# Patient Record
Sex: Male | Born: 1935
Health system: Southern US, Community
[De-identification: ages and names within clinical notes are randomized; demographics above are authoritative.]

## PROBLEM LIST (undated history)

## (undated) DIAGNOSIS — N183 Chronic kidney disease, stage 3 unspecified: Secondary | ICD-10-CM

## (undated) DIAGNOSIS — Z923 Personal history of irradiation: Secondary | ICD-10-CM

## (undated) DIAGNOSIS — I10 Essential (primary) hypertension: Secondary | ICD-10-CM

## (undated) DIAGNOSIS — C3490 Malignant neoplasm of unspecified part of unspecified bronchus or lung: Secondary | ICD-10-CM

## (undated) DIAGNOSIS — F0390 Unspecified dementia without behavioral disturbance: Secondary | ICD-10-CM

## (undated) DIAGNOSIS — E7800 Pure hypercholesterolemia, unspecified: Secondary | ICD-10-CM

## (undated) DIAGNOSIS — C61 Malignant neoplasm of prostate: Secondary | ICD-10-CM

## (undated) DIAGNOSIS — K5730 Diverticulosis of large intestine without perforation or abscess without bleeding: Secondary | ICD-10-CM

## (undated) HISTORY — DX: Diverticulosis of large intestine without perforation or abscess without bleeding: K57.30

## (undated) HISTORY — DX: Malignant neoplasm of prostate: C61

## (undated) HISTORY — DX: Personal history of irradiation: Z92.3

## (undated) HISTORY — DX: Essential (primary) hypertension: I10

## (undated) HISTORY — PX: REFRACTIVE SURGERY: SHX103

## (undated) HISTORY — PX: EYE SURGERY: SHX253

---

## 1999-12-27 DIAGNOSIS — C61 Malignant neoplasm of prostate: Secondary | ICD-10-CM

## 1999-12-27 HISTORY — DX: Malignant neoplasm of prostate: C61

## 2000-04-13 ENCOUNTER — Other Ambulatory Visit: Admission: RE | Admit: 2000-04-13 | Discharge: 2000-04-13 | Payer: Self-pay | Admitting: Urology

## 2000-04-27 ENCOUNTER — Ambulatory Visit (HOSPITAL_COMMUNITY): Admission: RE | Admit: 2000-04-27 | Discharge: 2000-04-27 | Payer: Self-pay | Admitting: Urology

## 2000-04-27 ENCOUNTER — Encounter: Admission: RE | Admit: 2000-04-27 | Discharge: 2000-04-27 | Payer: Self-pay | Admitting: Urology

## 2000-04-27 ENCOUNTER — Encounter: Payer: Self-pay | Admitting: Urology

## 2000-05-10 ENCOUNTER — Encounter: Admission: RE | Admit: 2000-05-10 | Discharge: 2000-08-08 | Payer: Self-pay | Admitting: Family Medicine

## 2000-07-26 ENCOUNTER — Encounter: Admission: RE | Admit: 2000-07-26 | Discharge: 2000-07-26 | Payer: Self-pay | Admitting: Urology

## 2000-07-26 ENCOUNTER — Encounter: Payer: Self-pay | Admitting: Urology

## 2000-07-26 HISTORY — PX: INSERTION PROSTATE RADIATION SEED: SUR718

## 2000-08-01 ENCOUNTER — Encounter: Payer: Self-pay | Admitting: Urology

## 2000-08-01 ENCOUNTER — Ambulatory Visit (HOSPITAL_BASED_OUTPATIENT_CLINIC_OR_DEPARTMENT_OTHER): Admission: RE | Admit: 2000-08-01 | Discharge: 2000-08-01 | Payer: Self-pay | Admitting: Urology

## 2000-08-29 ENCOUNTER — Encounter: Admission: RE | Admit: 2000-08-29 | Discharge: 2000-11-27 | Payer: Self-pay | Admitting: Radiation Oncology

## 2002-02-23 HISTORY — PX: PENILE PROSTHESIS IMPLANT: SHX240

## 2002-03-13 ENCOUNTER — Encounter: Payer: Self-pay | Admitting: Urology

## 2002-03-19 ENCOUNTER — Ambulatory Visit (HOSPITAL_COMMUNITY): Admission: RE | Admit: 2002-03-19 | Discharge: 2002-03-19 | Payer: Self-pay | Admitting: Urology

## 2003-05-15 ENCOUNTER — Encounter: Admission: RE | Admit: 2003-05-15 | Discharge: 2003-05-15 | Payer: Self-pay | Admitting: Urology

## 2003-05-15 ENCOUNTER — Encounter: Payer: Self-pay | Admitting: Urology

## 2003-05-20 ENCOUNTER — Ambulatory Visit (HOSPITAL_BASED_OUTPATIENT_CLINIC_OR_DEPARTMENT_OTHER): Admission: RE | Admit: 2003-05-20 | Discharge: 2003-05-20 | Payer: Self-pay | Admitting: Urology

## 2003-06-26 HISTORY — PX: PENILE PROSTHESIS PLACEMENT: SHX739

## 2003-06-26 HISTORY — PX: SCROTAL SURGERY: SHX2387

## 2003-06-26 HISTORY — PX: REMOVAL OF PENILE PROSTHESIS: SHX6059

## 2003-07-15 ENCOUNTER — Encounter (INDEPENDENT_AMBULATORY_CARE_PROVIDER_SITE_OTHER): Payer: Self-pay

## 2003-07-15 ENCOUNTER — Observation Stay (HOSPITAL_COMMUNITY): Admission: RE | Admit: 2003-07-15 | Discharge: 2003-07-16 | Payer: Self-pay | Admitting: Urology

## 2006-01-05 ENCOUNTER — Encounter: Payer: Self-pay | Admitting: Internal Medicine

## 2006-01-05 ENCOUNTER — Ambulatory Visit: Payer: Self-pay

## 2006-11-23 ENCOUNTER — Ambulatory Visit: Payer: Self-pay | Admitting: Internal Medicine

## 2007-01-04 ENCOUNTER — Ambulatory Visit: Payer: Self-pay | Admitting: Internal Medicine

## 2007-05-07 ENCOUNTER — Ambulatory Visit: Payer: Self-pay | Admitting: Internal Medicine

## 2007-05-07 LAB — CONVERTED CEMR LAB
ALT: 16 units/L (ref 0–40)
AST: 19 units/L (ref 0–37)
Albumin: 3.8 g/dL (ref 3.5–5.2)
Alkaline Phosphatase: 54 units/L (ref 39–117)
BUN: 9 mg/dL (ref 6–23)
Bilirubin, Direct: 0.1 mg/dL (ref 0.0–0.3)
CO2: 28 meq/L (ref 19–32)
Calcium: 9.2 mg/dL (ref 8.4–10.5)
Chloride: 113 meq/L — ABNORMAL HIGH (ref 96–112)
Creatinine, Ser: 1.1 mg/dL (ref 0.4–1.5)
GFR calc Af Amer: 86 mL/min
GFR calc non Af Amer: 71 mL/min
Glucose, Bld: 93 mg/dL (ref 70–99)
Potassium: 4.4 meq/L (ref 3.5–5.1)
Sodium: 144 meq/L (ref 135–145)
Total Bilirubin: 0.6 mg/dL (ref 0.3–1.2)
Total Protein: 7.1 g/dL (ref 6.0–8.3)

## 2007-07-25 DIAGNOSIS — Z8546 Personal history of malignant neoplasm of prostate: Secondary | ICD-10-CM | POA: Insufficient documentation

## 2007-07-25 DIAGNOSIS — I10 Essential (primary) hypertension: Secondary | ICD-10-CM | POA: Insufficient documentation

## 2007-07-25 DIAGNOSIS — K5730 Diverticulosis of large intestine without perforation or abscess without bleeding: Secondary | ICD-10-CM | POA: Insufficient documentation

## 2007-07-25 HISTORY — DX: Diverticulosis of large intestine without perforation or abscess without bleeding: K57.30

## 2007-08-07 ENCOUNTER — Ambulatory Visit: Payer: Self-pay | Admitting: Internal Medicine

## 2007-08-07 DIAGNOSIS — B356 Tinea cruris: Secondary | ICD-10-CM | POA: Insufficient documentation

## 2007-11-06 ENCOUNTER — Ambulatory Visit: Payer: Self-pay | Admitting: Internal Medicine

## 2008-03-10 ENCOUNTER — Encounter: Payer: Self-pay | Admitting: Internal Medicine

## 2008-07-14 ENCOUNTER — Telehealth: Payer: Self-pay | Admitting: Internal Medicine

## 2008-07-23 ENCOUNTER — Ambulatory Visit: Payer: Self-pay | Admitting: Internal Medicine

## 2008-08-04 ENCOUNTER — Ambulatory Visit: Payer: Self-pay | Admitting: Internal Medicine

## 2008-08-04 ENCOUNTER — Telehealth: Payer: Self-pay | Admitting: Internal Medicine

## 2008-08-04 DIAGNOSIS — M109 Gout, unspecified: Secondary | ICD-10-CM

## 2008-09-08 ENCOUNTER — Telehealth: Payer: Self-pay | Admitting: Internal Medicine

## 2008-10-07 ENCOUNTER — Ambulatory Visit: Payer: Self-pay | Admitting: Internal Medicine

## 2009-01-07 ENCOUNTER — Ambulatory Visit: Payer: Self-pay | Admitting: Internal Medicine

## 2009-09-15 ENCOUNTER — Encounter: Payer: Self-pay | Admitting: Internal Medicine

## 2009-12-11 ENCOUNTER — Encounter (INDEPENDENT_AMBULATORY_CARE_PROVIDER_SITE_OTHER): Payer: Self-pay | Admitting: *Deleted

## 2010-02-02 ENCOUNTER — Ambulatory Visit: Payer: Self-pay | Admitting: Internal Medicine

## 2010-02-02 DIAGNOSIS — H9209 Otalgia, unspecified ear: Secondary | ICD-10-CM | POA: Insufficient documentation

## 2010-04-05 ENCOUNTER — Ambulatory Visit: Payer: Self-pay | Admitting: Internal Medicine

## 2010-08-12 ENCOUNTER — Ambulatory Visit: Payer: Self-pay | Admitting: Internal Medicine

## 2010-08-12 DIAGNOSIS — N529 Male erectile dysfunction, unspecified: Secondary | ICD-10-CM | POA: Insufficient documentation

## 2010-12-09 ENCOUNTER — Ambulatory Visit: Payer: Self-pay | Admitting: Internal Medicine

## 2010-12-09 DIAGNOSIS — D234 Other benign neoplasm of skin of scalp and neck: Secondary | ICD-10-CM

## 2010-12-09 DIAGNOSIS — M109 Gout, unspecified: Secondary | ICD-10-CM

## 2010-12-09 LAB — CONVERTED CEMR LAB
BUN: 9 mg/dL (ref 6–23)
Basophils Absolute: 0 10*3/uL (ref 0.0–0.1)
Basophils Relative: 0.7 % (ref 0.0–3.0)
CO2: 27 meq/L (ref 19–32)
Calcium: 9.1 mg/dL (ref 8.4–10.5)
Chloride: 110 meq/L (ref 96–112)
Creatinine, Ser: 1 mg/dL (ref 0.4–1.5)
Eosinophils Absolute: 0.2 10*3/uL (ref 0.0–0.7)
Eosinophils Relative: 2.2 % (ref 0.0–5.0)
GFR calc non Af Amer: 90.18 mL/min (ref >60.00)
Glucose, Bld: 84 mg/dL (ref 70–99)
HCT: 40.9 % (ref 39.0–52.0)
Hemoglobin: 13.7 g/dL (ref 13.0–17.0)
Lymphocytes Relative: 32.4 % (ref 12.0–46.0)
Lymphs Abs: 2.2 10*3/uL (ref 0.7–4.0)
MCHC: 33.5 g/dL (ref 30.0–36.0)
MCV: 88.2 fL (ref 78.0–100.0)
Monocytes Absolute: 0.4 10*3/uL (ref 0.1–1.0)
Monocytes Relative: 5.9 % (ref 3.0–12.0)
Neutro Abs: 4 10*3/uL (ref 1.4–7.7)
Neutrophils Relative %: 58.8 % (ref 43.0–77.0)
Platelets: 212 10*3/uL (ref 150.0–400.0)
Potassium: 4.5 meq/L (ref 3.5–5.1)
RBC: 4.64 M/uL (ref 4.22–5.81)
RDW: 15.5 % — ABNORMAL HIGH (ref 11.5–14.6)
Sodium: 143 meq/L (ref 135–145)
Uric Acid, Serum: 6.8 mg/dL (ref 4.0–7.8)
WBC: 6.7 10*3/uL (ref 4.5–10.5)

## 2011-01-10 ENCOUNTER — Telehealth: Payer: Self-pay | Admitting: Internal Medicine

## 2011-01-10 DIAGNOSIS — R011 Cardiac murmur, unspecified: Secondary | ICD-10-CM | POA: Insufficient documentation

## 2011-01-18 ENCOUNTER — Telehealth: Payer: Self-pay | Admitting: Internal Medicine

## 2011-01-18 ENCOUNTER — Ambulatory Visit (HOSPITAL_COMMUNITY)
Admission: RE | Admit: 2011-01-18 | Discharge: 2011-01-18 | Payer: Self-pay | Source: Home / Self Care | Attending: Internal Medicine | Admitting: Internal Medicine

## 2011-01-18 ENCOUNTER — Ambulatory Visit: Admission: RE | Admit: 2011-01-18 | Discharge: 2011-01-18 | Payer: Self-pay | Source: Home / Self Care

## 2011-01-25 NOTE — Assessment & Plan Note (Signed)
Summary: 3 month rov/njr---PTS WIFE [facility] // RS   Vital Signs:  Patient profile:   75 year old male Height:      69 inches Weight:      230 pounds BMI:     34.09 Temp:     98.2 degrees F oral Pulse rate:   76 / minute Resp:     14 per minute BP sitting:   140 / 80  (left arm)  Vitals Entered By: Willy Eddy, LPN (August 12, 2010 10:12 AM)  Nutrition Counseling: Patient's BMI is greater than 25 and therefore counseled on weight management options. CC: roa- has lost 6 pounds, Hypertension Management Is Patient Diabetic? No  Does patient need assistance? Functional Status Self care Ambulation Normal   Primary Care Provider:  Stacie Glaze MD  CC:  roa- has lost 6 pounds and Hypertension Management.  History of Present Illness: weight down due to diet has been on a diet suppliemnt with aloe and protein and has has helped [employer] blood pressure monitering no chest pain of constipation increased pain in the left knee and has noted an varicose vein he has not been walking   Hypertension History:      He denies headache, chest pain, palpitations, dyspnea with exertion, orthopnea, PND, peripheral edema, visual symptoms, neurologic problems, syncope, and side effects from treatment.        Positive major cardiovascular risk factors include male age 58 years old or older and hypertension.  Negative major cardiovascular risk factors include non-tobacco-user status.     Preventive Screening-Counseling & Management  Alcohol-Tobacco     Smoking Status: quit     Tobacco Counseling: to remain off tobacco products  Problems Prior to Update: 1)  Organic Impotence  (ICD-607.84) 2)  Ear Pain, Left  (ICD-388.70) 3)  Gouty Arthropathy  (ICD-274.0) 4)  Dermatophytosis, Groin  (ICD-110.3) 5)  Family History of Alcoholism/addiction  (ICD-V61.41) 6)  Hypertension  (ICD-401.9) 7)  Diverticulosis, Colon  (ICD-562.10) 8)  Prostate Cancer, Hx of  (ICD-V10.46)  Current  Problems (verified): 1)  Ear Pain, Left  (ICD-388.70) 2)  Gouty Arthropathy  (ICD-274.0) 3)  Dermatophytosis, Groin  (ICD-110.3) 4)  Family History of Alcoholism/addiction  (ICD-V61.41) 5)  Hypertension  (ICD-401.9) 6)  Diverticulosis, Colon  (ICD-562.10) 7)  Prostate Cancer, Hx of  (ICD-V10.46)  Medications Prior to Update: 1)  Vesicare 10 Mg Tabs (Solifenacin Succinate) .Marland Kitchen.. 1 Once Daily 2)  Azor 5-40 Mg Tabs (Amlodipine-Olmesartan) .... One By Mouth Daily  Current Medications (verified): 1)  Vesicare 10 Mg Tabs (Solifenacin Succinate) .Marland Kitchen.. 1 Once Daily On and Off 2)  Azor 5-40 Mg Tabs (Amlodipine-Olmesartan) .... One By Mouth Daily  Allergies (verified): No Known Drug Allergies  Past History:  Family History: Last updated: 08/07/2007 father cirrhosis Family History of Alcoholism/Addiction mother diesd at 65  Social History: Last updated: 08/07/2007 Occupation: Married Former Smoker quit greater than 20 Alcohol use-no Drug use-no  Risk Factors: Smoking Status: quit (08/12/2010)  Past medical, surgical, family and social histories (including risk factors) reviewed, and no changes noted (except as noted below).  Past Medical History: Reviewed history from 11/06/2007 and no changes required. Prostate cancer, hx of Diverticulosis, colon  extensive Hypertension seeing Dr Logan Bores for hx of prostate CA  Past Surgical History: Reviewed history from 08/07/2007 and no changes required. Prostatectomy seeds pump implant  Family History: Reviewed history from 08/07/2007 and no changes required. father cirrhosis Family History of Alcoholism/Addiction mother diesd at 18  Social History:  Reviewed history from 08/07/2007 and no changes required. Occupation: Married Former Smoker quit greater than 20 Alcohol use-no Drug use-no  Review of Systems  The patient denies anorexia, fever, weight loss, weight gain, vision loss, decreased hearing, hoarseness, chest pain,  syncope, dyspnea on exertion, peripheral edema, prolonged cough, headaches, hemoptysis, abdominal pain, melena, hematochezia, severe indigestion/heartburn, hematuria, incontinence, genital sores, muscle weakness, suspicious skin lesions, transient blindness, difficulty walking, depression, unusual weight change, abnormal bleeding, enlarged lymph nodes, angioedema, and breast masses.    Physical Exam  General:  alert and overweight-appearing.   Head:  normocephalic and atraumatic.   Eyes:  pupils equal and pupils round.   Ears:  R ear normal and L ear normal.   Nose:  no external deformity and no nasal discharge.   Mouth:  pharynx pink and moist and no erythema.   Neck:  No deformities, masses, or tenderness noted no JVD. Lungs:  normal respiratory effort and no wheezes.   Heart:  normal rate and regular rhythm.   Abdomen:  soft and distended.     Impression & Recommendations:  Problem # 1:  HYPERTENSION (ICD-401.9)  His updated medication list for this problem includes:    Azor 5-40 Mg Tabs (Amlodipine-olmesartan) ..... One by mouth daily  BP today: 140/80 Prior BP: 140/78 (04/05/2010)  Prior 10 Yr Risk Heart Disease: Not enough information (11/06/2007)  Labs Reviewed: K+: 4.4 (05/07/2007) Creat: : 1.1 (05/07/2007)     Problem # 2:  GOUTY ARTHROPATHY (ICD-274.0)  Elevate extremity; warm compresses, symptomatic relief and medication as directed.   Problem # 3:  DIVERTICULOSIS, COLON (ICD-562.10) stable  Problem # 4:  ORGANIC IMPOTENCE (ICD-607.84)  due to surgery seeing Dr Cassell Smiles  Discussed proper use of medications, as well as side effects.   Complete Medication List: 1)  Vesicare 10 Mg Tabs (Solifenacin succinate) .Marland Kitchen.. 1 once daily on and off 2)  Azor 5-40 Mg Tabs (Amlodipine-olmesartan) .... One by mouth daily  Hypertension Assessment/Plan:      The patient's hypertensive risk group is category B: At least one risk factor (excluding diabetes) with no target  organ damage.  Today's blood pressure is 140/80.  His blood pressure goal is < 140/90.  Patient Instructions: 1)  Please schedule a follow-up appointment in 4 months.

## 2011-01-25 NOTE — Assessment & Plan Note (Signed)
Summary: partial loss of hearing in lft ear/?inf/drainage/cjr   Vital Signs:  Patient profile:   75 year old Jerry [name] Height:      69 inches Weight:      237 pounds BMI:     35.13 Temp:     98.2 degrees F oral Pulse rate:   76 / minute Resp:     14 per minute BP sitting:   140 / 82  (left arm)  Vitals Entered By: Willy Eddy, LPN (February  Jerry, 2011 3:55 PM) CC: c/o earache, Hypertension Management   CC:  c/o earache and Hypertension Management.  History of Present Illness: The pt has seen Dr Ermalene Postin and was given a trial  of a medication for BPH  ( vesicare) stoped due to the sign effect of blurred vision   Hypertension History:      He denies headache, chest pain, palpitations, dyspnea with exertion, orthopnea, PND, peripheral edema, visual symptoms, neurologic problems, syncope, and side effects from treatment.        Positive major cardiovascular risk factors include Jerry [name] age 6 years old or older and hypertension.  Negative major cardiovascular risk factors include non-tobacco-user status.     Preventive Screening-Counseling & Management  Alcohol-Tobacco     Smoking Status: quit  Problems Prior to Update: 1)  Gouty Arthropathy  (ICD-274.0) 2)  Dermatophytosis, Groin  (ICD-110.3) 3)  Family History of Alcoholism/addiction  (ICD-V61.41) 4)  Hypertension  (ICD-401.9) 5)  Diverticulosis, Colon  (ICD-562.10) 6)  Prostate Cancer, Hx of  (ICD-V10.46)  Current Problems (verified): 1)  Gouty Arthropathy  (ICD-274.0) 2)  Dermatophytosis, Groin  (ICD-110.3) 3)  Family History of Alcoholism/addiction  (ICD-V61.41) 4)  Hypertension  (ICD-401.9) 5)  Diverticulosis, Colon  (ICD-562.10) 6)  Prostate Cancer, Hx of  (ICD-V10.46)  Medications Prior to Update: 1)  Azor 5-20 Mg  Tabs (Amlodipine-Olmesartan) .... Once Daily  Current Medications (verified): 1)  Azor 5-20 Mg  Tabs (Amlodipine-Olmesartan) .... Once Daily 2)  Vesicare 10 Mg Tabs (Solifenacin Succinate) .Marland Kitchen.. 1  Once Daily  Allergies (verified): No Known Drug Allergies  Past History:  Family History: Last updated: 08/07/2007 father cirrhosis Family History of Alcoholism/Addiction mother diesd at 6  Social History: Last updated: 08/07/2007 Occupation: Married Former Smoker quit greater than 20 Alcohol use-no Drug use-no  Risk Factors: Smoking Status: quit (02/02/2010)  Past medical, surgical, family and social histories (including risk factors) reviewed, and no changes noted (except as noted below).  Past Medical History: Reviewed history from 11/06/2007 and no changes required. Prostate cancer, hx of Diverticulosis, colon  extensive Hypertension seeing Dr Logan Bores for hx of prostate CA  Past Surgical History: Reviewed history from 08/07/2007 and no changes required. Prostatectomy seeds pump implant  Family History: Reviewed history from 08/07/2007 and no changes required. father cirrhosis Family History of Alcoholism/Addiction mother diesd at 47  Social History: Reviewed history from 08/07/2007 and no changes required. Occupation: Married Former Smoker quit greater than 20 Alcohol use-no Drug use-no  Review of Systems  The patient denies anorexia, fever, weight loss, weight gain, vision loss, decreased hearing, hoarseness, chest pain, syncope, dyspnea on exertion, peripheral edema, prolonged cough, headaches, hemoptysis, abdominal pain, melena, hematochezia, severe indigestion/heartburn, hematuria, incontinence, genital sores, muscle weakness, suspicious skin lesions, transient blindness, difficulty walking, depression, unusual weight change, abnormal bleeding, enlarged lymph nodes, angioedema, and breast masses.         frequency  Physical Exam  General:  Well-developed,well-nourished,in no acute distress; alert,appropriate and cooperative throughout examination Head:  normocephalic.  atraumatic.   Eyes:  pupils equal and pupils round.   Ears:  R ear normal and  L ear normal.   Nose:  no external deformity and no nasal discharge.   Mouth:  pharynx pink and moist and no erythema.   Neck:  No deformities, masses, or tenderness noted no JVD. Lungs:  normal respiratory effort and no wheezes.  normal respiratory effort, no wheezes, and L fremitus.   Heart:  normal rate, regular rhythm, and Grade  1 /6 systolic ejection murmur.   Abdomen:  soft, non-tender, and distended.     Impression & Recommendations:  Problem # 1:  EAR PAIN, LEFT (ICD-388.70)  Discussed symptom control.   His updated medication list for this problem includes:    Cipro Hc 0.2-1 % Susp (Ciprofloxacin-hydrocortisone) .Marland KitchenMarland KitchenMarland KitchenMarland Kitchen 4 drops in the left ear two times a day or 10 days  Orders: Prescription Created Electronically [id number])  Problem # 2:  HYPERTENSION (ICD-401.9)  The following medications were removed from the medication list:    Azor 5-20 Mg Tabs (Amlodipine-olmesartan) ..... Once daily His updated medication list for this problem includes:    Azor 5-40 Mg Tabs (Amlodipine-olmesartan) ..... One by mouth daily  BP today: 140/82 Prior BP: 138/78 (01/07/2009)  Prior 10 Yr Risk Heart Disease: Not enough information (11/06/2007)  Labs Reviewed: K+: 4.4 (05/07/2007) Creat: : 1.1 (05/07/2007)     Complete Medication List: 1)  Vesicare 10 Mg Tabs (Solifenacin succinate) .Marland Kitchen.. 1 once daily 2)  Cipro Hc 0.2-1 % Susp (Ciprofloxacin-hydrocortisone) .... 4 drops in the left ear two times a day or 10 days 3)  Azor 5-40 Mg Tabs (Amlodipine-olmesartan) .... One by mouth daily  Hypertension Assessment/Plan:      The patient's hypertensive risk group is category B: At least one risk factor (excluding diabetes) with no target organ damage.  Today's blood pressure is 140/82.  His blood pressure goal is < 140/90.  Patient Instructions: 1)  Please schedule a follow-up appointment in 2 months. Prescriptions: CIPRO HC 0.2-1 % SUSP (CIPROFLOXACIN-HYDROCORTISONE) 4 drops in the left ear  two times a day or 10 days  #1 vial x 0   Entered and Authorized by:   Stacie Glaze MD   Signed by:   Stacie Glaze MD on 02/02/2010   Method used:   Electronically to        [facility] [id number] (retail)       [address] [place], [place]  [address]       Ph: [phone]       Fax: [phone]   RxID:   [id number]

## 2011-01-25 NOTE — Assessment & Plan Note (Signed)
Summary: 2 mo rov/mm   Vital Signs:  Patient profile:   75 year old male Height:      69 inches Weight:      236 pounds BMI:     34.98 Temp:     98.2 degrees F oral Pulse rate:   76 / minute Pulse rhythm:   regular Resp:     14 per minute BP sitting:   140 / 78  (left arm)  Vitals Entered By: Willy Eddy, LPN (April 05, 2010 12:08 PM)  Nutrition Counseling: Patient's BMI is greater than 25 and therefore counseled on weight management options. CC: roa-bo check- vesicare helped some pt states, Hypertension Management   CC:  roa-bo check- vesicare helped some pt states and Hypertension Management.  History of Present Illness: follow up for htn  Hypertension History:      He denies headache, chest pain, palpitations, dyspnea with exertion, orthopnea, PND, peripheral edema, visual symptoms, neurologic problems, syncope, and side effects from treatment.        Positive major cardiovascular risk factors include male age 87 years old or older and hypertension.  Negative major cardiovascular risk factors include non-tobacco-user status.     Preventive Screening-Counseling & Management  Alcohol-Tobacco     Smoking Status: quit  Current Medications (verified): 1)  Vesicare 10 Mg Tabs (Solifenacin Succinate) .Marland Kitchen.. 1 Once Daily 2)  Azor 5-40 Mg Tabs (Amlodipine-Olmesartan) .... One By Mouth Daily  Allergies (verified): No Known Drug Allergies  Past History:  Family History: Last updated: 08/07/2007 father cirrhosis Family History of Alcoholism/Addiction mother diesd at 56  Social History: Last updated: 08/07/2007 Occupation: Married Former Smoker quit greater than 20 Alcohol use-no Drug use-no  Risk Factors: Smoking Status: quit (04/05/2010)  Past medical, surgical, family and social histories (including risk factors) reviewed, and no changes noted (except as noted below).  Past Medical History: Reviewed history from 11/06/2007 and no changes  required. Prostate cancer, hx of Diverticulosis, colon  extensive Hypertension seeing Dr Logan Bores for hx of prostate CA  Past Surgical History: Reviewed history from 08/07/2007 and no changes required. Prostatectomy seeds pump implant  Family History: Reviewed history from 08/07/2007 and no changes required. father cirrhosis Family History of Alcoholism/Addiction mother diesd at 46  Social History: Reviewed history from 08/07/2007 and no changes required. Occupation: Married Former Smoker quit greater than 20 Alcohol use-no Drug use-no  Review of Systems       The patient complains of weight gain.  The patient denies anorexia, fever, weight loss, vision loss, decreased hearing, hoarseness, chest pain, syncope, dyspnea on exertion, peripheral edema, prolonged cough, headaches, hemoptysis, abdominal pain, melena, hematochezia, severe indigestion/heartburn, hematuria, incontinence, genital sores, muscle weakness, suspicious skin lesions, transient blindness, difficulty walking, depression, unusual weight change, abnormal bleeding, enlarged lymph nodes, angioedema, and breast masses.    Physical Exam  General:  alert and overweight-appearing.   Head:  normocephalic and atraumatic.   Eyes:  pupils equal and pupils round.   Ears:  R ear normal and L ear normal.   Nose:  no external deformity and no nasal discharge.   Lungs:  normal respiratory effort and no wheezes.   Heart:  normal rate and regular rhythm.   Abdomen:  soft and distended.   Msk:  no joint tenderness and no joint swelling.   Neurologic:  alert & oriented X3 and gait normal.     Impression & Recommendations:  Problem # 1:  HYPERTENSION (ICD-401.9) to help him with medications sample were given  blood presure is in well controll His updated medication list for this problem includes:    Azor 5-40 Mg Tabs (Amlodipine-olmesartan) ..... One by mouth daily  BP today: 140/78 Prior BP: 140/82 (02/02/2010)  Prior 10  Yr Risk Heart Disease: Not enough information (11/06/2007)  Labs Reviewed: K+: 4.4 (05/07/2007) Creat: : 1.1 (05/07/2007)     Problem # 2:  EAR PAIN, LEFT (ICD-388.70) the symptoms have resolved The following medications were removed from the medication list:    Cipro Hc 0.2-1 % Susp (Ciprofloxacin-hydrocortisone) .Marland KitchenMarland KitchenMarland KitchenMarland Kitchen 4 drops in the left ear two times a day or 10 days  Discussed symptom control.   Problem # 3:  PROSTATE CANCER, HX OF (ICD-V10.46) he does not want follow up psa's  Complete Medication List: 1)  Vesicare 10 Mg Tabs (Solifenacin succinate) .Marland Kitchen.. 1 once daily 2)  Azor 5-40 Mg Tabs (Amlodipine-olmesartan) .... One by mouth daily  Hypertension Assessment/Plan:      The patient's hypertensive risk group is category B: At least one risk factor (excluding diabetes) with no target organ damage.  Today's blood pressure is 140/78.  His blood pressure goal is < 140/90.  Patient Instructions: 1)  Please schedule a follow-up appointment in 3 months.
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
## 2011-01-27 NOTE — Assessment & Plan Note (Signed)
Summary: 4 month fup//ccm   Vital Signs:  Patient profile:   75 year old male Height:      69 inches Weight:      232 pounds BMI:     34.38 Temp:     98.2 degrees F oral Pulse rate:   76 / minute Resp:     14 per minute BP sitting:   144 / 80  (left arm)  Vitals Entered By: Willy Eddy, LPN (December 09, 2010 9:50 AM) CC: roa Is Patient Diabetic? No   CC:  roa.  Preventive Screening-Counseling & Management  Alcohol-Tobacco     Smoking Status: quit     Tobacco Counseling: to remain off tobacco products  Current Problems (verified): 1)  Organic Impotence  (ICD-607.84) 2)  Ear Pain, Left  (ICD-388.70) 3)  Gouty Arthropathy  (ICD-274.0) 4)  Dermatophytosis, Groin  (ICD-110.3) 5)  Family History of Alcoholism/addiction  (ICD-V61.41) 6)  Hypertension  (ICD-401.9) 7)  Diverticulosis, Colon  (ICD-562.10) 8)  Prostate Cancer, Hx of  (ICD-V10.46)  Current Medications (verified): 1)  Vesicare 10 Mg Tabs (Solifenacin Succinate) .Marland Kitchen.. 1 Once Daily On and Off 2)  Azor 5-40 Mg Tabs (Amlodipine-Olmesartan) .... One By Mouth Daily  Allergies (verified): No Known Drug Allergies  Review of Systems       Flu Vaccine Consent Questions     Do you have a history of severe allergic reactions to this vaccine? no    Any prior history of allergic reactions to egg and/or gelatin? no    Do you have a sensitivity to the preservative Thimersol? no    Do you have a past history of Guillan-Barre Syndrome? no    Do you currently have an acute febrile illness? no    Have you ever had a severe reaction to latex? no    Vaccine information given and explained to patient? yes    Are you currently pregnant? no    Lot Number:[id number]   Exp Date:06/25/2011   Site Given  Left Deltoid IM   Physical Exam  General:  alert and overweight-appearing.   Head:  normocephalic and atraumatic.   Eyes:  pupils equal and pupils round.   Ears:  R ear normal and L ear normal.   Mouth:  pharynx pink and  moist and no erythema.   Neck:  No deformities, masses, or tenderness noted no JVD. Lungs:  normal respiratory effort and no wheezes.   Heart:  normal rate and regular rhythm.   Abdomen:  soft and distended.   Msk:  no joint tenderness and no joint swelling.   Extremities:  trace left pedal edema and trace right pedal edema.   Neurologic:  alert & oriented X3 and gait normal.  RUE hyperreflexia.     Impression & Recommendations:  Problem # 1:  ORGANIC IMPOTENCE (ICD-607.84)  Discussed proper use of medications, as well as side effects.   Problem # 2:  GOUTY ARTHROPATHY (ICD-274.0) rare gout issues promarily in the right knee no foot ot toe pain noted Elevate extremity; warm compresses, symptomatic relief and medication as directed.   Problem # 3:  HYPERTENSION (ICD-401.9)  stable His updated medication list for this problem includes:    Azor 5-40 Mg Tabs (Amlodipine-olmesartan) ..... One by mouth daily  BP today: 144/80 repeat 1 Prior BP: 140/80 (08/12/2010)  Prior 10 Yr Risk Heart Disease: Not enough information (11/06/2007)  Labs Reviewed: K+: 4.4 (05/07/2007) Creat: : 1.1 (05/07/2007)     Orders: Venipuncture (72536) TLB-BMP (  Basic Metabolic Panel-BMET) (80048-METABOL)  Problem # 4:  DERMATOPHYTOSIS, GROIN (ICD-110.3) lamisol otc  Problem # 5:  HYPERTENSION (ICD-401.9)  His updated medication list for this problem includes:    Azor 5-40 Mg Tabs (Amlodipine-olmesartan) ..... One by mouth daily  Orders: Venipuncture (11914) TLB-BMP (Basic Metabolic Panel-BMET) (80048-METABOL)  Complete Medication List: 1)  Vesicare 10 Mg Tabs (Solifenacin succinate) .Marland Kitchen.. 1 once daily on and off 2)  Azor 5-40 Mg Tabs (Amlodipine-olmesartan) .... One by mouth daily  Other Orders: Flu Vaccine 42yrs + MEDICARE PATIENTS (N8295) Administration Flu vaccine - MCR (G0008) TLB-CBC Platelet - w/Differential (85025-CBCD) TLB-Uric Acid, Blood (84550-URIC) Dermatology Referral  (Derma)  Patient Instructions: 1)  for rashes lamisil cream or lotion 2)  Please schedule a follow-up appointment in 4 months.   Orders Added: 1)  Flu Vaccine 72yrs + MEDICARE PATIENTS [Q2039] 2)  Administration Flu vaccine - MCR [G0008] 3)  Est. Patient Level IV [62130] 4)  Venipuncture [36415] 5)  TLB-BMP (Basic Metabolic Panel-BMET) [80048-METABOL] 6)  TLB-CBC Platelet - w/Differential [85025-CBCD] 7)  TLB-Uric Acid, Blood [84550-URIC] 8)  Dermatology Referral [Derma]

## 2011-01-27 NOTE — Progress Notes (Signed)
Summary: Pt needs a letter written for his job re: pts heart murmur  Phone Note Call from Patient Call back at [phone] cell   Caller: Patient Reason for Call: Acute Illness Summary of Call: Pt just had a dot cpx done by [facility] on [address] Need to get a letter from Dr Lovell Sheehan, stating that pts heart murmur will not interfere with pts job. Pt has had a heart murmur for many years and has never had any problems. Pls advise.  Initial call taken by: Lucy Antigua,  January 10, 2011 10:56 AM  Follow-up for Phone Call        per dr Esperanza Heir- need echo first  left message on machine order sent Follow-up by: Willy Eddy, LPN,  January 10, 2011 11:44 AM  New Problems: CARDIAC MURMUR (ICD-785.2)   New Problems: CARDIAC MURMUR (ICD-785.2)

## 2011-05-13 NOTE — Op Note (Signed)
[facility]  Patient:    Jerry [name], Jerry [name]                     MRN: [id number] Proc. Date: 08/01/00 Adm. Date:  [id number] Attending:  Londell Moh CC:         Maryln Gottron, M.D.             Stacie Glaze, M.D. [facility]                           Operative Report  SERVICE:  Urology.  PREOPERATIVE DIAGNOSIS:  Carcinoma of the prostate.  POSTOPERATIVE DIAGNOSIS:   Carcinoma of the prostate.  PROCEDURE:  Radioactive seed implantation, cystoscopy.  SURGEON:  Dr. Logan Bores.  RADIATION ONCOLOGIST:  Dr. Dayton Scrape.  ANESTHESIA:  General.  COMPLICATIONS:  None.  DRAINS:  16 French Foley catheter.  BRIEF HISTORY:  A 75 year old male had an elevated PSA and underwent a prostatic ultrasound guided biopsy and was found to have carcinoma of the prostate. He was advised as to the various treatment options and elected combination therapy with external beam radiation therapy followed by seed implantation. He has completed his course of external beam without complications and is scheduled to undergo seed implantation. The patient understands the risks and benefits of the procedure and gave full and informed consent. Specifically, we did discuss the possible side effects including urgency, frequency, dysuria, hematuria, radiation proctitis, difficulty with voiding and increasing problems with erections.  DESCRIPTION OF PROCEDURE:  After successful induction of general anesthesia, the patient was placed in the dorsal lithotomy position and prepped with Betadine and draped in the usual sterile fashion. The bed was adjusted so that the images obtained with the transrectal ultrasound were identical to those obtained in the preplanning session. Once the stabilized system was attached to the bed, the patient was reimaged and it appeared that he was in appropriate position. ______ were placed. The patient then underwent standard seed implantation following the  preplanned template. The patient then underwent a cystoscopic examination and there was no evidence of any seeds within the bladder. There was no evidence of an injury to the bladder or prostatic mucosa. The patient had a new Foley catheter inserted. This was placed to straight drain. The patient tolerated the procedure well and was taken to the recovery room in good condition to be sent home with a leg bag with prescriptions for Lorcet plus, Pyridium and Septra DS and return to see [place] in the office in 24 hours and will take his Foley catheter out at that time. He will follow-up in 1 week and in several weeks his first PSA check will be obtained. DD:  08/01/00 TD:  08/02/00 Job: [id number]

## 2011-05-13 NOTE — Op Note (Signed)
[facility]  Patient:    LYNDON, CHAPEL Visit Number: [id number] MRN: [id number]          Service Type: DSU Location: DAY Attending Physician:  Londell Moh Dictated by:   Jamison Neighbor, M.D. Proc. Date: 03/19/02 Admit Date:  03/19/2002   CC:         [facility]   Operative Report  PREOPERATIVE DIAGNOSIS:  Organic erectile dysfunction.  POSTOPERATIVE DIAGNOSIS:  Organic erectile dysfunction.  PROCEDURE:  Insertion of Mentor three piece inflatable penile prosthesis.  SURGEON:  Jamison Neighbor, M.D.  ANESTHESIA:  General.  COMPLICATIONS:  None.  DRAINS:  None.  BRIEF HISTORY:  This 75 year old male has known erectile dysfunction felt to be due primarily to vascular disease.  The patient recently had radiation seed implantation performed for carcinoma of the prostate which certainly did not help the vascular flow to his penis.  The patient has been appraised to the various treatment options.  He has elected to undergo insertion of a penile prosthesis.  He has looked at various devices available from both Mentor and [employer], and he elected to have a three piece device inserted.  The patient selected the Mentor Titan three-piece inflatable device.  He understands the risks and benefits of the procedure including the possibility of erosion, infection, mechanical breakdown, or injury to other organs including the bladder and vascular structures.  The patient gave full and informed consent.  DESCRIPTION OF PROCEDURE:  After the successful induction of general anesthesia, the patient was placed in the supine position.  Because of the patients large abdominal wall, tape was used to pull the fat off the top of the penis to improve exposure to the infrapubic area.  He was then placed in a modified Trendelenburg position with flexing of the bed in order to improve exposure.  The patient had a full 10 minute  prep with scrub and paint.  A curvilinear infrapubic incision was made, and this was carried down through the Scarpas fascia.  Blunt dissection was used to expose the corporal bodies. A Foley catheter was placed to help identify the urethra.  Holding sutures of 2-0 Vicryl were placed, and corporotomies were made bilaterally.  The corpora was dilated up to #14 with [facility] dilators on each side.  The corpora was measured, and it was felt that an 18 cm device with approximately 3 cm of rear tip would be appropriate.  It appeared that 21 cm was the entire penile length.  The patient had sutures placed in the corporotomy on each side, and these were clamped in preparation for eventual closure.  While the implant cell was being prepared, the reservoir pocket was created.  The fascia overlying the rectus was identified.  An opening was made in the rectus sheath.  Blunt dissection was used to make an opening just behind the pubic bone.  Blunt dissection was used to create a space.  The 75 cm reservoir was placed into this space and then overfilled to a total of 120.  This was done in order to ensure that there would be adequate space for the reservoir.  The device was then inserted using the [facility] tool.  This was an 18 cm device with a 3 cm rear tip.  When this was inflated, there was good filling out at least to the mid glans area, and there was no wrinkling at the corporotomy site. The entire area was then irrigated with antibiotic solution, and the  corporotomy was closed with the previously-placed sutures.  The pump mechanism was then placed within a pocket in the right scrotum and held in place with a Babcock clamp.  The reservoir was then emptied down to a total of 85 cc.  It was felt that the patient required slightly more than the standard 75 cc since he took at least 45 cc or so with filling of the tubes.  The True/Lok system was then used to make the connections.  The device was then  cycled two more times, and it appeared to function normally with a good straight perfectly functional penis.  The entire area was irrigated one more time with antibiotic solution.  The patient was then closed with two layers of 2-0 Vicryl and surgical clips.  A dressing was applied to the infrapubic incision.  A penile dressing of dry gauze and Coban was placed.  The Foley catheter was removed in the recovery room.  The patient will be discharged home with a prescription for Tylox and Keflex if he voids.  If he does not void, he will be sent home for 24 hours with a Foley catheter. Dictated by:   Jamison Neighbor, M.D. Attending Physician:  Londell Moh DD:  03/19/02 TD:  03/19/02 Job: [id number]

## 2011-05-13 NOTE — Op Note (Signed)
Jerry [name], Jerry [name]                        ACCOUNT NO.:  [id number]   MEDICAL RECORD NO.:  [id number]                   PATIENT TYPE:  AMB   LOCATION:  DAY                                  FACILITY:  [facility]   PHYSICIAN:  Jamison Neighbor, M.D.               DATE OF BIRTH:  08/29/1936   DATE OF PROCEDURE:  07/15/2003  DATE OF DISCHARGE:                                 OPERATIVE REPORT   PREOPERATIVE DIAGNOSIS:  Eroded scrotal pump, Mentor three-piece inflatable  penile prosthesis.   POSTOPERATIVE DIAGNOSIS:  Eroded scrotal pump, Mentor three-piece inflatable  penile prosthesis.   OPERATION/PROCEDURE:  1. Removal of three-piece Mentor prosthesis.  2. Placement of Mentor rod prosthesis.  3. Repair of scrotal defect.   SURGEON:  Jamison Neighbor, M.D.   ANESTHESIA:  General.   COMPLICATIONS:  None.   DRAINS:  16-French Foley catheter.   BRIEF HISTORY:  This 75 year old male underwent successful implantation of  three-piece penile prosthesis.  The patient returned to the office several  weeks ago noting that there was some thinning in the skin over top of the  scrotal pump and at that the pump was becoming tethered.  The patient was  scheduled to undergo pump revision with placement in a different portion of  the scrotum but anesthesia felt that he needed to undergo cardiac clearance.  During that time when his cardiac evaluation was being completed, the  patient began to notice blood in the scrotum.  He presented to the office  with that complaint and was noted at that time to have erosion of the  prosthesis.  What is of interest is the fact that the patient did not  realize that he was fully eroded even though the pump was outside of the  scrotum.  There was no signs of infection.  The patient stated that the  reason he could not tell that it was eroded is that he cannot see down there  due to his large abdominal size.  The decision was made to remove the  existing device  but because it appeared that there was no active infection,  it was thought that the patient might be able to have a salvage procedure  performed.  Because it is clear that he is really not using the pump on a  regular basis and just inflating the device, it was thought that a rod  prosthesis might make more sense for him.  The patient has agreed to try  this.  We will use the [facility] salvage protocol.  The patient understands  the risks and benefits of the procedure as well as the possibility that he  will need to have the rods removed at a later date.  He gave full and  informed consent.   DESCRIPTION OF PROCEDURE:  After successful induction of general anesthesia,  the patient was placed in the supine position in a slight Trendelenburg  to  pull his abdominal panniculus out of the way.  He was give a full 10-minute  scrub and then followed with Betadine paint.  Previously infrapubic incision  was opened and cautery incision was used to identify the tubing to the  reservoir as well as the tubing to each of the corporeal body and to the  pump.  The tubing through the corporeal body was traced down on both sides  until the spot where the tubing and a corpora could be seen.  Electrocautery  was used to open the corpora on both sides.  The entire device including  rear-tip extenders was removed.   The device was fine and it easily took a 13 mm Brooks dilator and when the  penis was put on straight elevation between a 20 and 21 cm device would be  the appropriate size.  The tubing down to the pump was identified.  Electrocautery was used the dissect this free and the pump was removed.  All  of the sinus tracks, particularly those tracts going down to the pump were  completely removed so that all fresh tissue was left in place. Attention was  then directed towards the reservoir.  Dissection proceeded all the way back  to the fascia.  The device was quite stuck and very difficult to remove  and  the decision was made to cut this off at the fascia level and leave the  original in place after it had been emptied.  It was felt that it would be  the safest thing to do, given the patient's extreme size and fear that this  would lead to bladder injury.  The patient then underwent the [facility]  salvage protocol.  He was irrigated with the hydroirrigation unit with the  following solutions:  1 L of antibiotic solution, 1 L of ____ strength  hydrogen peroxide, 1 L of dilute Betadine, 3 L of a gentamicin and  vancomycin solution, followed by additional liter of Betadine, an additional  liter of peroxide and a final liter of the antibiotic solution.  The tension  with the device was given both to the corporeal bodies themselves as well as  the scrotal defect.  The scrotal incision was then closed.  The skin was  freshened up.  Hemostasis obtained with the electrocautery.  This was closed  in two layers with 3-0 Vicryl.  The 13 mm Mentor device was then selected.  It was first trimmed so that it would be 21 cm length.  That was clearly too  long; 20 cm seemed to fit in fairly well but appeared still a little bit  long, 19.5 cm appeared to give a perfect fit.  There was no buckling and  appeared that it was a good straight prosthesis in place when the penis was  in the elevated position.  It seemed to bend warmly.  The corporeal bodies  were then closed with figure-of-eight sutures of 0 Vicryl.  After the  closure was obtained on both sides, there was no exposed prosthesis.  This  was a good tight closure.  The patient was irrigated one more time and  closed with 2-0 Vicryl and surgical clips.  The Foley catheter was left in  place.  The patient will be given ice to the groin.  He will be brought for  23-hour observation, in particular for antibiotic coverage.  The patient had  a dressing of Tegaderm placed on the incision and collodion on the scrotal  incision.  He will be sent home  tomorrow with antibiotic therapy and pain medicine and  return to the office for followup on a weekly basis until he is ready to  activate the prosthesis in approximately eight weeks.                                                Jamison Neighbor, M.D.    RJE/MEDQ  D:  07/15/2003  T:  07/15/2003  Job:  [id number]   cc:   [facility]

## 2011-05-19 ENCOUNTER — Encounter: Payer: Self-pay | Admitting: Internal Medicine

## 2011-06-01 ENCOUNTER — Encounter: Payer: Self-pay | Admitting: Internal Medicine

## 2011-06-01 ENCOUNTER — Ambulatory Visit (INDEPENDENT_AMBULATORY_CARE_PROVIDER_SITE_OTHER): Payer: Medicare Other | Admitting: Internal Medicine

## 2011-06-01 VITALS — BP 134/78 | HR 76 | Temp 98.2°F | Resp 16 | Ht 68.5 in | Wt 224.0 lb

## 2011-06-01 DIAGNOSIS — R011 Cardiac murmur, unspecified: Secondary | ICD-10-CM

## 2011-06-01 DIAGNOSIS — I10 Essential (primary) hypertension: Secondary | ICD-10-CM

## 2011-06-01 DIAGNOSIS — N8300 Follicular cyst of ovary, unspecified side: Secondary | ICD-10-CM

## 2011-06-01 DIAGNOSIS — M109 Gout, unspecified: Secondary | ICD-10-CM

## 2011-06-01 LAB — BASIC METABOLIC PANEL
Calcium: 9 mg/dL (ref 8.4–10.5)
GFR: 74.2 mL/min (ref >60.00)
Potassium: 4 mEq/L (ref 3.5–5.1)
Sodium: 140 mEq/L (ref 135–145)

## 2011-06-01 MED ORDER — CEPHALEXIN 250 MG PO CAPS: 250.0000 mg | ORAL_CAPSULE | Freq: Four times a day (QID) | ORAL | Status: AC

## 2011-06-01 NOTE — Progress Notes (Signed)
  Subjective:    Patient ID: Jerry [name], male    DOB: 01-Mar-1936, 75 y.o.   MRN: [id number]  HPI Patient is a 75 year old Latin American male who presents for followup of hypertension a history of prostate cancer and gouty arthritis who presents with an acute complaint of an infected follicle underneath his left arm.  He has been treating this with alcohol he states that that has not increased in size but it is still present.  He also is followed for erectile dysfunction  We reviewed his decision that he did not want PSAs monitored   Review of Systems  Constitutional: Negative for fever and fatigue.  HENT: Negative for hearing loss, congestion, neck pain and postnasal drip.   Eyes: Negative for discharge, redness and visual disturbance.  Respiratory: Negative for cough, shortness of breath and wheezing.   Cardiovascular: Negative for leg swelling.  Gastrointestinal: Negative for abdominal pain, constipation and abdominal distention.  Genitourinary: Negative for urgency and frequency.  Musculoskeletal: Negative for joint swelling and arthralgias.  Skin: Positive for rash and wound. Negative for color change.  Neurological: Negative for weakness and light-headedness.  Hematological: Negative for adenopathy.  Psychiatric/Behavioral: Negative for behavioral problems.   Past Medical History  Diagnosis Date  . Cancer of prostate   . Diverticulosis   . Hypertension    Past Surgical History  Procedure Date  . Prostatectomy     seed implant  . Penile pump implant     reports that he quit smoking about 22 years ago. He does not have any smokeless tobacco history on file. He reports that he does not drink alcohol or use illicit drugs. family history includes Cirrhosis in an unspecified family member and Early death in his father. Not on File     Objective:   Physical Exam  Constitutional: He appears well-developed and well-nourished.  HENT:  Head: Normocephalic and  atraumatic.  Eyes: Conjunctivae are normal. Pupils are equal, round, and reactive to light.  Neck: Normal range of motion. Neck supple.  Cardiovascular: Normal rate and regular rhythm.   Pulmonary/Chest: Effort normal and breath sounds normal.  Abdominal: Soft. Bowel sounds are normal.  Skin:       2 cm ocular cyst underneath the left arm in the axilla          Assessment & Plan:  Treatment of the follicular cyst underneath his arm with Keflex 500 mg 4 times a day for 7 days and warm compresses.  Continue treatment of his blood pressure with amlodipine samples of Viagra for erectile dysfunction and treatment of his spastic bladder with VESIcare 10 mg by mouth day.

## 2011-11-24 ENCOUNTER — Ambulatory Visit: Payer: Medicare Other | Admitting: Internal Medicine

## 2011-12-05 ENCOUNTER — Ambulatory Visit (INDEPENDENT_AMBULATORY_CARE_PROVIDER_SITE_OTHER): Payer: Medicare Other | Admitting: Family Medicine

## 2011-12-05 ENCOUNTER — Encounter: Payer: Self-pay | Admitting: Family Medicine

## 2011-12-05 VITALS — BP 150/90 | HR 73 | Temp 98.1°F | Wt 224.0 lb

## 2011-12-05 DIAGNOSIS — J329 Chronic sinusitis, unspecified: Secondary | ICD-10-CM

## 2011-12-05 MED ORDER — SILDENAFIL CITRATE 100 MG PO TABS: 100.0000 mg | ORAL_TABLET | ORAL | Status: AC | PRN

## 2011-12-05 MED ORDER — AZITHROMYCIN 250 MG PO TABS: ORAL_TABLET | ORAL | Status: AC

## 2011-12-05 NOTE — Progress Notes (Signed)
  Subjective:    Patient ID: Jerry [name], male    DOB: 13-Oct-1936, 75 y.o.   MRN: [id number]  HPI Here for 2 days of stuffy head, PND, and a dry cough. No fever.   Review of Systems  Constitutional: Negative.   HENT: Positive for congestion, postnasal drip and sinus pressure.   Eyes: Negative.   Respiratory: Positive for cough.        Objective:   Physical Exam  Constitutional: He appears well-developed and well-nourished.  HENT:  Right Ear: External ear normal.  Left Ear: External ear normal.  Nose: Nose normal.  Mouth/Throat: Oropharynx is clear and moist. No oropharyngeal exudate.  Eyes: Conjunctivae are normal.  Neck: No thyromegaly present.  Pulmonary/Chest: Effort normal and breath sounds normal.  Lymphadenopathy:    He has no cervical adenopathy.          Assessment & Plan:  Recheck prn

## 2012-01-27 ENCOUNTER — Ambulatory Visit: Payer: Medicare Other | Admitting: Internal Medicine

## 2012-03-30 ENCOUNTER — Telehealth: Payer: Self-pay | Admitting: *Deleted

## 2012-03-30 NOTE — Telephone Encounter (Signed)
Ok

## 2012-03-30 NOTE — Telephone Encounter (Signed)
Patient is calling from [place].  His blood pressure is 141/108.  Patient would like to know if he should take half more tab daily for his blood pressure?

## 2012-03-30 NOTE — Telephone Encounter (Signed)
Patient is aware 

## 2012-07-04 ENCOUNTER — Encounter: Payer: Self-pay | Admitting: Internal Medicine

## 2012-07-04 ENCOUNTER — Ambulatory Visit (INDEPENDENT_AMBULATORY_CARE_PROVIDER_SITE_OTHER): Payer: Medicare Other | Admitting: Internal Medicine

## 2012-07-04 VITALS — BP 130/80 | HR 72 | Temp 98.6°F | Resp 16 | Ht 68.5 in | Wt 210.0 lb

## 2012-07-04 DIAGNOSIS — N529 Male erectile dysfunction, unspecified: Secondary | ICD-10-CM

## 2012-07-04 DIAGNOSIS — I10 Essential (primary) hypertension: Secondary | ICD-10-CM

## 2012-07-04 DIAGNOSIS — Z8546 Personal history of malignant neoplasm of prostate: Secondary | ICD-10-CM

## 2012-07-04 DIAGNOSIS — M109 Gout, unspecified: Secondary | ICD-10-CM

## 2012-07-04 MED ORDER — ATORVASTATIN CALCIUM 40 MG PO TABS: 40.0000 mg | ORAL_TABLET | Freq: Every day | ORAL | Status: DC

## 2012-07-04 NOTE — Progress Notes (Signed)
Subjective:    Patient ID: Jerry [name], male    DOB: 1936-07-01, 76 y.o.   MRN: [id number]  HPI Follow for prostate issus's... Has returned to Dr Logan Bores care Hx of HTN,  Hx of prostate cancer s/p prostatectomy Gout and impotence    Review of Systems  Constitutional: Negative for fever and fatigue.  HENT: Negative for hearing loss, congestion, neck pain and postnasal drip.   Eyes: Negative for discharge, redness and visual disturbance.  Respiratory: Negative for cough, shortness of breath and wheezing.   Cardiovascular: Negative for leg swelling.  Gastrointestinal: Negative for abdominal pain, constipation and abdominal distention.  Genitourinary: Negative for urgency and frequency.  Musculoskeletal: Negative for joint swelling and arthralgias.  Skin: Negative for color change and rash.  Neurological: Negative for weakness and light-headedness.  Hematological: Negative for adenopathy.  Psychiatric/Behavioral: Negative for behavioral problems.   Past Medical History  Diagnosis Date  . Cancer of prostate   . Diverticulosis   . Hypertension     History   Social History  . Marital Status: Divorced    Spouse Name: N/A    Number of Children: N/A  . Years of Education: N/A   Occupational History  . Not on file.   Social History Main Topics  . Smoking status: Former Smoker    Quit date: 12/26/1988  . Smokeless tobacco: Never Used  . Alcohol Use: No  . Drug Use: No  . Sexually Active: Not Currently   Other Topics Concern  . Not on file   Social History Narrative  . No narrative on file    Past Surgical History  Procedure Date  . Prostatectomy     seed implant  . Penile pump implant     Family History  Problem Relation Age of Onset  . Cirrhosis    . Early death Father     cirrhosis    No Known Allergies  Current Outpatient Prescriptions on File Prior to Visit  Medication Sig Dispense Refill  . amLODipine-olmesartan (AZOR) 5-40 MG per tablet Take 1  tablet by mouth daily.        . sildenafil (VIAGRA) 100 MG tablet Take 1 tablet (100 mg total) by mouth as needed for erectile dysfunction.  10 tablet  5  . solifenacin (VESICARE) 10 MG tablet Take 5 mg by mouth daily.        Marland Kitchen atorvastatin (LIPITOR) 40 MG tablet Take 1 tablet (40 mg total) by mouth daily.  90 tablet  3    BP 130/80  Pulse 72  Temp 98.6 F (37 C)  Resp 16  Ht 5' 8.5" (1.74 m)  Wt 210 lb (95.255 kg)  BMI 31.47 kg/m2       Objective:   Physical Exam  Constitutional: He appears well-developed and well-nourished.  HENT:  Head: Normocephalic and atraumatic.  Eyes: Conjunctivae are normal. Pupils are equal, round, and reactive to light.  Neck: Normal range of motion. Neck supple.  Cardiovascular: Normal rate and regular rhythm.   Murmur heard. Pulmonary/Chest: Effort normal and breath sounds normal.  Abdominal: Soft. Bowel sounds are normal.          Assessment & Plan:  Blood pressure is stable on current meds. Samples of Azor given to the patient.  History of impotence on by her on her milligrams when necessary.  That's he related to prostate surgery.  Patient has history of gout but has no recent outbreaks.  Patient has history of prostate cancer is now followed  by Dr. Logan Bores at [facility]  His history of cardiac murmur is stable is probably aortic insufficiency

## 2012-07-05 LAB — BASIC METABOLIC PANEL
Chloride: 109 mEq/L (ref 96–112)
GFR: 71.95 mL/min (ref >60.00)
Potassium: 4.3 mEq/L (ref 3.5–5.1)
Sodium: 139 mEq/L (ref 135–145)

## 2012-07-05 LAB — CALCIUM: Calcium: 8.8 mg/dL (ref 8.4–10.5)

## 2013-01-07 ENCOUNTER — Ambulatory Visit (INDEPENDENT_AMBULATORY_CARE_PROVIDER_SITE_OTHER): Payer: Medicare Other | Admitting: Internal Medicine

## 2013-01-07 ENCOUNTER — Encounter: Payer: Self-pay | Admitting: Internal Medicine

## 2013-01-07 VITALS — BP 135/70 | HR 72 | Temp 98.2°F | Resp 16 | Ht 68.5 in | Wt 206.0 lb

## 2013-01-07 DIAGNOSIS — M1A00X0 Idiopathic chronic gout, unspecified site, without tophus (tophi): Secondary | ICD-10-CM

## 2013-01-07 DIAGNOSIS — I10 Essential (primary) hypertension: Secondary | ICD-10-CM

## 2013-01-07 DIAGNOSIS — E785 Hyperlipidemia, unspecified: Secondary | ICD-10-CM

## 2013-01-07 DIAGNOSIS — M1A9XX0 Chronic gout, unspecified, without tophus (tophi): Secondary | ICD-10-CM

## 2013-01-07 DIAGNOSIS — T887XXA Unspecified adverse effect of drug or medicament, initial encounter: Secondary | ICD-10-CM

## 2013-01-07 LAB — LIPID PANEL
Cholesterol: 191 mg/dL (ref 0–200)
LDL Cholesterol: 141 mg/dL — ABNORMAL HIGH (ref 0–99)
Total CHOL/HDL Ratio: 6
VLDL: 18.6 mg/dL (ref 0.0–40.0)

## 2013-01-07 LAB — HEPATIC FUNCTION PANEL
AST: 17 U/L (ref 0–37)
Alkaline Phosphatase: 59 U/L (ref 39–117)
Bilirubin, Direct: 0 mg/dL (ref 0.0–0.3)
Total Bilirubin: 0.5 mg/dL (ref 0.3–1.2)

## 2013-01-07 LAB — BASIC METABOLIC PANEL
Chloride: 107 mEq/L (ref 96–112)
GFR: 84.04 mL/min (ref >60.00)
Potassium: 4.6 mEq/L (ref 3.5–5.1)
Sodium: 140 mEq/L (ref 135–145)

## 2013-01-07 LAB — URIC ACID: Uric Acid, Serum: 5.8 mg/dL (ref 4.0–7.8)

## 2013-01-07 MED ORDER — INDOMETHACIN 25 MG PO CAPS: 25.0000 mg | ORAL_CAPSULE | Freq: Three times a day (TID) | ORAL | Status: DC | PRN

## 2013-01-07 NOTE — Patient Instructions (Signed)
The patient is instructed to continue all medications as prescribed. Schedule followup with check out clerk upon leaving the clinic  

## 2013-01-07 NOTE — Progress Notes (Signed)
Subjective:    Patient ID: Jerry [name], male    DOB: 1936/11/22, 77 y.o.   MRN: [id number]  HPI FOllow up for HTN stable without chest pain Patient has a cardiac murmur of AS very mild per echo and has no symptoms associated with ythis Has been to a podiatrist for foot pain in right foot The patient has a hx of gout and pain in foot     Review of Systems  Constitutional: Negative for fever and fatigue.  HENT: Negative for hearing loss, congestion, neck pain and postnasal drip.   Eyes: Negative for discharge, redness and visual disturbance.  Respiratory: Negative for cough, shortness of breath and wheezing.   Cardiovascular: Negative for leg swelling.  Gastrointestinal: Negative for abdominal pain, constipation and abdominal distention.  Genitourinary: Negative for urgency and frequency.  Musculoskeletal: Negative for joint swelling and arthralgias.  Skin: Negative for color change and rash.  Neurological: Negative for weakness and light-headedness.  Hematological: Negative for adenopathy.  Psychiatric/Behavioral: Negative for behavioral problems.   Past Medical History  Diagnosis Date  . Cancer of prostate   . Diverticulosis   . Hypertension     History   Social History  . Marital Status: Divorced    Spouse Name: N/A    Number of Children: N/A  . Years of Education: N/A   Occupational History  . Not on file.   Social History Main Topics  . Smoking status: Former Smoker    Quit date: 12/26/1988  . Smokeless tobacco: Never Used  . Alcohol Use: No  . Drug Use: No  . Sexually Active: Not Currently   Other Topics Concern  . Not on file   Social History Narrative  . No narrative on file    Past Surgical History  Procedure Date  . Prostatectomy     seed implant  . Penile pump implant     Family History  Problem Relation Age of Onset  . Cirrhosis    . Early death Father     cirrhosis    No Known Allergies  Current Outpatient Prescriptions on  File Prior to Visit  Medication Sig Dispense Refill  . amLODipine-olmesartan (AZOR) 5-40 MG per tablet Take 1 tablet by mouth daily.        Marland Kitchen atorvastatin (LIPITOR) 40 MG tablet Take 1 tablet (40 mg total) by mouth daily.  90 tablet  3  . solifenacin (VESICARE) 10 MG tablet Take 5 mg by mouth daily.          BP 160/80  Pulse 72  Temp 98.2 F (36.8 C)  Resp 16  Ht 5' 8.5" (1.74 m)  Wt 206 lb (93.441 kg)  BMI 30.87 kg/m2  Repeated blood pressure and was at goal     Objective:   Physical Exam  Nursing note and vitals reviewed. Constitutional: He is oriented to person, place, and time. He appears well-developed and well-nourished.       Moderate obesity  HENT:  Head: Atraumatic.  Eyes: Conjunctivae normal are normal. Pupils are equal, round, and reactive to light.  Neck: Normal range of motion. Neck supple.  Cardiovascular: Normal rate and regular rhythm.   Murmur heard.      1/6 AS murmur  unchanged  Pulmonary/Chest: Breath sounds normal.  Abdominal: Soft. Bowel sounds are normal.  Musculoskeletal: Normal range of motion. He exhibits tenderness.  Neurological: He is alert and oriented to person, place, and time.  Skin: Skin is warm and dry.  Psychiatric: He has  a normal mood and affect. His behavior is normal.          Assessment & Plan:  . Repeat evaluation of Blood pressure and blood pressure was 135/70. As you may have mild to moderate anxiety when his blood pressure was taken this morning but after sitting for 5 minutes his blood pressure is normal.  He has a history of a mild aortic insufficiency murmur that is stable No evidence of symptomatic AS The patient Indocin 25 mg to take when necessary gouty arthritis Complete DOT physical for 2 years

## 2013-05-13 ENCOUNTER — Encounter: Payer: Self-pay | Admitting: Internal Medicine

## 2013-05-13 ENCOUNTER — Ambulatory Visit (INDEPENDENT_AMBULATORY_CARE_PROVIDER_SITE_OTHER): Payer: Medicare Other | Admitting: Internal Medicine

## 2013-05-13 VITALS — BP 132/80 | HR 72 | Temp 98.3°F | Resp 16 | Ht 68.5 in | Wt 208.0 lb

## 2013-05-13 DIAGNOSIS — A499 Bacterial infection, unspecified: Secondary | ICD-10-CM

## 2013-05-13 DIAGNOSIS — I10 Essential (primary) hypertension: Secondary | ICD-10-CM

## 2013-05-13 DIAGNOSIS — R011 Cardiac murmur, unspecified: Secondary | ICD-10-CM

## 2013-05-13 DIAGNOSIS — J329 Chronic sinusitis, unspecified: Secondary | ICD-10-CM

## 2013-05-13 DIAGNOSIS — B9689 Other specified bacterial agents as the cause of diseases classified elsewhere: Secondary | ICD-10-CM

## 2013-05-13 MED ORDER — DOXYCYCLINE HYCLATE 100 MG PO TABS: 100.0000 mg | ORAL_TABLET | Freq: Two times a day (BID) | ORAL | Status: DC

## 2013-05-13 NOTE — Progress Notes (Signed)
  Subjective:    Patient ID: Jerry [name], male    DOB: 19-Mar-1936, 77 y.o.   MRN: [id number]  HPI Noticed increased chest congestion not associated with swelling in his lower extremities and has been intermittently productive of some greenish sputum. Blood pressures been stable. No reported chest pain. No reported dyspnea. Weight is stable   Review of Systems  HENT: Positive for congestion, rhinorrhea and postnasal drip.   Eyes: Positive for itching.  Respiratory: Positive for cough. Negative for shortness of breath, wheezing and stridor.   Cardiovascular: Negative for chest pain, palpitations and leg swelling.   Past Medical History  Diagnosis Date  . Cancer of prostate   . Diverticulosis   . Hypertension     History   Social History  . Marital Status: Divorced    Spouse Name: N/A    Number of Children: N/A  . Years of Education: N/A   Occupational History  . Not on file.   Social History Main Topics  . Smoking status: Former Smoker    Quit date: 12/26/1988  . Smokeless tobacco: Never Used  . Alcohol Use: No  . Drug Use: No  . Sexually Active: Not Currently   Other Topics Concern  . Not on file   Social History Narrative  . No narrative on file    Past Surgical History  Procedure Laterality Date  . Prostatectomy      seed implant  . Penile pump implant      Family History  Problem Relation Age of Onset  . Cirrhosis    . Early death Father     cirrhosis    No Known Allergies  Current Outpatient Prescriptions on File Prior to Visit  Medication Sig Dispense Refill  . amLODipine-olmesartan (AZOR) 5-40 MG per tablet Take 1 tablet by mouth daily.        Marland Kitchen atorvastatin (LIPITOR) 40 MG tablet Take 1 tablet (40 mg total) by mouth daily.  90 tablet  3  . indomethacin (INDOCIN) 25 MG capsule Take 1 capsule (25 mg total) by mouth 3 (three) times daily as needed (gout).  30 capsule  11  . solifenacin (VESICARE) 10 MG tablet Take 5 mg by mouth daily.          No current facility-administered medications on file prior to visit.    BP 132/80  Pulse 72  Temp(Src) 98.3 F (36.8 C)  Resp 16  Ht 5' 8.5" (1.74 m)  Wt 208 lb (94.348 kg)  BMI 31.16 kg/m2       Objective:   Physical Exam  Nursing note and vitals reviewed. Constitutional: He appears well-developed and well-nourished.  HENT:  Head: Normocephalic and atraumatic.  Swollen turbinates  Eyes: Conjunctivae are normal. Pupils are equal, round, and reactive to light.  Neck: Normal range of motion. Neck supple.  Cardiovascular: Normal rate and regular rhythm.   Murmur heard. Pulmonary/Chest: Effort normal and breath sounds normal.  Abdominal: Soft. Bowel sounds are normal.          Assessment & Plan:  Acute bacterial sinusitis on allergic rhinitis Doxy for 10 days AND qNASL SPRY bid FOR 21 DAYS

## 2013-06-26 ENCOUNTER — Telehealth: Payer: Self-pay | Admitting: Internal Medicine

## 2013-06-26 NOTE — Telephone Encounter (Signed)
Patient had called -- information given -- bleeding stopped, still knot on hand. Attempted to reach patient back - got recording for [employer], left message to call office back.

## 2013-07-08 ENCOUNTER — Encounter: Payer: Self-pay | Admitting: Internal Medicine

## 2013-07-08 ENCOUNTER — Ambulatory Visit (INDEPENDENT_AMBULATORY_CARE_PROVIDER_SITE_OTHER): Payer: Medicare Other | Admitting: Internal Medicine

## 2013-07-08 VITALS — BP 140/80 | HR 72 | Temp 98.2°F | Resp 16 | Ht 68.5 in | Wt 210.0 lb

## 2013-07-08 DIAGNOSIS — M109 Gout, unspecified: Secondary | ICD-10-CM

## 2013-07-08 DIAGNOSIS — I10 Essential (primary) hypertension: Secondary | ICD-10-CM

## 2013-07-08 DIAGNOSIS — Z8546 Personal history of malignant neoplasm of prostate: Secondary | ICD-10-CM

## 2013-07-08 NOTE — Patient Instructions (Addendum)
The patient is instructed to continue all medications as prescribed. Schedule followup with check out clerk upon leaving the clinic  

## 2013-07-08 NOTE — Progress Notes (Signed)
Subjective:    Patient ID: Jerry [name], male    DOB: 10/16/1936, 77 y.o.   MRN: [id number]  HPI The patient has a history of prostate cancer and is followed by Dr Logan Bores HTN stable. 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  HENT: Negative for hearing loss, congestion, neck pain and postnasal drip.   Eyes: Negative for discharge, redness and visual disturbance.  Respiratory: Negative for cough, shortness of breath and wheezing.   Cardiovascular: Negative for leg swelling.  Gastrointestinal: Negative for abdominal pain, constipation and abdominal distention.  Genitourinary: Positive for frequency. Negative for urgency.  Musculoskeletal: Positive for back pain and joint swelling. Negative for arthralgias.  Skin: Negative for color change and rash.  Neurological: Negative for weakness and light-headedness.  Hematological: Negative for adenopathy.  Psychiatric/Behavioral: Negative for behavioral problems.   Past Medical History  Diagnosis Date  . Cancer of prostate   . Diverticulosis   . Hypertension     History   Social History  . Marital Status: Divorced    Spouse Name: N/A    Number of Children: N/A  . Years of Education: N/A   Occupational History  . Not on file.   Social History Main Topics  . Smoking status: Former Smoker    Quit date: 12/26/1988  . Smokeless tobacco: Never Used  . Alcohol Use: No  . Drug Use: No  . Sexually Active: Not Currently   Other Topics Concern  . Not on file   Social History Narrative  . No narrative on file    Past Surgical History  Procedure Laterality Date  . Prostatectomy      seed implant  . Penile pump implant      Family History  Problem Relation Age of Onset  . Cirrhosis    . Early death Father     cirrhosis    No Known Allergies  Current Outpatient Prescriptions on File Prior to Visit  Medication Sig  Dispense Refill  . amLODipine-olmesartan (AZOR) 5-40 MG per tablet Take 1 tablet by mouth daily.        . indomethacin (INDOCIN) 25 MG capsule Take 1 capsule (25 mg total) by mouth 3 (three) times daily as needed (gout).  30 capsule  11  . solifenacin (VESICARE) 10 MG tablet Take 5 mg by mouth daily.        Marland Kitchen atorvastatin (LIPITOR) 40 MG tablet Take 1 tablet (40 mg total) by mouth daily.  90 tablet  3   No current facility-administered medications on file prior to visit.    BP 140/80  Pulse 72  Temp(Src) 98.2 F (36.8 C)  Resp 16  Ht 5' 8.5" (1.74 m)  Wt 210 lb (95.255 kg)  BMI 31.46 kg/m2        Objective:   Physical Exam  Nursing note and vitals reviewed. Constitutional: He is oriented to person, place, and time. He appears well-developed and well-nourished.  HENT:  Head: Normocephalic and atraumatic.  Eyes: Conjunctivae are normal. Pupils are equal, round, and reactive to light.  Neck: Normal range of motion. Neck supple.  Cardiovascular: Normal rate and regular rhythm.   Pulmonary/Chest: Effort normal and breath sounds normal.  Abdominal: Soft. Bowel sounds are normal.  Musculoskeletal: He exhibits no edema.  Neurological: He is alert and oriented to person, place, and time.          Assessment &  Plan:  Stable blood pressure on Azor No edema No recent gout attacks BPH referred to Dr Logan Bores for hx of prostate cancer

## 2014-02-04 ENCOUNTER — Ambulatory Visit (INDEPENDENT_AMBULATORY_CARE_PROVIDER_SITE_OTHER): Payer: Medicare Other | Admitting: Family

## 2014-02-04 ENCOUNTER — Encounter: Payer: Self-pay | Admitting: Family

## 2014-02-04 VITALS — BP 128/78 | HR 68 | Temp 98.3°F | Ht 68.5 in | Wt 207.0 lb

## 2014-02-04 DIAGNOSIS — R350 Frequency of micturition: Secondary | ICD-10-CM

## 2014-02-04 DIAGNOSIS — J309 Allergic rhinitis, unspecified: Secondary | ICD-10-CM

## 2014-02-04 DIAGNOSIS — L309 Dermatitis, unspecified: Secondary | ICD-10-CM

## 2014-02-04 DIAGNOSIS — L259 Unspecified contact dermatitis, unspecified cause: Secondary | ICD-10-CM

## 2014-02-04 LAB — POCT URINALYSIS DIPSTICK
BILIRUBIN UA: NEGATIVE
Blood, UA: NEGATIVE
Glucose, UA: NEGATIVE
KETONES UA: NEGATIVE
LEUKOCYTES UA: NEGATIVE
Nitrite, UA: NEGATIVE
PH UA: 7.5
Spec Grav, UA: 1.025
Urobilinogen, UA: 0.2

## 2014-02-04 MED ORDER — FLUTICASONE PROPIONATE 50 MCG/ACT NA SUSP: 2.0000 | Freq: Every day | NASAL | Status: DC

## 2014-02-04 NOTE — Patient Instructions (Signed)
1. Flonase 2 sprays in each nostril once a day. 2. Zyrtec once a day 3. Hydrocortisone cream applied to the lower leg twice a day.   Allergic Rhinitis Allergic rhinitis is when the mucous membranes in the nose respond to allergens. Allergens are particles in the air that cause your body to have an allergic reaction. This causes you to release allergic antibodies. Through a chain of events, these eventually cause you to release histamine into the blood stream. Although meant to protect the body, it is this release of histamine that causes your discomfort, such as frequent sneezing, congestion, and an itchy, runny nose.  CAUSES  Seasonal allergic rhinitis (hay fever) is caused by pollen allergens that may come from grasses, trees, and weeds. Year-round allergic rhinitis (perennial allergic rhinitis) is caused by allergens such as house dust mites, pet dander, and mold spores.  SYMPTOMS   Nasal stuffiness (congestion).  Itchy, runny nose with sneezing and tearing of the eyes. DIAGNOSIS  Your health care provider can help you determine the allergen or allergens that trigger your symptoms. If you and your health care provider are unable to determine the allergen, skin or blood testing may be used. TREATMENT  Allergic Rhinitis does not have a cure, but it can be controlled by:  Medicines and allergy shots (immunotherapy).  Avoiding the allergen. Hay fever may often be treated with antihistamines in pill or nasal spray forms. Antihistamines block the effects of histamine. There are over-the-counter medicines that may help with nasal congestion and swelling around the eyes. Check with your health care provider before taking or giving this medicine.  If avoiding the allergen or the medicine prescribed do not work, there are many new medicines your health care provider can prescribe. Stronger medicine may be used if initial measures are ineffective. Desensitizing injections can be used if medicine and  avoidance does not work. Desensitization is when a patient is given ongoing shots until the body becomes less sensitive to the allergen. Make sure you follow up with your health care provider if problems continue. HOME CARE INSTRUCTIONS It is not possible to completely avoid allergens, but you can reduce your symptoms by taking steps to limit your exposure to them. It helps to know exactly what you are allergic to so that you can avoid your specific triggers. SEEK MEDICAL CARE IF:   You have a fever.  You develop a cough that does not stop easily (persistent).  You have shortness of breath.  You start wheezing.  Symptoms interfere with normal daily activities. Document Released: 09/06/2001 Document Revised: 10/02/2013 Document Reviewed: 08/19/2013 [facility] Patient Information 2014 [place].

## 2014-02-04 NOTE — Progress Notes (Signed)
Pre visit review using our clinic review tool, if applicable. No additional management support is needed unless otherwise documented below in the visit note. 

## 2014-02-04 NOTE — Progress Notes (Signed)
Subjective:    Patient ID: Jerry [name], male    DOB: 02/28/1936, 78 y.o.   MRN: [id number]  HPI [name], nonsmoker presenting to day for a cough, rash on legs, and urinary frequency.  Problem present x 2 week.  Cough worst at night with green phlegm present.  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  He has a dry itchy on both legs, has been applying alcohol that has not helped. Patient currently takes Vesicare and is complaining of urinary frequency. Denies any burning with urination    Review of Systems  Constitutional: Negative.   HENT: Negative for sinus pressure and sneezing.   Respiratory: Positive for cough.   Cardiovascular: Negative.   Gastrointestinal: Negative.   Genitourinary:       Urinary frequency  Musculoskeletal: Negative.   Skin: Positive for rash.  Hematological: Negative.    Past Medical History  Diagnosis Date  . Cancer of prostate   . Diverticulosis   . Hypertension     History   Social History  . Marital Status: Divorced    Spouse Name: N/A    Number of Children: N/A  . Years of Education: N/A   Occupational History  . Not on file.   Social History Main Topics  . Smoking status: Former Smoker    Quit date: 12/26/1988  . Smokeless tobacco: Never Used  . Alcohol Use: No  . Drug Use: No  . Sexual Activity: Not Currently   Other Topics Concern  . Not on file   Social History Narrative  . No narrative on file    Past Surgical History  Procedure Laterality Date  . Prostatectomy      seed implant  . Penile pump implant      Family History  Problem Relation Age of Onset  . Cirrhosis    . Early death Father     cirrhosis    No Known Allergies  Current Outpatient Prescriptions on File Prior to Visit  Medication Sig Dispense Refill  . amLODipine-olmesartan (AZOR) 5-40 MG per tablet Take 1 tablet by mouth daily.        . indomethacin  (INDOCIN) 25 MG capsule Take 1 capsule (25 mg total) by mouth 3 (three) times daily as needed (gout).  30 capsule  11  . solifenacin (VESICARE) 10 MG tablet Take 5 mg by mouth daily.        Marland Kitchen atorvastatin (LIPITOR) 40 MG tablet Take 1 tablet (40 mg total) by mouth daily.  90 tablet  3   No current facility-administered medications on file prior to visit.    BP 128/78  Pulse 68  Temp(Src) 98.3 F (36.8 C) (Oral)  Ht 5' 8.5" (1.74 m)  Wt 207 lb (93.895 kg)  BMI 31.01 kg/m2chart     Objective:   Physical Exam  Constitutional: He is oriented to person, place, and time. He appears well-developed and well-nourished.  HENT:  Head: Normocephalic.  Right Ear: External ear normal.  Left Ear: External ear normal.  nasal congestion  Neck: Normal range of motion. Neck supple.  Cardiovascular: Normal rate, regular rhythm and normal heart sounds.   Pulmonary/Chest: Effort normal and breath sounds normal.  Musculoskeletal: Normal range of motion.  Neurological: He is alert and oriented to person, place, and time.  Skin: Skin is warm and dry. Rash noted.  Dry patchy rash to the right lower  leg. Approx 1cm  Psychiatric: He has a normal mood and affect.          Assessment & Plan:  .Jerry [name] was seen today for cough and rash.  Diagnoses and associated orders for this visit:  Allergic rhinitis  Urinary frequency - POCT urinalysis dipstick  Dermatitis  Other Orders - fluticasone (FLONASE) 50 MCG/ACT nasal spray; Place 2 sprays into both nostrils daily.   Hydrocortisone cream applied to the AA twice a day.

## 2014-10-15 ENCOUNTER — Ambulatory Visit (INDEPENDENT_AMBULATORY_CARE_PROVIDER_SITE_OTHER): Payer: Medicare Other | Admitting: Family Medicine

## 2014-10-15 ENCOUNTER — Encounter: Payer: Self-pay | Admitting: Family Medicine

## 2014-10-15 VITALS — BP 150/70 | Temp 97.8°F | Wt 204.0 lb

## 2014-10-15 DIAGNOSIS — J309 Allergic rhinitis, unspecified: Secondary | ICD-10-CM | POA: Insufficient documentation

## 2014-10-15 DIAGNOSIS — I10 Essential (primary) hypertension: Secondary | ICD-10-CM

## 2014-10-15 DIAGNOSIS — Z23 Encounter for immunization: Secondary | ICD-10-CM

## 2014-10-15 DIAGNOSIS — N3281 Overactive bladder: Secondary | ICD-10-CM | POA: Insufficient documentation

## 2014-10-15 DIAGNOSIS — E785 Hyperlipidemia, unspecified: Secondary | ICD-10-CM | POA: Insufficient documentation

## 2014-10-15 MED ORDER — ATORVASTATIN CALCIUM 40 MG PO TABS: 40.0000 mg | ORAL_TABLET | Freq: Every day | ORAL | Status: DC

## 2014-10-15 NOTE — Progress Notes (Signed)
Jerry Reddish, MD Phone: [phone]  Subjective:  Patient presents today to establish care with me as their new primary care provider. Patient was formerly a patient of Jerry [name]. Chief complaint-noted.   Hypertension-poor control as did not take BP meds today  Hyperlpidemia-poor control with last LDL >100, not sure if he is taking medication BP Readings from Last 3 Encounters:  10/15/14 150/70  02/04/14 128/78  07/08/13 140/80  states only intermittently takes BP medications Compliant with medications-no, only intermittently ROS-Denies any CP, HA, SOB, blurry vision, LE edema  Overactive bladder-stable Takes vesicare daily (only medicine he is sure he takes everyday) ROS- no dry mouth or lightheadedness  The following were reviewed and entered/updated in epic: Past Medical History  Diagnosis Date  . Cancer of prostate   . Diverticulosis   . Hypertension   . DIVERTICULOSIS, COLON 07/25/2007    Qualifier: Diagnosis of  By: [name] LPN, [name]    Patient Active Problem List   Diagnosis Date Noted  . Hyperlipidemia 10/15/2014    Priority: Medium  . Overactive bladder 10/15/2014    Priority: Medium  . Gout of big toe 08/04/2008    Priority: Medium  . Essential hypertension 07/25/2007    Priority: Medium  . PROSTATE CANCER, HX OF 07/25/2007    Priority: Medium  . Allergic rhinitis 10/15/2014    Priority: Low  . CARDIAC MURMUR 01/10/2011    Priority: Low  . ORGANIC IMPOTENCE 08/12/2010    Priority: Low   Past Surgical History  Procedure Laterality Date  . Prostatectomy      seed implant  . Penile pump implant      patient denies    Family History  Problem Relation Age of Onset  . Cirrhosis    . Early death Father     cirrhosis  . Breast cancer Sister   . Alcoholism Father     Medications- reviewed and updated Current Outpatient Prescriptions  Medication Sig Dispense Refill  . amLODipine-olmesartan (AZOR) 5-40 MG per tablet Take 1 tablet by mouth  daily.        . indomethacin (INDOCIN) 25 MG capsule Take 1 capsule (25 mg total) by mouth 3 (three) times daily as needed (gout).  30 capsule  11  . solifenacin (VESICARE) 10 MG tablet Take 5 mg by mouth daily.        Marland Kitchen atorvastatin (LIPITOR) 40 MG tablet Take 1 tablet (40 mg total) by mouth daily.  90 tablet  3  . fluticasone (FLONASE) 50 MCG/ACT nasal spray Place 2 sprays into both nostrils daily.  16 g  6   No current facility-administered medications for this visit.    Allergies-reviewed and updated No Known Allergies  History   Social History  . Marital Status: Divorced    Spouse Name: N/A    Number of Children: N/A  . Years of Education: N/A   Social History Main Topics  . Smoking status: Former Smoker    Quit date: 12/26/1988  . Smokeless tobacco: Never Used  . Alcohol Use: No  . Drug Use: No  . Sexual Activity: Not Currently   Other Topics Concern  . None   Social History Narrative   Separated since 2010. 4 kids. 4 grandkids. 2 greatgrandkids.       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     ROS--See HPI   Objective: BP 150/70  Temp(Src) 97.8 F (36.6 C)  Wt 204 lb (92.534 kg) Gen: NAD, resting  comfortably CV: RRR 3/6 SEM RUSB heard best, nomurmurs rubs or gallops Lungs: CTAB no crackles, wheeze, rhonchi Abdomen: soft/nontender/nondistended/normal bowel sounds.  Ext: no edema, 2+ PT pulses Skin: warm, dry, no rash Neuro: grossly normal, moves all extremities, PERRLA  Assessment/Plan:  Overactive bladder Continue vesicare as well controlled  Hyperlipidemia Patient not compliant with lipitor so is poorly controlled.Advised he should take this daily. Refilled and sent to pharmacy.   Essential hypertension Poor control off azor. Instructed patient to restart. F/u 3 months fasting and will check labs.   Meds ordered this encounter  Medications  . atorvastatin (LIPITOR) 40 MG tablet    Sig: Take 1 tablet (40 mg total) by mouth  daily.    Dispense:  90 tablet    Refill:  3

## 2014-10-15 NOTE — Patient Instructions (Addendum)
Bring all medications to next visit.   Wonderful to meet you.   You need to take your blood pressure medicine daily.   Flu shot today

## 2014-10-15 NOTE — Assessment & Plan Note (Signed)
Continue vesicare as well controlled

## 2014-10-15 NOTE — Assessment & Plan Note (Signed)
Patient not compliant with lipitor so is poorly controlled.Advised he should take this daily. Refilled and sent to pharmacy.

## 2014-10-15 NOTE — Assessment & Plan Note (Signed)
Poor control off azor. Instructed patient to restart. F/u 3 months fasting and will check labs.

## 2015-01-01 ENCOUNTER — Telehealth: Payer: Self-pay | Admitting: Family Medicine

## 2015-01-01 ENCOUNTER — Ambulatory Visit: Payer: Medicare Other | Admitting: Family Medicine

## 2015-01-01 NOTE — Telephone Encounter (Signed)
Pt would like to see Dr Yong Channel before he his a physical on 9/89/21  for his [employer] license. I put him with Dr Elease Hashimoto but I don't think that will work. Will you see check to see if Dr Yong Channel will squeeze him tomorrow at 4 .

## 2015-01-01 NOTE — Telephone Encounter (Signed)
Please call in early AM.   This is a 3 month follow up. Patient missed his appointment on 12/31/13. I would ask him to reschedule this appointment with me next week instead of seeing Dr. Elease Hashimoto tomorrow.   If there is some reason that this truly cannot be done next week, then we can put him in at 4:15 PM tomorrow.

## 2015-01-01 NOTE — Telephone Encounter (Signed)
Dr. Yong Channel see below

## 2015-01-02 ENCOUNTER — Ambulatory Visit: Payer: Self-pay | Admitting: Family Medicine

## 2015-01-02 NOTE — Telephone Encounter (Signed)
Called and LM on pt VM TCB. Mrs. Jerry [name] when pt CB can you see if he will see Dr. Yong Channel next week instead of seeing Dr. Elease Hashimoto?

## 2015-01-02 NOTE — Telephone Encounter (Signed)
Pt hs been scheduled for 01/07/15 at 3pm

## 2015-01-07 ENCOUNTER — Ambulatory Visit: Payer: Self-pay | Admitting: Family Medicine

## 2015-01-19 ENCOUNTER — Ambulatory Visit: Payer: Medicare Other | Admitting: Family Medicine

## 2015-02-19 DIAGNOSIS — Z8546 Personal history of malignant neoplasm of prostate: Secondary | ICD-10-CM | POA: Diagnosis not present

## 2015-02-19 DIAGNOSIS — N528 Other male erectile dysfunction: Secondary | ICD-10-CM | POA: Diagnosis not present

## 2015-02-19 DIAGNOSIS — N508 Other specified disorders of male genital organs: Secondary | ICD-10-CM | POA: Diagnosis not present

## 2015-02-19 DIAGNOSIS — R8290 Unspecified abnormal findings in urine: Secondary | ICD-10-CM | POA: Diagnosis not present

## 2015-04-13 ENCOUNTER — Telehealth: Payer: Self-pay | Admitting: Family Medicine

## 2015-04-13 NOTE — Telephone Encounter (Signed)
See below

## 2015-04-13 NOTE — Telephone Encounter (Signed)
Daughter has medical and general POA. She is sending those to [place] this week. 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 She will be sending you a letter for you to read over. Does not want her father to feel like she is 'conspiring against' him. She is just very concerned and is scheduling an appt to see you with her father present. She is actually planning the trip from [place] around the visit  But does not want her father to know that.

## 2015-04-13 NOTE — Telephone Encounter (Signed)
If she has concerns about him driving, should submit her concerns to [facility] before he sees me. I am happy to see patient at time of visit and read over letter since she has medical POA

## 2015-05-08 ENCOUNTER — Ambulatory Visit (INDEPENDENT_AMBULATORY_CARE_PROVIDER_SITE_OTHER): Payer: Medicare Other | Admitting: Family Medicine

## 2015-05-08 ENCOUNTER — Encounter: Payer: Self-pay | Admitting: Family Medicine

## 2015-05-08 VITALS — BP 138/62 | HR 82 | Temp 99.0°F | Wt 203.0 lb

## 2015-05-08 DIAGNOSIS — R413 Other amnesia: Secondary | ICD-10-CM

## 2015-05-08 DIAGNOSIS — E785 Hyperlipidemia, unspecified: Secondary | ICD-10-CM | POA: Diagnosis not present

## 2015-05-08 DIAGNOSIS — Z23 Encounter for immunization: Secondary | ICD-10-CM

## 2015-05-08 NOTE — Progress Notes (Signed)
Jerry Reddish, MD  Subjective:  Jerry [name] is a 79 y.o. year old very pleasant male patient who presents with:  Memory loss-new complaint -forgetting names such as former doctors including Dr. Arnoldo [name] who he saw for years. Forgets names of meds. depends on GPS for driving. 8th grade education. Difficult to say how long these symptoms have been going on.   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 She is the [facility] and specifically requested neurology evaluation. She asked that I not discuss her initial concern with patient and this was honored during visit.   ROS- no extremity weakness, headaches, blurry vision, slurred words, troubel swallowing  Past Medical History- HTN< gout, prostate cancer, HLD, overactive bladder  Medications- reviewed and updated Current Outpatient Prescriptions  Medication Sig Dispense Refill  . amLODipine-olmesartan (AZOR) 5-40 MG per tablet Take 1 tablet by mouth daily.      Marland Kitchen atorvastatin (LIPITOR) 40 MG tablet Take 1 tablet (40 mg total) by mouth daily. 90 tablet 3  . indomethacin (INDOCIN) 25 MG capsule Take 1 capsule (25 mg total) by mouth 3 (three) times daily as needed (gout). 30 capsule 11  . solifenacin (VESICARE) 10 MG tablet Take 5 mg by mouth daily.      . fluticasone (FLONASE) 50 MCG/ACT nasal spray Place 2 sprays into both nostrils daily. (Patient not taking: Reported on 05/08/2015) 16 g 6   No current facility-administered medications for this visit.    Objective: BP 138/62 mmHg  Pulse 82  Temp(Src) 99 F (37.2 C)  Wt 203 lb (92.08 kg) Gen: NAD, resting comfortably CV: 3/6 SEM RRR no rubs or gallops Lungs: CTAB no crackles, wheeze, rhonchi Abdomen: soft/nontender/nondistended/normal bowel sounds. No rebound or guarding.  Ext: no edema Skin: warm, dry, no rash Neuro: CN II-XII intact, sensation and reflexes normal throughout,  5/5 muscle strength in bilateral upper and lower extremities. Normal finger to nose. Normal rapid alternating movements.   MMSE 22/30    Assessment/Plan:  Memory loss MMSE 22/30 with 8th grade education. I am concerned about dementia obviously. Check labs for reversible causes of dementia. Hold on MRI until neuro evaluation. Refer to neurology per [facility] request ([name] asks for father not to be informed of this request or her involvement in having him evaluated). I have asked patient only to drive in town and not go on long drives as part of work and he agrees.   No clear indication for primary prevention-could also consider coming off statin but do not see any other medication contributions potentially to memory loss.     Fasting labs Orders Placed This Encounter  Procedures  . Pneumococcal conjugate vaccine 13-valent  . CBC    [place]    Standing Status: Future     Number of Occurrences:      Standing Expiration Date: 05/07/2016  . Comprehensive metabolic panel    [place]    Standing Status: Future     Number of Occurrences:      Standing Expiration Date: 05/07/2016    Order Specific Question:  Has the patient fasted?    Answer:  No  . HIV antibody    [facility]    Standing Status: Future     Number of Occurrences:      Standing Expiration Date: 05/07/2016  . RPR    [facility]    Standing Status: Future     Number  of Occurrences:      Standing Expiration Date: 05/07/2016  . TSH    [place]    Standing Status: Future     Number of Occurrences:      Standing Expiration Date: 05/07/2016  . Vitamin B12    Standing Status: Future     Number of Occurrences:      Standing Expiration Date: 05/07/2016  . Lipid panel    [place]    Standing Status: Future     Number of Occurrences:      Standing Expiration Date: 05/07/2016    Order Specific Question:  Has the patient fasted?    Answer:  No  . Ambulatory referral to Neurology    Referral Priority:  Routine    Referral  Type:  Consultation    Referral Reason:  Specialty Services Required    Requested Specialty:  Neurology    Number of Visits Requested:  1    No orders of the defined types were placed in this encounter.

## 2015-05-08 NOTE — Patient Instructions (Addendum)
Received final pneumonia shot today ([id number])  I am concerned about your memory loss. I am concerned about potential dementia. We have referred you to neurology at this time for a more thorough evaluation. You may end up getting an MRI associated with this. Until you see them, I want you to hold off on any of the long drives and just drive locally.    Before that visit, I want you to get labs. Schedule a lab visit at the front desk. Return for future fasting labs. Nothing but water after midnight please.

## 2015-05-08 NOTE — Assessment & Plan Note (Addendum)
MMSE 22/30 with 8th grade education. I am concerned about dementia obviously. Check labs for reversible causes of dementia. Hold on MRI until neuro evaluation. Refer to neurology per [facility] request (daughter [name] asks for father not to be informed of this request or her involvement in having him evaluated). I have asked patient only to drive in town and not go on long drives as part of work and he agrees.   No clear indication for primary prevention-could also consider coming off statin but do not see any other medication contributions potentially to memory loss.

## 2015-06-22 ENCOUNTER — Telehealth: Payer: Self-pay | Admitting: Family Medicine

## 2015-06-22 ENCOUNTER — Telehealth: Payer: Self-pay | Admitting: *Deleted

## 2015-06-22 NOTE — Telephone Encounter (Signed)
Pt called Dr Lars Masson office and cancelled his appt for Wed to discuss ? Memory loss. Pt stated he did not have an issue with memory loss and he did not need to be evaluated.

## 2015-06-22 NOTE — Telephone Encounter (Signed)
FYI

## 2015-06-22 NOTE — Telephone Encounter (Signed)
Noted and Dr. Yong Channel made aware.

## 2015-06-22 NOTE — Telephone Encounter (Signed)
FYI . Pt daughter call to say that she found out that Jerry [name] cancel his app this week with the neuro doctor 06/23/15. She said she was able to get the appt rescheduled for 07/02/15 . She said he is convinced that there is nothing wrong with him

## 2015-06-22 NOTE — Telephone Encounter (Signed)
Patient cancelled new patient appointment. Referring office notified

## 2015-06-24 ENCOUNTER — Ambulatory Visit: Payer: Medicare Other | Admitting: Neurology

## 2015-07-02 ENCOUNTER — Other Ambulatory Visit (INDEPENDENT_AMBULATORY_CARE_PROVIDER_SITE_OTHER): Payer: Medicare Other

## 2015-07-02 ENCOUNTER — Encounter: Payer: Self-pay | Admitting: Neurology

## 2015-07-02 ENCOUNTER — Ambulatory Visit (INDEPENDENT_AMBULATORY_CARE_PROVIDER_SITE_OTHER): Payer: Medicare Other | Admitting: Neurology

## 2015-07-02 VITALS — BP 122/70 | HR 80 | Ht 66.75 in | Wt 194.7 lb

## 2015-07-02 DIAGNOSIS — R413 Other amnesia: Secondary | ICD-10-CM

## 2015-07-02 LAB — COMPREHENSIVE METABOLIC PANEL
ALT: <8 U/L (ref 0–53)
AST: 13 U/L (ref 0–37)
Albumin: 3.8 g/dL (ref 3.5–5.2)
Alkaline Phosphatase: 77 U/L (ref 39–117)
BUN: 8 mg/dL (ref 6–23)
CO2: 25 meq/L (ref 19–32)
CREATININE: 1.21 mg/dL (ref 0.50–1.35)
Calcium: 8.9 mg/dL (ref 8.4–10.5)
Chloride: 104 mEq/L (ref 96–112)
GLUCOSE: 83 mg/dL (ref 70–99)
Potassium: 4 mEq/L (ref 3.5–5.3)
SODIUM: 138 meq/L (ref 135–145)
TOTAL PROTEIN: 7.5 g/dL (ref 6.0–8.3)
Total Bilirubin: 0.5 mg/dL (ref 0.2–1.2)

## 2015-07-02 LAB — CBC WITH DIFFERENTIAL/PLATELET
BASOS ABS: 0 10*3/uL (ref 0.0–0.1)
BASOS PCT: 0.3 % (ref 0.0–3.0)
Eosinophils Absolute: 0.1 10*3/uL (ref 0.0–0.7)
Eosinophils Relative: 0.9 % (ref 0.0–5.0)
HEMATOCRIT: 38.5 % — AB (ref 39.0–52.0)
HEMOGLOBIN: 12.5 g/dL — AB (ref 13.0–17.0)
LYMPHS ABS: 2 10*3/uL (ref 0.7–4.0)
Lymphocytes Relative: 25.9 % (ref 12.0–46.0)
MCHC: 32.6 g/dL (ref 30.0–36.0)
MCV: 85.4 fl (ref 78.0–100.0)
MONOS PCT: 4.8 % (ref 3.0–12.0)
Monocytes Absolute: 0.4 10*3/uL (ref 0.1–1.0)
Neutro Abs: 5.3 10*3/uL (ref 1.4–7.7)
Neutrophils Relative %: 68.1 % (ref 43.0–77.0)
Platelets: 247 10*3/uL (ref 150.0–400.0)
RBC: 4.5 Mil/uL (ref 4.22–5.81)
RDW: 15.8 % — ABNORMAL HIGH (ref 11.5–15.5)
WBC: 7.7 10*3/uL (ref 4.0–10.5)

## 2015-07-02 LAB — VITAMIN B12: VITAMIN B 12: 313 pg/mL (ref 211–911)

## 2015-07-02 LAB — TSH: TSH: 0.77 u[IU]/mL (ref 0.35–4.50)

## 2015-07-02 MED ORDER — DONEPEZIL HCL 10 MG PO TABS: ORAL_TABLET | ORAL | Status: DC

## 2015-07-02 NOTE — Progress Notes (Signed)
NEUROLOGY CONSULTATION NOTE  Jerry [name] MRN: [id number] DOB: 1936/08/25  Referring provider: Dr. Garret Reddish Primary care provider: Dr. Garret Reddish  Reason for consult:  Memory loss  Dear Dr Yong Channel:  Thank you for your kind referral of Jerry [name] for consultation of the above symptoms. Although his history is well known to you, please allow me to reiterate it for the purpose of our medical record. The patient was accompanied to the clinic by his daughter from [place] who also provides some collateral information. Records and images were personally reviewed where available.  HISTORY OF PRESENT ILLNESS: This is a pleasant 79 year old right-handed man with a history of hypertension, hyperlipidemia, prostate cancer, presenting for evaluation of memory loss. 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 He reports that debt gets heavy due to maintenance issues, and that his daughter visiting him is helping him recently. His daughter unfortunately could not provide any information today for me, she gave me a small note stating that she could not say anything in front of him because she is the only one he trusts. She did write on the note that he gets lost driving short distances, and repeats himself or asks the same questions within 5 minutes. She expressed that she is afraid for his driving and that he gets angry. The patient himself denies getting lost driving but does agree that he gets turned around sometimes. He uses a GPS mostly. He denies any forgotten bills, but states he has some missed bills because of lack of funds rather than forgetting. He brings his medications and states he only takes the Azor and omega 3 on a daily basis, but occasionally forgets them when he is busy. He is also listed to be taking Lipitor and Vesicare, but he denies taking them. He denies  any word-finding difficulties, he denies misplacing things. He does note some difficulty multitasking. He denies any family history of dementia, no history of head injuries. He had stopped alcohol use many years ago.   He denies any headaches, dizziness, diplopia, dysarthria, dysphagia, neck/back pain, focal numbness/tingling/weakness, bowel/bladder dysfunction. No anosmia, tremors, no falls.   PAST MEDICAL HISTORY: Past Medical History  Diagnosis Date  . Cancer of prostate   . Diverticulosis   . Hypertension   . DIVERTICULOSIS, COLON 07/25/2007    Qualifier: Diagnosis of  By: [name] LPN, [name]     PAST SURGICAL HISTORY: Past Surgical History  Procedure Laterality Date  . Prostatectomy      seed implant  . Penile pump implant      patient denies    MEDICATIONS: Current Outpatient Prescriptions on File Prior to Visit  Medication Sig Dispense Refill  . amLODipine-olmesartan (AZOR) 5-40 MG per tablet Take 1 tablet by mouth daily.      Marland Kitchen atorvastatin (LIPITOR) 40 MG tablet Take 1 tablet (40 mg total) by mouth daily. (Patient not taking: Reported on 07/02/2015) 90 tablet 3  . fluticasone (FLONASE) 50 MCG/ACT nasal spray Place 2 sprays into both nostrils daily. (Patient not taking: Reported on 07/02/2015) 16 g 6  . indomethacin (INDOCIN) 25 MG capsule Take 1 capsule (25 mg total) by mouth 3 (three) times daily as needed (gout). (Patient not taking: Reported on 07/02/2015) 30 capsule 11  . solifenacin (VESICARE) 10 MG tablet Take 5 mg by mouth daily.       No current facility-administered medications  on file prior to visit.    ALLERGIES: No Known Allergies  FAMILY HISTORY: Family History  Problem Relation Age of Onset  . Cirrhosis    . Early death Father     cirrhosis  . Breast cancer Sister   . Alcoholism Father     SOCIAL HISTORY: History   Social History  . Marital Status: Divorced    Spouse Name: N/A  . Number of Children: N/A  . Years of Education: N/A    Occupational History  . Not on file.   Social History Main Topics  . Smoking status: Former Smoker    Quit date: 12/26/1988  . Smokeless tobacco: Never Used  . Alcohol Use: No  . Drug Use: No  . Sexual Activity: Not Currently   Other Topics Concern  . Not on file   Social History Narrative   Separated since 2010. 4 kids. 4 grandkids. 2 greatgrandkids.       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    REVIEW OF SYSTEMS: Constitutional: No fevers, chills, or sweats, no generalized fatigue, change in appetite Eyes: No visual changes, double vision, eye pain Ear, nose and throat: No hearing loss, ear pain, nasal congestion, sore throat Cardiovascular: No chest pain, palpitations Respiratory:  No shortness of breath at rest or with exertion, wheezes GastrointestinaI: No nausea, vomiting, diarrhea, abdominal pain, fecal incontinence Genitourinary:  No dysuria, urinary retention or frequency Musculoskeletal:  No neck pain, back pain Integumentary: No rash, pruritus, skin lesions Neurological: as above Psychiatric: No depression, insomnia, anxiety Endocrine: No palpitations, fatigue, diaphoresis, mood swings, change in appetite, change in weight, increased thirst Hematologic/Lymphatic:  No anemia, purpura, petechiae. Allergic/Immunologic: no itchy/runny eyes, nasal congestion, recent allergic reactions, rashes  PHYSICAL EXAM: Filed Vitals:   07/02/15 0953  BP: 122/70  Pulse: 80   General: No acute distress Head:  Normocephalic/atraumatic Eyes: Fundoscopic exam shows bilateral sharp discs, no vessel changes, exudates, or hemorrhages Neck: supple, no paraspinal tenderness, full range of motion Back: No paraspinal tenderness Heart: regular rate and rhythm Lungs: Clear to auscultation bilaterally. Vascular: No carotid bruits. Skin/Extremities: No rash, no edema Neurological Exam: Mental status: alert and oriented to person, place, month and  season. He could not recall all the names of his children and stated that he had 6 children (he has 4 children). Did not know year, no dysarthria or aphasia, Fund of knowledge is appropriate.  Remote memory intact.  Attention and concentration are normal.    Able to name objects and repeat phrases.  MMSE - Mini Mental State Exam 07/02/2015  Orientation to time 2  Orientation to Place 5  Registration 3  Attention/ Calculation 1  Recall 0  Language- name 2 objects 2  Language- repeat 1  Language- follow 3 step command 3  Language- read & follow direction 1  Write a sentence 1  Copy design 1  Total score 20   Cranial nerves: CN I: not tested CN II: pupils equal, round and reactive to light, visual fields intact, fundi unremarkable. CN III, IV, VI:  full range of motion, no nystagmus, no ptosis CN V: facial sensation intact CN VII: upper and lower face symmetric CN VIII: hearing intact to finger rub CN IX, X: gag intact, uvula midline CN XI: sternocleidomastoid and trapezius muscles intact CN XII: tongue midline Bulk & Tone: normal, no fasciculations. Motor: 5/5 throughout with no pronator drift. Sensation: decreased vibration to both ankles, otherwise intact to light touch,  cold, pin, and joint position sense.  No extinction to double simultaneous stimulation.  Romberg test negatvie Deep Tendon Reflexes: +2 throughout except for absent ankle jerks bilaterally, no ankle clonus Plantar responses: downgoing bilaterally Cerebellar: no incoordination on finger to nose, heel to shin. No dysdiadochokinesia Gait: narrow-based and steady, able to tandem walk adequately. Tremor: none  IMPRESSION: This is a pleasant 79 year old right-handed man with a history of hypertension, hyperlipidemia, prostate cancer, presenting for evaluation of memory loss. His MMSE today is 20/30, indicating mild dementia. We discussed different causes of memory loss. Check RPR, TSH and B12. He had not done bloodwork  ordered by his PCP, CBC and CMP will be ordered as well. MRI brain without contrast will be ordered to assess for underlying structural abnormality and assess vascular load. We discussed that he may benefit from starting cholinesterase inhibitors such as Aricept, side effects and expectations from the medication were discussed. He was given a prescription for Aricept '5mg'$  daily for 1 week, then increase to '10mg'$  daily. We discussed the importance of control of vascular risk factors, physical exercise, and brain stimulation exercises for brain health. We also discussed driving concerns, I discussed recommendation to have a driving evaluation at the [facility]. I also discussed slowing down at work and getting more help. He will follow-up in 6 months or earlier if needed.   Thank you for allowing me to participate in the care of this patient. Please do not hesitate to call for any questions or concerns.   Ellouise Newer, M.D.  CC: Dr. Yong Channel

## 2015-07-02 NOTE — Patient Instructions (Addendum)
1. Bloodwork for CBC, CMP, TSH, B12, RPR.  Please go to [address] for Labwork today. 2. Schedule MRI brain without contrast, [facility] 07/13/2015 at 5pm.  Please arrive at 445pm. 3. Start Aricept '10mg'$ : Take 1/2 tablet daily for 1 week, then increase to 1 tablet daily and continue 4. Recommend DMV Driving Assessment for memory loss 5. Follow-up in 6 months

## 2015-07-07 ENCOUNTER — Telehealth: Payer: Self-pay | Admitting: Family Medicine

## 2015-07-07 NOTE — Telephone Encounter (Signed)
Spoke with patient's POA/[name] & notified of results.

## 2015-07-07 NOTE — Telephone Encounter (Signed)
-----   Message from Cameron Sprang, MD sent at 07/06/2015  3:08 PM EDT ----- Pls let patient know bloodwork was unremarkable. Thanks

## 2015-07-10 ENCOUNTER — Telehealth: Payer: Self-pay | Admitting: Neurology

## 2015-07-10 MED ORDER — DIAZEPAM 5 MG PO TABS: ORAL_TABLET | ORAL | Status: DC

## 2015-07-10 NOTE — Telephone Encounter (Signed)
I spoke with patient's daughter. States patient needs something to help keep him calm for MRI on Monday. Will sent Rx for Valium to his pharmacy.

## 2015-07-10 NOTE — Telephone Encounter (Signed)
Pt Jerry [name]/ [birth year] is requesting a sedative for upcoming MRI Mon.07/13/15/call back PH# [phone]

## 2015-07-13 ENCOUNTER — Ambulatory Visit (HOSPITAL_COMMUNITY)
Admission: RE | Admit: 2015-07-13 | Discharge: 2015-07-13 | Disposition: A | Discharge status: Home / Self Care | Payer: Medicare Other | Source: Ambulatory Visit | Attending: Neurology | Admitting: Neurology

## 2015-07-13 DIAGNOSIS — R413 Other amnesia: Secondary | ICD-10-CM | POA: Diagnosis not present

## 2015-07-13 DIAGNOSIS — R410 Disorientation, unspecified: Secondary | ICD-10-CM | POA: Diagnosis not present

## 2015-07-13 DIAGNOSIS — Z8546 Personal history of malignant neoplasm of prostate: Secondary | ICD-10-CM | POA: Insufficient documentation

## 2015-07-13 DIAGNOSIS — I10 Essential (primary) hypertension: Secondary | ICD-10-CM | POA: Diagnosis not present

## 2015-07-13 DIAGNOSIS — I739 Peripheral vascular disease, unspecified: Secondary | ICD-10-CM | POA: Diagnosis not present

## 2015-07-15 ENCOUNTER — Telehealth: Payer: Self-pay | Admitting: Family Medicine

## 2015-07-15 MED ORDER — PAROXETINE HCL 10 MG PO TABS: 10.0000 mg | ORAL_TABLET | Freq: Every day | ORAL | Status: DC

## 2015-07-15 NOTE — Telephone Encounter (Signed)
I spoke with patient's daughter/[name] and notified of result. She would like to speak with you she has some questions about the next step since MRI was unremarkable [phone].

## 2015-07-15 NOTE — Telephone Encounter (Signed)
Called number listed, no answer, unable to leave message because voicemailbox is full. Will try calling again later

## 2015-07-15 NOTE — Telephone Encounter (Signed)
Spoke to daughter. Discussed MRI findings, diagnosis is mild dementia, likely Alzheimer's dementia. She asked for medication to help with his agitation as he gets very upset when these matters are discussed. Discussed starting Paxil '10mg'$  daily, side effects were discussed. Continue Aricept. She is requesting for a letter detailing the diagnosis so she can sit down and discuss this with her father. 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 He is very adamant he does not want to go to [place]. Daughter will fill out consent form that information be released to the [facility], she is asking our office write a letter about concern for driving from medical standpoint since he family feels he is a danger to himself and others on the road.

## 2015-07-15 NOTE — Telephone Encounter (Signed)
-----   Message from Cameron Sprang, MD sent at 07/15/2015  8:55 AM EDT ----- Pls let daughter know MRI is unremarkable, no evidence of tumor, stroke, or bleed. It shows age-related changes. There are arthritis changes in the neck. Thanks

## 2015-07-16 ENCOUNTER — Encounter: Payer: Self-pay | Admitting: Neurology

## 2015-07-16 NOTE — Telephone Encounter (Signed)
Called daughter, discussed that if we will proceed with DMV report, we need a Medical Driving Assessment first to present to the [facility]. Daughter expressed understanding and requested for clinic visit to discuss diagnosis with patient face to face.

## 2015-07-17 ENCOUNTER — Encounter: Payer: Self-pay | Admitting: Neurology

## 2015-07-17 ENCOUNTER — Ambulatory Visit (INDEPENDENT_AMBULATORY_CARE_PROVIDER_SITE_OTHER): Payer: Medicare Other | Admitting: Neurology

## 2015-07-17 VITALS — BP 146/80 | HR 79 | Resp 16 | Ht 66.75 in | Wt 197.0 lb

## 2015-07-17 DIAGNOSIS — F03A0 Unspecified dementia, mild, without behavioral disturbance, psychotic disturbance, mood disturbance, and anxiety: Secondary | ICD-10-CM

## 2015-07-17 DIAGNOSIS — F0390 Unspecified dementia without behavioral disturbance: Secondary | ICD-10-CM

## 2015-07-17 DIAGNOSIS — F03C0 Unspecified dementia, severe, without behavioral disturbance, psychotic disturbance, mood disturbance, and anxiety: Secondary | ICD-10-CM | POA: Insufficient documentation

## 2015-07-17 DIAGNOSIS — F03B0 Unspecified dementia, moderate, without behavioral disturbance, psychotic disturbance, mood disturbance, and anxiety: Secondary | ICD-10-CM | POA: Insufficient documentation

## 2015-07-17 NOTE — Patient Instructions (Signed)
1. Start Aricept '10mg'$ : Take 1/2 tablet daily for 1 week, then increase to 1 tablet daily 2. Start Paxil '10mg'$ : Take 1 tablet daily 3. Discuss your BP and cholesterol medications with your new doctor, Dr. Yong Channel 4. A medical driving assessment is recommended. The numbers have been given to your daughter 5. Follow-up in 3 months, call our office for any changes

## 2015-07-17 NOTE — Progress Notes (Signed)
NEUROLOGY FOLLOW UP OFFICE NOTE  Jerry [name] [id number]  HISTORY OF PRESENT ILLNESS: I had the pleasure of seeing Jerry [name] in follow-up in the neurology clinic on 07/17/2015.  The patient was last seen 2 weeks ago for worsening memory. His MMSE was 20/30, indicating mild dementia. Records and images were personally reviewed where available.  I personally reviewed MRI brain which did not show any acute changes, there is mild diffuse atrophy and chronic microvascular disease. TSH and B12 normal. 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 His daughter is concerned because she is the only one he trusts, and felt that she could not report her father to the [facility] without "driving a wedge" in their family. She requested for a visit to discuss the diagnosis with the patient. He has not started the Aricept.  In the office today, he kept repeating the same story of how he used to see Jerry [name] as his PCP and how he used to get samples for his BP, how he lost weight with [employer]balife (but kept calling it Vesicare repeatedly).   He denies any headaches, dizziness, diplopia, dysarthria, dysphagia, neck/back pain, focal numbness/tingling/weakness, bowel/bladder dysfunction. No anosmia, tremors, no falls.   HPI: This is a pleasant 79 yo RH man with a history of hypertension, hyperlipidemia, prostate cancer, who presented for evaluation of memory loss. 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 He reports that debt gets heavy due to maintenance issues, and that his daughter visiting him is helping him recently. His daughter unfortunately could not provide any information today for me, she gave me a small note stating that she could not say anything in front of him because she  is the only one he trusts. She did write on the note that he gets lost driving short distances, and repeats himself or asks the same questions within 5 minutes. She expressed that she is afraid for his driving and that he gets angry. The patient himself denies getting lost driving but does agree that he gets turned around sometimes. He uses a GPS mostly. He denies any forgotten bills, but states he has some missed bills because of lack of funds rather than forgetting. He brings his medications and states he only takes the Azor and omega 3 on a daily basis, but occasionally forgets them when he is busy. He is also listed to be taking Lipitor and Vesicare, but he denies taking them. He denies any word-finding difficulties, he denies misplacing things. He does note some difficulty multitasking. He denies any family history of dementia, no history of head injuries. He had stopped alcohol use many years ago.    PAST MEDICAL HISTORY: Past Medical History  Diagnosis Date  . Cancer of prostate   . Diverticulosis   . Hypertension   . DIVERTICULOSIS, COLON 07/25/2007    Qualifier: Diagnosis of  By: [name], LPN, [name]     MEDICATIONS: Current Outpatient Prescriptions on File Prior to Visit  Medication Sig Dispense Refill  . solifenacin (VESICARE) 10 MG tablet Take 5 mg by mouth daily.      Marland Kitchen amLODipine-olmesartan (AZOR) 5-40 MG per tablet Take 1 tablet by mouth daily.      Marland Kitchen atorvastatin (LIPITOR) 40 MG tablet Take 1 tablet (  40 mg total) by mouth daily. (Patient not taking: Reported on 07/02/2015) 90 tablet 3  . donepezil (ARICEPT) 10 MG tablet Take 1/2 tablet daily for 1 week, then increase to 1 tablet daily and continue (Patient not taking: Reported on 07/17/2015) 30 tablet 11  . fluticasone (FLONASE) 50 MCG/ACT nasal spray Place 2 sprays into both nostrils daily. (Patient not taking: Reported on 07/02/2015) 16 g 6  . indomethacin (INDOCIN) 25 MG capsule Take 1 capsule (25 mg total) by mouth 3 (three)  times daily as needed (gout). (Patient not taking: Reported on 07/02/2015) 30 capsule 11  . NON FORMULARY Elite Omega 3, '800mg'$  Omega-3s    . PARoxetine (PAXIL) 10 MG tablet Take 1 tablet (10 mg total) by mouth daily. 30 tablet 3   No current facility-administered medications on file prior to visit.    ALLERGIES: No Known Allergies  FAMILY HISTORY: Family History  Problem Relation Age of Onset  . Cirrhosis    . Early death Father     cirrhosis  . Breast cancer Sister   . Alcoholism Father     SOCIAL HISTORY: History   Social History  . Marital Status: Divorced    Spouse Name: N/A  . Number of Children: N/A  . Years of Education: N/A   Occupational History  . Not on file.   Social History Main Topics  . Smoking status: Former Smoker    Quit date: 12/26/1988  . Smokeless tobacco: Never Used  . Alcohol Use: No  . Drug Use: No  . Sexual Activity: Not Currently   Other Topics Concern  . Not on file   Social History Narrative   Separated since 2010. 4 kids. 4 grandkids. 2 greatgrandkids.       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    REVIEW OF SYSTEMS: Constitutional: No fevers, chills, or sweats, no generalized fatigue, change in appetite Eyes: No visual changes, double vision, eye pain Ear, nose and throat: No hearing loss, ear pain, nasal congestion, sore throat Cardiovascular: No chest pain, palpitations Respiratory:  No shortness of breath at rest or with exertion, wheezes GastrointestinaI: No nausea, vomiting, diarrhea, abdominal pain, fecal incontinence Genitourinary:  No dysuria, urinary retention or frequency Musculoskeletal:  No neck pain, back pain Integumentary: No rash, pruritus, skin lesions Neurological: as above Psychiatric: No depression, insomnia, anxiety Endocrine: No palpitations, fatigue, diaphoresis, mood swings, change in appetite, change in weight, increased thirst Hematologic/Lymphatic:  No anemia,  purpura, petechiae. Allergic/Immunologic: no itchy/runny eyes, nasal congestion, recent allergic reactions, rashes  PHYSICAL EXAM: Filed Vitals:   07/17/15 1025  BP: 146/80  Pulse: 79  Resp: 16   General: No acute distress Head:  Normocephalic/atraumatic Skin/Extremities: No rash, no edema Neurological Exam: alert. No aphasia or dysarthria. Fund of knowledge is appropriate. Attention and concentration are normal.   Cranial nerves: Pupils equal, round. Extraocular movements intact. No facial asymmetry. Motor: Bulk and tone normal, muscle strength 5/5 throughout.  Gait narrow-based and steady.  IMPRESSION: This is a pleasant 79 yo RH man with a history of hypertension, hyperlipidemia, prostate cancer, who presented for worsening memory. His family has expressed this concern, and was very concerned about driving, however the patient has minimal insight into his condition and had refused driving evaluation. We had an extensive discussion today regarding results of MRI, bloodwork, and MMSE, indicating mild dementia, likely Alzheimer's type. I discussed diagnosis, prognosis, and management of dementia, and recommendation for driving assessment. I also discussed  that he may benefit from starting Aricept, prescription was given on last visit. His daughter also expressed concern on the phone for anger issues, I discussed starting Paxil with the patient today to hopefully help with anxiety/mood. Side effects were discussed. He had stopped some of his medications (Lipitor) and was advised to discuss medications with his PCP. He will follow-up in 3 months and knows to call our office for any problems.   Thank you for allowing me to participate in his care.  Please do not hesitate to call for any questions or concerns.  The duration of this appointment visit was 26 minutes of face-to-face time with the patient.  Greater than 50% of this time was spent in counseling, explanation of diagnosis, planning of  further management, and coordination of care.   Ellouise Newer, M.D.   CC: Dr. Yong Channel

## 2015-07-23 ENCOUNTER — Ambulatory Visit: Payer: Medicare Other | Admitting: Neurology

## 2015-07-29 ENCOUNTER — Telehealth: Payer: Self-pay | Admitting: Neurology

## 2015-07-29 NOTE — Telephone Encounter (Signed)
Audree Bane pt daughter called and wants to talk to someone about her father driving and wants to know if Dr Delice Lesch will fill out a form about his driving  [phone]

## 2015-07-29 NOTE — Telephone Encounter (Signed)
Lmovm to return my call. 

## 2015-07-30 ENCOUNTER — Telehealth: Payer: Self-pay | Admitting: Family Medicine

## 2015-07-30 NOTE — Telephone Encounter (Signed)
Pt was given samples of azor 5-40 from dr Arnoldo Morale. Pt need new  rx call into [facility]  [address]  [address]. Pt saw neurologist and she advise pt to start back taking bp med. Pt has Mri  Pt daughter is aware md out of office until monday

## 2015-07-30 NOTE — Telephone Encounter (Signed)
I called and spoke with patient's daughter. Explained to her that patient has to have the driving evaluation 1st the instructor then needs to submit his findings to Dr. Delice Lesch and then she will fill out the DMW forms based on the patient results of the driving evaluation. She states that she will call the driving instructor today to go ahead and get an appt set up for patient.

## 2015-07-30 NOTE — Telephone Encounter (Signed)
Pt poa call back and can be reached after 12:30pm/ [phone]

## 2015-07-31 MED ORDER — AMLODIPINE-OLMESARTAN 5-40 MG PO TABS: 1.0000 | ORAL_TABLET | Freq: Every day | ORAL | Status: DC

## 2015-07-31 NOTE — Telephone Encounter (Signed)
Yes thanks may fill

## 2015-07-31 NOTE — Telephone Encounter (Signed)
Rx sent to phramacy

## 2015-08-10 NOTE — Telephone Encounter (Signed)
Called and spoke with pharmacy and Rx is ready for pick, pt daughter notified.

## 2015-08-10 NOTE — Telephone Encounter (Signed)
Pt daughter states azor is not at [facility]

## 2015-08-12 ENCOUNTER — Telehealth: Payer: Self-pay | Admitting: Family Medicine

## 2015-08-12 ENCOUNTER — Telehealth: Payer: Self-pay | Admitting: Neurology

## 2015-08-12 NOTE — Telephone Encounter (Signed)
I spoke with [name]. She states patient had driving evaluation yesterday. She states the evaluator told her that you would be receiving his report by the end of the week.

## 2015-08-12 NOTE — Telephone Encounter (Signed)
Pt's daughter Solmon Ice called and would like a call back with some new info about her father/Dawn CB# [phone]

## 2015-08-12 NOTE — Telephone Encounter (Signed)
Pt can not afford azor and there is no generic per pt daughter. Pt will need samples or coupons or switch to cheaper medication, azor over 200.00 dollars. [address]

## 2015-08-13 MED ORDER — LISINOPRIL 40 MG PO TABS: 40.0000 mg | ORAL_TABLET | Freq: Every day | ORAL | Status: DC

## 2015-08-13 MED ORDER — AMLODIPINE BESYLATE 5 MG PO TABS: 5.0000 mg | ORAL_TABLET | Freq: Every day | ORAL | Status: DC

## 2015-08-13 NOTE — Telephone Encounter (Signed)
Jerry [name]   Send in  Amlodipine 5 mg #30 5 refills Lisinopril '40mg'$  #30 5 refills  Have him see me within 1-2 weeks of the change for BP check

## 2015-08-13 NOTE — Telephone Encounter (Signed)
We dont have any samples or coupons for Azor. See below.

## 2015-08-13 NOTE — Telephone Encounter (Signed)
Medication sent in, called an lm onpt daughter vm tcb.

## 2015-08-14 ENCOUNTER — Telehealth: Payer: Self-pay | Admitting: Family Medicine

## 2015-08-14 NOTE — Telephone Encounter (Signed)
I spoke with patient's daughter/[name]. Explained that even though patient had the driving evaluation earlier this week. That Dr. Delice Lesch can't submit medical DMV forms to the [facility] without the patient's written permission. Even though patient has Mild Dementia he is cognitive enough to sign his name given permission for his medical information to be submitted for review by the DMV. I explained to her again we can't have her sign this form for him without the patient knowing that the [facility] has been notified of his medical history. Dr. Delice Lesch would like patient to see the actual driving evaluators report so the patient can see for himself what the evaluators recommendations were. She asked that he be scheduled for a f/u to come in and discuss this. Patient has been scheduled for 8/23 @ 9:00.

## 2015-08-18 ENCOUNTER — Encounter: Payer: Self-pay | Admitting: Neurology

## 2015-08-18 ENCOUNTER — Ambulatory Visit (INDEPENDENT_AMBULATORY_CARE_PROVIDER_SITE_OTHER): Payer: Medicare Other | Admitting: Neurology

## 2015-08-18 VITALS — BP 140/78 | HR 75 | Resp 16 | Ht 66.75 in | Wt 199.0 lb

## 2015-08-18 DIAGNOSIS — F0390 Unspecified dementia without behavioral disturbance: Secondary | ICD-10-CM | POA: Diagnosis not present

## 2015-08-18 DIAGNOSIS — F03A0 Unspecified dementia, mild, without behavioral disturbance, psychotic disturbance, mood disturbance, and anxiety: Secondary | ICD-10-CM

## 2015-08-18 NOTE — Progress Notes (Signed)
NEUROLOGY FOLLOW UP OFFICE NOTE  Jerry [name] [id number]  HISTORY OF PRESENT ILLNESS: I had the pleasure of seeing Jerry [name] in follow-up in the neurology clinic on 08/18/2015.  The patient was last seen a month ago for mild dementia, MMSE 20/30. He is again accompanied by his daughter who helps supplement the history today.  Records and images were personally reviewed where available. Since his last visit, he underwent an OT driving evaluation which his daughter wished to have discussed personally with the patient today, as the patient has minimal insight into his condition and would like to continue driving. Per OT evaluation report, he presents with deficits with divided attention and memory. He is continually distracted by the surrounding environment as he drives which caused him to weave outside his lane, drive too slowly and accelerate the vehicle in an uneven manner. He was not fully focused on driving and does not always appear to know where he is going. He also appears to have deficits with memory as he forgot to drive to the [employer] as instructed and needed to ask the evaluator on multiple occasions where he should drive and/or turn. This places him at an increased risk of a motor vehicle accident. 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 The following restrictions were given: Daytime driving only, no interstate/freeway driving, no speeds over 45 mph, 5 mile radius from his residence.  Jerry [name] reports that he feels that the evaluation did not do him justice, reporting that the road was under construction. He however repeated the same explanation three times in the office today.   He denies any headaches, dizziness, diplopia, dysarthria, dysphagia, neck/back pain, focal numbness/tingling/weakness, bowel/bladder dysfunction. No falls.    HPI: This is a 79 yo RH man with a history of hypertension, hyperlipidemia, prostate cancer, who presented for evaluation of memory loss. 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 He reports that debt gets heavy due to maintenance issues, and that his daughter visiting him is helping him recently. His daughter unfortunately could not provide any information today for me, she gave me a small note stating that she could not say anything in front of him because she is the only one he trusts. She did write on the note that he gets lost driving short distances, and repeats himself or asks the same questions within 5 minutes. She expressed that she is afraid for his driving and that he gets angry. The patient himself denies getting lost driving but does agree that he gets turned around sometimes. He uses a GPS mostly. He denies any forgotten bills, but states he has some missed bills because of lack of funds rather than forgetting. He brings his medications and states he only takes the Azor and omega 3 on a daily basis, but occasionally forgets them when he is busy. He is also listed to be taking Lipitor and Vesicare, but he denies taking them. He denies any word-finding difficulties, he denies misplacing things. He does note some difficulty multitasking. He denies any family history of dementia, no history of head injuries. He had stopped alcohol use many years ago.   Diagnostic Data: I personally reviewed MRI brain which did not show any acute changes, there is mild diffuse atrophy and  chronic microvascular disease. TSH and B12 normal.   PAST MEDICAL HISTORY: Past Medical History  Diagnosis Date  . Cancer of prostate   . Diverticulosis   . Hypertension   . DIVERTICULOSIS, COLON 07/25/2007    Qualifier: Diagnosis of  By: [name] LPN, [name]     MEDICATIONS: Current Outpatient  Prescriptions on File Prior to Visit  Medication Sig Dispense Refill  . donepezil (ARICEPT) 10 MG tablet Take 1/2 tablet daily for 1 week, then increase to 1 tablet daily and continue 30 tablet 11  . amLODipine (NORVASC) 5 MG tablet Take 1 tablet (5 mg total) by mouth daily. 30 tablet 5  . amLODipine-olmesartan (AZOR) 5-40 MG per tablet Take 1 tablet by mouth daily. 30 tablet 5  . atorvastatin (LIPITOR) 40 MG tablet Take 1 tablet (40 mg total) by mouth daily. (Patient not taking: Reported on 07/02/2015) 90 tablet 3  . fluticasone (FLONASE) 50 MCG/ACT nasal spray Place 2 sprays into both nostrils daily. (Patient not taking: Reported on 07/02/2015) 16 g 6  . indomethacin (INDOCIN) 25 MG capsule Take 1 capsule (25 mg total) by mouth 3 (three) times daily as needed (gout). (Patient not taking: Reported on 07/02/2015) 30 capsule 11  . lisinopril (PRINIVIL,ZESTRIL) 40 MG tablet Take 1 tablet (40 mg total) by mouth daily. 30 tablet 5  . NON FORMULARY Elite Omega 3, '800mg'$  Omega-3s    . PARoxetine (PAXIL) 10 MG tablet Take 1 tablet (10 mg total) by mouth daily. 30 tablet 3  . solifenacin (VESICARE) 10 MG tablet Take 5 mg by mouth daily.       No current facility-administered medications on file prior to visit.    ALLERGIES: No Known Allergies  FAMILY HISTORY: Family History  Problem Relation Age of Onset  . Cirrhosis    . Early death Father     cirrhosis  . Breast cancer Sister   . Alcoholism Father     SOCIAL HISTORY: Social History   Social History  . Marital Status: Divorced    Spouse Name: N/A  . Number of Children: N/A  . Years of Education: N/A   Occupational History  . Not on file.   Social History Main Topics  . Smoking status: Former Smoker    Quit date: 12/26/1988  . Smokeless tobacco: Never Used  . Alcohol Use: No  . Drug Use: No  . Sexual Activity: Not Currently   Other Topics Concern  . Not on file   Social History Narrative   Separated since 2010. 4 kids. 4  grandkids. 2 greatgrandkids.       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    REVIEW OF SYSTEMS: Constitutional: No fevers, chills, or sweats, no generalized fatigue, change in appetite Eyes: No visual changes, double vision, eye pain Ear, nose and throat: No hearing loss, ear pain, nasal congestion, sore throat Cardiovascular: No chest pain, palpitations Respiratory:  No shortness of breath at rest or with exertion, wheezes GastrointestinaI: No nausea, vomiting, diarrhea, abdominal pain, fecal incontinence Genitourinary:  No dysuria, urinary retention or frequency Musculoskeletal:  No neck pain, back pain Integumentary: No rash, pruritus, skin lesions Neurological: as above Psychiatric: No depression, insomnia, anxiety Endocrine: No palpitations, fatigue, diaphoresis, mood swings, change in appetite, change in weight, increased thirst Hematologic/Lymphatic:  No anemia, purpura, petechiae. Allergic/Immunologic: no itchy/runny eyes, nasal congestion, recent allergic reactions, rashes  PHYSICAL EXAM: Filed Vitals:   08/18/15 0854  BP: 140/78  Pulse: 75  Resp: 16   General: No acute distress Head:  Normocephalic/atraumatic Skin/Extremities: No rash, no edema Neurological Exam: alert and oriented to person, place. No aphasia or dysarthria. Fund of knowledge is appropriate.  Recent and remote memory are impaired.  Attention and concentration are normal.    Able to name objects and repeat phrases. Cranial nerves: Pupils equal, round. Extraocular movements intact with no nystagmus. No facial asymmetry. Motor: Bulk and tone normal, muscle strength 5/5 throughout. Gait narrow-based and steady.  IMPRESSION: This is a 79 yo RH man with a history of hypertension, hyperlipidemia, prostate cancer, with mild dementia. His daughter expressed concerns about driving, but is afraid of reporting him to the [facility], as this "might drive a wedge" into their relationship (she  is the only one he trusts). He has minimal insight into his condition, and underwent an OT driving assessment, which showed deficits with divided attention and memory. 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 I had an extensive discussion with the patient today regarding the results of the evaluation, he feels he was not done justice, and would like a re-evaluation. I discussed with him driving restrictions, and having the [facility] Medical Advisory Board review his case. He agrees to this and signed release of information to [facility] today. He will follow-up in 6 months and knows to call our office for any problems.   Thank you for allowing me to participate in his care.  Please do not hesitate to call for any questions or concerns.  The duration of this appointment visit was 24 minutes of face-to-face time with the patient.  Greater than 50% of this time was spent in counseling, explanation of diagnosis, planning of further management, and coordination of care.   Ellouise Newer, M.D.   CC: Dr. Yong Channel

## 2015-08-18 NOTE — Patient Instructions (Addendum)
1. Recommend DMV forms be sent to the [facility] Medical Advisory Board to review. You can ask for a hearing to let them know your concerns after you get their letter.  2. Recommend the following driving restrictions: -no interstate/freeway driving, no speeds over 45 mph -5 mile radius from your residence - daylight driving only, no nighttime driving  3. Continue all your current medications  4. Follow-up in 6 months

## 2015-10-16 ENCOUNTER — Ambulatory Visit: Payer: Self-pay | Admitting: Neurology

## 2015-12-22 ENCOUNTER — Ambulatory Visit: Payer: Self-pay | Admitting: Neurology

## 2016-02-18 ENCOUNTER — Ambulatory Visit: Payer: Self-pay | Admitting: Neurology

## 2016-02-18 DIAGNOSIS — Z029 Encounter for administrative examinations, unspecified: Secondary | ICD-10-CM

## 2016-08-01 ENCOUNTER — Other Ambulatory Visit (INDEPENDENT_AMBULATORY_CARE_PROVIDER_SITE_OTHER): Payer: Medicare Other

## 2016-08-01 ENCOUNTER — Other Ambulatory Visit: Payer: Medicare Other

## 2016-08-01 DIAGNOSIS — Z0000 Encounter for general adult medical examination without abnormal findings: Secondary | ICD-10-CM | POA: Diagnosis not present

## 2016-08-01 DIAGNOSIS — R319 Hematuria, unspecified: Secondary | ICD-10-CM | POA: Diagnosis not present

## 2016-08-01 LAB — BASIC METABOLIC PANEL
BUN: 10 mg/dL (ref 6–23)
CHLORIDE: 106 meq/L (ref 96–112)
CO2: 26 mEq/L (ref 19–32)
Calcium: 9.1 mg/dL (ref 8.4–10.5)
Creatinine, Ser: 1.2 mg/dL (ref 0.40–1.50)
GFR: 75.29 mL/min (ref >60.00)
GLUCOSE: 82 mg/dL (ref 70–99)
POTASSIUM: 4.2 meq/L (ref 3.5–5.1)
Sodium: 140 mEq/L (ref 135–145)

## 2016-08-01 LAB — POC URINALSYSI DIPSTICK (AUTOMATED)
BILIRUBIN UA: NEGATIVE
Glucose, UA: NEGATIVE
KETONES UA: NEGATIVE
Nitrite, UA: POSITIVE
PROTEIN UA: NEGATIVE
Spec Grav, UA: 1.01
Urobilinogen, UA: 0.2
pH, UA: 5.5

## 2016-08-01 LAB — CBC WITH DIFFERENTIAL/PLATELET
BASOS PCT: 0.7 % (ref 0.0–3.0)
Basophils Absolute: 0.1 10*3/uL (ref 0.0–0.1)
EOS PCT: 1.9 % (ref 0.0–5.0)
Eosinophils Absolute: 0.1 10*3/uL (ref 0.0–0.7)
HCT: 40.5 % (ref 39.0–52.0)
HEMOGLOBIN: 13.5 g/dL (ref 13.0–17.0)
LYMPHS ABS: 2.8 10*3/uL (ref 0.7–4.0)
Lymphocytes Relative: 38.5 % (ref 12.0–46.0)
MCHC: 33.3 g/dL (ref 30.0–36.0)
MCV: 87.6 fl (ref 78.0–100.0)
MONO ABS: 0.4 10*3/uL (ref 0.1–1.0)
MONOS PCT: 5.3 % (ref 3.0–12.0)
Neutro Abs: 3.9 10*3/uL (ref 1.4–7.7)
Neutrophils Relative %: 53.6 % (ref 43.0–77.0)
Platelets: 231 10*3/uL (ref 150.0–400.0)
RBC: 4.63 Mil/uL (ref 4.22–5.81)
RDW: 14.5 % (ref 11.5–15.5)
WBC: 7.2 10*3/uL (ref 4.0–10.5)

## 2016-08-01 LAB — URINALYSIS, MICROSCOPIC ONLY

## 2016-08-01 LAB — HEPATIC FUNCTION PANEL
ALT: 10 U/L (ref 0–53)
AST: 14 U/L (ref 0–37)
Albumin: 3.9 g/dL (ref 3.5–5.2)
Alkaline Phosphatase: 77 U/L (ref 39–117)
BILIRUBIN DIRECT: 0.1 mg/dL (ref 0.0–0.3)
BILIRUBIN TOTAL: 0.6 mg/dL (ref 0.2–1.2)
Total Protein: 7.5 g/dL (ref 6.0–8.3)

## 2016-08-01 LAB — TSH: TSH: 0.62 u[IU]/mL (ref 0.35–4.50)

## 2016-08-01 LAB — LIPID PANEL
CHOL/HDL RATIO: 6
Cholesterol: 203 mg/dL — ABNORMAL HIGH (ref 0–200)
HDL: 33.4 mg/dL — AB (ref >39.00)
LDL CALC: 150 mg/dL — AB (ref 0–99)
NonHDL: 169.95
TRIGLYCERIDES: 102 mg/dL (ref 0.0–149.0)
VLDL: 20.4 mg/dL (ref 0.0–40.0)

## 2016-08-01 LAB — PSA: PSA: 0.05 ng/mL — ABNORMAL LOW (ref 0.10–4.00)

## 2016-08-03 ENCOUNTER — Telehealth: Payer: Self-pay | Admitting: Family Medicine

## 2016-08-03 NOTE — Telephone Encounter (Signed)
[facility] - Client [place] [facility] Patient Name: [name] DOB: Jul 06, 1936 Initial Comment Caller states father c/o constipation. Nurse Assessment Guidelines Guideline Title Affirmed Question Affirmed Notes Final Disposition User FINAL ATTEMPT MADE - message left [place], Therapist, sports, Dellis Filbert

## 2016-08-03 NOTE — Telephone Encounter (Signed)
Per [facility] [employer], pt's daughter called with c/o of pt having constipation. [facility] called pt with no answer. Please Advise.

## 2016-08-04 ENCOUNTER — Encounter: Payer: Self-pay | Admitting: Adult Health

## 2016-08-04 ENCOUNTER — Ambulatory Visit (INDEPENDENT_AMBULATORY_CARE_PROVIDER_SITE_OTHER): Payer: Medicare Other | Admitting: Adult Health

## 2016-08-04 VITALS — BP 164/80 | HR 68 | Temp 98.3°F | Ht 66.75 in | Wt 191.8 lb

## 2016-08-04 DIAGNOSIS — K640 First degree hemorrhoids: Secondary | ICD-10-CM | POA: Diagnosis not present

## 2016-08-04 LAB — URINE CULTURE: Colony Count: >=100000

## 2016-08-04 NOTE — Progress Notes (Signed)
   Subjective:    Patient ID: Jerry [name], male    DOB: 1936-06-17, 80 y.o.   MRN: [id number]  HPI  80 year old male, patient who presents to the office today for an acute complaint. He reports that he " felt a bump around my rectum." He wanted to make sure that it was nothing he needed to worry about.   Denies any pain or bleeding. He has not been constipated lately.    Review of Systems  Constitutional: Negative.   HENT: Negative.   Eyes: Negative.   Respiratory: Negative.   Endocrine: Negative.   Genitourinary: Negative.   Neurological: Negative.   All other systems reviewed and are negative.      Objective:   Physical Exam  Constitutional: He is oriented to person, place, and time. He appears well-developed and well-nourished.  Cardiovascular: Normal rate, regular rhythm, normal heart sounds and intact distal pulses.  Exam reveals no gallop and no friction rub.   No murmur heard. Pulmonary/Chest: Effort normal and breath sounds normal. No respiratory distress. He has no rales.  Abdominal: Soft. Bowel sounds are normal. He exhibits no distension and no mass. There is no tenderness. There is no rebound and no guarding.  Genitourinary: Rectal exam shows external hemorrhoid. Rectal exam shows no internal hemorrhoid, no mass, no tenderness, anal tone normal and guaiac negative stool.  Neurological: He is alert and oriented to person, place, and time.  Skin: Skin is warm and dry. No rash noted. He is not diaphoretic. No erythema. No pallor.  Psychiatric: He has a normal mood and affect. His behavior is normal. Thought content normal.  Nursing note and vitals reviewed.      Assessment & Plan:  1. First degree hemorrhoids - Not engorged - No bleeding noted - Advised OTC hemorrhoid pads - Metamucil daily to help with BM - Do not strain or sit on toilet for long periods of time.   Dorothyann Peng, NP

## 2016-08-04 NOTE — Patient Instructions (Addendum)
It was great meeting you today!  What you felt was a hemorrhoid, these are very common.   You can get Tucks or Preperation H medicated pads at any drug store to help shrink them   Hemorrhoids Hemorrhoids are swollen veins around the rectum or anus. There are two types of hemorrhoids:   Internal hemorrhoids. These occur in the veins just inside the rectum. They may poke through to the outside and become irritated and painful.  External hemorrhoids. These occur in the veins outside the anus and can be felt as a painful swelling or hard lump near the anus. CAUSES  Pregnancy.   Obesity.   Constipation or diarrhea.   Straining to have a bowel movement.   Sitting for long periods on the toilet.  Heavy lifting or other activity that caused you to strain.  Anal intercourse. SYMPTOMS   Pain.   Anal itching or irritation.   Rectal bleeding.   Fecal leakage.   Anal swelling.   One or more lumps around the anus.  DIAGNOSIS  Your caregiver may be able to diagnose hemorrhoids by visual examination. Other examinations or tests that may be performed include:   Examination of the rectal area with a gloved hand (digital rectal exam).   Examination of anal canal using a small tube (scope).   A blood test if you have lost a significant amount of blood.  A test to look inside the colon (sigmoidoscopy or colonoscopy). TREATMENT Most hemorrhoids can be treated at home. However, if symptoms do not seem to be getting better or if you have a lot of rectal bleeding, your caregiver may perform a procedure to help make the hemorrhoids get smaller or remove them completely. Possible treatments include:   Placing a rubber band at the base of the hemorrhoid to cut off the circulation (rubber band ligation).   Injecting a chemical to shrink the hemorrhoid (sclerotherapy).   Using a tool to burn the hemorrhoid (infrared light therapy).   Surgically removing the hemorrhoid  (hemorrhoidectomy).   Stapling the hemorrhoid to block blood flow to the tissue (hemorrhoid stapling).  HOME CARE INSTRUCTIONS   Eat foods with fiber, such as whole grains, beans, nuts, fruits, and vegetables. Ask your doctor about taking products with added fiber in them (fibersupplements).  Increase fluid intake. Drink enough water and fluids to keep your urine clear or pale yellow.   Exercise regularly.   Go to the bathroom when you have the urge to have a bowel movement. Do not wait.   Avoid straining to have bowel movements.   Keep the anal area dry and clean. Use wet toilet paper or moist towelettes after a bowel movement.   Medicated creams and suppositories may be used or applied as directed.   Only take over-the-counter or prescription medicines as directed by your caregiver.   Take warm sitz baths for 15-20 minutes, 3-4 times a day to ease pain and discomfort.   Place ice packs on the hemorrhoids if they are tender and swollen. Using ice packs between sitz baths may be helpful.   Put ice in a plastic bag.   Place a towel between your skin and the bag.   Leave the ice on for 15-20 minutes, 3-4 times a day.   Do not use a donut-shaped pillow or sit on the toilet for long periods. This increases blood pooling and pain.  SEEK MEDICAL CARE IF:  You have increasing pain and swelling that is not controlled by treatment or  medicine.  You have uncontrolled bleeding.  You have difficulty or you are unable to have a bowel movement.  You have pain or inflammation outside the area of the hemorrhoids. MAKE SURE YOU:  Understand these instructions.  Will watch your condition.  Will get help right away if you are not doing well or get worse.   This information is not intended to replace advice given to you by your health care provider. Make sure you discuss any questions you have with your health care provider.   Document Released: 12/09/2000 Document  Revised: 11/28/2012 Document Reviewed: 10/16/2012 Elsevier Interactive Patient Education [employer].

## 2016-08-04 NOTE — Telephone Encounter (Signed)
Called daughter's number listed and left a voicemail message for a return phone call to discuss with her

## 2016-08-04 NOTE — Progress Notes (Signed)
Pre visit review using our clinic review tool, if applicable. No additional management support is needed unless otherwise documented below in the visit note. 

## 2016-08-05 ENCOUNTER — Other Ambulatory Visit: Payer: Self-pay | Admitting: Family Medicine

## 2016-08-05 MED ORDER — AMOXICILLIN-POT CLAVULANATE 875-125 MG PO TABS: 1.0000 | ORAL_TABLET | Freq: Two times a day (BID) | ORAL | 0 refills | Status: DC

## 2016-08-05 NOTE — Progress Notes (Signed)
augmentin for uti

## 2016-08-05 NOTE — Telephone Encounter (Signed)
Spoke with daughter Cristopher Estimable on the phone. Patient has an appointment to be seen on Monday

## 2016-08-08 ENCOUNTER — Ambulatory Visit (INDEPENDENT_AMBULATORY_CARE_PROVIDER_SITE_OTHER): Payer: Medicare Other | Admitting: Family Medicine

## 2016-08-08 ENCOUNTER — Encounter: Payer: Self-pay | Admitting: Family Medicine

## 2016-08-08 VITALS — BP 170/80 | HR 74 | Temp 98.2°F | Ht 66.5 in | Wt 190.2 lb

## 2016-08-08 DIAGNOSIS — F03A0 Unspecified dementia, mild, without behavioral disturbance, psychotic disturbance, mood disturbance, and anxiety: Secondary | ICD-10-CM

## 2016-08-08 DIAGNOSIS — R413 Other amnesia: Secondary | ICD-10-CM

## 2016-08-08 DIAGNOSIS — F0390 Unspecified dementia without behavioral disturbance: Secondary | ICD-10-CM

## 2016-08-08 DIAGNOSIS — Z0001 Encounter for general adult medical examination with abnormal findings: Secondary | ICD-10-CM | POA: Diagnosis not present

## 2016-08-08 DIAGNOSIS — E785 Hyperlipidemia, unspecified: Secondary | ICD-10-CM

## 2016-08-08 DIAGNOSIS — Z0000 Encounter for general adult medical examination without abnormal findings: Secondary | ICD-10-CM

## 2016-08-08 DIAGNOSIS — T185XXA Foreign body in anus and rectum, initial encounter: Secondary | ICD-10-CM | POA: Diagnosis not present

## 2016-08-08 DIAGNOSIS — I10 Essential (primary) hypertension: Secondary | ICD-10-CM

## 2016-08-08 MED ORDER — ATORVASTATIN CALCIUM 40 MG PO TABS: 40.0000 mg | ORAL_TABLET | Freq: Every day | ORAL | 5 refills | Status: DC

## 2016-08-08 MED ORDER — DONEPEZIL HCL 5 MG PO TABS: ORAL_TABLET | ORAL | 5 refills | Status: DC

## 2016-08-08 MED ORDER — AMLODIPINE-OLMESARTAN 5-40 MG PO TABS: 1.0000 | ORAL_TABLET | Freq: Every day | ORAL | 5 refills | Status: DC

## 2016-08-08 NOTE — Assessment & Plan Note (Signed)
S: poorly controlled on no medication- stopped taking. No myalgias.  Lab Results  Component Value Date   CHOL 203 (H) 08/01/2016   HDL 33.40 (L) 08/01/2016   LDLCALC 150 (H) 08/01/2016   TRIG 102.0 08/01/2016   CHOLHDL 6 08/01/2016   A/P: restart atorvastatin '40mg'$  at this point. Discussed with daughter importance of following up with him on his medications.

## 2016-08-08 NOTE — Progress Notes (Signed)
Pre visit review using our clinic review tool, if applicable. No additional management support is needed unless otherwise documented below in the visit note. 

## 2016-08-08 NOTE — Patient Instructions (Addendum)
Start antibiotic for 10 days (augmentin)  Restart 3 medications on chronic medication listed below

## 2016-08-08 NOTE — Progress Notes (Signed)
Phone: [phone]  Subjective:  Patient presents today for their annual physical. Chief complaint-noted.   See problem oriented charting- ROS- full  review of systems was completed and negative except for: feeling something hard in his rectum. Bump on outside of his rectum feels better- hemorrhoid treated last week.   The following were reviewed and entered/updated in epic: Past Medical History:  Diagnosis Date  . Cancer of prostate ([place])   . Diverticulosis   . DIVERTICULOSIS, COLON 07/25/2007   Qualifier: Diagnosis of  By: [name], LPN, [name]   . Hypertension    Patient Active Problem List   Diagnosis Date Noted  . Mild dementia 07/17/2015    Priority: High  . Hyperlipidemia 10/15/2014    Priority: Medium  . Overactive bladder 10/15/2014    Priority: Medium  . Gout of big toe 08/04/2008    Priority: Medium  . Essential hypertension 07/25/2007    Priority: Medium  . PROSTATE CANCER, HX OF 07/25/2007    Priority: Medium  . Allergic rhinitis 10/15/2014    Priority: Low  . CARDIAC MURMUR 01/10/2011    Priority: Low  . ORGANIC IMPOTENCE 08/12/2010    Priority: Low  . Memory loss 05/08/2015   Past Surgical History:  Procedure Laterality Date  . penile pump implant     patient denies  . PROSTATECTOMY     seed implant    Family History  Problem Relation Age of Onset  . Cirrhosis    . Early death Father     cirrhosis  . Breast cancer Sister   . Alcoholism Father     Medications- reviewed and updated. Patient actually states he is taking no medication at this time Current Outpatient Prescriptions  Medication Sig Dispense Refill  . amLODipine (NORVASC) 5 MG tablet Take 1 tablet (5 mg total) by mouth daily. 30 tablet 5  . amLODipine-olmesartan (AZOR) 5-40 MG per tablet Take 1 tablet by mouth daily. 30 tablet 5  . donepezil (ARICEPT) 10 MG tablet Take 1/2 tablet daily for 1 week, then increase to 1 tablet daily and continue 30 tablet 11  . fluticasone  (FLONASE) 50 MCG/ACT nasal spray Place 2 sprays into both nostrils daily. 16 g 6  . indomethacin (INDOCIN) 25 MG capsule Take 1 capsule (25 mg total) by mouth 3 (three) times daily as needed (gout). 30 capsule 11  . lisinopril (PRINIVIL,ZESTRIL) 40 MG tablet Take 1 tablet (40 mg total) by mouth daily. 30 tablet 5  . NON FORMULARY Elite Omega 3, '800mg'$  Omega-3s    . PARoxetine (PAXIL) 10 MG tablet Take 1 tablet (10 mg total) by mouth daily. 30 tablet 3  . solifenacin (VESICARE) 10 MG tablet Take 5 mg by mouth daily.      Marland Kitchen atorvastatin (LIPITOR) 40 MG tablet Take 1 tablet (40 mg total) by mouth daily. (Patient not taking: Reported on 07/02/2015) 90 tablet 3   No current facility-administered medications for this visit.     Allergies-reviewed and updated No Known Allergies  Social History   Social History  . Marital status: Divorced    Spouse name: N/A  . Number of children: N/A  . Years of education: N/A   Social History Main Topics  . Smoking status: Former Smoker    Quit date: 12/26/1988  . Smokeless tobacco: Never Used  . Alcohol use No  . Drug use: No  . Sexual activity: Not Currently   Other Topics Concern  . None   Social History Narrative   Separated  since 2010. 4 kids. 4 grandkids. 2 greatgrandkids.       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 Dementia unfortunately stopped these travels.     Objective: BP (!) 170/80 (BP Location: Left Arm, Patient Position: Sitting, Cuff Size: Large)   Pulse 74   Temp 98.2 F (36.8 C) (Oral)   Ht 5' 6.5" (1.689 m)   Wt 190 lb 3.2 oz (86.3 kg)   SpO2 98%   BMI 30.24 kg/m  Gen: NAD, resting comfortably HEENT: Mucous membranes are moist. Oropharynx normal Neck: no thyromegaly CV: RRR 3/6 SEM RUSB (known aortic sclerosis) murmurs rubs or gallops Lungs: CTAB no crackles, wheeze, rhonchi Abdomen: soft/nontender/nondistended/normal bowel sounds. No rebound or guarding.  Ext: no  edema Skin: warm, dry Neuro: grossly normal, moves all extremities, PERRLA  Rectal: Patient with small hemorrhoidal tag about 9 o clock. On internal exam- rubbery substance was felt- this was milked down over a minute and then a firmer piece of plastic was felt which was able to be hooked with finger and pulled down- small tube was pulled out then retrieved rest of piece. This was cleaned by our RN and determined to be an enema that was fully placed inside the rectum.         Assessment/Plan:  80 y.o. male presenting for annual physical.  Health Maintenance counseling: 1. Anticipatory guidance: Patient counseled regarding regular dental exams, eye exams, wearing seatbelts.  2. Risk factor reduction:  Advised patient of need for regular exercise and diet rich and fruits and vegetables to reduce risk of heart attack and stroke.  3. Immunizations/screenings/ancillary studies Immunization History  Administered Date(s) Administered  . Influenza Whole 11/06/2007, 10/07/2008, 12/09/2010  . Influenza,inj,Quad PF,36+ Mos 10/15/2014  . Pneumococcal Conjugate-13 05/08/2015  . Pneumococcal Polysaccharide-23 11/06/2007  . Td 12/26/2006   Health Maintenance Due  Topic Date Due  . ZOSTAVAX - declines 07/11/1998  . INFLUENZA VACCINE - advised to get when available 07/26/2016   4. Prostate cancer screening- history of prostate cancer followed by [facility]. OAB on vesicare  Lab Results  Component Value Date   PSA 0.05 (L) 08/01/2016   5. Colon cancer screening - no record of colonoscopy  Status of chronic or acute concerns   Essential hypertension S:  Very poorly controlled on no rx- unclear how long ago he stopped taking amlodipine-olmesartan 5-'40mg'$  and lisinopril '40mg'$   BP Readings from Last 3 Encounters:  08/08/16 (!) 170/80  08/04/16 (!) 164/80  08/18/15 140/78  A/P: restart amlodipine olmesartan 5-'40mg'$  alone for now with follow up in 2 weeks and decide if will restart lisinopril at  that time.    Hyperlipidemia S: poorly controlled on no medication- stopped taking. No myalgias.  Lab Results  Component Value Date   CHOL 203 (H) 08/01/2016   HDL 33.40 (L) 08/01/2016   LDLCALC 150 (H) 08/01/2016   TRIG 102.0 08/01/2016   CHOLHDL 6 08/01/2016   A/P: restart atorvastatin '40mg'$  at this point. Discussed with daughter importance of following up with him on his medications.    Mild dementia S: family thinks patient may have stopped meds because he was worried about side effects from the aricept as he "felt different". Unclear if he was taking full pill or how much.  A/P: we will start back on '5mg'$  of aricept and have advised patient neuro follow up. Discussed really needed family conference. Sister out of state is HCPOA and does not share all information  but family locally is who supports him. Patient lives alone though family comes in to help- discussed they are going to need him for medication management and following up with appointments- discussed with him not being seen in well over a year and stopping meds we need to see each other at least every 4 months or so.  Patient agreeable at least to add local daughter to [facility]. She is also going to help make sure he takes his medications including one for UTI recently sent in for physical labs.    Rectal discomfort Foreign body Hemorrhoid S: Patient was seen last week about "a bump around my rectum". This was evaluated and determined to be a hemorrhoid. He was treated with OTC hemorrhoid pads which he states gave him relief. Patient states he also has stuck his finger up his rectum and felt a hard bump on the inside. He states he has had some recent constipation since last week- none prior. He has tried magnesium citrate without relief. Has been several days since he has had a bowel movement. He perseverates on multiple occassions about the fact that he fell backwards on a tractor and hit his rectum- and wonders if this could have  caused his issues.  A/P: Foreign body was removed during rectal exam. Appears to be a enema that was completley placed within the rectum instead of only the portion indicated. Patient fiercely denies doing this and required extended counseling as a result. Also brought family in to explain situation. Before this was felt plan was miralax but afterwards discussed would not do this- but follow up in 2 weeks for repeat eval along with BP.   We will remain off paxil and vesicare and indocin for now  The duration of face-to-face time during this visit was 60 minutes. The first 30 minutes was spent on his physical, the remaining 30 minutes was dedicated to extended counseling on foreign body noted above. Greater than 50% of this time was spent in counseling, explanation of diagnosis, planning of further management, and/or coordination of care.   Return in about 2 weeks (around 08/22/2016).  Meds ordered this encounter  Medications  . amLODipine-olmesartan (AZOR) 5-40 MG tablet    Sig: Take 1 tablet by mouth daily.    Dispense:  30 tablet    Refill:  5  . atorvastatin (LIPITOR) 40 MG tablet    Sig: Take 1 tablet (40 mg total) by mouth daily.    Dispense:  30 tablet    Refill:  5  . DISCONTD: donepezil (ARICEPT) 5 MG tablet    Sig: Take 1/2 tablet daily for 1 week, then increase to 1 tablet daily and continue    Dispense:  30 tablet    Refill:  5  . donepezil (ARICEPT) 5 MG tablet    Sig: Take once a day    Dispense:  30 tablet    Refill:  5    Return precautions advised.   Garret Reddish, MD

## 2016-08-08 NOTE — Assessment & Plan Note (Signed)
S:  Very poorly controlled on no rx- unclear how long ago he stopped taking amlodipine-olmesartan 5-'40mg'$  and lisinopril '40mg'$   BP Readings from Last 3 Encounters:  08/08/16 (!) 170/80  08/04/16 (!) 164/80  08/18/15 140/78  A/P: restart amlodipine olmesartan 5-'40mg'$  alone for now with follow up in 2 weeks and decide if will restart lisinopril at that time.

## 2016-08-09 ENCOUNTER — Encounter: Payer: Self-pay | Admitting: Family Medicine

## 2016-08-09 NOTE — Assessment & Plan Note (Signed)
S: family thinks patient may have stopped meds because he was worried about side effects from the aricept as he "felt different". Unclear if he was taking full pill or how much.  A/P: we will start back on '5mg'$  of aricept and have advised patient neuro follow up. Discussed really needed family conference. Sister out of state is HCPOA and does not share all information but family locally is who supports him. Patient lives alone though family comes in to help- discussed they are going to need him for medication management and following up with appointments- discussed with him not being seen in well over a year and stopping meds we need to see each other at least every 4 months or so.  Patient agreeable at least to add local daughter to [facility]. She is also going to help make sure he takes his medications including one for UTI recently sent in for physical labs.

## 2016-08-22 ENCOUNTER — Encounter: Payer: Self-pay | Admitting: Family Medicine

## 2016-08-22 ENCOUNTER — Ambulatory Visit (INDEPENDENT_AMBULATORY_CARE_PROVIDER_SITE_OTHER): Payer: Medicare Other | Admitting: Family Medicine

## 2016-08-22 VITALS — BP 142/72 | HR 66 | Temp 98.0°F | Wt 194.2 lb

## 2016-08-22 DIAGNOSIS — I10 Essential (primary) hypertension: Secondary | ICD-10-CM

## 2016-08-22 DIAGNOSIS — Z23 Encounter for immunization: Secondary | ICD-10-CM

## 2016-08-22 DIAGNOSIS — F0390 Unspecified dementia without behavioral disturbance: Secondary | ICD-10-CM

## 2016-08-22 DIAGNOSIS — R3589 Other polyuria: Secondary | ICD-10-CM

## 2016-08-22 DIAGNOSIS — R358 Other polyuria: Secondary | ICD-10-CM

## 2016-08-22 DIAGNOSIS — F03A0 Unspecified dementia, mild, without behavioral disturbance, psychotic disturbance, mood disturbance, and anxiety: Secondary | ICD-10-CM

## 2016-08-22 DIAGNOSIS — E785 Hyperlipidemia, unspecified: Secondary | ICD-10-CM

## 2016-08-22 NOTE — Progress Notes (Signed)
Pre visit review using our clinic review tool, if applicable. No additional management support is needed unless otherwise documented below in the visit note. 

## 2016-08-22 NOTE — Assessment & Plan Note (Signed)
S: suspect improved control back on atorvastatin '40mg'$ . No myalgias.  Lab Results  Component Value Date   CHOL 203 (H) 08/01/2016   HDL 33.40 (L) 08/01/2016   LDLCALC 150 (H) 08/01/2016   TRIG 102.0 08/01/2016   CHOLHDL 6 08/01/2016   A/P: continue current medication as tolerating well and no signs at this point of SE. Monitor cbg with labs today fasting

## 2016-08-22 NOTE — Assessment & Plan Note (Signed)
S: improved controll back on amlodipine 5-'40mg'$ . Lisinopril in past BP Readings from Last 3 Encounters:  08/22/16 (!) 142/72  08/08/16 (!) 170/80  08/04/16 (!) 164/80  A/P:Continue current meds:  We discussed tighter control goal but without CAD or CVA history- patient and family concerned about dizziness/falls with tighter control so we opted for <150/90 goal

## 2016-08-22 NOTE — Progress Notes (Signed)
Subjective:  JAAN FISCHEL is a 80 y.o. year old very pleasant male patient who presents for/with See problem oriented charting ROS- no further rectal discomfort. No chest pain or shortness of breath. No headache or blurry vision. see any ROS included in HPI as well.   Past Medical History-  Patient Active Problem List   Diagnosis Date Noted  . Mild dementia 07/17/2015    Priority: High  . Hyperlipidemia 10/15/2014    Priority: Medium  . Overactive bladder 10/15/2014    Priority: Medium  . Gout of big toe 08/04/2008    Priority: Medium  . Essential hypertension 07/25/2007    Priority: Medium  . PROSTATE CANCER, HX OF 07/25/2007    Priority: Medium  . Allergic rhinitis 10/15/2014    Priority: Low  . CARDIAC MURMUR 01/10/2011    Priority: Low  . ORGANIC IMPOTENCE 08/12/2010    Priority: Low  . Memory loss 05/08/2015    Medications- reviewed and updated Current Outpatient Prescriptions  Medication Sig Dispense Refill  . amLODipine-olmesartan (AZOR) 5-40 MG tablet Take 1 tablet by mouth daily. 30 tablet 5  . atorvastatin (LIPITOR) 40 MG tablet Take 1 tablet (40 mg total) by mouth daily. 30 tablet 5  . donepezil (ARICEPT) 5 MG tablet Take once a day 30 tablet 5   No current facility-administered medications for this visit.     Objective: BP (!) 142/72 (BP Location: Left Arm, Patient Position: Sitting, Cuff Size: Normal)   Pulse 66   Temp 98 F (36.7 C) (Oral)   Wt 194 lb 3.2 oz (88.1 kg)   SpO2 96%   BMI 30.88 kg/m  Gen: NAD, resting comfortably CV: RRR no murmurs rubs or gallops Lungs: CTAB no crackles, wheeze, rhonchi Abdomen: soft/nontender/nondistended/normal bowel sounds. No rebound or guarding.  Ext: no edema Skin: warm, dry Neuro: grossly normal, moves all extremities  Assessment/Plan:  Polyuria S:Polyuria daughter noted at her house. Recent treatment for UTI with augmentin A/P: will test urine today and consider another round of antibiotics if UTI  remains   Essential hypertension S: improved controll back on amlodipine 5-'40mg'$ . Lisinopril in past BP Readings from Last 3 Encounters:  08/22/16 (!) 142/72  08/08/16 (!) 170/80  08/04/16 (!) 164/80  A/P:Continue current meds:  We discussed tighter control goal but without CAD or CVA history- patient and family concerned about dizziness/falls with tighter control so we opted for <150/90 goal  Mild dementia S: restarted aricept and tolerating well.  A/P: we also placed referral for neurolgoy again as patient/family did not reach out to schedule follow up with Dr. Delice Lesch as previously planned.    Hyperlipidemia S: suspect improved control back on atorvastatin '40mg'$ . No myalgias.  Lab Results  Component Value Date   CHOL 203 (H) 08/01/2016   HDL 33.40 (L) 08/01/2016   LDLCALC 150 (H) 08/01/2016   TRIG 102.0 08/01/2016   CHOLHDL 6 08/01/2016   A/P: continue current medication as tolerating well and no signs at this point of SE. Monitor cbg with labs today fasting   Return in about 4 months (around 12/22/2016).  Orders Placed This Encounter  Procedures  . Urine culture    [facility]  . Flu vaccine HIGH DOSE PF  . Ambulatory referral to Neurology    Referral Priority:   Routine    Referral Type:   Consultation    Referral Reason:   Specialty Services Required    Requested Specialty:   Neurology    Number of Visits Requested:  1   Return precautions advised.  Garret Reddish, MD

## 2016-08-22 NOTE — Patient Instructions (Addendum)
We will tolerate blood pressure being between 140-150 given no history of heart attack or stroke to balance the fact that controlling more tightly could increase dizziness and risk of falls  Continue cholsterol medicine  Update urine test today  We will call you within a week about your referral to neurology. If you do not hear within 2 weeks, give [place] a call.

## 2016-08-22 NOTE — Assessment & Plan Note (Signed)
S: restarted aricept and tolerating well.  A/P: we also placed referral for neurolgoy again as patient/family did not reach out to schedule follow up with Dr. Delice Lesch as previously planned.

## 2016-08-23 LAB — URINE CULTURE: Organism ID, Bacteria: NO GROWTH

## 2016-09-26 ENCOUNTER — Encounter: Payer: Self-pay | Admitting: Neurology

## 2016-09-26 ENCOUNTER — Other Ambulatory Visit: Payer: Self-pay | Admitting: Neurology

## 2016-09-26 ENCOUNTER — Ambulatory Visit (INDEPENDENT_AMBULATORY_CARE_PROVIDER_SITE_OTHER): Payer: Medicare Other | Admitting: Neurology

## 2016-09-26 VITALS — BP 144/62 | HR 60 | Temp 98.2°F | Ht 66.5 in | Wt 194.3 lb

## 2016-09-26 DIAGNOSIS — F0390 Unspecified dementia without behavioral disturbance: Secondary | ICD-10-CM

## 2016-09-26 DIAGNOSIS — F03B0 Unspecified dementia, moderate, without behavioral disturbance, psychotic disturbance, mood disturbance, and anxiety: Secondary | ICD-10-CM

## 2016-09-26 MED ORDER — MEMANTINE HCL 10 MG PO TABS: ORAL_TABLET | ORAL | 5 refills | Status: DC

## 2016-09-26 NOTE — Patient Instructions (Signed)
1.Schedule further memory testing with Dr. Si Raider 2. Continue Donepezil 3. Start Namenda '10mg'$ : Take 1 tablet daily for 1 week, then increase to 1 tablet twice a day 4. Follow-up in 3 months

## 2016-09-26 NOTE — Progress Notes (Signed)
NEUROLOGY FOLLOW UP OFFICE NOTE  Jerry [name] [id number]  HISTORY OF PRESENT ILLNESS: I had the pleasure of seeing Jerry [name] in follow-up in the neurology clinic on 09/26/2016.  The patient was last seen more than a year ago for dementia, MMSE in July 2016 was 20/30. He was given a prescription for Donepezil, it appears he did not take it but was restarted by his PCP. He is accompanied by his other [name] today. I spoke separately to Jerry [name], then to the patient afterward. His daughter reports that he is a little uptight because his other [name], his POA who lives in [place], has been handling his finances but he feels like he is totally cut off. He became upset that she should sign off on his transactions and went to the bank and blocked Jerry [name] from his account. She had been paying his bills but he wanted to handle his own finances. Apparently Jerry [name] spoke to Jerry [name] last night and she will be helping him now. She is now helping with his medications and puts them in a pillbox every week, reporting that he takes his medications regularly. His car broke down so he has not been driving recently. On his last visit, DMV forms were filled out about his restrictions for commercial driving, it is unclear what the Medical Advisory Board decision was, although the patient reports he has given up the long-distance driving. Jerry [name] reports that he lives alone and seems to be okay, his house is clean, sometimes he does not wash the dishes. He occasionally would wear the same clothes after bathing, but states that he is only in the house. She is not sure if he regularly bathes, once in a while she smells a stench and reminds him to bathe. She occasionally washes his clothes for him. She and another sister Jerry [name] take turns with bringing him food. There are family issues here, she reports that she does not talk to her sister a lot but last night spoke to her about his account. The patient reports  that he thinks his memory is fine. He is upset that he needs to call his daughter when he needs money or that she has to sign off on his decisions. He reports going to the bank and "not getting the answers I wanted," so he put himself back over his accounts and "took Jerry [name] off overlooking them."   He denies any headaches, dizziness, diplopia, dysarthria, dysphagia, neck/back pain, focal numbness/tingling/weakness, bowel/bladder dysfunction. No falls.   HPI: This is a 80 yo RH man with a history of hypertension, hyperlipidemia, prostate cancer, who presented for evaluation of memory loss. 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 He reports that debt gets heavy due to maintenance issues, and that his daughter visiting him is helping him recently. His daughter unfortunately could not provide any information today for me, she gave me a small note stating that she could not say anything in front of him because she is the only one he trusts. She did write on the note that he gets lost driving short distances, and repeats himself or asks the same questions within 5 minutes. She expressed that she is afraid for his driving and that he gets angry. The patient himself denies getting lost driving but does agree that he gets turned around sometimes. He uses a GPS mostly. He denies any forgotten bills, but states he  has some missed bills because of lack of funds rather than forgetting. He brings his medications and states he only takes the Azor and omega 3 on a daily basis, but occasionally forgets them when he is busy. He is also listed to be taking Lipitor and Vesicare, but he denies taking them. He denies any word-finding difficulties, he denies misplacing things. He does note some difficulty multitasking. He denies any family history of dementia, no history of head injuries. He had stopped alcohol use  many years ago.   Diagnostic Data: I personally reviewed MRI brain which did not show any acute changes, there is mild diffuse atrophy and chronic microvascular disease. TSH and B12 normal.   PAST MEDICAL HISTORY: Past Medical History:  Diagnosis Date  . Cancer of prostate ([place])   . Diverticulosis   . DIVERTICULOSIS, COLON 07/25/2007   Qualifier: Diagnosis of  By: [name], LPN, [name]   . Hypertension     MEDICATIONS: Current Outpatient Prescriptions on File Prior to Visit  Medication Sig Dispense Refill  . amLODipine-olmesartan (AZOR) 5-40 MG tablet Take 1 tablet by mouth daily. 30 tablet 5  . atorvastatin (LIPITOR) 40 MG tablet Take 1 tablet (40 mg total) by mouth daily. 30 tablet 5  . donepezil (ARICEPT) 5 MG tablet Take once a day 30 tablet 5   No current facility-administered medications on file prior to visit.     ALLERGIES: No Known Allergies  FAMILY HISTORY: Family History  Problem Relation Age of Onset  . Cirrhosis    . Early death Father     cirrhosis  . Breast cancer Sister   . Alcoholism Father     SOCIAL HISTORY: Social History   Social History  . Marital status: Divorced    Spouse name: N/A  . Number of children: N/A  . Years of education: N/A   Occupational History  . Not on file.   Social History Main Topics  . Smoking status: Former Smoker    Quit date: 12/26/1988  . Smokeless tobacco: Never Used  . Alcohol use No  . Drug use: No  . Sexual activity: Not Currently   Other Topics Concern  . Not on file   Social History Narrative   Separated since 2010. 4 kids. 4 grandkids. 2 greatgrandkids.       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 Dementia unfortunately stopped these travels.     REVIEW OF SYSTEMS: Constitutional: No fevers, chills, or sweats, no generalized fatigue, change in appetite Eyes: No visual changes, double vision, eye pain Ear, nose and throat: No hearing loss, ear pain,  nasal congestion, sore throat Cardiovascular: No chest pain, palpitations Respiratory:  No shortness of breath at rest or with exertion, wheezes GastrointestinaI: No nausea, vomiting, diarrhea, abdominal pain, fecal incontinence Genitourinary:  No dysuria, urinary retention or frequency Musculoskeletal:  No neck pain, back pain Integumentary: No rash, pruritus, skin lesions Neurological: as above Psychiatric: No depression, insomnia, anxiety Endocrine: No palpitations, fatigue, diaphoresis, mood swings, change in appetite, change in weight, increased thirst Hematologic/Lymphatic:  No anemia, purpura, petechiae. Allergic/Immunologic: no itchy/runny eyes, nasal congestion, recent allergic reactions, rashes  PHYSICAL EXAM: Vitals:   09/26/16 1443  BP: (!) 144/62  Pulse: 60  Temp: 98.2 F (36.8 C)   General: No acute distress Head:  Normocephalic/atraumatic Skin/Extremities: No rash, no edema Neurological Exam: alert and oriented to person, place, did not know month/year. No aphasia or dysarthria. Fund of knowledge is  appropriate.  Recent and remote memory are impaired.  Attention and concentration are normal, could not spell Jerry [name] backwards.   Able to name objects and repeat phrases.  MMSE - Mini Mental State Exam 09/26/2016 07/02/2015  Orientation to time 2 2  Orientation to Place 4 5  Registration 3 3  Attention/ Calculation 0 1  Recall 0 0  Language- name 2 objects 2 2  Language- repeat 1 1  Language- follow 3 step command 2 3  Language- read & follow direction 1 1  Write a sentence 1 1  Copy design 1 1  Total score 17 20    Cranial nerves: Pupils equal, round. Extraocular movements intact with no nystagmus. No facial asymmetry. Motor: Bulk and tone normal, muscle strength 5/5 throughout. Gait narrow-based and steady.  IMPRESSION: This is a 80 yo RH man with a history of hypertension, hyperlipidemia, prostate cancer, with worsening memory. MMSE today 17/30, indicating moderate  dementia (previously 20/30 in July 2016). Findings were discussed with the patient but he has minimal insight into his condition. Findings were discussed separately with his [name], and she expressed understanding. He will add on Namenda to Aricept, side effects were discussed. There are family issues that are causing problems with them, his [name] living in [place] is his POA, but he recently got upset when he found out she was "overlooking" his bank accounts and had to sign off on his transactions. He went to the bank and took her name off. It is unclear if he can manage his finances effectively, he is adamant he wants to make all the decisions with regards to his money. We discussed the need to plan ahead and appoint someone to help him in the future, but he does not seem to understand this. There seem to be issues with his daughters as well, at the end of the visit, Jerry [name] thought her sister Jerry [name] had called prior/during this visit and was upset. I told her I have not spoken to [facility]. He will be scheduled for Neuropsychological evaluation, however I suspect he would need a capacity evaluation later on. I discussed with Jerry [name] that family will eventually have to decide among themselves who would be helping their father later on. He will follow-up in 3 months and knows to call for any changes.   Thank you for allowing me to participate in his care.  Please do not hesitate to call for any questions or concerns.  The duration of this appointment visit was 24 minutes of face-to-face time with the patient.  Greater than 50% of this time was spent in counseling, explanation of diagnosis, planning of further management, and coordination of care.   Ellouise Newer, M.D.   CC: Dr. Yong Channel

## 2016-09-27 NOTE — Telephone Encounter (Signed)
Rx sent to pharmacy for 90 day supply.  

## 2016-10-20 ENCOUNTER — Encounter (HOSPITAL_COMMUNITY): Payer: Self-pay | Admitting: Emergency Medicine

## 2016-10-20 ENCOUNTER — Ambulatory Visit (HOSPITAL_COMMUNITY)
Admission: EM | Admit: 2016-10-20 | Discharge: 2016-10-20 | Disposition: A | Discharge status: Home / Self Care | Payer: Medicare Other | Attending: Family Medicine | Admitting: Family Medicine

## 2016-10-20 DIAGNOSIS — W19XXXA Unspecified fall, initial encounter: Secondary | ICD-10-CM

## 2016-10-20 DIAGNOSIS — T887XXA Unspecified adverse effect of drug or medicament, initial encounter: Secondary | ICD-10-CM | POA: Diagnosis not present

## 2016-10-20 DIAGNOSIS — R42 Dizziness and giddiness: Secondary | ICD-10-CM

## 2016-10-20 DIAGNOSIS — T50905A Adverse effect of unspecified drugs, medicaments and biological substances, initial encounter: Secondary | ICD-10-CM

## 2016-10-20 NOTE — ED Triage Notes (Signed)
PT fell this morning at 8am. PT states he stumbled over the curb at the drugstore because he was trying to look in the window while he was walking. PT has an abrasion to left knee and right fifth finger. PT states, "I want a BP check to make sure it's not high or low." PT also started Namenda a month ago and reports he believes it has made him dizzy. No falls prior to this episode.

## 2016-10-20 NOTE — ED Provider Notes (Signed)
[facility]    CSN: [id number] Arrival date & time: 10/20/16  1600   History   Chief Complaint Chief Complaint  Patient presents with  . Fall    HPI Jerry [name] is a 80 y.o. male brought to urgent care by his daughter for evaluation of a fall.   He had walked about 1 mile to [facility] about 8 hours PTA when he tripped over a curb while not looking where he was going, stumbling forward and landing on knees and hands. No head trauma or LOC. No antecedent symptoms, palpitations, dizziness, light-headedness, dyspnea, weakness. He denies history of falling.   Of major concern to his daughter is that he has reported daily dizziness for the past 2 weeks, a new complaint for him. She notes he started namenda earlier this month and believes this is the causative factor. He denies true vertigo, just "heavy headedness" intermittently, sometimes when sitting from laying/standing from sitting, but other times at rest or when ambulating.   HPI  Past Medical History:  Diagnosis Date  . Cancer of prostate ([place])   . Diverticulosis   . DIVERTICULOSIS, COLON 07/25/2007   Qualifier: Diagnosis of  By: [name], LPN, [name]   . Hypertension     Patient Active Problem List   Diagnosis Date Noted  . Moderate dementia without behavioral disturbance 07/17/2015  . Memory loss 05/08/2015  . Hyperlipidemia 10/15/2014  . Allergic rhinitis 10/15/2014  . Overactive bladder 10/15/2014  . CARDIAC MURMUR 01/10/2011  . ORGANIC IMPOTENCE 08/12/2010  . Gout of big toe 08/04/2008  . Essential hypertension 07/25/2007  . PROSTATE CANCER, HX OF 07/25/2007   Past Surgical History:  Procedure Laterality Date  . penile pump implant     patient denies  . PROSTATECTOMY     seed implant    Home Medications    Prior to Admission medications   Medication Sig Start Date End Date Taking? Authorizing Provider  amLODipine-olmesartan (AZOR) 5-40 MG tablet Take 1 tablet by mouth daily. 08/08/16    Marin Olp, MD  atorvastatin (LIPITOR) 40 MG tablet Take 1 tablet (40 mg total) by mouth daily. 08/08/16 08/08/17  Marin Olp, MD  donepezil (ARICEPT) 5 MG tablet Take once a day 08/08/16   Marin Olp, MD  memantine (NAMENDA) 10 MG tablet TAKE 1 TABLET BY MOUTH DAILY FOR 1 WEEK,THEN INCREASE TO 1 TABLET TWICE DAILY AND CONTINUE 09/27/16   Cameron Sprang, MD    Family History Family History  Problem Relation Age of Onset  . Early death Father     cirrhosis  . Alcoholism Father   . Cirrhosis    . Breast cancer Sister     Social History Social History  Substance Use Topics  . Smoking status: Former Smoker    Quit date: 12/26/1988  . Smokeless tobacco: Never Used  . Alcohol use No     Allergies   Review of patient's allergies indicates no known allergies.   Review of Systems Review of Systems As above  Physical Exam Triage Vital Signs ED Triage Vitals  Enc Vitals Group     BP 10/20/16 1616 125/69     Pulse Rate 10/20/16 1616 80     Resp 10/20/16 1616 12     Temp 10/20/16 1616 98.6 F (37 C)     Temp Source 10/20/16 1616 Oral     SpO2 10/20/16 1616 100 %     Weight 10/20/16 1617 199 lb (90.3 kg)  Height 10/20/16 1617 '5\' 8"'$  (1.727 m)     Head Circumference --      Peak Flow --      Pain Score 10/20/16 1623 0     Pain Loc --      Pain Edu? --      Excl. in Hall? --    No data found.   Updated Vital Signs BP 125/69 (BP Location: Left Arm) Comment: Simultaneous filing. User may not have seen previous data. Comment (BP Location): Simultaneous filing. User may not have seen previous data.  Pulse 80 Comment: Simultaneous filing. User may not have seen previous data.  Temp 98.6 F (37 C) (Oral) Comment: Simultaneous filing. User may not have seen previous data. Comment (Src): Simultaneous filing. User may not have seen previous data.  Resp 12 Comment: Simultaneous filing. User may not have seen previous data.  Ht '5\' 8"'$  (1.727 m)   Wt 199 lb (90.3 kg)    SpO2 100% Comment: Simultaneous filing. User may not have seen previous data.  BMI 30.26 kg/m   Physical Exam  Constitutional: He is oriented to person, place, and time. He appears well-developed and well-nourished. No distress.  HENT:  Head: Normocephalic and atraumatic.  Eyes: EOM are normal. Pupils are equal, round, and reactive to light. No scleral icterus.  Neck: Normal range of motion. Neck supple. No JVD present.  Cardiovascular: Normal rate, regular rhythm and intact distal pulses.   Murmur (II/VI early blowing systolic murmur most at RUSB) heard. Pulmonary/Chest: Effort normal and breath sounds normal. No respiratory distress. He has no wheezes. He has no rales.  Abdominal: Soft. Bowel sounds are normal. He exhibits no distension. There is no tenderness.  Musculoskeletal: Normal range of motion. He exhibits no edema or tenderness.  Lymphadenopathy:    He has no cervical adenopathy.  Neurological: He is alert and oriented to person, place, and time. He has normal reflexes. He exhibits normal muscle tone.  Skin: Skin is warm and dry. Capillary refill takes less than 2 seconds.  very mild abrasion on bilateral knees without bleeding.   Vitals reviewed.    UC Treatments / Results  Labs (all labs ordered are listed, but only abnormal results are displayed) Labs Reviewed - No data to display  EKG  EKG Interpretation None      Radiology No results found.  Procedures Procedures (including critical care time)  Medications Ordered in UC Medications - No data to display  Initial Impression / Assessment and Plan / UC Course  I have reviewed the triage vital signs and the nursing notes.  Pertinent labs & imaging results that were available during my care of the patient were reviewed by me and considered in my medical decision making (see chart for details).  Final Clinical Impressions(s) / UC Diagnoses   Final diagnoses:  Fall, initial encounter  Medication side  effect, initial encounter   Pleasant, elderly gentleman presenting after a fall.   Fall sounds largely mechanical with little concern for acute cardiac, metabolic, or neurological cause. No further evaluation is necessary at this time.   His complaint of "heavy headedness" that began shortly after starting namenda seems to be the family's primary concern. They have been taking this only once per day (though they were instructed to increase to BID after 1 week). Outpatient BP checks are reportedly normal. 125/69 here. Namenda is associated with dizziness (reported incidence is 5% to 7%) and due to his high risk of falls, I have asked them to  discontinue this until they follow up with Dr. Delice Lesch on 10/30.   Patrecia Pour, MD 10/20/16 [phone]

## 2016-10-20 NOTE — Discharge Instructions (Signed)
Please stop taking namenda until you follow up with Dr. Delice Lesch

## 2016-10-24 ENCOUNTER — Ambulatory Visit (INDEPENDENT_AMBULATORY_CARE_PROVIDER_SITE_OTHER): Payer: Medicare Other | Admitting: Psychology

## 2016-10-24 ENCOUNTER — Encounter: Payer: Self-pay | Admitting: Psychology

## 2016-10-24 ENCOUNTER — Telehealth: Payer: Self-pay | Admitting: Neurology

## 2016-10-24 DIAGNOSIS — R413 Other amnesia: Secondary | ICD-10-CM

## 2016-10-24 DIAGNOSIS — F0390 Unspecified dementia without behavioral disturbance: Secondary | ICD-10-CM | POA: Diagnosis not present

## 2016-10-24 DIAGNOSIS — F03B0 Unspecified dementia, moderate, without behavioral disturbance, psychotic disturbance, mood disturbance, and anxiety: Secondary | ICD-10-CM

## 2016-10-24 NOTE — Telephone Encounter (Signed)
LVM for patients daughter of below.

## 2016-10-24 NOTE — Telephone Encounter (Signed)
That is fine, hold off on medication. Thanks

## 2016-10-24 NOTE — Telephone Encounter (Signed)
Jerry [name] 06/19/1936. He is here today to se Dr. Si Raider and while here his daughter wanted to make you aware that the Memantine 10 MG medication, he believes this one is making him dizzy and he has discontinued it.  His daughter's # is [phone]. He uses Public librarian on [address] [phone]. Thank you

## 2016-10-24 NOTE — Progress Notes (Signed)
NEUROPSYCHOLOGICAL INTERVIEW (CPT: [id number])  Name: Jerry [name] Date of Birth: 10/28/1936 Date of Interview: 10/24/2016  Reason for Referral:  Jerry [name] is a 80 y.o., right-handed male who is referred for neuropsychological evaluation by Dr. Ellouise Newer of [facility] due to concerns about progressive memory loss. This patient was accompanied to the office by his daughter, but the patient did not want her to accompany him for his interview appointment. As such, he was interviewed unaccompanied, and no collateral report is available.  History of Presenting Problem:  Jerry [name] was last seen by Dr. Delice Lesch on 09/26/2016. He scored 17/30 on the MMSE (reduced from 06/2015 when it was 20/30). He is diagnosed with moderate dementia. He had been prescribed Aricept, and Namenda was added at his 10/2 visit. However, he had some dizziness with this medication, and he sustained a fall on 10/20/2016. He was seen in the ED but did not require inpatient admission. It was recommended that he hold the Namenda for now since it could be causing dizziness.   At today's appointment, the patient denies any concerns about his memory or cognitive functioning. He is a poor historian, and he did not permit any collateral reporting. I see from Dr. Amparo Bristol notes that the patient's daughter, [name], is his POA and has significant concerns about his cognitive functioning.   Upon direct questioning, the patient denied problems with forgetting recent conversations/events, repeating statements/questions, misplacing/losing items, forgetting appointments or other obligations, forgetting to take medications, difficulty concentrating, word-finding difficulty or difficulty with directions and navigating.   Jerry [name] lives alone. He is no longer driving, as his car is broken down. He reported that he independently manages his medications, using a 7-day planner. He reported that his daughter manages his finances/bills  and appointments. He initially reported that he does all his cooking but later stated that he does not do much cooking anymore.   The patient denied psychiatric history. 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   He noted recent stressors, in the last year, including the death of a sister and the death of his ex-wife. He denied depressed mood or anxiety. He described his current mood as "fine". He initially denied any sleep difficulty but later reported that he sometimes has difficulty with sleep maintenance. When asked about appetite, he reported that he eats less than he used to. He denied hallucinations. No imminent risk of self-harm was identified.  Jerry [name] reported that he sees his daughter [name] almost every day. He also has a daughter, Cristopher Estimable, in [place], and it seems there has been some tension regarding her involvement in the patient's care (the patient reports she was helping with his bills/finances but he has asked her to stop?).    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  Marital history: Was married one time- separated/divorced, she is now deceased. 3 daughters - Luiz Blare (lives in [place]); 1 son-Arnold (has a "mental problem" and lives in an institution?).  Alcohol/Tobacco/Substances: Denies any current use  Medical History: Past Medical History:  Diagnosis Date  . Cancer of prostate ([place])   . Diverticulosis   . DIVERTICULOSIS, COLON 07/25/2007   Qualifier: Diagnosis of  By:  [name], LPN, [name]   . Hypertension       Current Medications:  Outpatient Encounter Prescriptions as of 10/24/2016  Medication Sig  . amLODipine-olmesartan (AZOR) 5-40 MG tablet Take 1 tablet by mouth daily.  Marland Kitchen  atorvastatin (LIPITOR) 40 MG tablet Take 1 tablet (40 mg total) by mouth daily.  Marland Kitchen donepezil (ARICEPT) 5 MG tablet Take once a day  . memantine (NAMENDA) 10 MG tablet TAKE 1 TABLET BY MOUTH DAILY FOR 1 WEEK,THEN INCREASE TO 1 TABLET TWICE DAILY AND CONTINUE   No facility-administered encounter medications on file as of 10/24/2016.      Behavioral Observations:   Appearance: Neatly and appropriately dressed and groomed. Excessive tearing of left eye is observed. Gait: Ambulated independently, no gross abnormalities observed Speech: Fluent; normal rate, rhythm and volume. Repeats himself frequently throughout the interview. Thought process: Tangential Affect: Full, jovial, frequently joking Interpersonal: Pleasant, engaged, mildly disinhibited    TESTING: There is medical necessity to proceed with neuropsychological assessment as the results will be used to aid in differential diagnosis and clinical decision-making and to inform specific treatment recommendations. Per medical records reviewed, there has been a progressive decline in cognitive functioning and a reasonable suspicion of dementia due to Alzheimer's disease.   PLAN: The patient will return for a full battery of neuropsychological testing with a psychometrician under my supervision. Education regarding testing procedures was provided. Subsequently, the patient will see this provider for a follow-up session at which time his test performances and my impressions and treatment recommendations will be reviewed in detail.   Full neuropsychological evaluation report to follow.

## 2016-10-24 NOTE — Telephone Encounter (Signed)
Please advise 

## 2016-11-14 ENCOUNTER — Encounter: Payer: Medicare Other | Admitting: Psychology

## 2016-12-05 DIAGNOSIS — H04123 Dry eye syndrome of bilateral lacrimal glands: Secondary | ICD-10-CM | POA: Diagnosis not present

## 2016-12-05 DIAGNOSIS — H40013 Open angle with borderline findings, low risk, bilateral: Secondary | ICD-10-CM | POA: Diagnosis not present

## 2016-12-05 DIAGNOSIS — H43813 Vitreous degeneration, bilateral: Secondary | ICD-10-CM | POA: Diagnosis not present

## 2016-12-05 DIAGNOSIS — Z961 Presence of intraocular lens: Secondary | ICD-10-CM | POA: Diagnosis not present

## 2017-01-03 ENCOUNTER — Ambulatory Visit: Payer: Medicare Other | Admitting: Neurology

## 2017-01-09 ENCOUNTER — Encounter: Payer: Self-pay | Admitting: Neurology

## 2017-02-07 ENCOUNTER — Encounter (HOSPITAL_COMMUNITY): Payer: Self-pay

## 2017-02-07 ENCOUNTER — Emergency Department (HOSPITAL_COMMUNITY): Payer: Medicare Other

## 2017-02-07 ENCOUNTER — Ambulatory Visit: Payer: Medicare Other | Admitting: Internal Medicine

## 2017-02-07 ENCOUNTER — Emergency Department (HOSPITAL_COMMUNITY)
Admission: EM | Admit: 2017-02-07 | Discharge: 2017-02-07 | Disposition: A | Discharge status: Home / Self Care | Payer: Medicare Other | Source: Home / Self Care | Attending: Emergency Medicine | Admitting: Emergency Medicine

## 2017-02-07 DIAGNOSIS — E7800 Pure hypercholesterolemia, unspecified: Secondary | ICD-10-CM | POA: Diagnosis not present

## 2017-02-07 DIAGNOSIS — Z87891 Personal history of nicotine dependence: Secondary | ICD-10-CM

## 2017-02-07 DIAGNOSIS — K122 Cellulitis and abscess of mouth: Secondary | ICD-10-CM | POA: Diagnosis not present

## 2017-02-07 DIAGNOSIS — I129 Hypertensive chronic kidney disease with stage 1 through stage 4 chronic kidney disease, or unspecified chronic kidney disease: Secondary | ICD-10-CM | POA: Diagnosis not present

## 2017-02-07 DIAGNOSIS — J029 Acute pharyngitis, unspecified: Secondary | ICD-10-CM | POA: Diagnosis not present

## 2017-02-07 DIAGNOSIS — K5730 Diverticulosis of large intestine without perforation or abscess without bleeding: Secondary | ICD-10-CM | POA: Diagnosis not present

## 2017-02-07 DIAGNOSIS — J384 Edema of larynx: Secondary | ICD-10-CM | POA: Diagnosis not present

## 2017-02-07 DIAGNOSIS — R911 Solitary pulmonary nodule: Secondary | ICD-10-CM | POA: Diagnosis not present

## 2017-02-07 DIAGNOSIS — R220 Localized swelling, mass and lump, head: Secondary | ICD-10-CM | POA: Diagnosis not present

## 2017-02-07 DIAGNOSIS — E785 Hyperlipidemia, unspecified: Secondary | ICD-10-CM | POA: Diagnosis not present

## 2017-02-07 DIAGNOSIS — Z79899 Other long term (current) drug therapy: Secondary | ICD-10-CM

## 2017-02-07 DIAGNOSIS — R221 Localized swelling, mass and lump, neck: Secondary | ICD-10-CM | POA: Diagnosis not present

## 2017-02-07 DIAGNOSIS — I10 Essential (primary) hypertension: Secondary | ICD-10-CM

## 2017-02-07 DIAGNOSIS — Z8546 Personal history of malignant neoplasm of prostate: Secondary | ICD-10-CM | POA: Insufficient documentation

## 2017-02-07 DIAGNOSIS — N183 Chronic kidney disease, stage 3 (moderate): Secondary | ICD-10-CM | POA: Diagnosis not present

## 2017-02-07 DIAGNOSIS — K1120 Sialoadenitis, unspecified: Secondary | ICD-10-CM

## 2017-02-07 LAB — I-STAT CHEM 8, ED
BUN: 13 mg/dL (ref 6–20)
Calcium, Ion: 1.16 mmol/L (ref 1.15–1.40)
Chloride: 106 mmol/L (ref 101–111)
Creatinine, Ser: 1.3 mg/dL — ABNORMAL HIGH (ref 0.61–1.24)
GLUCOSE: 80 mg/dL (ref 65–99)
HEMATOCRIT: 43 % (ref 39.0–52.0)
HEMOGLOBIN: 14.6 g/dL (ref 13.0–17.0)
POTASSIUM: 4.1 mmol/L (ref 3.5–5.1)
Sodium: 144 mmol/L (ref 135–145)
TCO2: 26 mmol/L (ref 0–100)

## 2017-02-07 MED ORDER — SODIUM CHLORIDE 0.9 % IV BOLUS (SEPSIS)
1000.0000 mL | Freq: Once | INTRAVENOUS | Status: AC
  Administered 2017-02-07: 1000 mL via INTRAVENOUS

## 2017-02-07 MED ORDER — IOPAMIDOL (ISOVUE-300) INJECTION 61%
INTRAVENOUS | Status: AC
  Administered 2017-02-07: 75 mL
  Filled 2017-02-07: qty 75

## 2017-02-07 MED ORDER — AMOXICILLIN-POT CLAVULANATE 875-125 MG PO TABS: 1.0000 | ORAL_TABLET | Freq: Two times a day (BID) | ORAL | 0 refills | Status: DC

## 2017-02-07 NOTE — ED Triage Notes (Signed)
Patient here with right sided facial swelling since am, no difficulty swallowing, unsure if related to broken teeth

## 2017-02-07 NOTE — ED Notes (Signed)
Patient transported to CT 

## 2017-02-07 NOTE — Discharge Instructions (Signed)
Suck on hard candies. Warm compresses 4 times a day to the affected area. Take the antibiotics as prescribed. Return for difficulty swallowing sudden worsening spreading of redness fever.

## 2017-02-07 NOTE — ED Provider Notes (Signed)
[facility] DEPT Provider Note   CSN: [id number] Arrival date & time: 02/07/17  1033     History   Chief Complaint Chief Complaint  Patient presents with  . right sided facial swelling    HPI Jerry [name] is a 81 y.o. male.  81 yo M with a chief complaint of right-sided neck swelling. The started yesterday and worsened throughout the night. Patient states that it hurt when he pushed on it or turn his neck. Denies issues with swallowing or breathing. Has never had anything like this before. Denies any intraoral pain. Denies fevers or chills. Denies vomiting. Denies injury to the area.   The history is provided by the patient.  Illness  This is a new problem. The current episode started yesterday. The problem occurs constantly. Progression since onset: Initially worsened and then improved. Pertinent negatives include no chest pain, no abdominal pain, no headaches and no shortness of breath. Nothing aggravates the symptoms. Nothing relieves the symptoms. He has tried nothing for the symptoms. The treatment provided no relief.    Past Medical History:  Diagnosis Date  . Cancer of prostate ([place])   . Diverticulosis   . DIVERTICULOSIS, COLON 07/25/2007   Qualifier: Diagnosis of  By: [name], LPN, [name]   . Hypertension     Patient Active Problem List   Diagnosis Date Noted  . Moderate dementia without behavioral disturbance 07/17/2015  . Memory loss 05/08/2015  . Hyperlipidemia 10/15/2014  . Allergic rhinitis 10/15/2014  . Overactive bladder 10/15/2014  . CARDIAC MURMUR 01/10/2011  . ORGANIC IMPOTENCE 08/12/2010  . Gout of big toe 08/04/2008  . Essential hypertension 07/25/2007  . PROSTATE CANCER, HX OF 07/25/2007    Past Surgical History:  Procedure Laterality Date  . penile pump implant     patient denies  . PROSTATECTOMY     seed implant       Home Medications    Prior to Admission medications   Medication Sig Start Date End Date Taking?  Authorizing Provider  amLODipine-olmesartan (AZOR) 5-40 MG tablet Take 1 tablet by mouth daily. 08/08/16   Marin Olp, MD  amoxicillin-clavulanate (AUGMENTIN) 875-125 MG tablet Take 1 tablet by mouth 2 (two) times daily. 02/07/17 02/17/17  Deno Etienne, DO  atorvastatin (LIPITOR) 40 MG tablet Take 1 tablet (40 mg total) by mouth daily. 08/08/16 08/08/17  Marin Olp, MD  donepezil (ARICEPT) 5 MG tablet Take once a day 08/08/16   Marin Olp, MD  memantine (NAMENDA) 10 MG tablet TAKE 1 TABLET BY MOUTH DAILY FOR 1 WEEK,THEN INCREASE TO 1 TABLET TWICE DAILY AND CONTINUE 09/27/16   Cameron Sprang, MD    Family History Family History  Problem Relation Age of Onset  . Early death Father     cirrhosis  . Alcoholism Father   . Cirrhosis    . Breast cancer Sister     Social History Social History  Substance Use Topics  . Smoking status: Former Smoker    Quit date: 12/26/1988  . Smokeless tobacco: Never Used  . Alcohol use No     Allergies   Patient has no known allergies.   Review of Systems Review of Systems  Constitutional: Negative for chills and fever.  HENT: Negative for congestion, facial swelling, sore throat, trouble swallowing and voice change.        Neck swelling  Eyes: Negative for discharge and visual disturbance.  Respiratory: Negative for shortness of breath.   Cardiovascular: Negative for chest pain  and palpitations.  Gastrointestinal: Negative for abdominal pain, diarrhea and vomiting.  Musculoskeletal: Negative for arthralgias and myalgias.  Skin: Negative for color change and rash.  Neurological: Negative for tremors, syncope and headaches.  Psychiatric/Behavioral: Negative for confusion and dysphoric mood.     Physical Exam Updated Vital Signs BP 150/71   Pulse 80   Temp 98.1 F (36.7 C) (Oral)   Resp 18   SpO2 100%   Physical Exam  Constitutional: He is oriented to person, place, and time. He appears well-developed and well-nourished.  HENT:    Head: Normocephalic and atraumatic.    Eyes: EOM are normal. Pupils are equal, round, and reactive to light.  Neck: Normal range of motion. Neck supple. No JVD present.  Cardiovascular: Normal rate and regular rhythm.  Exam reveals no gallop and no friction rub.   No murmur heard. Pulmonary/Chest: No respiratory distress. He has no wheezes.  Abdominal: He exhibits no distension. There is no rebound and no guarding.  Musculoskeletal: Normal range of motion.  Neurological: He is alert and oriented to person, place, and time.  Skin: No rash noted. No pallor.  Psychiatric: He has a normal mood and affect. His behavior is normal.  Nursing note and vitals reviewed.    ED Treatments / Results  Labs (all labs ordered are listed, but only abnormal results are displayed) Labs Reviewed  I-STAT CHEM 8, ED - Abnormal; Notable for the following:       Result Value   Creatinine, Ser 1.30 (*)    All other components within normal limits    EKG  EKG Interpretation None       Radiology Ct Soft Tissue Neck W Contrast  Result Date: 02/07/2017 CLINICAL DATA:  Right-sided facial swelling below jaw. EXAM: CT NECK WITH CONTRAST TECHNIQUE: Multidetector CT imaging of the neck was performed using the standard protocol following the bolus administration of intravenous contrast. CONTRAST:  53m ISOVUE-300 IOPAMIDOL (ISOVUE-300) INJECTION 61% COMPARISON:  None. FINDINGS: Pharynx and larynx: Mild edema in the right parapharyngeal space related to parotid swelling. Airway slightly displaced to the left. No airway compromise. No pharyngeal mass lesion. Salivary glands: Marked enlargement of the right parotid gland which shows diffuse edema. There is also extensive edema in the soft tissues around the right parotid gland extending into the subcutaneous fat and skin region. No abscess. No parotid mass or stone. Mass extends into the right parapharyngeal space. Edema also extends towards the right submandibular  gland which also appears slightly enlarged. Left submandibular and parotid glands normal. Thyroid: Negative Lymph nodes: No pathologic lymph nodes. Vascular: Right jugular vein is compressed due to edema in the parotid gland and adjacent soft tissues. There is no intraluminal thrombus. There is carotid artery calcification bilaterally. Limited intracranial: Negative Visualized orbits: Negative Mastoids and visualized paranasal sinuses: Negative Skeleton: Cervical degenerative change. No dental abscess. No acute skeletal abnormality. Upper chest: Right upper lobe spiculated nodule measuring 12 mm, suspicious for neoplasm. This is incompletely evaluated on the current study. Other: None IMPRESSION: Right parotid gland enlargement and surrounding edema most compatible with acute infection. No abscess or mass. Edema extends into the right parapharyngeal space and around the right submandibular gland. No airway compromise. The right jugular vein is compressed and may be occluded proximally but there is no thrombus present. Right upper lobe spiculated mass 12 mm. This is concerning for lung carcinoma. CT chest with contrast suggest for further evaluation. **An incidental finding of potential clinical significance has been found. Right upper  lobe mass** Electronically Signed   By: Franchot Gallo M.D.   On: 02/07/2017 15:14    Procedures Procedures (including critical care time)  Medications Ordered in ED Medications  sodium chloride 0.9 % bolus 1,000 mL (0 mLs Intravenous Stopped 02/07/17 1604)  iopamidol (ISOVUE-300) 61 % injection (75 mLs  Contrast Given 02/07/17 1442)     Initial Impression / Assessment and Plan / ED Course  I have reviewed the triage vital signs and the nursing notes.  Pertinent labs & imaging results that were available during my care of the patient were reviewed by me and considered in my medical decision making (see chart for details).     81 yo M With a chief complaint of  right-sided neck swelling. He is well-appearing and nontoxic. Is able to tolerate by mouth without difficulty. CT of the soft tissue neck concerning for infection of the parotid gland. I discussed this with Dr. Wilburn Cornelia, ENT. Recommended outpatient follow-up warm compresses and sialagogues.  4:53 PM:  I have discussed the diagnosis/risks/treatment options with the patient and family and believe the pt to be eligible for discharge home to follow-up with PCP, ENT. We also discussed returning to the ED immediately if new or worsening sx occur. We discussed the sx which are most concerning (e.g., sudden worsening pain, fever, inability to tolerate by mouth) that necessitate immediate return. Medications administered to the patient during their visit and any new prescriptions provided to the patient are listed below.  Medications given during this visit Medications  sodium chloride 0.9 % bolus 1,000 mL (0 mLs Intravenous Stopped 02/07/17 1604)  iopamidol (ISOVUE-300) 61 % injection (75 mLs  Contrast Given 02/07/17 1442)     The patient appears reasonably screen and/or stabilized for discharge and I doubt any other medical condition or other [facility] requiring further screening, evaluation, or treatment in the ED at this time prior to discharge.    Final Clinical Impressions(s) / ED Diagnoses   Final diagnoses:  Parotiditis    New Prescriptions Discharge Medication List as of 02/07/2017  3:56 PM    START taking these medications   Details  amoxicillin-clavulanate (AUGMENTIN) 875-125 MG tablet Take 1 tablet by mouth 2 (two) times daily., Starting Tue 02/07/2017, Until Fri 02/17/2017, [place], DO 02/07/17 1653

## 2017-02-08 ENCOUNTER — Inpatient Hospital Stay (HOSPITAL_COMMUNITY)
Admission: EM | Admit: 2017-02-08 | Discharge: 2017-02-11 | DRG: 153 | Disposition: A | Discharge status: Home / Self Care | Payer: Medicare Other | Attending: Internal Medicine | Admitting: Internal Medicine

## 2017-02-08 ENCOUNTER — Encounter (HOSPITAL_COMMUNITY): Payer: Self-pay | Admitting: Emergency Medicine

## 2017-02-08 ENCOUNTER — Emergency Department (HOSPITAL_COMMUNITY): Payer: Medicare Other

## 2017-02-08 DIAGNOSIS — K5730 Diverticulosis of large intestine without perforation or abscess without bleeding: Secondary | ICD-10-CM | POA: Diagnosis present

## 2017-02-08 DIAGNOSIS — J439 Emphysema, unspecified: Secondary | ICD-10-CM | POA: Diagnosis not present

## 2017-02-08 DIAGNOSIS — K122 Cellulitis and abscess of mouth: Secondary | ICD-10-CM | POA: Diagnosis not present

## 2017-02-08 DIAGNOSIS — N183 Chronic kidney disease, stage 3 unspecified: Secondary | ICD-10-CM

## 2017-02-08 DIAGNOSIS — J384 Edema of larynx: Secondary | ICD-10-CM | POA: Diagnosis present

## 2017-02-08 DIAGNOSIS — Z87891 Personal history of nicotine dependence: Secondary | ICD-10-CM | POA: Diagnosis not present

## 2017-02-08 DIAGNOSIS — R220 Localized swelling, mass and lump, head: Secondary | ICD-10-CM

## 2017-02-08 DIAGNOSIS — F0390 Unspecified dementia without behavioral disturbance: Secondary | ICD-10-CM | POA: Diagnosis not present

## 2017-02-08 DIAGNOSIS — R0600 Dyspnea, unspecified: Secondary | ICD-10-CM | POA: Diagnosis not present

## 2017-02-08 DIAGNOSIS — E785 Hyperlipidemia, unspecified: Secondary | ICD-10-CM | POA: Diagnosis present

## 2017-02-08 DIAGNOSIS — R221 Localized swelling, mass and lump, neck: Secondary | ICD-10-CM | POA: Diagnosis not present

## 2017-02-08 DIAGNOSIS — R918 Other nonspecific abnormal finding of lung field: Secondary | ICD-10-CM

## 2017-02-08 DIAGNOSIS — R061 Stridor: Secondary | ICD-10-CM | POA: Diagnosis not present

## 2017-02-08 DIAGNOSIS — C61 Malignant neoplasm of prostate: Secondary | ICD-10-CM | POA: Diagnosis not present

## 2017-02-08 DIAGNOSIS — I10 Essential (primary) hypertension: Secondary | ICD-10-CM | POA: Diagnosis present

## 2017-02-08 DIAGNOSIS — Z79899 Other long term (current) drug therapy: Secondary | ICD-10-CM | POA: Diagnosis not present

## 2017-02-08 DIAGNOSIS — F03C0 Unspecified dementia, severe, without behavioral disturbance, psychotic disturbance, mood disturbance, and anxiety: Secondary | ICD-10-CM | POA: Diagnosis present

## 2017-02-08 DIAGNOSIS — K1120 Sialoadenitis, unspecified: Secondary | ICD-10-CM

## 2017-02-08 DIAGNOSIS — I129 Hypertensive chronic kidney disease with stage 1 through stage 4 chronic kidney disease, or unspecified chronic kidney disease: Secondary | ICD-10-CM | POA: Diagnosis present

## 2017-02-08 DIAGNOSIS — Z8546 Personal history of malignant neoplasm of prostate: Secondary | ICD-10-CM | POA: Diagnosis not present

## 2017-02-08 DIAGNOSIS — J029 Acute pharyngitis, unspecified: Secondary | ICD-10-CM | POA: Diagnosis not present

## 2017-02-08 DIAGNOSIS — F03B0 Unspecified dementia, moderate, without behavioral disturbance, psychotic disturbance, mood disturbance, and anxiety: Secondary | ICD-10-CM | POA: Diagnosis present

## 2017-02-08 DIAGNOSIS — E784 Other hyperlipidemia: Secondary | ICD-10-CM | POA: Diagnosis not present

## 2017-02-08 DIAGNOSIS — R911 Solitary pulmonary nodule: Secondary | ICD-10-CM | POA: Diagnosis not present

## 2017-02-08 DIAGNOSIS — E7800 Pure hypercholesterolemia, unspecified: Secondary | ICD-10-CM | POA: Diagnosis present

## 2017-02-08 DIAGNOSIS — J989 Respiratory disorder, unspecified: Secondary | ICD-10-CM | POA: Diagnosis not present

## 2017-02-08 HISTORY — DX: Pure hypercholesterolemia, unspecified: E78.00

## 2017-02-08 HISTORY — DX: Unspecified dementia, unspecified severity, without behavioral disturbance, psychotic disturbance, mood disturbance, and anxiety: F03.90

## 2017-02-08 HISTORY — DX: Chronic kidney disease, stage 3 unspecified: N18.30

## 2017-02-08 HISTORY — DX: Chronic kidney disease, stage 3 (moderate): N18.3

## 2017-02-08 LAB — COMPREHENSIVE METABOLIC PANEL
ALBUMIN: 3.4 g/dL — AB (ref 3.5–5.0)
ALT: 24 U/L (ref 17–63)
AST: 21 U/L (ref 15–41)
Alkaline Phosphatase: 63 U/L (ref 38–126)
Anion gap: 8 (ref 5–15)
BILIRUBIN TOTAL: 0.9 mg/dL (ref 0.3–1.2)
BUN: 10 mg/dL (ref 6–20)
CHLORIDE: 109 mmol/L (ref 101–111)
CO2: 23 mmol/L (ref 22–32)
Calcium: 8.7 mg/dL — ABNORMAL LOW (ref 8.9–10.3)
Creatinine, Ser: 1.29 mg/dL — ABNORMAL HIGH (ref 0.61–1.24)
GFR calc Af Amer: 60 mL/min — ABNORMAL LOW (ref >60)
GFR calc non Af Amer: 51 mL/min — ABNORMAL LOW (ref >60)
GLUCOSE: 98 mg/dL (ref 65–99)
POTASSIUM: 3.7 mmol/L (ref 3.5–5.1)
Sodium: 140 mmol/L (ref 135–145)
TOTAL PROTEIN: 6.6 g/dL (ref 6.5–8.1)

## 2017-02-08 LAB — CBC WITH DIFFERENTIAL/PLATELET
BASOS ABS: 0 10*3/uL (ref 0.0–0.1)
Basophils Relative: 0 %
Eosinophils Absolute: 0.1 10*3/uL (ref 0.0–0.7)
Eosinophils Relative: 1 %
HEMATOCRIT: 37 % — AB (ref 39.0–52.0)
Hemoglobin: 12.1 g/dL — ABNORMAL LOW (ref 13.0–17.0)
LYMPHS PCT: 19 %
Lymphs Abs: 2.2 10*3/uL (ref 0.7–4.0)
MCH: 29.4 pg (ref 26.0–34.0)
MCHC: 32.7 g/dL (ref 30.0–36.0)
MCV: 90 fL (ref 78.0–100.0)
Monocytes Absolute: 0.6 10*3/uL (ref 0.1–1.0)
Monocytes Relative: 5 %
NEUTROS ABS: 8.9 10*3/uL — AB (ref 1.7–7.7)
Neutrophils Relative %: 75 %
Platelets: 167 10*3/uL (ref 150–400)
RBC: 4.11 MIL/uL — AB (ref 4.22–5.81)
RDW: 14.1 % (ref 11.5–15.5)
WBC: 11.8 10*3/uL — ABNORMAL HIGH (ref 4.0–10.5)

## 2017-02-08 LAB — URINALYSIS, ROUTINE W REFLEX MICROSCOPIC
Bilirubin Urine: NEGATIVE
GLUCOSE, UA: NEGATIVE mg/dL
Ketones, ur: NEGATIVE mg/dL
LEUKOCYTES UA: NEGATIVE
Nitrite: NEGATIVE
PH: 5 (ref 5.0–8.0)
Protein, ur: NEGATIVE mg/dL

## 2017-02-08 LAB — URINALYSIS, MICROSCOPIC (REFLEX)

## 2017-02-08 MED ORDER — ACETAMINOPHEN 325 MG PO TABS: 650.0000 mg | ORAL_TABLET | Freq: Four times a day (QID) | ORAL | Status: DC | PRN

## 2017-02-08 MED ORDER — PIPERACILLIN-TAZOBACTAM 4.5 G IVPB: 4.5000 g | Freq: Once | INTRAVENOUS | Status: DC

## 2017-02-08 MED ORDER — ATORVASTATIN CALCIUM 40 MG PO TABS
40.0000 mg | ORAL_TABLET | Freq: Every day | ORAL | Status: DC
  Administered 2017-02-09 – 2017-02-11 (×3): 40 mg via ORAL
  Filled 2017-02-08 (×3): qty 1

## 2017-02-08 MED ORDER — SODIUM CHLORIDE 0.9% FLUSH: 3.0000 mL | Freq: Two times a day (BID) | INTRAVENOUS | Status: DC

## 2017-02-08 MED ORDER — CLINDAMYCIN PHOSPHATE 900 MG/50ML IV SOLN
900.0000 mg | Freq: Once | INTRAVENOUS | Status: AC
  Administered 2017-02-08: 900 mg via INTRAVENOUS
  Filled 2017-02-08: qty 50

## 2017-02-08 MED ORDER — ONDANSETRON HCL 4 MG PO TABS: 4.0000 mg | ORAL_TABLET | Freq: Four times a day (QID) | ORAL | Status: DC | PRN

## 2017-02-08 MED ORDER — SODIUM CHLORIDE 0.9 % IV SOLN: 250.0000 mL | INTRAVENOUS | Status: DC | PRN

## 2017-02-08 MED ORDER — IOPAMIDOL (ISOVUE-300) INJECTION 61%
INTRAVENOUS | Status: AC
  Administered 2017-02-08: 100 mL
  Filled 2017-02-08: qty 100

## 2017-02-08 MED ORDER — SODIUM CHLORIDE 0.9 % IV BOLUS (SEPSIS)
1000.0000 mL | Freq: Once | INTRAVENOUS | Status: AC
  Administered 2017-02-08: 1000 mL via INTRAVENOUS

## 2017-02-08 MED ORDER — PIPERACILLIN-TAZOBACTAM 3.375 G IVPB
3.3750 g | Freq: Three times a day (TID) | INTRAVENOUS | Status: DC
  Administered 2017-02-08 – 2017-02-11 (×8): 3.375 g via INTRAVENOUS
  Filled 2017-02-08 (×9): qty 50

## 2017-02-08 MED ORDER — IRBESARTAN 300 MG PO TABS: 300.0000 mg | ORAL_TABLET | Freq: Every day | ORAL | Status: DC

## 2017-02-08 MED ORDER — ONDANSETRON HCL 4 MG/2ML IJ SOLN: 4.0000 mg | Freq: Four times a day (QID) | INTRAMUSCULAR | Status: DC | PRN

## 2017-02-08 MED ORDER — OXYCODONE HCL 5 MG PO TABS: 5.0000 mg | ORAL_TABLET | ORAL | Status: DC | PRN

## 2017-02-08 MED ORDER — AMLODIPINE BESYLATE 5 MG PO TABS
5.0000 mg | ORAL_TABLET | Freq: Every day | ORAL | Status: DC
  Administered 2017-02-09 – 2017-02-11 (×3): 5 mg via ORAL
  Filled 2017-02-08 (×3): qty 1

## 2017-02-08 MED ORDER — DEXAMETHASONE SODIUM PHOSPHATE 10 MG/ML IJ SOLN
10.0000 mg | Freq: Once | INTRAMUSCULAR | Status: AC
  Administered 2017-02-08: 10 mg via INTRAVENOUS
  Filled 2017-02-08: qty 1

## 2017-02-08 MED ORDER — PIPERACILLIN-TAZOBACTAM 3.375 G IVPB 30 MIN
3.3750 g | Freq: Once | INTRAVENOUS | Status: AC
  Administered 2017-02-08: 3.375 g via INTRAVENOUS
  Filled 2017-02-08: qty 50

## 2017-02-08 MED ORDER — AMLODIPINE-OLMESARTAN 5-40 MG PO TABS: 1.0000 | ORAL_TABLET | Freq: Every day | ORAL | Status: DC

## 2017-02-08 MED ORDER — DONEPEZIL HCL 5 MG PO TABS
5.0000 mg | ORAL_TABLET | Freq: Every day | ORAL | Status: DC
  Administered 2017-02-09: 5 mg via ORAL
  Filled 2017-02-08: qty 1

## 2017-02-08 MED ORDER — ACETAMINOPHEN 650 MG RE SUPP: 650.0000 mg | Freq: Four times a day (QID) | RECTAL | Status: DC | PRN

## 2017-02-08 MED ORDER — SODIUM CHLORIDE 0.9% FLUSH: 3.0000 mL | INTRAVENOUS | Status: DC | PRN

## 2017-02-08 NOTE — Progress Notes (Signed)
Dr. Barbaraann Faster notified of arrival to unit

## 2017-02-08 NOTE — ED Notes (Signed)
Dr. Gilford Raid, the pt family member wants you to call the daughter in [place] to tell her the results before telling the pt. The daughter here, is asking you to do this.

## 2017-02-08 NOTE — ED Provider Notes (Signed)
[facility] DEPT Provider Note   CSN: [id number] Arrival date & time: 02/08/17  0909     History   Chief Complaint Chief Complaint  Patient presents with  . Follow-up    HPI Jerry [name] is a 81 y.o. male.  Pt presents to the ED today with facial swelling.  The pt was seen here yesterday and diagnosed with right parotiditis.  The pt was d/w Dr. Wilburn Cornelia (ENT) who recommended outpatient treatment with antibiotics (augmentin).  The pt comes back today because symptoms are worse.  He now has swelling under his chin.  He had a hard time sleeping last night because the swelling felt like it was pressing on his throat.  Pt also had a lung mass seen on his RUL on the CT yesterday suspicious for cancer.      Past Medical History:  Diagnosis Date  . Cancer of prostate ([place]) 2001  . Chronic kidney disease (CKD), stage III (moderate)    [name] 02/08/2017  . Dementia   . DIVERTICULOSIS, COLON 07/25/2007   Qualifier: Diagnosis of  By: [name], LPN, [name]   . High cholesterol   . Hypertension     Patient Active Problem List   Diagnosis Date Noted  . Pulmonary emphysema ([place])   . Parotiditis 02/08/2017  . Facial swelling 02/08/2017  . CKD (chronic kidney disease) stage 3, GFR 30-59 ml/min 02/08/2017  . Lung nodule, multiple 02/08/2017  . Submandibular space infection   . Moderate dementia without behavioral disturbance 07/17/2015  . Memory loss 05/08/2015  . Hyperlipidemia 10/15/2014  . Allergic rhinitis 10/15/2014  . Overactive bladder 10/15/2014  . CARDIAC MURMUR 01/10/2011  . ORGANIC IMPOTENCE 08/12/2010  . Gout of big toe 08/04/2008  . Essential hypertension 07/25/2007  . PROSTATE CANCER, HX OF 07/25/2007    Past Surgical History:  Procedure Laterality Date  . EYE SURGERY    . INSERTION PROSTATE RADIATION SEED  07/2000   [name] 05/09/2011  . PENILE PROSTHESIS IMPLANT  02/2002   Insertion of Mentor three piece inflatable penile prosthesis./notes 05/09/2011    . PENILE PROSTHESIS PLACEMENT  06/2003   Placement of Mentor rod prosthesis/notes 05/09/2011  . REFRACTIVE SURGERY Bilateral   . REMOVAL OF PENILE PROSTHESIS  06/2003   Removal of three-piece Mentor prosthesis/notes 05/09/2011  . SCROTAL SURGERY  06/2003   Repair of scrotal defect/notes 05/09/2011       Home Medications    Prior to Admission medications   Medication Sig Start Date End Date Taking? Authorizing Provider  amLODipine-olmesartan (AZOR) 5-40 MG tablet Take 1 tablet by mouth daily. 08/08/16  Yes Marin Olp, MD  atorvastatin (LIPITOR) 40 MG tablet Take 1 tablet (40 mg total) by mouth daily. 08/08/16 08/08/17 Yes Marin Olp, MD  donepezil (ARICEPT) 5 MG tablet Take once a day Patient taking differently: Take 5 mg by mouth at bedtime. Take once a day 08/08/16  Yes Marin Olp, MD  amoxicillin-clavulanate (AUGMENTIN) 875-125 MG tablet Take 1 tablet by mouth 2 (two) times daily. X 10 days 02/11/17 02/21/17  Ripudeep Krystal Eaton, MD    Family History Family History  Problem Relation Age of Onset  . Early death Father     cirrhosis  . Alcoholism Father   . Cirrhosis    . Breast cancer Sister     Social History Social History  Substance Use Topics  . Smoking status: Former Smoker    Packs/day: 0.50    Quit date: 12/26/1988  . Smokeless tobacco: Never  Used     Comment: "I don't remember how long really" (02/08/2017)  . Alcohol use No     Allergies   Patient has no known allergies.   Review of Systems Review of Systems  Respiratory: Positive for shortness of breath.   Musculoskeletal: Positive for neck pain.       Neck swelling  All other systems reviewed and are negative.    Physical Exam Updated Vital Signs BP 132/64 (BP Location: Right Arm)   Pulse (!) 52   Temp 97.8 F (36.6 C) (Oral)   Resp 19   Ht '5\' 9"'$  (1.753 m)   SpO2 100%   Physical Exam  Constitutional: He is oriented to person, place, and time. He appears well-developed and  well-nourished.  HENT:  Head: Normocephalic and atraumatic.  Right Ear: External ear normal.  Left Ear: External ear normal.  Nose: Nose normal.  Eyes: Conjunctivae and EOM are normal. Pupils are equal, round, and reactive to light.  Neck:    Cardiovascular: Normal rate, regular rhythm, normal heart sounds and intact distal pulses.   Pulmonary/Chest: Effort normal and breath sounds normal.  Abdominal: Soft. Bowel sounds are normal.  Musculoskeletal: Normal range of motion.  Neurological: He is alert and oriented to person, place, and time.  Skin: Skin is warm.  Psychiatric: He has a normal mood and affect. His behavior is normal. Judgment and thought content normal.  Nursing note and vitals reviewed.    ED Treatments / Results  Labs (all labs ordered are listed, but only abnormal results are displayed) Labs Reviewed  CULTURE, BLOOD (ROUTINE X 2) - Abnormal; Notable for the following:       Result Value   Culture   (*)    Value: STAPHYLOCOCCUS SPECIES (COAGULASE NEGATIVE) THE SIGNIFICANCE OF ISOLATING THIS ORGANISM FROM A SINGLE SET OF BLOOD CULTURES WHEN MULTIPLE SETS ARE DRAWN IS UNCERTAIN. PLEASE NOTIFY THE MICROBIOLOGY DEPARTMENT WITHIN ONE WEEK IF SPECIATION AND SENSITIVITIES ARE REQUIRED.    All other components within normal limits  BLOOD CULTURE ID PANEL (REFLEXED) - Abnormal; Notable for the following:    Staphylococcus species DETECTED (*)    Methicillin resistance DETECTED (*)    All other components within normal limits  COMPREHENSIVE METABOLIC PANEL - Abnormal; Notable for the following:    Creatinine, Ser 1.29 (*)    Calcium 8.7 (*)    Albumin 3.4 (*)    GFR calc non Af Amer 51 (*)    GFR calc Af Amer 60 (*)    All other components within normal limits  CBC WITH DIFFERENTIAL/PLATELET - Abnormal; Notable for the following:    WBC 11.8 (*)    RBC 4.11 (*)    Hemoglobin 12.1 (*)    HCT 37.0 (*)    Neutro Abs 8.9 (*)    All other components within normal  limits  URINALYSIS, ROUTINE W REFLEX MICROSCOPIC - Abnormal; Notable for the following:    Specific Gravity, Urine >1.030 (*)    Hgb urine dipstick MODERATE (*)    All other components within normal limits  URINALYSIS, MICROSCOPIC (REFLEX) - Abnormal; Notable for the following:    Bacteria, UA FEW (*)    Squamous Epithelial / LPF 0-5 (*)    All other components within normal limits  CBC - Abnormal; Notable for the following:    RBC 4.17 (*)    Hemoglobin 12.0 (*)    HCT 37.2 (*)    All other components within normal limits  COMPREHENSIVE METABOLIC  PANEL - Abnormal; Notable for the following:    Glucose, Bld 161 (*)    Creatinine, Ser 1.49 (*)    Calcium 8.6 (*)    Albumin 3.3 (*)    GFR calc non Af Amer 43 (*)    GFR calc Af Amer 50 (*)    All other components within normal limits  GLUCOSE, CAPILLARY - Abnormal; Notable for the following:    Glucose-Capillary 141 (*)    All other components within normal limits  BASIC METABOLIC PANEL - Abnormal; Notable for the following:    Glucose, Bld 127 (*)    Creatinine, Ser 1.29 (*)    Calcium 8.1 (*)    GFR calc non Af Amer 51 (*)    GFR calc Af Amer 60 (*)    All other components within normal limits  CULTURE, BLOOD (ROUTINE X 2)  APTT  PROTIME-INR    EKG  EKG Interpretation None       Radiology No results found.  Procedures Procedures (including critical care time)  Medications Ordered in ED Medications  iopamidol (ISOVUE-300) 61 % injection (not administered)  sodium chloride 0.9 % bolus 1,000 mL (0 mLs Intravenous Stopped 02/08/17 1258)  clindamycin (CLEOCIN) IVPB 900 mg (0 mg Intravenous Stopped 02/08/17 1228)  iopamidol (ISOVUE-300) 61 % injection (100 mLs  Contrast Given 02/08/17 1306)  piperacillin-tazobactam (ZOSYN) IVPB 3.375 g (3.375 g Intravenous New Bag/Given 02/08/17 1628)  dexamethasone (DECADRON) injection 10 mg (10 mg Intravenous Given 02/08/17 1756)  sodium chloride 0.9 % bolus 1,000 mL (1,000 mLs  Intravenous Given 02/08/17 1756)  iopamidol (ISOVUE-300) 61 % injection 15 mL (15 mLs Oral Contrast Given 02/09/17 1304)     Initial Impression / Assessment and Plan / ED Course  I have reviewed the triage vital signs and the nursing notes.  Pertinent labs & imaging results that were available during my care of the patient were reviewed by me and considered in my medical decision making (see chart for details).   Pt d/w his daughter as she is the POA.  She lives OOT.  Daughter is also concerned as his dementia seems to have been worsening and she's worried about him being able to care for himself once he gets home.  Pt d/w Dr. Janace Hoard (ENT) who recommended repeat CT scan to reevaluate the new swelling.  Dr. Janace Hoard notified about results of CT scan.  He will see pt.  He recommends additional zosyn and hospitalist admission.  CRITICAL CARE Performed by: Isla Pence   Total critical care time: 30 minutes  Critical care time was exclusive of separately billable procedures and treating other patients.  Critical care was necessary to treat or prevent imminent or life-threatening deterioration.  Critical care was time spent personally by me on the following activities: development of treatment plan with patient and/or surrogate as well as nursing, discussions with consultants, evaluation of patient's response to treatment, examination of patient, obtaining history from patient or surrogate, ordering and performing treatments and interventions, ordering and review of laboratory studies, ordering and review of radiographic studies, pulse oximetry and re-evaluation of patient's condition.   Final Clinical Impressions(s) / ED Diagnoses   Final diagnoses:  Parotiditis  Submandibular space infection  Pharyngitis, unspecified etiology  Mass of upper lobe of right lung  Lung nodule, multiple    New Prescriptions Discharge Medication List as of 02/11/2017  9:27 AM       Isla Pence,  MD 02/16/17 1334

## 2017-02-08 NOTE — H&P (Signed)
History and Physical    Jerry [name] KGU:[id number] DOB: 07/17/1936 DOA: 02/08/2017  PCP: Jerry Reddish, MD Patient coming from: Home  Chief Complaint: facial swelling  HPI: Jerry [name] is a 81 y.o. male with medical history significant of prostate ca, diverticulosis, HTN, presentin w/  R sided facial swelling and nec swelling. Painful. Present for 3 days. ocnstant and getting worse. Worse w/ palpation and turning of neck. Denies dysphagia or difficulty breathing. Denies fevers, chest pain, palpitations, purulent discharge into his mouth, neck stiffness, headache, shortness of breath, palpitations, donning/vomiting, diarrhea, back pain, rash. Initially seen in the ED on 02/07/2017 and started on Augmentin after eating he consulted with ENT.. Patient has been compliant with his anabolic regimen but endorses worsening of symptoms in the last 24 hours. On day of admission swelling has extended now beyond the midline of the chin and has become increasingly more painful.    ED Course: ENT consult. Repeat imaging of patient's neck shows possible malignancy. ENT reconsult and will follow. Antibiotics broadened to Zosyn.  Review of Systems: As per HPI otherwise 10 point review of systems negative.   Ambulatory Status:no restrictions  Past Medical History:  Diagnosis Date  . Cancer of prostate ([place])   . Dementia   . Diverticulosis   . DIVERTICULOSIS, COLON 07/25/2007   Qualifier: Diagnosis of  By: Jerry Norman, LPN, Jerry [name]   . Hypertension     Past Surgical History:  Procedure Laterality Date  . penile pump implant     patient denies  . PROSTATECTOMY     seed implant    Social History   Social History  . Marital status: Divorced    Spouse name: N/A  . Number of children: N/A  . Years of education: N/A   Occupational History  . Not on file.   Social History Main Topics  . Smoking status: Former Smoker    Quit date: 12/26/1988  . Smokeless tobacco: Never Used  . Alcohol  use No  . Drug use: No  . Sexual activity: Not Currently   Other Topics Concern  . Not on file   Social History Narrative   Separated since 2010. 4 kids. 4 grandkids. 2 greatgrandkids.       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 Dementia unfortunately stopped these travels.     No Known Allergies  Family History  Problem Relation Age of Onset  . Early death Father     cirrhosis  . Alcoholism Father   . Cirrhosis    . Breast cancer Sister     Prior to Admission medications   Medication Sig Start Date End Date Taking? Authorizing Provider  amLODipine-olmesartan (AZOR) 5-40 MG tablet Take 1 tablet by mouth daily. 08/08/16  Yes Jerry Olp, MD  amoxicillin-clavulanate (AUGMENTIN) 875-125 MG tablet Take 1 tablet by mouth 2 (two) times daily. 02/07/17 02/17/17 Yes Jerry Etienne, DO  atorvastatin (LIPITOR) 40 MG tablet Take 1 tablet (40 mg total) by mouth daily. 08/08/16 08/08/17 Yes Jerry Olp, MD  donepezil (ARICEPT) 5 MG tablet Take once a day Patient taking differently: Take 5 mg by mouth at bedtime. Take once a day 08/08/16  Yes Jerry Olp, MD  memantine (NAMENDA) 10 MG tablet TAKE 1 TABLET BY MOUTH DAILY FOR 1 WEEK,THEN INCREASE TO 1 TABLET TWICE DAILY AND CONTINUE Patient not taking: Reported on 02/08/2017 09/27/16   Jerry Sprang, MD    Physical Exam: Vitals:  02/08/17 1200 02/08/17 1215 02/08/17 1230 02/08/17 1709  BP: 144/73 141/69 123/92 (!) 142/72  Pulse: 62 69 63 80  Resp:  '16 16 16  '$ Temp:    99 F (37.2 C)  TempSrc:    Oral  SpO2: 99% 99% 100% 98%  Height:         General:  Appears calm and comfortable Eyes:  PERRL, EOMI, normal lids, iris ENT: . Moist mucous membranes. nml hearing Neck: No thyromegaly, Erythema and swelling from the ankle of the jaw on the right anteriorly. Tender to palpation Cardiovascular: III/VI systolic murmur RRR,   Trace LE edema.  Respiratory:  CTA bilaterally, no w/r/r.  Normal respiratory effort. Abdomen:  soft, ntnd, NABS Skin:  no rash or induration seen on limited exam Musculoskeletal:  grossly normal tone BUE/BLE, good ROM, no bony abnormality Psychiatric:  grossly normal mood and affect, speech fluent and appropriate, AOx3 Neurologic:  CN 2-12 grossly intact, moves all extremities in coordinated fashion, sensation intact  Labs on Admission: I have personally reviewed following labs and imaging studies  CBC:  Recent Labs Lab 02/07/17 1405 02/08/17 1101  WBC  --  11.8*  NEUTROABS  --  8.9*  HGB 14.6 12.1*  HCT 43.0 37.0*  MCV  --  90.0  PLT  --  616   Basic Metabolic Panel:  Recent Labs Lab 02/07/17 1405 02/08/17 1101  NA 144 140  K 4.1 3.7  CL 106 109  CO2  --  23  GLUCOSE 80 98  BUN 13 10  CREATININE 1.30* 1.29*  CALCIUM  --  8.7*   GFR: CrCl cannot be calculated (Unknown ideal weight.). Liver Function Tests:  Recent Labs Lab 02/08/17 1101  AST 21  ALT 24  ALKPHOS 63  BILITOT 0.9  PROT 6.6  ALBUMIN 3.4*   No results for input(s): LIPASE, AMYLASE in the last 168 hours. No results for input(s): AMMONIA in the last 168 hours. Coagulation Profile: No results for input(s): INR, PROTIME in the last 168 hours. Cardiac Enzymes: No results for input(s): CKTOTAL, CKMB, CKMBINDEX, TROPONINI in the last 168 hours. BNP (last 3 results) No results for input(s): PROBNP in the last 8760 hours. HbA1C: No results for input(s): HGBA1C in the last 72 hours. CBG: No results for input(s): GLUCAP in the last 168 hours. Lipid Profile: No results for input(s): CHOL, HDL, LDLCALC, TRIG, CHOLHDL, LDLDIRECT in the last 72 hours. Thyroid Function Tests: No results for input(s): TSH, T4TOTAL, FREET4, T3FREE, THYROIDAB in the last 72 hours. Anemia Panel: No results for input(s): VITAMINB12, FOLATE, FERRITIN, TIBC, IRON, RETICCTPCT in the last 72 hours. Urine analysis:    Component Value Date/Time   COLORURINE YELLOW 02/08/2017 1127    APPEARANCEUR CLEAR 02/08/2017 1127   LABSPEC >1.030 (H) 02/08/2017 1127   PHURINE 5.0 02/08/2017 1127   GLUCOSEU NEGATIVE 02/08/2017 1127   HGBUR MODERATE (A) 02/08/2017 1127   BILIRUBINUR NEGATIVE 02/08/2017 1127   BILIRUBINUR n 08/01/2016 1147   KETONESUR NEGATIVE 02/08/2017 1127   PROTEINUR NEGATIVE 02/08/2017 1127   UROBILINOGEN 0.2 08/01/2016 1147   NITRITE NEGATIVE 02/08/2017 1127   LEUKOCYTESUR NEGATIVE 02/08/2017 1127    Creatinine Clearance: CrCl cannot be calculated (Unknown ideal weight.).  Sepsis Labs: '@LABRCNTIP'$ (procalcitonin:4,lacticidven:4) )No results found for this or any previous visit (from the past 240 hour(s)).   Radiological Exams on Admission: Ct Soft Tissue Neck W Contrast  Result Date: 02/08/2017 CLINICAL DATA:  81 y/o M; neck swelling with difficulty swallowing and breathing. EXAM: CT NECK  WITH CONTRAST TECHNIQUE: Multidetector CT imaging of the neck was performed using the standard protocol following the bolus administration of intravenous contrast. CONTRAST:  128m ISOVUE-300 IOPAMIDOL (ISOVUE-300) INJECTION 61% COMPARISON:  Concurrent CT of the chest.  CT neck dated 02/07/2017. FINDINGS: Pharynx and larynx: Interval development of severe swelling of the right sided of oropharyngeal mucosa extending into right lateral and posterior walls of the hypopharynx and larynx with essentially complete effacement of the glottis. Additionally there is a small retropharyngeal fluid collection which extends from the C1 -C6 levels. There is diffuse edema in the right face subcutaneous fat, bilateral submandibular spaces, right parotid space, and right-sided parapharyngeal fat. Subcutaneous edema extends into the anterior neck and right supraclavicular fossa. Salivary glands: Persistent swelling and enhancement of the right parotid gland with additional swelling of right submandibular greater than left submandibular glands progressed from prior study. Thyroid: Subcentimeter  nodules. Lymph nodes: Reactive upper cervical prominent lymph nodes. Vascular: Atherosclerosis of bilateral carotid bifurcations with mild stenosis on the left. Limited intracranial: Negative. Visualized orbits: Negative. Mastoids and visualized paranasal sinuses: Clear. Skeleton: Cervical spondylosis with predominantly discogenic degenerative changes. No high-grade bony canal stenosis. Upper chest: 12 mm spiculated nodule in the right upper lobe of the lung. Mild emphysema. Other: None. IMPRESSION: 1. Interval development of severe swelling of right-sided oropharyngeal mucosa extending into the right lateral and posterior walls of the hypopharynx and larynx with essentially complete effacement of the glottic airway. Findings are compatible with severe acute pharyngitis. 2. Small retropharyngeal fluid collection. 3. Diffuse edema in subcutaneous fat of right face and anterior neck extending into submandibular spaces, right parotid space, and right parapharyngeal fat. 4. Persistent swelling of the right parotid gland and additional swelling of right greater than left submandibular glands which may be reactive or represent acute sialadenitis. 5. No discrete peripherally enhancing organized abscess identified. 6. Please refer to concurrent CT of the chest for evaluation of lungs and mediastinum. These results were called by telephone at the time of interpretation on 02/08/2017 at 2:09 pm to Dr. JIsla Pence, who verbally acknowledged these results. Electronically Signed   By: LKristine GarbeM.D.   On: 02/08/2017 14:11   Ct Soft Tissue Neck W Contrast  Result Date: 02/07/2017 CLINICAL DATA:  Right-sided facial swelling below jaw. EXAM: CT NECK WITH CONTRAST TECHNIQUE: Multidetector CT imaging of the neck was performed using the standard protocol following the bolus administration of intravenous contrast. CONTRAST:  742mISOVUE-300 IOPAMIDOL (ISOVUE-300) INJECTION 61% COMPARISON:  None. FINDINGS: Pharynx  and larynx: Mild edema in the right parapharyngeal space related to parotid swelling. Airway slightly displaced to the left. No airway compromise. No pharyngeal mass lesion. Salivary glands: Marked enlargement of the right parotid gland which shows diffuse edema. There is also extensive edema in the soft tissues around the right parotid gland extending into the subcutaneous fat and skin region. No abscess. No parotid mass or stone. Mass extends into the right parapharyngeal space. Edema also extends towards the right submandibular gland which also appears slightly enlarged. Left submandibular and parotid glands normal. Thyroid: Negative Lymph nodes: No pathologic lymph nodes. Vascular: Right jugular vein is compressed due to edema in the parotid gland and adjacent soft tissues. There is no intraluminal thrombus. There is carotid artery calcification bilaterally. Limited intracranial: Negative Visualized orbits: Negative Mastoids and visualized paranasal sinuses: Negative Skeleton: Cervical degenerative change. No dental abscess. No acute skeletal abnormality. Upper chest: Right upper lobe spiculated nodule measuring 12 mm, suspicious for neoplasm. This is incompletely  evaluated on the current study. Other: None IMPRESSION: Right parotid gland enlargement and surrounding edema most compatible with acute infection. No abscess or mass. Edema extends into the right parapharyngeal space and around the right submandibular gland. No airway compromise. The right jugular vein is compressed and may be occluded proximally but there is no thrombus present. Right upper lobe spiculated mass 12 mm. This is concerning for lung carcinoma. CT chest with contrast suggest for further evaluation. **An incidental finding of potential clinical significance has been found. Right upper lobe mass** Electronically Signed   By: Franchot Gallo M.D.   On: 02/07/2017 15:14   Ct Chest W Contrast  Result Date: 02/08/2017 CLINICAL DATA:  Chest  mass seen on neck CT. EXAM: CT CHEST WITH CONTRAST TECHNIQUE: Multidetector CT imaging of the chest was performed during intravenous contrast administration. CONTRAST:  147m ISOVUE-300 IOPAMIDOL (ISOVUE-300) INJECTION 61% COMPARISON:  None available FINDINGS: Cardiovascular: No cardiomegaly or pericardial effusion. Mild to moderate aortic valvular calcification. Atherosclerosis, including the coronary arteries. Mediastinum/Nodes: Negative for inflammation or adenopathy. Lungs/Pleura: Spiculated mixed density nodule in the right upper lobe measuring 15 mm. This is suspicious for bronchogenic carcinoma, especially adenocarcinoma. Pleural tag suspected on the coronal reformats. There is also a 14 mm solid nodule in the lateral costophrenic sulcus on the right (measured coronally). Subpleural nodules are likely lymph nodes, and will be followed. Mild centrilobular emphysema on reformats. Upper Abdomen: No acute finding Musculoskeletal: Spondylosis.  No acute or aggressive finding. IMPRESSION: 1. Transspatial neck infection with supraglottic laryngeal edema is described on dedicated neck CT. No extension into the mediastinum. Recommend aggressive treatment. 2. 15 mm mixed density nodule in the right upper lobe is suspicious for bronchogenic carcinoma -especially adenocarcinoma. After convalescence PET-CT is recommended. 3. 14 x 7 mm solid nodule in the right lower lobe. 4. Mild centrilobular emphysema. Electronically Signed   By: JMonte FantasiaM.D.   On: 02/08/2017 14:11    EKG: Pending  Assessment/Plan Active Problems:   Essential hypertension   PROSTATE CANCER, HX OF   Hyperlipidemia   Moderate dementia without behavioral disturbance   Facial swelling   CKD (chronic kidney disease) stage 3, GFR 30-59 ml/min   Lung nodules   Facial/Neck infection: CT showing trans-spatial neck infection w/ supraglottic laryngeal edema. No abscess or intra-salivary gland infection or stone/obstruction. 24hrs of  augmentin outpt w/ worsening sx. Clinda and Zosyn given in ED. Pt denies dysphagia or airway obstruction. Rapid progression - Continue Zosyn - Decadron '10mg'$  x1 to help w/ rapid progression of swelling - ENT consulted by EDP and will follow - BCX (obtained while on ABX)  Lung Nodules: 15 mm mixed density nodule in the right upper lobe, and 14 x 7 mm solid nodule in the right lower lobe. Concerning for malignancy - pt and family aware.  - CT contrast abd/pelvis (ordered for 2/15 due to contrasted study on 2/14 and CKD) - IR consult for biopsy and path (order placed 02/08/17) - further workup outpt pending path results.   CKD: Cr 1.3. At baseline. Contrasted study on 2/14 and another planned for 2/15 - IVF bolus - BMET in am prior to CT  HTN: - contineu Azor  HLD: - continue Lipitor  Dementia: mild and at baseline. Family member acts as POA - continue namenda, aricept   DVT prophylaxis: SCD  Code Status: FULL  Family Communication: none  Disposition Plan: pending improvement and likely biopsy of nodules  Consults called: IR , ENT Admission status: inpt  MERRELL, DAVID J MD [facility]  If 7PM-7AM, please contact night-coverage [email or web address] Password [email or web address]  02/08/2017, 5:36 PM

## 2017-02-08 NOTE — Consult Note (Signed)
Reason for Consult: Airway swelling Referring Physician: Hospitalist  Jerry [name] is an 81 y.o. male.  HPI: History of swelling in his right parotid gland. This started a few days ago. He was seen in the emergency room yesterday with swelling of the parotid. He underwent a CT scan which showed some swelling extending into the parapharyngeal space but no evidence of abscess. He was treated with clindamycin and sent home. He then overnight had more swelling in the submental area and some difficulty breathing. He woke up having some trouble. Once up he no longer had any difficulty but resented to the ER. He again underwent a CT scan which showed more extensive edema of the tissues of the submandibular and submental area extending into the retropharyngeal area. There is a small amount of fluid collection in the retropharynx. The airway also had some swelling along the larynx. He has maintained his airway entire time he's been in the hospital with no progression and no stridor. He no longer has the feeling of not being able to breathe. He is able to swallow.  Past Medical History:  Diagnosis Date  . Cancer of prostate ([place])   . Dementia   . Diverticulosis   . DIVERTICULOSIS, COLON 07/25/2007   Qualifier: Diagnosis of  By: [name], LPN, [name]   . Hypertension     Past Surgical History:  Procedure Laterality Date  . penile pump implant     patient denies  . PROSTATECTOMY     seed implant    Family History  Problem Relation Age of Onset  . Early death Father     cirrhosis  . Alcoholism Father   . Cirrhosis    . Breast cancer Sister     Social History:  reports that he quit smoking about 28 years ago. He has never used smokeless tobacco. He reports that he does not drink alcohol or use drugs.  Allergies: No Known Allergies  Medications: I have reviewed the patient's current medications.  Results for orders placed or performed during the hospital encounter of 02/08/17 (from the  past 48 hour(s))  Comprehensive metabolic panel     Status: Abnormal   Collection Time: 02/08/17 11:01 AM  Result Value Ref Range   Sodium 140 135 - 145 mmol/L   Potassium 3.7 3.5 - 5.1 mmol/L   Chloride 109 101 - 111 mmol/L   CO2 23 22 - 32 mmol/L   Glucose, Bld 98 65 - 99 mg/dL   BUN 10 6 - 20 mg/dL   Creatinine, Ser 1.29 (H) 0.61 - 1.24 mg/dL   Calcium 8.7 (L) 8.9 - 10.3 mg/dL   Total Protein 6.6 6.5 - 8.1 g/dL   Albumin 3.4 (L) 3.5 - 5.0 g/dL   AST 21 15 - 41 U/L   ALT 24 17 - 63 U/L   Alkaline Phosphatase 63 38 - 126 U/L   Total Bilirubin 0.9 0.3 - 1.2 mg/dL   GFR calc non Af Amer 51 (L) >60 mL/min   GFR calc Af Amer 60 (L) >60 mL/min    Comment: (NOTE) The eGFR has been calculated using the CKD EPI equation. This calculation has not been validated in all clinical situations. eGFR's persistently <60 mL/min signify possible Chronic Kidney Disease.    Anion gap 8 5 - 15  CBC with Differential     Status: Abnormal   Collection Time: 02/08/17 11:01 AM  Result Value Ref Range   WBC 11.8 (H) 4.0 - 10.5 K/uL  RBC 4.11 (L) 4.22 - 5.81 MIL/uL   Hemoglobin 12.1 (L) 13.0 - 17.0 g/dL   HCT 37.0 (L) 39.0 - 52.0 %   MCV 90.0 78.0 - 100.0 fL   MCH 29.4 26.0 - 34.0 pg   MCHC 32.7 30.0 - 36.0 g/dL   RDW 14.1 11.5 - 15.5 %   Platelets 167 150 - 400 K/uL   Neutrophils Relative % 75 %   Neutro Abs 8.9 (H) 1.7 - 7.7 K/uL   Lymphocytes Relative 19 %   Lymphs Abs 2.2 0.7 - 4.0 K/uL   Monocytes Relative 5 %   Monocytes Absolute 0.6 0.1 - 1.0 K/uL   Eosinophils Relative 1 %   Eosinophils Absolute 0.1 0.0 - 0.7 K/uL   Basophils Relative 0 %   Basophils Absolute 0.0 0.0 - 0.1 K/uL  Urinalysis, Routine w reflex microscopic     Status: Abnormal   Collection Time: 02/08/17 11:27 AM  Result Value Ref Range   Color, Urine YELLOW YELLOW   APPearance CLEAR CLEAR   Specific Gravity, Urine >1.030 (H) 1.005 - 1.030   pH 5.0 5.0 - 8.0   Glucose, UA NEGATIVE NEGATIVE mg/dL   Hgb urine  dipstick MODERATE (A) NEGATIVE   Bilirubin Urine NEGATIVE NEGATIVE   Ketones, ur NEGATIVE NEGATIVE mg/dL   Protein, ur NEGATIVE NEGATIVE mg/dL   Nitrite NEGATIVE NEGATIVE   Leukocytes, UA NEGATIVE NEGATIVE  Urinalysis, Microscopic (reflex)     Status: Abnormal   Collection Time: 02/08/17 11:27 AM  Result Value Ref Range   RBC / HPF 0-5 0 - 5 RBC/hpf   WBC, UA 0-5 0 - 5 WBC/hpf   Bacteria, UA FEW (A) NONE SEEN   Squamous Epithelial / LPF 0-5 (A) NONE SEEN   Mucous PRESENT     Ct Soft Tissue Neck W Contrast  Result Date: 02/08/2017 CLINICAL DATA:  81 y/o M; neck swelling with difficulty swallowing and breathing. EXAM: CT NECK WITH CONTRAST TECHNIQUE: Multidetector CT imaging of the neck was performed using the standard protocol following the bolus administration of intravenous contrast. CONTRAST:  145m ISOVUE-300 IOPAMIDOL (ISOVUE-300) INJECTION 61% COMPARISON:  Concurrent CT of the chest.  CT neck dated 02/07/2017. FINDINGS: Pharynx and larynx: Interval development of severe swelling of the right sided of oropharyngeal mucosa extending into right lateral and posterior walls of the hypopharynx and larynx with essentially complete effacement of the glottis. Additionally there is a small retropharyngeal fluid collection which extends from the C1 -C6 levels. There is diffuse edema in the right face subcutaneous fat, bilateral submandibular spaces, right parotid space, and right-sided parapharyngeal fat. Subcutaneous edema extends into the anterior neck and right supraclavicular fossa. Salivary glands: Persistent swelling and enhancement of the right parotid gland with additional swelling of right submandibular greater than left submandibular glands progressed from prior study. Thyroid: Subcentimeter nodules. Lymph nodes: Reactive upper cervical prominent lymph nodes. Vascular: Atherosclerosis of bilateral carotid bifurcations with mild stenosis on the left. Limited intracranial: Negative. Visualized  orbits: Negative. Mastoids and visualized paranasal sinuses: Clear. Skeleton: Cervical spondylosis with predominantly discogenic degenerative changes. No high-grade bony canal stenosis. Upper chest: 12 mm spiculated nodule in the right upper lobe of the lung. Mild emphysema. Other: None. IMPRESSION: 1. Interval development of severe swelling of right-sided oropharyngeal mucosa extending into the right lateral and posterior walls of the hypopharynx and larynx with essentially complete effacement of the glottic airway. Findings are compatible with severe acute pharyngitis. 2. Small retropharyngeal fluid collection. 3. Diffuse edema in subcutaneous fat  of right face and anterior neck extending into submandibular spaces, right parotid space, and right parapharyngeal fat. 4. Persistent swelling of the right parotid gland and additional swelling of right greater than left submandibular glands which may be reactive or represent acute sialadenitis. 5. No discrete peripherally enhancing organized abscess identified. 6. Please refer to concurrent CT of the chest for evaluation of lungs and mediastinum. These results were called by telephone at the time of interpretation on 02/08/2017 at 2:09 pm to Dr. Isla Pence , who verbally acknowledged these results. Electronically Signed   By: Kristine Garbe M.D.   On: 02/08/2017 14:11   Ct Soft Tissue Neck W Contrast  Result Date: 02/07/2017 CLINICAL DATA:  Right-sided facial swelling below jaw. EXAM: CT NECK WITH CONTRAST TECHNIQUE: Multidetector CT imaging of the neck was performed using the standard protocol following the bolus administration of intravenous contrast. CONTRAST:  34m ISOVUE-300 IOPAMIDOL (ISOVUE-300) INJECTION 61% COMPARISON:  None. FINDINGS: Pharynx and larynx: Mild edema in the right parapharyngeal space related to parotid swelling. Airway slightly displaced to the left. No airway compromise. No pharyngeal mass lesion. Salivary glands: Marked  enlargement of the right parotid gland which shows diffuse edema. There is also extensive edema in the soft tissues around the right parotid gland extending into the subcutaneous fat and skin region. No abscess. No parotid mass or stone. Mass extends into the right parapharyngeal space. Edema also extends towards the right submandibular gland which also appears slightly enlarged. Left submandibular and parotid glands normal. Thyroid: Negative Lymph nodes: No pathologic lymph nodes. Vascular: Right jugular vein is compressed due to edema in the parotid gland and adjacent soft tissues. There is no intraluminal thrombus. There is carotid artery calcification bilaterally. Limited intracranial: Negative Visualized orbits: Negative Mastoids and visualized paranasal sinuses: Negative Skeleton: Cervical degenerative change. No dental abscess. No acute skeletal abnormality. Upper chest: Right upper lobe spiculated nodule measuring 12 mm, suspicious for neoplasm. This is incompletely evaluated on the current study. Other: None IMPRESSION: Right parotid gland enlargement and surrounding edema most compatible with acute infection. No abscess or mass. Edema extends into the right parapharyngeal space and around the right submandibular gland. No airway compromise. The right jugular vein is compressed and may be occluded proximally but there is no thrombus present. Right upper lobe spiculated mass 12 mm. This is concerning for lung carcinoma. CT chest with contrast suggest for further evaluation. **An incidental finding of potential clinical significance has been found. Right upper lobe mass** Electronically Signed   By: CFranchot GalloM.D.   On: 02/07/2017 15:14   Ct Chest W Contrast  Result Date: 02/08/2017 CLINICAL DATA:  Chest mass seen on neck CT. EXAM: CT CHEST WITH CONTRAST TECHNIQUE: Multidetector CT imaging of the chest was performed during intravenous contrast administration. CONTRAST:  1071mISOVUE-300 IOPAMIDOL  (ISOVUE-300) INJECTION 61% COMPARISON:  None available FINDINGS: Cardiovascular: No cardiomegaly or pericardial effusion. Mild to moderate aortic valvular calcification. Atherosclerosis, including the coronary arteries. Mediastinum/Nodes: Negative for inflammation or adenopathy. Lungs/Pleura: Spiculated mixed density nodule in the right upper lobe measuring 15 mm. This is suspicious for bronchogenic carcinoma, especially adenocarcinoma. Pleural tag suspected on the coronal reformats. There is also a 14 mm solid nodule in the lateral costophrenic sulcus on the right (measured coronally). Subpleural nodules are likely lymph nodes, and will be followed. Mild centrilobular emphysema on reformats. Upper Abdomen: No acute finding Musculoskeletal: Spondylosis.  No acute or aggressive finding. IMPRESSION: 1. Transspatial neck infection with supraglottic laryngeal edema is described on  dedicated neck CT. No extension into the mediastinum. Recommend aggressive treatment. 2. 15 mm mixed density nodule in the right upper lobe is suspicious for bronchogenic carcinoma -especially adenocarcinoma. After convalescence PET-CT is recommended. 3. 14 x 7 mm solid nodule in the right lower lobe. 4. Mild centrilobular emphysema. Electronically Signed   By: Monte Fantasia M.D.   On: 02/08/2017 14:11    Review of Systems  Constitutional: Negative.   HENT: Negative.   Eyes: Negative.   Respiratory: Negative.   Cardiovascular: Negative.   Skin: Negative.    Blood pressure (!) 142/72, pulse 80, temperature 99 F (37.2 C), temperature source Oral, resp. rate 16, height _0  (1.753 m), SpO2 98 %. Physical Exam  Constitutional: He appears well-developed and well-nourished.  HENT:  Awake and alert. He is not having any stridor. His voice almost sounds normal. Nose is clear. Oral cavity/oropharynx-is no obvious swelling the uvula is midline and normal appearing. The floor mouth is without swelling. Fiberoptic exam-passing the  scope through the nose and nasopharynx looks clear. The posterior pharyngeal wall has no obvious bulging. There is swelling along the right lateral wall which is fairly minimal as the diameter of the airway is essentially normal down to the epiglottis. The epiglottis looks normal. There is swelling of the right aryepiglottic fold that the swelling disc fold over the glottis viewing from above. When he pronates the cords are visible and they do not look abnormal. The right piriform sinus is a bit swollen as well. The neck has some swelling that is all soft. The parotid gland on the right side is firm and tender.  Neck: Normal range of motion. Neck supple.  Cardiovascular: Normal rate.   Respiratory: Effort normal.  GI: Soft.    Assessment/Plan: Right parotid sialadenitis with extensive swelling-this needs to be treated with intravenous antibiotics and he is already was started on Zosyn  which I would agree with. He also may benefit from a few doses of Solu-Medrol. He should be watched until he is feeling well but right now he doesn't seem to have any compromise of his airway with no stridor. He seems to be able to swallow but he can begin a diet tomorrow as long as he's continuing to progress.  Melissa Montane 02/08/2017, 6:52 PM

## 2017-02-08 NOTE — Progress Notes (Signed)
Pharmacy Antibiotic Note  Jerry [name] is a 81 y.o. male admitted on 02/08/2017 with cellulitis.  Pharmacy has been consulted for zosyn dosing.  Plan: Zosyn 3.375g IV q8h (4 hour infusion).  Monitor culture data, renal function and clinical course  Height: '5\' 9"'$  (175.3 cm) IBW/kg (Calculated) : 70.7  Temp (24hrs), Avg:99.2 F (37.3 C), Min:99 F (37.2 C), Max:99.4 F (37.4 C)   Recent Labs Lab 02/07/17 1405 02/08/17 1101  WBC  --  11.8*  CREATININE 1.30* 1.29*    CrCl cannot be calculated (Unknown ideal weight.).    No Known Allergies   Andrey Cota. Diona Foley, PharmD, BCPS Clinical Pharmacist [phone] 02/08/2017 5:24 PM

## 2017-02-08 NOTE — Progress Notes (Signed)
Arrived from ED at this time. Wife at bedside, oriented to room and surroundings.

## 2017-02-08 NOTE — ED Triage Notes (Signed)
Pt from home with c/o change in infection around his throat.  Pt was seen yesterday for facial/throat swelling and sent home with ABX.  Pt was advised the infection pocket may move but pt began having difficulty with breathing starting last night unless he holds his head back farther than normal.  NAD, A&O.

## 2017-02-08 NOTE — ED Notes (Signed)
Patient transported to CT 

## 2017-02-09 ENCOUNTER — Encounter (HOSPITAL_COMMUNITY): Payer: Self-pay | Admitting: General Practice

## 2017-02-09 ENCOUNTER — Encounter (HOSPITAL_COMMUNITY): Payer: Medicare Other

## 2017-02-09 ENCOUNTER — Inpatient Hospital Stay (HOSPITAL_COMMUNITY): Payer: Medicare Other

## 2017-02-09 DIAGNOSIS — J439 Emphysema, unspecified: Secondary | ICD-10-CM

## 2017-02-09 DIAGNOSIS — E784 Other hyperlipidemia: Secondary | ICD-10-CM

## 2017-02-09 DIAGNOSIS — N183 Chronic kidney disease, stage 3 (moderate): Secondary | ICD-10-CM

## 2017-02-09 DIAGNOSIS — I10 Essential (primary) hypertension: Secondary | ICD-10-CM

## 2017-02-09 DIAGNOSIS — F0390 Unspecified dementia without behavioral disturbance: Secondary | ICD-10-CM

## 2017-02-09 DIAGNOSIS — R220 Localized swelling, mass and lump, head: Secondary | ICD-10-CM

## 2017-02-09 DIAGNOSIS — R918 Other nonspecific abnormal finding of lung field: Secondary | ICD-10-CM

## 2017-02-09 LAB — COMPREHENSIVE METABOLIC PANEL
ALK PHOS: 61 U/L (ref 38–126)
ALT: 23 U/L (ref 17–63)
AST: 20 U/L (ref 15–41)
Albumin: 3.3 g/dL — ABNORMAL LOW (ref 3.5–5.0)
Anion gap: 7 (ref 5–15)
BILIRUBIN TOTAL: 1.2 mg/dL (ref 0.3–1.2)
BUN: 15 mg/dL (ref 6–20)
CALCIUM: 8.6 mg/dL — AB (ref 8.9–10.3)
CO2: 23 mmol/L (ref 22–32)
Chloride: 110 mmol/L (ref 101–111)
Creatinine, Ser: 1.49 mg/dL — ABNORMAL HIGH (ref 0.61–1.24)
GFR calc Af Amer: 50 mL/min — ABNORMAL LOW (ref >60)
GFR, EST NON AFRICAN AMERICAN: 43 mL/min — AB (ref >60)
Glucose, Bld: 161 mg/dL — ABNORMAL HIGH (ref 65–99)
Potassium: 4.1 mmol/L (ref 3.5–5.1)
Sodium: 140 mmol/L (ref 135–145)
TOTAL PROTEIN: 7.2 g/dL (ref 6.5–8.1)

## 2017-02-09 LAB — CBC
HEMATOCRIT: 37.2 % — AB (ref 39.0–52.0)
HEMOGLOBIN: 12 g/dL — AB (ref 13.0–17.0)
MCH: 28.8 pg (ref 26.0–34.0)
MCHC: 32.3 g/dL (ref 30.0–36.0)
MCV: 89.2 fL (ref 78.0–100.0)
Platelets: 185 10*3/uL (ref 150–400)
RBC: 4.17 MIL/uL — ABNORMAL LOW (ref 4.22–5.81)
RDW: 13.7 % (ref 11.5–15.5)
WBC: 8.7 10*3/uL (ref 4.0–10.5)

## 2017-02-09 LAB — BLOOD CULTURE ID PANEL (REFLEXED)
Acinetobacter baumannii: NOT DETECTED
Candida albicans: NOT DETECTED
Candida glabrata: NOT DETECTED
Candida krusei: NOT DETECTED
Candida parapsilosis: NOT DETECTED
Candida tropicalis: NOT DETECTED
ENTEROBACTER CLOACAE COMPLEX: NOT DETECTED
ENTEROCOCCUS SPECIES: NOT DETECTED
Enterobacteriaceae species: NOT DETECTED
Escherichia coli: NOT DETECTED
HAEMOPHILUS INFLUENZAE: NOT DETECTED
Klebsiella oxytoca: NOT DETECTED
Klebsiella pneumoniae: NOT DETECTED
LISTERIA MONOCYTOGENES: NOT DETECTED
METHICILLIN RESISTANCE: DETECTED — AB
NEISSERIA MENINGITIDIS: NOT DETECTED
PROTEUS SPECIES: NOT DETECTED
Pseudomonas aeruginosa: NOT DETECTED
SERRATIA MARCESCENS: NOT DETECTED
STAPHYLOCOCCUS AUREUS BCID: NOT DETECTED
STAPHYLOCOCCUS SPECIES: DETECTED — AB
STREPTOCOCCUS AGALACTIAE: NOT DETECTED
STREPTOCOCCUS SPECIES: NOT DETECTED
Streptococcus pneumoniae: NOT DETECTED
Streptococcus pyogenes: NOT DETECTED

## 2017-02-09 LAB — PROTIME-INR
INR: 1.17
Prothrombin Time: 15 seconds (ref 11.4–15.2)

## 2017-02-09 LAB — APTT: aPTT: 31 seconds (ref 24–36)

## 2017-02-09 LAB — GLUCOSE, CAPILLARY: Glucose-Capillary: 141 mg/dL — ABNORMAL HIGH (ref 65–99)

## 2017-02-09 MED ORDER — METHYLPREDNISOLONE SODIUM SUCC 125 MG IJ SOLR
60.0000 mg | Freq: Four times a day (QID) | INTRAMUSCULAR | Status: DC
  Administered 2017-02-09 – 2017-02-11 (×9): 60 mg via INTRAVENOUS
  Filled 2017-02-09 (×9): qty 2

## 2017-02-09 MED ORDER — IOPAMIDOL (ISOVUE-300) INJECTION 61%
INTRAVENOUS | Status: AC
  Filled 2017-02-09: qty 100

## 2017-02-09 MED ORDER — DONEPEZIL HCL 5 MG PO TABS
5.0000 mg | ORAL_TABLET | Freq: Every morning | ORAL | Status: DC
  Administered 2017-02-10 – 2017-02-11 (×2): 5 mg via ORAL
  Filled 2017-02-09 (×2): qty 1

## 2017-02-09 MED ORDER — IOPAMIDOL (ISOVUE-300) INJECTION 61%
15.0000 mL | INTRAVENOUS | Status: AC
  Administered 2017-02-09 (×2): 15 mL via ORAL

## 2017-02-09 MED ORDER — SODIUM CHLORIDE 0.9 % IV SOLN
INTRAVENOUS | Status: DC
  Administered 2017-02-09 – 2017-02-10 (×2): via INTRAVENOUS

## 2017-02-09 MED ORDER — METHYLPREDNISOLONE SODIUM SUCC 125 MG IJ SOLR: 60.0000 mg | Freq: Four times a day (QID) | INTRAMUSCULAR | Status: DC

## 2017-02-09 NOTE — Progress Notes (Signed)
[facility]                                                                              Patient Demographics  Jerry [name], is a 81 y.o. male, DOB - 01/01/1936, HGD:[id number]  Admit date - 02/08/2017   Admitting Physician Waldemar Dickens, MD  Outpatient Primary MD for the patient is Garret Reddish, MD  Outpatient specialists:   LOS - 1  days    Chief Complaint  Patient presents with  . Follow-up       Brief summary   Jerry [name] is a 81 y.o. male with medical history significant of prostate ca, diverticulosis, HTN, presenting with painful right sided facial and neck swelling for last 3 days, progressively worsening. Per patient painful on palpation and turning of the neck. Otherwise denied any dysphagia or difficulty breathing. Initially seen in the ED on 02/07/2017 and started on Augmentin after consultation with ENT. On the day of admission, swelling had extended beyond the midline of the chin and increasingly more painful. Patient was admitted and ENT was reconsulted.   Assessment & Plan    Principal problem Acute right parotid Sialdenitis  - CT of the soft tissue neck showed severe swelling of the right-sided oropharyngeal mucosa extending into the right lateral and posterior walls of the hypopharynx and larynx with essentially complete effacement of the glottic airway compatible with severe acute pharyngitis, diffuse edema in the subcutaneous fat of the right face and anterior neck extending into the submandibular spaces, right carotid space and right parapharyngeal fat . No abscess - ENT was consulted. Patient was placed on IV Zosyn and Decadron 10 mg 1.  - Patient was seen by Dr. Janace Hoard, recommended continue Zosyn and IV Solu-Medrol.  - Swelling has significant improved today, no stridor, was started on full liquid diet and advance as tolerated  Active problems  Lung Nodules:  - CT chest showed 15 mm nodule in the right upper lobe, 14 x 7 mm  solid nodule in the right lower lobe. Concerning for malignancy. Discussed in detail with patient and daughter at the bedside. Patient has history of smoking however quit smoking more than 20 years ago. - CT contrast abd/pelvis - Discussed with interventional radiology, recommended outpatient PET CT scan and pulmonology consultation  - discussed with pulmonology, Dr. Corrie Dandy, will follow-up for further management   Acute on CKD: Cr 1.3. Contrasted study on 2/14 and another planned for 2/15 - Creatinine trended up to 1.4 today, placed on IV fluid hydration - IVF bolus  HTN: - contineu amlodipine  HLD: - continue Lipitor  Dementia: mild and at baseline. Family member acts as POA - continue namenda, aricept  Code Status: full  DVT Prophylaxis:  SCD's  Family Communication: Discussed in detail with the patient, all imaging results, lab results explained to the patient and daughter at the bedside    Disposition Plan: Once cleared by ENT and tolerating solid diet  Time Spent in minutes  25 minutes  Procedures:  CT soft tissue neck CT chest  Consultants:   ENT Interventional radiology Pulmonology  Antimicrobials:   IV Zosyn  Medications  Scheduled Meds: . amLODipine  5 mg Oral Daily  . atorvastatin  40 mg Oral Daily  . [START ON 02/10/2017] donepezil  5 mg Oral q morning - 10a  . methylPREDNISolone (SOLU-MEDROL) injection  60 mg Intravenous Q6H  . piperacillin-tazobactam (ZOSYN)  IV  3.375 g Intravenous Q8H  . sodium chloride flush  3 mL Intravenous Q12H   Continuous Infusions: PRN Meds:.sodium chloride, acetaminophen **OR** acetaminophen, ondansetron **OR** ondansetron (ZOFRAN) IV, oxyCODONE, sodium chloride flush   Antibiotics   Anti-infectives    Start     Dose/Rate Route Frequency Ordered Stop   02/08/17 2230  piperacillin-tazobactam (ZOSYN) IVPB 3.375 g     3.375 g 12.5 mL/hr over 240 Minutes Intravenous Every 8 hours 02/08/17 1727     02/08/17 1445   piperacillin-tazobactam (ZOSYN) IVPB 3.375 g     3.375 g 100 mL/hr over 30 Minutes Intravenous  Once 02/08/17 1439 02/08/17 1658   02/08/17 1430  piperacillin-tazobactam (ZOSYN) IVPB 4.5 g  Status:  Discontinued     4.5 g 200 mL/hr over 30 Minutes Intravenous  Once 02/08/17 1421 02/08/17 1438   02/08/17 1030  clindamycin (CLEOCIN) IVPB 900 mg     900 mg 100 mL/hr over 30 Minutes Intravenous  Once 02/08/17 1026 02/08/17 1228        Subjective:   Jerry [name] was seen and examined today. Right submandibular swelling improving, still feels some redness and tightness just under the jaw. Pain controlled. No stridor no fevers or chills this morning however had a low-grade fever overnight. Patient denies dizziness, chest pain, shortness of breath, abdominal pain, N/V/D/C, new weakness, numbess, tingling. No acute events overnight.    Objective:   Vitals:   02/08/17 2211 02/09/17 0533 02/09/17 0947 02/09/17 1016  BP: 132/72 (!) 138/59 (!) 128/100 (!) 146/76  Pulse: 85 63  79  Resp: 17 17    Temp: 99.8 F (37.7 C) 97.9 F (36.6 C)  97.7 F (36.5 C)  TempSrc: Oral Oral  Oral  SpO2: 97% 97%  100%  Height:        Intake/Output Summary (Last 24 hours) at 02/09/17 1056 Last data filed at 02/09/17 0900  Gross per 24 hour  Intake             1100 ml  Output                0 ml  Net             1100 ml     Wt Readings from Last 3 Encounters:  10/20/16 90.3 kg (199 lb)  09/26/16 88.1 kg (194 lb 5 oz)  08/22/16 88.1 kg (194 lb 3.2 oz)     Exam  General: Alert and oriented x 3, NAD  HEENT:  PERRLA, EOMI, Anicteric Sclera, mucous membranes moist. Erythema and swelling on the right in the submandibular region, improving, mildly tender  Neck: Supple, no JVD  Cardiovascular: S1 S2 auscultated, 2/6 SEM. Regular rate and rhythm.  Respiratory: Clear to auscultation bilaterally, no wheezing, rales or rhonchi  Gastrointestinal: Soft, nontender, nondistended, + bowel sounds  Ext:  no cyanosis clubbing or edema  Neuro: AAOx3, Cr N's II- XII. Strength 5/5 upper and lower extremities bilaterally  Skin: No rashes  Psych: Normal affect and demeanor, alert and oriented x3    Data Reviewed:  I have personally reviewed following labs and imaging studies  Micro Results No results found for this or any previous visit (from the past 240  hour(s)).  Radiology Reports Ct Soft Tissue Neck W Contrast  Result Date: 02/08/2017 CLINICAL DATA:  81 y/o M; neck swelling with difficulty swallowing and breathing. EXAM: CT NECK WITH CONTRAST TECHNIQUE: Multidetector CT imaging of the neck was performed using the standard protocol following the bolus administration of intravenous contrast. CONTRAST:  149m ISOVUE-300 IOPAMIDOL (ISOVUE-300) INJECTION 61% COMPARISON:  Concurrent CT of the chest.  CT neck dated 02/07/2017. FINDINGS: Pharynx and larynx: Interval development of severe swelling of the right sided of oropharyngeal mucosa extending into right lateral and posterior walls of the hypopharynx and larynx with essentially complete effacement of the glottis. Additionally there is a small retropharyngeal fluid collection which extends from the C1 -C6 levels. There is diffuse edema in the right face subcutaneous fat, bilateral submandibular spaces, right parotid space, and right-sided parapharyngeal fat. Subcutaneous edema extends into the anterior neck and right supraclavicular fossa. Salivary glands: Persistent swelling and enhancement of the right parotid gland with additional swelling of right submandibular greater than left submandibular glands progressed from prior study. Thyroid: Subcentimeter nodules. Lymph nodes: Reactive upper cervical prominent lymph nodes. Vascular: Atherosclerosis of bilateral carotid bifurcations with mild stenosis on the left. Limited intracranial: Negative. Visualized orbits: Negative. Mastoids and visualized paranasal sinuses: Clear. Skeleton: Cervical spondylosis  with predominantly discogenic degenerative changes. No high-grade bony canal stenosis. Upper chest: 12 mm spiculated nodule in the right upper lobe of the lung. Mild emphysema. Other: None. IMPRESSION: 1. Interval development of severe swelling of right-sided oropharyngeal mucosa extending into the right lateral and posterior walls of the hypopharynx and larynx with essentially complete effacement of the glottic airway. Findings are compatible with severe acute pharyngitis. 2. Small retropharyngeal fluid collection. 3. Diffuse edema in subcutaneous fat of right face and anterior neck extending into submandibular spaces, right parotid space, and right parapharyngeal fat. 4. Persistent swelling of the right parotid gland and additional swelling of right greater than left submandibular glands which may be reactive or represent acute sialadenitis. 5. No discrete peripherally enhancing organized abscess identified. 6. Please refer to concurrent CT of the chest for evaluation of lungs and mediastinum. These results were called by telephone at the time of interpretation on 02/08/2017 at 2:09 pm to Dr. JIsla Pence, who verbally acknowledged these results. Electronically Signed   By: LKristine GarbeM.D.   On: 02/08/2017 14:11   Ct Soft Tissue Neck W Contrast  Result Date: 02/07/2017 CLINICAL DATA:  Right-sided facial swelling below jaw. EXAM: CT NECK WITH CONTRAST TECHNIQUE: Multidetector CT imaging of the neck was performed using the standard protocol following the bolus administration of intravenous contrast. CONTRAST:  726mISOVUE-300 IOPAMIDOL (ISOVUE-300) INJECTION 61% COMPARISON:  None. FINDINGS: Pharynx and larynx: Mild edema in the right parapharyngeal space related to parotid swelling. Airway slightly displaced to the left. No airway compromise. No pharyngeal mass lesion. Salivary glands: Marked enlargement of the right parotid gland which shows diffuse edema. There is also extensive edema in the  soft tissues around the right parotid gland extending into the subcutaneous fat and skin region. No abscess. No parotid mass or stone. Mass extends into the right parapharyngeal space. Edema also extends towards the right submandibular gland which also appears slightly enlarged. Left submandibular and parotid glands normal. Thyroid: Negative Lymph nodes: No pathologic lymph nodes. Vascular: Right jugular vein is compressed due to edema in the parotid gland and adjacent soft tissues. There is no intraluminal thrombus. There is carotid artery calcification bilaterally. Limited intracranial: Negative Visualized orbits: Negative Mastoids and visualized  paranasal sinuses: Negative Skeleton: Cervical degenerative change. No dental abscess. No acute skeletal abnormality. Upper chest: Right upper lobe spiculated nodule measuring 12 mm, suspicious for neoplasm. This is incompletely evaluated on the current study. Other: None IMPRESSION: Right parotid gland enlargement and surrounding edema most compatible with acute infection. No abscess or mass. Edema extends into the right parapharyngeal space and around the right submandibular gland. No airway compromise. The right jugular vein is compressed and may be occluded proximally but there is no thrombus present. Right upper lobe spiculated mass 12 mm. This is concerning for lung carcinoma. CT chest with contrast suggest for further evaluation. **An incidental finding of potential clinical significance has been found. Right upper lobe mass** Electronically Signed   By: Franchot Gallo M.D.   On: 02/07/2017 15:14   Ct Chest W Contrast  Result Date: 02/08/2017 CLINICAL DATA:  Chest mass seen on neck CT. EXAM: CT CHEST WITH CONTRAST TECHNIQUE: Multidetector CT imaging of the chest was performed during intravenous contrast administration. CONTRAST:  133m ISOVUE-300 IOPAMIDOL (ISOVUE-300) INJECTION 61% COMPARISON:  None available FINDINGS: Cardiovascular: No cardiomegaly or  pericardial effusion. Mild to moderate aortic valvular calcification. Atherosclerosis, including the coronary arteries. Mediastinum/Nodes: Negative for inflammation or adenopathy. Lungs/Pleura: Spiculated mixed density nodule in the right upper lobe measuring 15 mm. This is suspicious for bronchogenic carcinoma, especially adenocarcinoma. Pleural tag suspected on the coronal reformats. There is also a 14 mm solid nodule in the lateral costophrenic sulcus on the right (measured coronally). Subpleural nodules are likely lymph nodes, and will be followed. Mild centrilobular emphysema on reformats. Upper Abdomen: No acute finding Musculoskeletal: Spondylosis.  No acute or aggressive finding. IMPRESSION: 1. Transspatial neck infection with supraglottic laryngeal edema is described on dedicated neck CT. No extension into the mediastinum. Recommend aggressive treatment. 2. 15 mm mixed density nodule in the right upper lobe is suspicious for bronchogenic carcinoma -especially adenocarcinoma. After convalescence PET-CT is recommended. 3. 14 x 7 mm solid nodule in the right lower lobe. 4. Mild centrilobular emphysema. Electronically Signed   By: JMonte FantasiaM.D.   On: 02/08/2017 14:11    Lab Data:  CBC:  Recent Labs Lab 02/07/17 1405 02/08/17 1101 02/09/17 0255  WBC  --  11.8* 8.7  NEUTROABS  --  8.9*  --   HGB 14.6 12.1* 12.0*  HCT 43.0 37.0* 37.2*  MCV  --  90.0 89.2  PLT  --  167 1027  Basic Metabolic Panel:  Recent Labs Lab 02/07/17 1405 02/08/17 1101 02/09/17 0255  NA 144 140 140  K 4.1 3.7 4.1  CL 106 109 110  CO2  --  23 23  GLUCOSE 80 98 161*  BUN '13 10 15  '$ CREATININE 1.30* 1.29* 1.49*  CALCIUM  --  8.7* 8.6*   GFR: CrCl cannot be calculated (Unknown ideal weight.). Liver Function Tests:  Recent Labs Lab 02/08/17 1101 02/09/17 0255  AST 21 20  ALT 24 23  ALKPHOS 63 61  BILITOT 0.9 1.2  PROT 6.6 7.2  ALBUMIN 3.4* 3.3*   No results for input(s): LIPASE, AMYLASE in  the last 168 hours. No results for input(s): AMMONIA in the last 168 hours. Coagulation Profile:  Recent Labs Lab 02/09/17 0255  INR 1.17   Cardiac Enzymes: No results for input(s): CKTOTAL, CKMB, CKMBINDEX, TROPONINI in the last 168 hours. BNP (last 3 results) No results for input(s): PROBNP in the last 8760 hours. HbA1C: No results for input(s): HGBA1C in the last 72 hours. CBG: No  results for input(s): GLUCAP in the last 168 hours. Lipid Profile: No results for input(s): CHOL, HDL, LDLCALC, TRIG, CHOLHDL, LDLDIRECT in the last 72 hours. Thyroid Function Tests: No results for input(s): TSH, T4TOTAL, FREET4, T3FREE, THYROIDAB in the last 72 hours. Anemia Panel: No results for input(s): VITAMINB12, FOLATE, FERRITIN, TIBC, IRON, RETICCTPCT in the last 72 hours. Urine analysis:    Component Value Date/Time   COLORURINE YELLOW 02/08/2017 1127   APPEARANCEUR CLEAR 02/08/2017 1127   LABSPEC >1.030 (H) 02/08/2017 1127   PHURINE 5.0 02/08/2017 1127   GLUCOSEU NEGATIVE 02/08/2017 1127   HGBUR MODERATE (A) 02/08/2017 1127   BILIRUBINUR NEGATIVE 02/08/2017 1127   BILIRUBINUR n 08/01/2016 1147   KETONESUR NEGATIVE 02/08/2017 1127   PROTEINUR NEGATIVE 02/08/2017 1127   UROBILINOGEN 0.2 08/01/2016 1147   NITRITE NEGATIVE 02/08/2017 1127   LEUKOCYTESUR NEGATIVE 02/08/2017 Stryker M.D. [facility] 02/09/2017, 10:57 AM  Pager: [phone] Between 7am to 7pm - call Pager - [phone]  After 7pm go to [email or web address] - password [email or web address]  Call night coverage person covering after 7pm

## 2017-02-09 NOTE — Progress Notes (Signed)
IR received request for biopsy of RUL lung nodule. Pt admitted with sialadenitis and has been placed on IV abx Incidental findings of right lung nodules on CT.  Imaging reviewed by Dr. Kathlene Cote. 18m RUL nodule is mixed density/semi-solid. Smaller RLL.   Recommended course of work up would be outpt PET scan, followed by multi-disciplinary consultation/discussion.  Also favor allowing pt to recover from acute infectious sialadenitis.  KAscencion DikePA-C Interventional Radiology 02/09/2017 9:42 AM

## 2017-02-09 NOTE — Consult Note (Addendum)
Name: Jerry [name] MRN: [id number] DOB: 07/21/1936    ADMISSION DATE:  02/08/2017 CONSULTATION DATE:  02/09/2017  REFERRING MD :  Dr. Tana Coast   CHIEF COMPLAINT: Lung nodules    HISTORY OF PRESENT ILLNESS:  81 year old male with PMH of CKD stage III, prostate cancer (docmented however, family states all they now is that he had polyps on prostate), dementia, HTN, and diverticulosis. Initially seen 2/13 in ED for two day history of facial/neck edema and sent home with Augmentin regimen and ENT appointment. On 2/14 swelling progressively got worse with noted dyspnea/neck pain and came back to ED. Patient started on Zosyn and decadron for acute right parotid sialadenitis. CT neck/chest revealed 15 mm mixed density nodule in the right upper lobe and 14x7 solid nodule in the right lower lobe concerning for malignancy. PCCM was asked to consult.   SIGNIFICANT EVENTS  2/14 > Presents to ED with 3 day history neck/faical edema   STUDIES:  2/13 CT Neck > Right parotid gland enlargement and surrounding edema most compatiable with acute infection, no abscess or mass. Edema extends into the right parapharyngeal space and around the right submandibular gland. Right upper lobe spiculated mass 12 mm, concerning for lung carcinoma  2/14 CT Chest > Trans spatial neck infection with supraglottic laryngeal edema is described on dedicated neck CT. 15 mm mixed density nodule in the right upper lobe is suspicious for bronchogenic carcinoma, 14 x 7 solid nodule in the right lower lobe, mild emphysema    PAST MEDICAL HISTORY :   has a past medical history of Cancer of prostate ([place]) (2001); Chronic kidney disease (CKD), stage III (moderate); Dementia; DIVERTICULOSIS, COLON (07/25/2007); High cholesterol; and Hypertension.  has a past surgical history that includes Insertion prostate radiation seed (07/2000); Penile prosthesis implant (02/2002); Removal of penile prosthesis (06/2003); Penile prosthesis placement  (06/2003); Scrotal surgery (06/2003); Refractive surgery (Bilateral); and Eye surgery. Prior to Admission medications   Medication Sig Start Date End Date Taking? Authorizing Provider  amLODipine-olmesartan (AZOR) 5-40 MG tablet Take 1 tablet by mouth daily. 08/08/16  Yes Marin Olp, MD  amoxicillin-clavulanate (AUGMENTIN) 875-125 MG tablet Take 1 tablet by mouth 2 (two) times daily. 02/07/17 02/17/17 Yes Deno Etienne, DO  atorvastatin (LIPITOR) 40 MG tablet Take 1 tablet (40 mg total) by mouth daily. 08/08/16 08/08/17 Yes Marin Olp, MD  donepezil (ARICEPT) 5 MG tablet Take once a day Patient taking differently: Take 5 mg by mouth at bedtime. Take once a day 08/08/16  Yes Marin Olp, MD  memantine (NAMENDA) 10 MG tablet TAKE 1 TABLET BY MOUTH DAILY FOR 1 WEEK,THEN INCREASE TO 1 TABLET TWICE DAILY AND CONTINUE Patient not taking: Reported on 02/08/2017 09/27/16   Cameron Sprang, MD   No Known Allergies  FAMILY HISTORY:  family history includes Alcoholism in his father; Breast cancer in his sister; Early death in his father. SOCIAL HISTORY:  reports that he quit smoking about 28 years ago. He smoked 0.50 packs per day. He has never used smokeless tobacco. He reports that he does not drink alcohol or use drugs.  REVIEW OF SYSTEMS:   All negative; except for those that are bolded, which indicate positives.  Constitutional: weight loss, weight gain, night sweats, fevers, chills, fatigue, weakness.  HEENT: headaches, sore throat, sneezing, nasal congestion, post nasal drip, difficulty swallowing, tooth/dental problems, visual complaints, visual changes, ear aches. Neuro: difficulty with speech, weakness, numbness, ataxia. CV:  chest pain, orthopnea, PND, swelling in lower  extremities, dizziness, palpitations, syncope.  Resp: cough, hemoptysis, dyspnea, wheezing. GI: heartburn, indigestion, abdominal pain, nausea, vomiting, diarrhea, constipation, change in bowel habits, loss of appetite,  hematemesis, melena, hematochezia.  GU: dysuria, change in color of urine, urgency or frequency, flank pain, hematuria. MSK: joint pain or swelling, decreased range of motion. Psych: change in mood or affect, depression, anxiety, suicidal ideations, homicidal ideations. Skin: rash, itching, bruising.   SUBJECTIVE:  Currently walking in room. Reports that swelling of face/neck is going done since admission.   VITAL SIGNS: Temp:  [97.7 F (36.5 C)-99.8 F (37.7 C)] 97.7 F (36.5 C) (02/15 1016) Pulse Rate:  [62-85] 79 (02/15 1016) Resp:  [16-17] 17 (02/15 0533) BP: (123-146)/(59-100) 146/76 (02/15 1016) SpO2:  [97 %-100 %] 100 % (02/15 1016)  PHYSICAL EXAMINATION: General: Adult male, no distress, walking in room   Neuro: Alert, oriented x 3, moves all extremities, follows commands  HEENT: slight facial/neck edema noted Cardiovascular: RRR, no MRG, NI s1/s2 Lungs: clear breath sounds, non-labored  Abdomen: non-tender, non-distended, active bowel sounds  Musculoskeletal: no acute  Skin: warm, dry, intact    Recent Labs Lab 02/07/17 1405 02/08/17 1101 02/09/17 0255  NA 144 140 140  K 4.1 3.7 4.1  CL 106 109 110  CO2  --  23 23  BUN '13 10 15  '$ CREATININE 1.30* 1.29* 1.49*  GLUCOSE 80 98 161*    Recent Labs Lab 02/07/17 1405 02/08/17 1101 02/09/17 0255  HGB 14.6 12.1* 12.0*  HCT 43.0 37.0* 37.2*  WBC  --  11.8* 8.7  PLT  --  167 185   Ct Soft Tissue Neck W Contrast  Result Date: 02/08/2017 CLINICAL DATA:  81 y/o M; neck swelling with difficulty swallowing and breathing. EXAM: CT NECK WITH CONTRAST TECHNIQUE: Multidetector CT imaging of the neck was performed using the standard protocol following the bolus administration of intravenous contrast. CONTRAST:  118m ISOVUE-300 IOPAMIDOL (ISOVUE-300) INJECTION 61% COMPARISON:  Concurrent CT of the chest.  CT neck dated 02/07/2017. FINDINGS: Pharynx and larynx: Interval development of severe swelling of the right sided of  oropharyngeal mucosa extending into right lateral and posterior walls of the hypopharynx and larynx with essentially complete effacement of the glottis. Additionally there is a small retropharyngeal fluid collection which extends from the C1 -C6 levels. There is diffuse edema in the right face subcutaneous fat, bilateral submandibular spaces, right parotid space, and right-sided parapharyngeal fat. Subcutaneous edema extends into the anterior neck and right supraclavicular fossa. Salivary glands: Persistent swelling and enhancement of the right parotid gland with additional swelling of right submandibular greater than left submandibular glands progressed from prior study. Thyroid: Subcentimeter nodules. Lymph nodes: Reactive upper cervical prominent lymph nodes. Vascular: Atherosclerosis of bilateral carotid bifurcations with mild stenosis on the left. Limited intracranial: Negative. Visualized orbits: Negative. Mastoids and visualized paranasal sinuses: Clear. Skeleton: Cervical spondylosis with predominantly discogenic degenerative changes. No high-grade bony canal stenosis. Upper chest: 12 mm spiculated nodule in the right upper lobe of the lung. Mild emphysema. Other: None. IMPRESSION: 1. Interval development of severe swelling of right-sided oropharyngeal mucosa extending into the right lateral and posterior walls of the hypopharynx and larynx with essentially complete effacement of the glottic airway. Findings are compatible with severe acute pharyngitis. 2. Small retropharyngeal fluid collection. 3. Diffuse edema in subcutaneous fat of right face and anterior neck extending into submandibular spaces, right parotid space, and right parapharyngeal fat. 4. Persistent swelling of the right parotid gland and additional swelling of right greater than  left submandibular glands which may be reactive or represent acute sialadenitis. 5. No discrete peripherally enhancing organized abscess identified. 6. Please refer to  concurrent CT of the chest for evaluation of lungs and mediastinum. These results were called by telephone at the time of interpretation on 02/08/2017 at 2:09 pm to Dr. Isla Pence , who verbally acknowledged these results. Electronically Signed   By: Kristine Garbe M.D.   On: 02/08/2017 14:11   Ct Soft Tissue Neck W Contrast  Result Date: 02/07/2017 CLINICAL DATA:  Right-sided facial swelling below jaw. EXAM: CT NECK WITH CONTRAST TECHNIQUE: Multidetector CT imaging of the neck was performed using the standard protocol following the bolus administration of intravenous contrast. CONTRAST:  58m ISOVUE-300 IOPAMIDOL (ISOVUE-300) INJECTION 61% COMPARISON:  None. FINDINGS: Pharynx and larynx: Mild edema in the right parapharyngeal space related to parotid swelling. Airway slightly displaced to the left. No airway compromise. No pharyngeal mass lesion. Salivary glands: Marked enlargement of the right parotid gland which shows diffuse edema. There is also extensive edema in the soft tissues around the right parotid gland extending into the subcutaneous fat and skin region. No abscess. No parotid mass or stone. Mass extends into the right parapharyngeal space. Edema also extends towards the right submandibular gland which also appears slightly enlarged. Left submandibular and parotid glands normal. Thyroid: Negative Lymph nodes: No pathologic lymph nodes. Vascular: Right jugular vein is compressed due to edema in the parotid gland and adjacent soft tissues. There is no intraluminal thrombus. There is carotid artery calcification bilaterally. Limited intracranial: Negative Visualized orbits: Negative Mastoids and visualized paranasal sinuses: Negative Skeleton: Cervical degenerative change. No dental abscess. No acute skeletal abnormality. Upper chest: Right upper lobe spiculated nodule measuring 12 mm, suspicious for neoplasm. This is incompletely evaluated on the current study. Other: None IMPRESSION:  Right parotid gland enlargement and surrounding edema most compatible with acute infection. No abscess or mass. Edema extends into the right parapharyngeal space and around the right submandibular gland. No airway compromise. The right jugular vein is compressed and may be occluded proximally but there is no thrombus present. Right upper lobe spiculated mass 12 mm. This is concerning for lung carcinoma. CT chest with contrast suggest for further evaluation. **An incidental finding of potential clinical significance has been found. Right upper lobe mass** Electronically Signed   By: CFranchot GalloM.D.   On: 02/07/2017 15:14   Ct Chest W Contrast  Result Date: 02/08/2017 CLINICAL DATA:  Chest mass seen on neck CT. EXAM: CT CHEST WITH CONTRAST TECHNIQUE: Multidetector CT imaging of the chest was performed during intravenous contrast administration. CONTRAST:  1075mISOVUE-300 IOPAMIDOL (ISOVUE-300) INJECTION 61% COMPARISON:  None available FINDINGS: Cardiovascular: No cardiomegaly or pericardial effusion. Mild to moderate aortic valvular calcification. Atherosclerosis, including the coronary arteries. Mediastinum/Nodes: Negative for inflammation or adenopathy. Lungs/Pleura: Spiculated mixed density nodule in the right upper lobe measuring 15 mm. This is suspicious for bronchogenic carcinoma, especially adenocarcinoma. Pleural tag suspected on the coronal reformats. There is also a 14 mm solid nodule in the lateral costophrenic sulcus on the right (measured coronally). Subpleural nodules are likely lymph nodes, and will be followed. Mild centrilobular emphysema on reformats. Upper Abdomen: No acute finding Musculoskeletal: Spondylosis.  No acute or aggressive finding. IMPRESSION: 1. Transspatial neck infection with supraglottic laryngeal edema is described on dedicated neck CT. No extension into the mediastinum. Recommend aggressive treatment. 2. 15 mm mixed density nodule in the right upper lobe is suspicious for  bronchogenic carcinoma -especially adenocarcinoma. After convalescence  PET-CT is recommended. 3. 14 x 7 mm solid nodule in the right lower lobe. 4. Mild centrilobular emphysema. Electronically Signed   By: Monte Fantasia M.D.   On: 02/08/2017 14:11    ASSESSMENT / PLAN:  15 mm RUL nodule - mixed density and 14 x 7 solid RLL nodule secondary to infectious vs malignancy   Smoking history - quiet 30+years ago, positive family history of cancer, at baseline lives alone at home with some memory loss  Plan  -PFT -ECHO -Outpatient PET scan  -Outpatient Follow up appointment with pulmonary   Updated patient and daughter at bedside of plan.   Hayden Pedro, AG-ACNP [facility]  Pgr: [phone]  PCCM Pgr: [phone]    ATTENDING NOTE / ATTESTATION NOTE :   I have discussed the case with the resident/APP  Hayden Pedro NP.   I agree with the resident/APP's  history, physical examination, assessment, and plans.    I have edited the above note and modified it according to our agreed history, physical examination, assessment and plan.   Briefly, 57M with 25PY smoking history, quit in his 21s, not known to have asthma or copd, with strong CA family history,  with PMH of CKD stage III, prostate cancer (docmented however, family states all they now is that he had polyps on prostate), dementia, HTN, and diverticulosis, admitted for facial/neck swelling.   Pt was initially seen 2/13 in ED for two day history of facial/neck edema and sent home with Augmentin regimen and ENT appointment. On 2/14 swelling progressively got worse with noted dyspnea/neck pain and came back to ED. Patient started on Zosyn and decadron for acute right parotid sialadenitis. CT neck/chest revealed 15 mm mixed density nodule in the right upper lobe and 14x7 solid nodule in the right lower lobe concerning for malignancy. PCCM was asked to consult.   Neck swelling is better. He is getting IV abx.    Comfortable, (-) cough. On and off SOB.  Functional at baseline but has mild dementia.   Vitals:  Vitals:   02/08/17 2211 02/09/17 0533 02/09/17 0947 02/09/17 1016  BP: 132/72 (!) 138/59 (!) 128/100 (!) 146/76  Pulse: 85 63  79  Resp: 17 17    Temp: 99.8 F (37.7 C) 97.9 F (36.6 C)  97.7 F (36.5 C)  TempSrc: Oral Oral  Oral  SpO2: 97% 97%  100%  Height:        Constitutional/General: well-nourished, well-developed, comfortable, not in any distress  There is no height or weight on file to calculate BMI. Wt Readings from Last 3 Encounters:  10/20/16 90.3 kg (199 lb)  09/26/16 88.1 kg (194 lb 5 oz)  08/22/16 88.1 kg (194 lb 3.2 oz)    HEENT: PERLA, anicteric sclerae. (-) Oral thrush.  Neck: No masses. Midline trachea. No JVD, Prominent lower jaw with enlarged R lateral neck LN. (-) bruits appreciated.  Respiratory/Chest: Grossly normal chest. (-) deformity. (-) Accessory muscle use.  Symmetric expansion. Diminished BS on both lower lung zones. (-) wheezing, crackles, rhonchi (-) egophony  Cardiovascular: Regular rate and  rhythm, heart sounds normal, no murmur or gallops,  Trace peripheral edema  Gastrointestinal:  Normal bowel sounds. Soft, non-tender. No hepatosplenomegaly.  (-) masses.   Musculoskeletal:  Normal muscle tone.   Extremities: Grossly normal. (-) clubbing, cyanosis.  (-) edema  Skin: (-) rash,lesions seen.   Neurological/Psychiatric : sedated, intubated. CN grossly intact. (-) lateralizing signs.   CBC Recent Labs  02/07/17  1405  02/08/17  1101  02/09/17  0255  WBC   --   11.8*  8.7  HGB  14.6  12.1*  12.0*  HCT  43.0  37.0*  37.2*  PLT   --   167  185    Coag's Recent Labs     02/09/17  0255  APTT  31  INR  1.17    BMET Recent Labs     02/07/17  1405  02/08/17  1101  02/09/17  0255  NA  144  140  140  K  4.1  3.7  4.1  CL  106  109  110  CO2   --   23  23  BUN  '13  10  15  '$ CREATININE  1.30*  1.29*  1.49*  GLUCOSE   80  98  161*    Electrolytes Recent Labs     02/08/17  1101  02/09/17  0255  CALCIUM  8.7*  8.6*    Sepsis Markers No results for input(s): PROCALCITON, O2SATVEN in the last 72 hours.  Invalid input(s): LACTICACIDVEN  ABG No results for input(s): PHART, PCO2ART, PO2ART in the last 72 hours.  Liver Enzymes Recent Labs     02/08/17  1101  02/09/17  0255  AST  21  20  ALT  24  23  ALKPHOS  63  61  BILITOT  0.9  1.2  ALBUMIN  3.4*  3.3*    Cardiac Enzymes No results for input(s): TROPONINI, PROBNP in the last 72 hours.  Glucose No results for input(s): GLUCAP in the last 72 hours.  Imaging Ct Soft Tissue Neck W Contrast  Result Date: 02/08/2017 CLINICAL DATA:  81 y/o M; neck swelling with difficulty swallowing and breathing. EXAM: CT NECK WITH CONTRAST TECHNIQUE: Multidetector CT imaging of the neck was performed using the standard protocol following the bolus administration of intravenous contrast. CONTRAST:  112m ISOVUE-300 IOPAMIDOL (ISOVUE-300) INJECTION 61% COMPARISON:  Concurrent CT of the chest.  CT neck dated 02/07/2017. FINDINGS: Pharynx and larynx: Interval development of severe swelling of the right sided of oropharyngeal mucosa extending into right lateral and posterior walls of the hypopharynx and larynx with essentially complete effacement of the glottis. Additionally there is a small retropharyngeal fluid collection which extends from the C1 -C6 levels. There is diffuse edema in the right face subcutaneous fat, bilateral submandibular spaces, right parotid space, and right-sided parapharyngeal fat. Subcutaneous edema extends into the anterior neck and right supraclavicular fossa. Salivary glands: Persistent swelling and enhancement of the right parotid gland with additional swelling of right submandibular greater than left submandibular glands progressed from prior study. Thyroid: Subcentimeter nodules. Lymph nodes: Reactive upper cervical prominent lymph nodes.  Vascular: Atherosclerosis of bilateral carotid bifurcations with mild stenosis on the left. Limited intracranial: Negative. Visualized orbits: Negative. Mastoids and visualized paranasal sinuses: Clear. Skeleton: Cervical spondylosis with predominantly discogenic degenerative changes. No high-grade bony canal stenosis. Upper chest: 12 mm spiculated nodule in the right upper lobe of the lung. Mild emphysema. Other: None. IMPRESSION: 1. Interval development of severe swelling of right-sided oropharyngeal mucosa extending into the right lateral and posterior walls of the hypopharynx and larynx with essentially complete effacement of the glottic airway. Findings are compatible with severe acute pharyngitis. 2. Small retropharyngeal fluid collection. 3. Diffuse edema in subcutaneous fat of right face and anterior neck extending into submandibular spaces, right parotid space, and right parapharyngeal fat. 4. Persistent swelling of the right parotid gland and additional  swelling of right greater than left submandibular glands which may be reactive or represent acute sialadenitis. 5. No discrete peripherally enhancing organized abscess identified. 6. Please refer to concurrent CT of the chest for evaluation of lungs and mediastinum. These results were called by telephone at the time of interpretation on 02/08/2017 at 2:09 pm to Dr. Isla Pence , who verbally acknowledged these results. Electronically Signed   By: Kristine Garbe M.D.   On: 02/08/2017 14:11   Ct Soft Tissue Neck W Contrast  Result Date: 02/07/2017 CLINICAL DATA:  Right-sided facial swelling below jaw. EXAM: CT NECK WITH CONTRAST TECHNIQUE: Multidetector CT imaging of the neck was performed using the standard protocol following the bolus administration of intravenous contrast. CONTRAST:  28m ISOVUE-300 IOPAMIDOL (ISOVUE-300) INJECTION 61% COMPARISON:  None. FINDINGS: Pharynx and larynx: Mild edema in the right parapharyngeal space related to  parotid swelling. Airway slightly displaced to the left. No airway compromise. No pharyngeal mass lesion. Salivary glands: Marked enlargement of the right parotid gland which shows diffuse edema. There is also extensive edema in the soft tissues around the right parotid gland extending into the subcutaneous fat and skin region. No abscess. No parotid mass or stone. Mass extends into the right parapharyngeal space. Edema also extends towards the right submandibular gland which also appears slightly enlarged. Left submandibular and parotid glands normal. Thyroid: Negative Lymph nodes: No pathologic lymph nodes. Vascular: Right jugular vein is compressed due to edema in the parotid gland and adjacent soft tissues. There is no intraluminal thrombus. There is carotid artery calcification bilaterally. Limited intracranial: Negative Visualized orbits: Negative Mastoids and visualized paranasal sinuses: Negative Skeleton: Cervical degenerative change. No dental abscess. No acute skeletal abnormality. Upper chest: Right upper lobe spiculated nodule measuring 12 mm, suspicious for neoplasm. This is incompletely evaluated on the current study. Other: None IMPRESSION: Right parotid gland enlargement and surrounding edema most compatible with acute infection. No abscess or mass. Edema extends into the right parapharyngeal space and around the right submandibular gland. No airway compromise. The right jugular vein is compressed and may be occluded proximally but there is no thrombus present. Right upper lobe spiculated mass 12 mm. This is concerning for lung carcinoma. CT chest with contrast suggest for further evaluation. **An incidental finding of potential clinical significance has been found. Right upper lobe mass** Electronically Signed   By: CFranchot GalloM.D.   On: 02/07/2017 15:14   Ct Chest W Contrast  Result Date: 02/08/2017 CLINICAL DATA:  Chest mass seen on neck CT. EXAM: CT CHEST WITH CONTRAST TECHNIQUE:  Multidetector CT imaging of the chest was performed during intravenous contrast administration. CONTRAST:  1012mISOVUE-300 IOPAMIDOL (ISOVUE-300) INJECTION 61% COMPARISON:  None available FINDINGS: Cardiovascular: No cardiomegaly or pericardial effusion. Mild to moderate aortic valvular calcification. Atherosclerosis, including the coronary arteries. Mediastinum/Nodes: Negative for inflammation or adenopathy. Lungs/Pleura: Spiculated mixed density nodule in the right upper lobe measuring 15 mm. This is suspicious for bronchogenic carcinoma, especially adenocarcinoma. Pleural tag suspected on the coronal reformats. There is also a 14 mm solid nodule in the lateral costophrenic sulcus on the right (measured coronally). Subpleural nodules are likely lymph nodes, and will be followed. Mild centrilobular emphysema on reformats. Upper Abdomen: No acute finding Musculoskeletal: Spondylosis.  No acute or aggressive finding. IMPRESSION: 1. Transspatial neck infection with supraglottic laryngeal edema is described on dedicated neck CT. No extension into the mediastinum. Recommend aggressive treatment. 2. 15 mm mixed density nodule in the right upper lobe is suspicious for bronchogenic carcinoma -  especially adenocarcinoma. After convalescence PET-CT is recommended. 3. 14 x 7 mm solid nodule in the right lower lobe. 4. Mild centrilobular emphysema. Electronically Signed   By: Monte Fantasia M.D.   On: 02/08/2017 14:11   Assessment/Plan RUL lung nodule, 1.5 cm. Differentials: Malignancy likely with Bronchogenic CA/AdenoCA, Inflammatory condition, Could be related to current infection (R parotid gland) but less likely - I extensively discussed with pt and daughter re: differentials - plan for a PET scan as outpt but I prefer the R parotid gland infection/inflammation  to get better first  as it can affect results of the PET scan - plan for f/u with me or [facility] in 2-3 weeks and schedule the PET scan then -  plan for PFT and 2decho to determine if he can tolerate surgery - It will be difficult to Biopsy the nodule via IR at this point 2/2 location.  It might also be hard to biopsy the nodule via bronchoscopy.  I prefer we get the PET scan first and see if there are other areas we can biopsy.   RLL lung nodule - it looks solid and calcified.  Not sure if this is just post inflammatory or related to RUL nodule - PET scan to better assess activity.   Likely with COPD.  He is not too symptomatic - PFTs - prn albuterol.    Family :Family updated at length today.  Plan d/w daughter and pt.  Plan d/w [facility] attending on 2/15.  Noted plans for d/c on 2/16.  Will facilitate ordering tests.  Needs f/u with pulm in 2-3 weeks.     Monica Becton, MD 02/09/2017, 1:08 PM [facility] Pager [phone] After 3 pm or if no answer, call [phone]

## 2017-02-09 NOTE — Progress Notes (Signed)
PHARMACY - PHYSICIAN COMMUNICATION CRITICAL VALUE ALERT - BLOOD CULTURE IDENTIFICATION (BCID)  Results for orders placed or performed during the hospital encounter of 02/08/17  Blood Culture ID Panel (Reflexed) (Collected: 02/08/2017  6:00 PM)  Result Value Ref Range   Enterococcus species NOT DETECTED NOT DETECTED   Listeria monocytogenes NOT DETECTED NOT DETECTED   Staphylococcus species DETECTED (A) NOT DETECTED   Staphylococcus aureus NOT DETECTED NOT DETECTED   Methicillin resistance DETECTED (A) NOT DETECTED   Streptococcus species NOT DETECTED NOT DETECTED   Streptococcus agalactiae NOT DETECTED NOT DETECTED   Streptococcus pneumoniae NOT DETECTED NOT DETECTED   Streptococcus pyogenes NOT DETECTED NOT DETECTED   Acinetobacter baumannii NOT DETECTED NOT DETECTED   Enterobacteriaceae species NOT DETECTED NOT DETECTED   Enterobacter cloacae complex NOT DETECTED NOT DETECTED   Escherichia coli NOT DETECTED NOT DETECTED   Klebsiella oxytoca NOT DETECTED NOT DETECTED   Klebsiella pneumoniae NOT DETECTED NOT DETECTED   Proteus species NOT DETECTED NOT DETECTED   Serratia marcescens NOT DETECTED NOT DETECTED   Haemophilus influenzae NOT DETECTED NOT DETECTED   Neisseria meningitidis NOT DETECTED NOT DETECTED   Pseudomonas aeruginosa NOT DETECTED NOT DETECTED   Candida albicans NOT DETECTED NOT DETECTED   Candida glabrata NOT DETECTED NOT DETECTED   Candida krusei NOT DETECTED NOT DETECTED   Candida parapsilosis NOT DETECTED NOT DETECTED   Candida tropicalis NOT DETECTED NOT DETECTED    Name of physician (or Provider) Contacted: Dr. Tana Coast  Changes to prescribed antibiotics required: None, likely contaminant  Tad Moore 02/09/2017  4:36 PM

## 2017-02-10 ENCOUNTER — Inpatient Hospital Stay (HOSPITAL_COMMUNITY): Payer: Medicare Other

## 2017-02-10 ENCOUNTER — Encounter (HOSPITAL_COMMUNITY): Payer: Medicare Other

## 2017-02-10 DIAGNOSIS — R0600 Dyspnea, unspecified: Secondary | ICD-10-CM

## 2017-02-10 LAB — ECHOCARDIOGRAM COMPLETE: Height: 69 in

## 2017-02-10 LAB — CULTURE, BLOOD (ROUTINE X 2)

## 2017-02-10 NOTE — Progress Notes (Signed)
[facility]                                                                              Patient Demographics  Jerry [name], is a 81 y.o. male, DOB - 1936-10-17, BOF:[id number]  Admit date - 02/08/2017   Admitting Physician Waldemar Dickens, MD  Outpatient Primary MD for the patient is Garret Reddish, MD  Outpatient specialists:   LOS - 2  days    Chief Complaint  Patient presents with  . Follow-up       Brief summary   Jerry [name] is a 81 y.o. male with medical history significant of prostate ca, diverticulosis, HTN, presenting with painful right sided facial and neck swelling for last 3 days, progressively worsening. Per patient painful on palpation and turning of the neck. Otherwise denied any dysphagia or difficulty breathing. Initially seen in the ED on 02/07/2017 and started on Augmentin after consultation with ENT. On the day of admission, swelling had extended beyond the midline of the chin and increasingly more painful. Patient was admitted and ENT was reconsulted.   Assessment & Plan    Principal problem Acute right parotid Sialdenitis  - CT of the soft tissue neck showed severe swelling of the right-sided oropharyngeal mucosa extending into the right lateral and posterior walls of the hypopharynx and larynx with essentially complete effacement of the glottic airway compatible with severe acute pharyngitis, diffuse edema in the subcutaneous fat of the right face and anterior neck extending into the submandibular spaces, right carotid space and right parapharyngeal fat . No abscess - ENT was consulted. Patient was placed on IV Zosyn and Decadron 10 mg 1.  - Patient was seen by Dr. Janace Hoard, recommended continue Zosyn and IV Solu-Medrol.  - Swelling has significantly improved, Patient tolerating solid soft diet  Active problems  Lung Nodules:  - CT chest showed 15 mm nodule in the right upper lobe, 14 x 7 mm solid nodule in the right lower lobe.  Concerning for malignancy. Discussed in detail with patient and daughter at the bedside. Patient has history of smoking however quit smoking more than 20 years ago. - CT contrast abd/pelvis - Discussed with interventional radiology, recommended outpatient PET CT scan and pulmonology consultation  - Appreciate pulmonology recommendations, 2-D echo, PFTs  Acute on CKD: Cr 1.3. Contrasted study on 2/14 and another planned for 2/15 - Creatinine trended up to 1.4, BMET in a.m.   HTN: - Continue amlodipine  HLD: - continue Lipitor  Dementia: mild and at baseline. Family member acts as POA - continue namenda, aricept  Code Status: full  DVT Prophylaxis:  SCD's  Family Communication: Discussed in detail with the patient, all imaging results, lab results explained to the patient and daughter at the bedside    Disposition Plan: 2-D echo pending, plan DC home in am   Time Spent in minutes  25 minutes  Procedures:  CT soft tissue neck CT chest  Consultants:   ENT Interventional radiology Pulmonology  Antimicrobials:   IV Zosyn   Medications  Scheduled Meds: . amLODipine  5 mg Oral Daily  . atorvastatin  40 mg Oral  Daily  . donepezil  5 mg Oral q morning - 10a  . methylPREDNISolone (SOLU-MEDROL) injection  60 mg Intravenous Q6H  . piperacillin-tazobactam (ZOSYN)  IV  3.375 g Intravenous Q8H  . sodium chloride flush  3 mL Intravenous Q12H   Continuous Infusions: . sodium chloride 100 mL/hr at 02/09/17 1115   PRN Meds:.sodium chloride, acetaminophen **OR** acetaminophen, ondansetron **OR** ondansetron (ZOFRAN) IV, oxyCODONE, sodium chloride flush   Antibiotics   Anti-infectives    Start     Dose/Rate Route Frequency Ordered Stop   02/08/17 2230  piperacillin-tazobactam (ZOSYN) IVPB 3.375 g     3.375 g 12.5 mL/hr over 240 Minutes Intravenous Every 8 hours 02/08/17 1727     02/08/17 1445  piperacillin-tazobactam (ZOSYN) IVPB 3.375 g     3.375 g 100 mL/hr over 30  Minutes Intravenous  Once 02/08/17 1439 02/08/17 1658   02/08/17 1430  piperacillin-tazobactam (ZOSYN) IVPB 4.5 g  Status:  Discontinued     4.5 g 200 mL/hr over 30 Minutes Intravenous  Once 02/08/17 1421 02/08/17 1438   02/08/17 1030  clindamycin (CLEOCIN) IVPB 900 mg     900 mg 100 mL/hr over 30 Minutes Intravenous  Once 02/08/17 1026 02/08/17 1228        Subjective:   Jerry [name] was seen and examined today. Right facial swelling improving, started soft diet this morning, tolerating. Pain is controlled. No fevers or chills.  Patient denies dizziness, chest pain, shortness of breath, abdominal pain, N/V/D/C, new weakness, numbess, tingling. No acute events overnight.    Objective:   Vitals:   02/09/17 1439 02/09/17 2040 02/10/17 0552 02/10/17 1324  BP: 135/68 121/88 127/70 129/62  Pulse: 86 68 73 72  Resp:  '16 16 16  '$ Temp: 98.1 F (36.7 C) 97.6 F (36.4 C) 98.1 F (36.7 C) 98.4 F (36.9 C)  TempSrc: Oral Oral Oral Oral  SpO2: 100% 97% 97% 100%  Height:        Intake/Output Summary (Last 24 hours) at 02/10/17 1352 Last data filed at 02/10/17 1343  Gross per 24 hour  Intake             1130 ml  Output                0 ml  Net             1130 ml     Wt Readings from Last 3 Encounters:  10/20/16 90.3 kg (199 lb)  09/26/16 88.1 kg (194 lb 5 oz)  08/22/16 88.1 kg (194 lb 3.2 oz)     Exam  General: Alert and oriented x 3, NAD  HEENT:  PERRLA, EOMI, Anicteric Sclera, mucous membranes moist. Erythema and swelling on the right in the submandibular region, Significantly better  Neck: Supple, no JVD  Cardiovascular: S1 S2 auscultated, 2/6 SEM. Regular rate and rhythm.  Respiratory: Clear to auscultation bilaterally, no wheezing, rales or rhonchi  Gastrointestinal: Soft, nontender, nondistended, + bowel sounds  Ext: no cyanosis clubbing or edema  Neuro: no new deficits  Skin: No rashes  Psych: Normal affect and demeanor, alert and oriented x3    Data  Reviewed:  I have personally reviewed following labs and imaging studies  Micro Results Recent Results (from the past 240 hour(s))  Culture, blood (routine x 2)     Status: None (Preliminary result)   Collection Time: 02/08/17  5:51 PM  Result Value Ref Range Status   Specimen Description BLOOD LEFT ANTECUBITAL  Final  Special Requests BOTTLES DRAWN AEROBIC AND ANAEROBIC 10CC EACH  Final   Culture NO GROWTH 2 DAYS  Final   Report Status PENDING  Incomplete  Culture, blood (routine x 2)     Status: Abnormal   Collection Time: 02/08/17  6:00 PM  Result Value Ref Range Status   Specimen Description BLOOD RIGHT HAND  Final   Special Requests IN PEDIATRIC BOTTLE 2CC  Final   Culture  Setup Time   Final    GRAM POSITIVE COCCI IN CLUSTERS AEROBIC BOTTLE ONLY Organism ID to follow    Culture (A)  Final    STAPHYLOCOCCUS SPECIES (COAGULASE NEGATIVE) THE SIGNIFICANCE OF ISOLATING THIS ORGANISM FROM A SINGLE SET OF BLOOD CULTURES WHEN MULTIPLE SETS ARE DRAWN IS UNCERTAIN. PLEASE NOTIFY THE MICROBIOLOGY DEPARTMENT WITHIN ONE WEEK IF SPECIATION AND SENSITIVITIES ARE REQUIRED.    Report Status 02/10/2017 FINAL  Final  Blood Culture ID Panel (Reflexed)     Status: Abnormal   Collection Time: 02/08/17  6:00 PM  Result Value Ref Range Status   Enterococcus species NOT DETECTED NOT DETECTED Final   Listeria monocytogenes NOT DETECTED NOT DETECTED Final   Staphylococcus species DETECTED (A) NOT DETECTED Final    Comment: Methicillin (oxacillin) resistant coagulase negative staphylococcus. Possible blood culture contaminant (unless isolated from more than one blood culture draw or clinical case suggests pathogenicity). No antibiotic treatment is indicated for blood  culture contaminants. CRITICAL RESULT CALLED TO, READ BACK BY AND VERIFIED WITH: LPablo Lawrence.D. 16:20 02/09/17 ([email or web address])    Staphylococcus aureus NOT DETECTED NOT DETECTED Final   Methicillin resistance DETECTED (A) NOT DETECTED  Final    Comment: CRITICAL RESULT CALLED TO, READ BACK BY AND VERIFIED WITH: LPablo Lawrence.D. 16:20 02/09/17 (wilsonm)    Streptococcus species NOT DETECTED NOT DETECTED Final   Streptococcus agalactiae NOT DETECTED NOT DETECTED Final   Streptococcus pneumoniae NOT DETECTED NOT DETECTED Final   Streptococcus pyogenes NOT DETECTED NOT DETECTED Final   Acinetobacter baumannii NOT DETECTED NOT DETECTED Final   Enterobacteriaceae species NOT DETECTED NOT DETECTED Final   Enterobacter cloacae complex NOT DETECTED NOT DETECTED Final   Escherichia coli NOT DETECTED NOT DETECTED Final   Klebsiella oxytoca NOT DETECTED NOT DETECTED Final   Klebsiella pneumoniae NOT DETECTED NOT DETECTED Final   Proteus species NOT DETECTED NOT DETECTED Final   Serratia marcescens NOT DETECTED NOT DETECTED Final   Haemophilus influenzae NOT DETECTED NOT DETECTED Final   Neisseria meningitidis NOT DETECTED NOT DETECTED Final   Pseudomonas aeruginosa NOT DETECTED NOT DETECTED Final   Candida albicans NOT DETECTED NOT DETECTED Final   Candida glabrata NOT DETECTED NOT DETECTED Final   Candida krusei NOT DETECTED NOT DETECTED Final   Candida parapsilosis NOT DETECTED NOT DETECTED Final   Candida tropicalis NOT DETECTED NOT DETECTED Final    Radiology Reports Ct Soft Tissue Neck W Contrast  Result Date: 02/08/2017 CLINICAL DATA:  81 y/o M; neck swelling with difficulty swallowing and breathing. EXAM: CT NECK WITH CONTRAST TECHNIQUE: Multidetector CT imaging of the neck was performed using the standard protocol following the bolus administration of intravenous contrast. CONTRAST:  133m ISOVUE-300 IOPAMIDOL (ISOVUE-300) INJECTION 61% COMPARISON:  Concurrent CT of the chest.  CT neck dated 02/07/2017. FINDINGS: Pharynx and larynx: Interval development of severe swelling of the right sided of oropharyngeal mucosa extending into right lateral and posterior walls of the hypopharynx and larynx with essentially complete  effacement of the glottis. Additionally there is a small retropharyngeal fluid collection which  extends from the C1 -C6 levels. There is diffuse edema in the right face subcutaneous fat, bilateral submandibular spaces, right parotid space, and right-sided parapharyngeal fat. Subcutaneous edema extends into the anterior neck and right supraclavicular fossa. Salivary glands: Persistent swelling and enhancement of the right parotid gland with additional swelling of right submandibular greater than left submandibular glands progressed from prior study. Thyroid: Subcentimeter nodules. Lymph nodes: Reactive upper cervical prominent lymph nodes. Vascular: Atherosclerosis of bilateral carotid bifurcations with mild stenosis on the left. Limited intracranial: Negative. Visualized orbits: Negative. Mastoids and visualized paranasal sinuses: Clear. Skeleton: Cervical spondylosis with predominantly discogenic degenerative changes. No high-grade bony canal stenosis. Upper chest: 12 mm spiculated nodule in the right upper lobe of the lung. Mild emphysema. Other: None. IMPRESSION: 1. Interval development of severe swelling of right-sided oropharyngeal mucosa extending into the right lateral and posterior walls of the hypopharynx and larynx with essentially complete effacement of the glottic airway. Findings are compatible with severe acute pharyngitis. 2. Small retropharyngeal fluid collection. 3. Diffuse edema in subcutaneous fat of right face and anterior neck extending into submandibular spaces, right parotid space, and right parapharyngeal fat. 4. Persistent swelling of the right parotid gland and additional swelling of right greater than left submandibular glands which may be reactive or represent acute sialadenitis. 5. No discrete peripherally enhancing organized abscess identified. 6. Please refer to concurrent CT of the chest for evaluation of lungs and mediastinum. These results were called by telephone at the time of  interpretation on 02/08/2017 at 2:09 pm to Dr. Isla Pence , who verbally acknowledged these results. Electronically Signed   By: Kristine Garbe M.D.   On: 02/08/2017 14:11   Ct Soft Tissue Neck W Contrast  Result Date: 02/07/2017 CLINICAL DATA:  Right-sided facial swelling below jaw. EXAM: CT NECK WITH CONTRAST TECHNIQUE: Multidetector CT imaging of the neck was performed using the standard protocol following the bolus administration of intravenous contrast. CONTRAST:  15m ISOVUE-300 IOPAMIDOL (ISOVUE-300) INJECTION 61% COMPARISON:  None. FINDINGS: Pharynx and larynx: Mild edema in the right parapharyngeal space related to parotid swelling. Airway slightly displaced to the left. No airway compromise. No pharyngeal mass lesion. Salivary glands: Marked enlargement of the right parotid gland which shows diffuse edema. There is also extensive edema in the soft tissues around the right parotid gland extending into the subcutaneous fat and skin region. No abscess. No parotid mass or stone. Mass extends into the right parapharyngeal space. Edema also extends towards the right submandibular gland which also appears slightly enlarged. Left submandibular and parotid glands normal. Thyroid: Negative Lymph nodes: No pathologic lymph nodes. Vascular: Right jugular vein is compressed due to edema in the parotid gland and adjacent soft tissues. There is no intraluminal thrombus. There is carotid artery calcification bilaterally. Limited intracranial: Negative Visualized orbits: Negative Mastoids and visualized paranasal sinuses: Negative Skeleton: Cervical degenerative change. No dental abscess. No acute skeletal abnormality. Upper chest: Right upper lobe spiculated nodule measuring 12 mm, suspicious for neoplasm. This is incompletely evaluated on the current study. Other: None IMPRESSION: Right parotid gland enlargement and surrounding edema most compatible with acute infection. No abscess or mass. Edema extends  into the right parapharyngeal space and around the right submandibular gland. No airway compromise. The right jugular vein is compressed and may be occluded proximally but there is no thrombus present. Right upper lobe spiculated mass 12 mm. This is concerning for lung carcinoma. CT chest with contrast suggest for further evaluation. **An incidental finding of potential clinical significance has  been found. Right upper lobe mass** Electronically Signed   By: Franchot Gallo M.D.   On: 02/07/2017 15:14   Ct Chest W Contrast  Result Date: 02/08/2017 CLINICAL DATA:  Chest mass seen on neck CT. EXAM: CT CHEST WITH CONTRAST TECHNIQUE: Multidetector CT imaging of the chest was performed during intravenous contrast administration. CONTRAST:  187m ISOVUE-300 IOPAMIDOL (ISOVUE-300) INJECTION 61% COMPARISON:  None available FINDINGS: Cardiovascular: No cardiomegaly or pericardial effusion. Mild to moderate aortic valvular calcification. Atherosclerosis, including the coronary arteries. Mediastinum/Nodes: Negative for inflammation or adenopathy. Lungs/Pleura: Spiculated mixed density nodule in the right upper lobe measuring 15 mm. This is suspicious for bronchogenic carcinoma, especially adenocarcinoma. Pleural tag suspected on the coronal reformats. There is also a 14 mm solid nodule in the lateral costophrenic sulcus on the right (measured coronally). Subpleural nodules are likely lymph nodes, and will be followed. Mild centrilobular emphysema on reformats. Upper Abdomen: No acute finding Musculoskeletal: Spondylosis.  No acute or aggressive finding. IMPRESSION: 1. Transspatial neck infection with supraglottic laryngeal edema is described on dedicated neck CT. No extension into the mediastinum. Recommend aggressive treatment. 2. 15 mm mixed density nodule in the right upper lobe is suspicious for bronchogenic carcinoma -especially adenocarcinoma. After convalescence PET-CT is recommended. 3. 14 x 7 mm solid nodule in the  right lower lobe. 4. Mild centrilobular emphysema. Electronically Signed   By: JMonte FantasiaM.D.   On: 02/08/2017 14:11   Ct Abdomen Pelvis W Contrast  Result Date: 02/09/2017 CLINICAL DATA:  Submandibular pain, swelling. Prostate cancer. Evaluate for metastases. EXAM: CT ABDOMEN AND PELVIS WITH CONTRAST TECHNIQUE: Multidetector CT imaging of the abdomen and pelvis was performed using the standard protocol following bolus administration of intravenous contrast. CONTRAST:  80 cc Isovue-300 IV COMPARISON:  None. FINDINGS: Lower chest: 7 mm nodule at the right lung base on image 10. Lung bases otherwise clear. No effusions. Heart is normal size. Hepatobiliary: No focal hepatic abnormality. Gallbladder unremarkable. Pancreas: No focal abnormality or ductal dilatation. Spleen: No focal abnormality.  Normal size. Adrenals/Urinary Tract: Small cyst in the mid pole of the right kidney. No hydronephrosis. Fifth adrenal glands and urinary bladder unremarkable. Stomach/Bowel: Sigmoid diverticulosis. No active diverticulitis. Stomach and small bowel decompressed, unremarkable. Vascular/Lymphatic: Diffuse aortic and iliac calcifications. No aneurysm or adenopathy. Reproductive: Radiation seeds in the region of the prostate. Other: No free fluid or free air. There is a soft tissue tract noted to the right of the urinary bladder extending from near the base of the cecum for the prostate. This does not appear to represent adenopathy. I favor this represents scar tissue, possibly a collapsed fluid pocket related to the patient's prior penile implant and removal. Musculoskeletal: No acute bony abnormality. Degenerative changes throughout the lumbar spine. IMPRESSION: 7 mm nodule at the right lung base. Recommend attention on follow-up imaging. No acute findings in the abdomen or pelvis. Tubular soft tissue along the right side of the bladder and pelvic side wall felt to be postoperative, possibly related to patient's penile  implant. Electronically Signed   By: KRolm BaptiseM.D.   On: 02/09/2017 14:35    Lab Data:  CBC:  Recent Labs Lab 02/07/17 1405 02/08/17 1101 02/09/17 0255  WBC  --  11.8* 8.7  NEUTROABS  --  8.9*  --   HGB 14.6 12.1* 12.0*  HCT 43.0 37.0* 37.2*  MCV  --  90.0 89.2  PLT  --  167 1867  Basic Metabolic Panel:  Recent Labs Lab 02/07/17 1405 02/08/17  1101 02/09/17 0255  NA 144 140 140  K 4.1 3.7 4.1  CL 106 109 110  CO2  --  23 23  GLUCOSE 80 98 161*  BUN '13 10 15  '$ CREATININE 1.30* 1.29* 1.49*  CALCIUM  --  8.7* 8.6*   GFR: CrCl cannot be calculated (Unknown ideal weight.). Liver Function Tests:  Recent Labs Lab 02/08/17 1101 02/09/17 0255  AST 21 20  ALT 24 23  ALKPHOS 63 61  BILITOT 0.9 1.2  PROT 6.6 7.2  ALBUMIN 3.4* 3.3*   No results for input(s): LIPASE, AMYLASE in the last 168 hours. No results for input(s): AMMONIA in the last 168 hours. Coagulation Profile:  Recent Labs Lab 02/09/17 0255  INR 1.17   Cardiac Enzymes: No results for input(s): CKTOTAL, CKMB, CKMBINDEX, TROPONINI in the last 168 hours. BNP (last 3 results) No results for input(s): PROBNP in the last 8760 hours. HbA1C: No results for input(s): HGBA1C in the last 72 hours. CBG:  Recent Labs Lab 02/09/17 2036  GLUCAP 141*   Lipid Profile: No results for input(s): CHOL, HDL, LDLCALC, TRIG, CHOLHDL, LDLDIRECT in the last 72 hours. Thyroid Function Tests: No results for input(s): TSH, T4TOTAL, FREET4, T3FREE, THYROIDAB in the last 72 hours. Anemia Panel: No results for input(s): VITAMINB12, FOLATE, FERRITIN, TIBC, IRON, RETICCTPCT in the last 72 hours. Urine analysis:    Component Value Date/Time   COLORURINE YELLOW 02/08/2017 1127   APPEARANCEUR CLEAR 02/08/2017 1127   LABSPEC >1.030 (H) 02/08/2017 1127   PHURINE 5.0 02/08/2017 1127   GLUCOSEU NEGATIVE 02/08/2017 1127   HGBUR MODERATE (A) 02/08/2017 1127   BILIRUBINUR NEGATIVE 02/08/2017 1127   BILIRUBINUR n  08/01/2016 1147   KETONESUR NEGATIVE 02/08/2017 1127   PROTEINUR NEGATIVE 02/08/2017 1127   UROBILINOGEN 0.2 08/01/2016 1147   NITRITE NEGATIVE 02/08/2017 1127   LEUKOCYTESUR NEGATIVE 02/08/2017 [place] M.D. [facility] 02/10/2017, 1:52 PM  Pager: [phone] Between 7am to 7pm - call Pager - [phone]  After 7pm go to [email or web address] - password [email or web address]  Call night coverage person covering after 7pm

## 2017-02-10 NOTE — Care Management Note (Signed)
Case Management Note  Patient Details  Name: KEIVON GARDEN MRN: [id number] Date of Birth: 02-28-1936  Subjective/Objective:                    Action/Plan:   Expected Discharge Date:                  Expected Discharge Plan:  Home/Self Care  In-House Referral:     Discharge planning Services     Post Acute Care Choice:    Choice offered to:     DME Arranged:    DME Agency:     HH Arranged:    Oval Agency:     Status of Service:  Completed, signed off  If discussed at [employer] of Stay Meetings, dates discussed:    Additional Comments:  Marilu Favre, RN 02/10/2017, 2:02 PM

## 2017-02-10 NOTE — Progress Notes (Signed)
  Echocardiogram 2D Echocardiogram has been performed.  Jerry [name] 02/10/2017, 3:22 PM

## 2017-02-10 NOTE — Evaluation (Signed)
Physical Therapy Evaluation and D/C  Patient Details Name: Jerry [name] MRN: [id number] DOB: 09/12/1936 Today's Date: 02/10/2017   History of Present Illness  year old male with PMH of CKD stage III, prostate cancer (docmented however, family states all they now is that he had polyps on prostate), dementia, HTN, and diverticulosis. Initially seen 2/13 in ED for two day history of facial/neck edema and sent home with Augmentin regimen and ENT appointment. On 2/14 swelling progressively got worse with noted dyspnea/neck pain and came back to ED. Patient started on Zosyn and decadron for acute right parotid sialadenitis. CT neck/chest revealed 15 mm mixed density nodule in the right upper lobe and 14x7 solid nodule in the right lower lobe concerning for malignancy. PCCM was asked to consult.   Clinical Impression  Pt admitted with above diagnosis. Pt currently without functional limitations. Pt was able to ambulate with independence.  No physical limitations.   Will not benefit from skilled PT but would benefit from [facility] to assist with home management and medications management.  Will sign off.  Thanks.     Follow Up Recommendations Other (comment) ([facility] aide)    Equipment Recommendations  None recommended by PT    Recommendations for Other Services       Precautions / Restrictions Precautions Precautions: None Restrictions Weight Bearing Restrictions: No      Mobility  Bed Mobility Overal bed mobility: Independent                Transfers Overall transfer level: Independent                  Ambulation/Gait Ambulation/Gait assistance: Independent     Gait Pattern/deviations: WFL(Within Functional Limits)   Gait velocity interpretation: <1.8 ft/sec, indicative of risk for recurrent falls General Gait Details: No issues with gait with challenges.    Stairs Stairs: Yes Stairs assistance: Supervision Stair Management: One rail Right;Forwards;Alternating  pattern Number of Stairs: 5 General stair comments: No problems with up and down steps with good safety.   Wheelchair Mobility    Modified Rankin (Stroke Patients Only)       Balance Overall balance assessment: Independent                               Standardized Balance Assessment Standardized Balance Assessment : Dynamic Gait Index   Dynamic Gait Index Level Surface: Normal Change in Gait Speed: Normal Gait with Horizontal Head Turns: Normal Gait with Vertical Head Turns: Normal Gait and Pivot Turn: Normal Step Over Obstacle: Normal Step Around Obstacles: Normal Steps: Mild Impairment Total Score: 23       Pertinent Vitals/Pain Pain Assessment: No/denies pain  VSS  Home Living Family/patient expects to be discharged to:: Private residence Living Arrangements: Alone Available Help at Discharge: Family;Available PRN/intermittently Type of Home: House Home Access: Stairs to enter Entrance Stairs-Rails: None Entrance Stairs-Number of Steps: 1 Home Layout: Two level;Bed/bath upstairs Home Equipment: None      Prior Function Level of Independence: Independent               Hand Dominance        Extremity/Trunk Assessment   Upper Extremity Assessment Upper Extremity Assessment: Defer to OT evaluation    Lower Extremity Assessment Lower Extremity Assessment: Overall WFL for tasks assessed    Cervical / Trunk Assessment Cervical / Trunk Assessment: Normal  Communication   Communication: No difficulties  Cognition Arousal/Alertness: Awake/alert Behavior  During Therapy: Flat affect Overall Cognitive Status: History of cognitive impairments - at baseline Area of Impairment: Memory;Orientation;Awareness;Problem solving Orientation Level: Disoriented to;Time;Situation   Memory: Decreased short-term memory     Awareness: Intellectual Problem Solving: Slow processing General Comments: Daughter states he forgets to take medications.      General Comments General comments (skin integrity, edema, etc.): Scored 23/24 on DGI.  No issues with balance.  Daughter concerned about pts memory as he has STM loss.  Pt thought it was Dec.  Clearly memory issues.      Exercises     Assessment/Plan    PT Assessment All further PT needs can be met in the next venue of care  PT Problem List Decreased cognition          PT Treatment Interventions      PT Goals (Current goals can be found in the Care Plan section)  Acute Rehab PT Goals PT Goal Formulation: All assessment and education complete, DC therapy    Frequency     Barriers to discharge        Co-evaluation               End of Session Equipment Utilized During Treatment: Gait belt Activity Tolerance: Patient tolerated treatment well Patient left: in chair;with call bell/phone within reach;with family/visitor present Nurse Communication: Mobility status         Time: 1364-3837 PT Time Calculation (min) (ACUTE ONLY): 33 min   Charges:   PT Evaluation $PT Eval Moderate Complexity: 1 Procedure PT Treatments $Gait Training: 8-22 mins   PT G Codes:        Jerry [name] 03/10/2017, 12:30 PM Jerry [name],PT Acute Rehabilitation [phone] (pager)

## 2017-02-11 LAB — BASIC METABOLIC PANEL
Anion gap: 7 (ref 5–15)
BUN: 17 mg/dL (ref 6–20)
CALCIUM: 8.1 mg/dL — AB (ref 8.9–10.3)
CO2: 24 mmol/L (ref 22–32)
CREATININE: 1.29 mg/dL — AB (ref 0.61–1.24)
Chloride: 110 mmol/L (ref 101–111)
GFR calc Af Amer: 60 mL/min — ABNORMAL LOW (ref >60)
GFR calc non Af Amer: 51 mL/min — ABNORMAL LOW (ref >60)
GLUCOSE: 127 mg/dL — AB (ref 65–99)
Potassium: 3.9 mmol/L (ref 3.5–5.1)
Sodium: 141 mmol/L (ref 135–145)

## 2017-02-11 MED ORDER — AMOXICILLIN-POT CLAVULANATE 875-125 MG PO TABS: 1.0000 | ORAL_TABLET | Freq: Two times a day (BID) | ORAL | 0 refills | Status: AC

## 2017-02-11 MED ORDER — AMOXICILLIN-POT CLAVULANATE 875-125 MG PO TABS
1.0000 | ORAL_TABLET | Freq: Two times a day (BID) | ORAL | Status: DC
  Administered 2017-02-11: 1 via ORAL
  Filled 2017-02-11: qty 1

## 2017-02-11 MED ORDER — AMOXICILLIN-POT CLAVULANATE 875-125 MG PO TABS
1.0000 | ORAL_TABLET | Freq: Two times a day (BID) | ORAL | Status: DC
Start: 2017-02-11 — End: 2017-02-11

## 2017-02-11 NOTE — Discharge Summary (Signed)
Physician Discharge Summary   Patient ID: Jerry [name] MRN: [id number] DOB/AGE: 79/17/1937 79 y.o.  Admit date: 02/08/2017 Discharge date: 02/11/2017  Primary Care Physician:  Garret Reddish, MD  Discharge Diagnoses:        Acute right parotid Sialdenitis    15 mm right upper lobe nodule   Acute on chronic kidney disease stage III . Essential hypertension . Hyperlipidemia . Moderate dementia without behavioral disturbance   Consults: ENT Interventional radiology Pulmonology  Recommendations for Outpatient Follow-up:  1. Outpatient PFTs scheduled 2. Pulmonology follow-up scheduled for outpatient PET scan and further management of lung nodules   DIET: Heart healthy diet    Allergies:  No Known Allergies   DISCHARGE MEDICATIONS: Current Discharge Medication List    CONTINUE these medications which have CHANGED   Details  amoxicillin-clavulanate (AUGMENTIN) 875-125 MG tablet Take 1 tablet by mouth 2 (two) times daily. X 10 days Qty: 20 tablet, Refills: 0      CONTINUE these medications which have NOT CHANGED   Details  amLODipine-olmesartan (AZOR) 5-40 MG tablet Take 1 tablet by mouth daily. Qty: 30 tablet, Refills: 5    atorvastatin (LIPITOR) 40 MG tablet Take 1 tablet (40 mg total) by mouth daily. Qty: 30 tablet, Refills: 5   Associated Diagnoses: Hyperlipidemia    donepezil (ARICEPT) 5 MG tablet Take once a day Qty: 30 tablet, Refills: 5   Associated Diagnoses: Memory loss      STOP taking these medications     memantine (NAMENDA) 10 MG tablet          Brief H and P: For complete details please refer to admission H and P, but in brief Jerry L Wrightis a 81 y.o.malewith medical history significant of prostate ca, diverticulosis, HTN, presenting with painful right sided facial and neck swelling for last 3 days, progressively worsening. Per patient painful on palpation and turning of the neck. Otherwise denied any dysphagia or difficulty  breathing. Initially seen in the ED on 02/07/2017 and started on Augmentin after consultation with ENT. On the day of admission, swelling had extended beyond the midline of the chin and increasingly more painful. Patient was admitted and ENT was reconsulted.  Hospital Course:   Acute right parotid Sialdenitis  - CT of the soft tissue neck showed severe swelling of the right-sided oropharyngeal mucosa extending into the right lateral and posterior walls of the hypopharynx and larynx with essentially complete effacement of the glottic airway compatible with severe acute pharyngitis, diffuse edema in the subcutaneous fat of the right face and anterior neck extending into the submandibular spaces, right carotid space and right parapharyngeal fat . No abscess - ENT was consulted. Patient was placed on IV Zosyn and Decadron 10 mg 1.  - Patient was seen by Dr. Janace Hoard, recommended continue Zosyn and IV Solu-Medrol. .  - Swelling has significantly improved, Patient tolerating solid soft diet. Patient was transitioned to oral Augmentin for 10 days.  Lung Nodules:  - CT chest showed 15 mm nodule in the right upper lobe, 14 x 7 mm solid nodule in the right lower lobe. Concerning for malignancy. Discussed in detail with patient and daughter at the bedside. Patient has history of smoking however quit smoking more than 20 years ago. - CT contrast abd/pelvis showed no intra-abdominal pathology or any lesions. - Discussed with interventional radiology, recommended outpatient PET CT scan and pulmonology consultation  - Patient was seen by pulmonology, recommended 2-D echo and PFTs and further management outpatient once parotid inflammation  has completely resolved. 2-D echo showed EF of 42-68%, grade 1 diastolic dysfunction, no wall motion abnormalities. Outpatient PFT scheduled on February 28 at 2 PM.   Acute on CKD stage III:  -Baseline creatinine 1.2-1.3 - Creatinine trended up to 1.49 likely due to contrasted  studies. Patient was gently hydrated, azor was held. At the time of discharge, creatinine back to baseline at 1.2.   HTN: - Continue amlodipine  HLD: - continue Lipitor  Dementia: mild and at baseline. Family member acts as POA - continue aricept . Per family he is not on Namenda   Day of Discharge BP 132/64 (BP Location: Right Arm)   Pulse (!) 52   Temp 97.8 F (36.6 C) (Oral)   Resp 19   Ht '5\' 9"'$  (1.753 m)   SpO2 100%   Physical Exam: General: Alert and awake oriented x3 not in any acute distress. HEENT: anicteric sclera, pupils reactive to light and accommodation. Right-sided submandibular inflammation has resolved.  CVS: S1-S2 clear no murmur rubs or gallops Chest: clear to auscultation bilaterally, no wheezing rales or rhonchi Abdomen: soft nontender, nondistended, normal bowel sounds Extremities: no cyanosis, clubbing or edema noted bilaterally Neuro: Cranial nerves II-XII intact, no focal neurological deficits   The results of significant diagnostics from this hospitalization (including imaging, microbiology, ancillary and laboratory) are listed below for reference.    LAB RESULTS: Basic Metabolic Panel:  Recent Labs Lab 02/09/17 0255 02/11/17 0614  NA 140 141  K 4.1 3.9  CL 110 110  CO2 23 24  GLUCOSE 161* 127*  BUN 15 17  CREATININE 1.49* 1.29*  CALCIUM 8.6* 8.1*   Liver Function Tests:  Recent Labs Lab 02/08/17 1101 02/09/17 0255  AST 21 20  ALT 24 23  ALKPHOS 63 61  BILITOT 0.9 1.2  PROT 6.6 7.2  ALBUMIN 3.4* 3.3*   No results for input(s): LIPASE, AMYLASE in the last 168 hours. No results for input(s): AMMONIA in the last 168 hours. CBC:  Recent Labs Lab 02/08/17 1101 02/09/17 0255  WBC 11.8* 8.7  NEUTROABS 8.9*  --   HGB 12.1* 12.0*  HCT 37.0* 37.2*  MCV 90.0 89.2  PLT 167 185   Cardiac Enzymes: No results for input(s): CKTOTAL, CKMB, CKMBINDEX, TROPONINI in the last 168 hours. BNP: Invalid input(s):  POCBNP CBG:  Recent Labs Lab 02/09/17 2036  GLUCAP 141*    Significant Diagnostic Studies:  Ct Soft Tissue Neck W Contrast  Result Date: 02/08/2017 CLINICAL DATA:  81 y/o M; neck swelling with difficulty swallowing and breathing. EXAM: CT NECK WITH CONTRAST TECHNIQUE: Multidetector CT imaging of the neck was performed using the standard protocol following the bolus administration of intravenous contrast. CONTRAST:  169m ISOVUE-300 IOPAMIDOL (ISOVUE-300) INJECTION 61% COMPARISON:  Concurrent CT of the chest.  CT neck dated 02/07/2017. FINDINGS: Pharynx and larynx: Interval development of severe swelling of the right sided of oropharyngeal mucosa extending into right lateral and posterior walls of the hypopharynx and larynx with essentially complete effacement of the glottis. Additionally there is a small retropharyngeal fluid collection which extends from the C1 -C6 levels. There is diffuse edema in the right face subcutaneous fat, bilateral submandibular spaces, right parotid space, and right-sided parapharyngeal fat. Subcutaneous edema extends into the anterior neck and right supraclavicular fossa. Salivary glands: Persistent swelling and enhancement of the right parotid gland with additional swelling of right submandibular greater than left submandibular glands progressed from prior study. Thyroid: Subcentimeter nodules. Lymph nodes: Reactive upper cervical prominent lymph nodes.  Vascular: Atherosclerosis of bilateral carotid bifurcations with mild stenosis on the left. Limited intracranial: Negative. Visualized orbits: Negative. Mastoids and visualized paranasal sinuses: Clear. Skeleton: Cervical spondylosis with predominantly discogenic degenerative changes. No high-grade bony canal stenosis. Upper chest: 12 mm spiculated nodule in the right upper lobe of the lung. Mild emphysema. Other: None. IMPRESSION: 1. Interval development of severe swelling of right-sided oropharyngeal mucosa extending into  the right lateral and posterior walls of the hypopharynx and larynx with essentially complete effacement of the glottic airway. Findings are compatible with severe acute pharyngitis. 2. Small retropharyngeal fluid collection. 3. Diffuse edema in subcutaneous fat of right face and anterior neck extending into submandibular spaces, right parotid space, and right parapharyngeal fat. 4. Persistent swelling of the right parotid gland and additional swelling of right greater than left submandibular glands which may be reactive or represent acute sialadenitis. 5. No discrete peripherally enhancing organized abscess identified. 6. Please refer to concurrent CT of the chest for evaluation of lungs and mediastinum. These results were called by telephone at the time of interpretation on 02/08/2017 at 2:09 pm to Dr. Isla Pence , who verbally acknowledged these results. Electronically Signed   By: Kristine Garbe M.D.   On: 02/08/2017 14:11   Ct Chest W Contrast  Result Date: 02/08/2017 CLINICAL DATA:  Chest mass seen on neck CT. EXAM: CT CHEST WITH CONTRAST TECHNIQUE: Multidetector CT imaging of the chest was performed during intravenous contrast administration. CONTRAST:  158m ISOVUE-300 IOPAMIDOL (ISOVUE-300) INJECTION 61% COMPARISON:  None available FINDINGS: Cardiovascular: No cardiomegaly or pericardial effusion. Mild to moderate aortic valvular calcification. Atherosclerosis, including the coronary arteries. Mediastinum/Nodes: Negative for inflammation or adenopathy. Lungs/Pleura: Spiculated mixed density nodule in the right upper lobe measuring 15 mm. This is suspicious for bronchogenic carcinoma, especially adenocarcinoma. Pleural tag suspected on the coronal reformats. There is also a 14 mm solid nodule in the lateral costophrenic sulcus on the right (measured coronally). Subpleural nodules are likely lymph nodes, and will be followed. Mild centrilobular emphysema on reformats. Upper Abdomen: No acute  finding Musculoskeletal: Spondylosis.  No acute or aggressive finding. IMPRESSION: 1. Transspatial neck infection with supraglottic laryngeal edema is described on dedicated neck CT. No extension into the mediastinum. Recommend aggressive treatment. 2. 15 mm mixed density nodule in the right upper lobe is suspicious for bronchogenic carcinoma -especially adenocarcinoma. After convalescence PET-CT is recommended. 3. 14 x 7 mm solid nodule in the right lower lobe. 4. Mild centrilobular emphysema. Electronically Signed   By: JMonte FantasiaM.D.   On: 02/08/2017 14:11   Ct Abdomen Pelvis W Contrast  Result Date: 02/09/2017 CLINICAL DATA:  Submandibular pain, swelling. Prostate cancer. Evaluate for metastases. EXAM: CT ABDOMEN AND PELVIS WITH CONTRAST TECHNIQUE: Multidetector CT imaging of the abdomen and pelvis was performed using the standard protocol following bolus administration of intravenous contrast. CONTRAST:  80 cc Isovue-300 IV COMPARISON:  None. FINDINGS: Lower chest: 7 mm nodule at the right lung base on image 10. Lung bases otherwise clear. No effusions. Heart is normal size. Hepatobiliary: No focal hepatic abnormality. Gallbladder unremarkable. Pancreas: No focal abnormality or ductal dilatation. Spleen: No focal abnormality.  Normal size. Adrenals/Urinary Tract: Small cyst in the mid pole of the right kidney. No hydronephrosis. Fifth adrenal glands and urinary bladder unremarkable. Stomach/Bowel: Sigmoid diverticulosis. No active diverticulitis. Stomach and small bowel decompressed, unremarkable. Vascular/Lymphatic: Diffuse aortic and iliac calcifications. No aneurysm or adenopathy. Reproductive: Radiation seeds in the region of the prostate. Other: No free fluid or free air. There  is a soft tissue tract noted to the right of the urinary bladder extending from near the base of the cecum for the prostate. This does not appear to represent adenopathy. I favor this represents scar tissue, possibly a  collapsed fluid pocket related to the patient's prior penile implant and removal. Musculoskeletal: No acute bony abnormality. Degenerative changes throughout the lumbar spine. IMPRESSION: 7 mm nodule at the right lung base. Recommend attention on follow-up imaging. No acute findings in the abdomen or pelvis. Tubular soft tissue along the right side of the bladder and pelvic side wall felt to be postoperative, possibly related to patient's penile implant. Electronically Signed   By: Rolm Baptise M.D.   On: 02/09/2017 14:35    2D ECHO: Study Conclusions  - Left ventricle: The cavity size was normal. Wall thickness was   increased in a pattern of moderate LVH. Systolic function was   normal. The estimated ejection fraction was in the range of 60%   to 65%. Wall motion was normal; there were no regional wall   motion abnormalities. Doppler parameters are consistent with   abnormal left ventricular relaxation (grade 1 diastolic   dysfunction). - Aortic valve: Mildly calcified annulus. Mildly thickened, mildly   calcified leaflets. - Left atrium: The atrium was mildly dilated.   Disposition and Follow-up: Discharge Instructions    Diet - low sodium heart healthy    Complete by:  As directed    Increase activity slowly    Complete by:  As directed        DISPOSITION: home   [facility] Follow up on 02/27/2017.   Specialty:  Pulmonology Why:  February 27, 2017 (2pm) Contact information: [place]       Garret Reddish, MD. Schedule an appointment as soon as possible for a visit in 2 week(s).   Specialty:  Family Medicine Contact information: [place] [address] [phone]        Melissa Montane, MD. Schedule an appointment as soon as possible for a visit in 2 week(s).   Specialty:  Otolaryngology Contact information: [address] [place] [address] [phone]            Time spent on Discharge: 79mns   Signed:   Zamara Cozad M.D. [facility] 02/11/2017, 9:49 AM Pager: [phone]

## 2017-02-13 LAB — CULTURE, BLOOD (ROUTINE X 2): Culture: NO GROWTH

## 2017-02-17 ENCOUNTER — Ambulatory Visit (HOSPITAL_COMMUNITY)
Admission: RE | Admit: 2017-02-17 | Discharge: 2017-02-17 | Disposition: A | Discharge status: Home / Self Care | Payer: Medicare Other | Source: Ambulatory Visit | Attending: Nurse Practitioner | Admitting: Nurse Practitioner

## 2017-02-17 DIAGNOSIS — R942 Abnormal results of pulmonary function studies: Secondary | ICD-10-CM | POA: Diagnosis not present

## 2017-02-17 DIAGNOSIS — J449 Chronic obstructive pulmonary disease, unspecified: Secondary | ICD-10-CM | POA: Insufficient documentation

## 2017-02-17 DIAGNOSIS — Z87891 Personal history of nicotine dependence: Secondary | ICD-10-CM | POA: Diagnosis not present

## 2017-02-17 DIAGNOSIS — K1120 Sialoadenitis, unspecified: Secondary | ICD-10-CM | POA: Diagnosis present

## 2017-02-17 LAB — PULMONARY FUNCTION TEST
DL/VA % pred: 96 %
DL/VA: 4.36 ml/min/mmHg/L
DLCO cor % pred: 66 %
DLCO cor: 20.7 ml/min/mmHg
DLCO unc % pred: 61 %
DLCO unc: 19 ml/min/mmHg
FEF 25-75 Post: 1.42 L/sec
FEF 25-75 Pre: 1.23 L/sec
FEF2575-%Change-Post: 15 %
FEF2575-%PRED-PRE: 60 %
FEF2575-%Pred-Post: 70 %
FEV1-%Change-Post: 3 %
FEV1-%PRED-PRE: 74 %
FEV1-%Pred-Post: 77 %
FEV1-POST: 1.99 L
FEV1-PRE: 1.91 L
FEV1FVC-%CHANGE-POST: 6 %
FEV1FVC-%Pred-Pre: 95 %
FEV6-%Change-Post: 0 %
FEV6-%PRED-PRE: 78 %
FEV6-%Pred-Post: 77 %
FEV6-POST: 2.6 L
FEV6-PRE: 2.6 L
FEV6FVC-%Change-Post: 2 %
FEV6FVC-%PRED-POST: 105 %
FEV6FVC-%PRED-PRE: 102 %
FVC-%CHANGE-POST: -2 %
FVC-%PRED-POST: 74 %
FVC-%PRED-PRE: 76 %
FVC-POST: 2.63 L
FVC-PRE: 2.69 L
POST FEV6/FVC RATIO: 99 %
PRE FEV1/FVC RATIO: 71 %
Post FEV1/FVC ratio: 76 %
Pre FEV6/FVC Ratio: 97 %
RV % PRED: 100 %
RV: 2.58 L
TLC % PRED: 78 %
TLC: 5.38 L

## 2017-02-17 MED ORDER — ALBUTEROL SULFATE (2.5 MG/3ML) 0.083% IN NEBU
2.5000 mg | INHALATION_SOLUTION | Freq: Once | RESPIRATORY_TRACT | Status: AC
  Administered 2017-02-17: 2.5 mg via RESPIRATORY_TRACT

## 2017-02-21 ENCOUNTER — Telehealth: Payer: Self-pay | Admitting: Neurology

## 2017-02-21 NOTE — Telephone Encounter (Signed)
Jerry [name] 01/16/1936. The DMV is needing a referral  faxed ([phone])  to them regarding him taking a test to be able to continue driving. Thank you

## 2017-02-22 ENCOUNTER — Encounter: Payer: Self-pay | Admitting: Neurology

## 2017-02-22 NOTE — Telephone Encounter (Signed)
I would recommend he do the driving test with the DMV, this was written on the note faxed to them. Thanks

## 2017-02-22 NOTE — Telephone Encounter (Signed)
Will forward message to provider for further instructions.

## 2017-02-23 NOTE — Telephone Encounter (Signed)
Clld DMV - spoke to [name]/CSR - advsd I was calling to very their fax number and information they needed from our office regarding the patient's ability to continue driving. Jerry [name] the patient was advised that an occupational therapist evaluation is needed with the results being faxed to: Medical Department                         With his date of birth                        Fax: [phone]   Jerry [name] I would forward message to provider.

## 2017-02-23 NOTE — Telephone Encounter (Signed)
Pls let patient know that the DMV wants him to have another driving evaluation and have the results of the evaluation faxed to their office. He needs to schedule the evaluation with [employer], tel [phone]. Thanks

## 2017-02-23 NOTE — Telephone Encounter (Signed)
Clld pt - advsd of provider's notoation. Pt stated he understood and would contact the Social research officer, government.

## 2017-02-27 ENCOUNTER — Ambulatory Visit (INDEPENDENT_AMBULATORY_CARE_PROVIDER_SITE_OTHER): Payer: Medicare Other | Admitting: Adult Health

## 2017-02-27 ENCOUNTER — Telehealth: Payer: Self-pay | Admitting: Adult Health

## 2017-02-27 ENCOUNTER — Encounter: Payer: Self-pay | Admitting: Adult Health

## 2017-02-27 VITALS — BP 124/76 | HR 73 | Ht 68.5 in | Wt 201.8 lb

## 2017-02-27 DIAGNOSIS — R918 Other nonspecific abnormal finding of lung field: Secondary | ICD-10-CM | POA: Diagnosis not present

## 2017-02-27 DIAGNOSIS — K1120 Sialoadenitis, unspecified: Secondary | ICD-10-CM

## 2017-02-27 NOTE — Assessment & Plan Note (Signed)
Recent flare now resolved with abx Keep follow up with ENT .

## 2017-02-27 NOTE — Progress Notes (Signed)
$'@Patient'W$  ID: Jerry [name], male    DOB: 09/09/1936, 81 y.o.   MRN: [id number]  Chief Complaint  Patient presents with  . Follow-up    lung nodule     Referring provider: Marin Olp, MD  HPI: 81 year old male former smoker with known dementia seen for pulmonary consult during hospitalization 01/3017 .   02/27/2017 Follow up : Lung nodule  Patient presents for a post hospital follow-up. Patient was seen for a pulmonary consult during recent hospitalization. Patient was admitted last month for an acute right parotid Sialdenitis . Workup showed an incidental finding of a 15 mm lung nodule in the right upper lobe. He was treated with antibiotics .  He had PFT that showed mild restriction with FEV1 77%, ratio 76, FVC 74%. DLCO 61%.  Echo showed Mild LVH, Gr 1 DD , EF preserved.  We discussed his test results .  He says since discharge he is feeling much better. Swelling in face /neck has resolved. Has ov with ENT this week.     No Known Allergies  Immunization History  Administered Date(s) Administered  . Influenza Whole 11/06/2007, 10/07/2008, 12/09/2010  . Influenza, High Dose Seasonal PF 08/22/2016  . Influenza,inj,Quad PF,36+ Mos 10/15/2014  . Pneumococcal Conjugate-13 05/08/2015  . Pneumococcal Polysaccharide-23 11/06/2007  . Td 12/26/2006    Past Medical History:  Diagnosis Date  . Cancer of prostate ([place]) 2001  . Chronic kidney disease (CKD), stage III (moderate)    Jerry [name] 02/08/2017  . Dementia   . DIVERTICULOSIS, COLON 07/25/2007   Qualifier: Diagnosis of  By: Jerry [name], Jerry [name]   . High cholesterol   . Hypertension     Tobacco History: History  Smoking Status  . Former Smoker  . Packs/day: 0.50  . Quit date: 12/26/1988  Smokeless Tobacco  . Never Used    Comment: "I don't remember how long really" (02/08/2017)   Counseling given: Not Answered   Outpatient Encounter Prescriptions as of 02/27/2017  Medication Sig  . amLODipine-olmesartan (AZOR)  5-40 MG tablet Take 1 tablet by mouth daily.  Marland Kitchen atorvastatin (LIPITOR) 40 MG tablet Take 1 tablet (40 mg total) by mouth daily.  Marland Kitchen donepezil (ARICEPT) 5 MG tablet Take once a day (Patient taking differently: Take 5 mg by mouth at bedtime. Take once a day)  . [EXPIRED] amoxicillin-clavulanate (AUGMENTIN) 875-125 MG tablet Take 1 tablet by mouth 2 (two) times daily. X 10 days   No facility-administered encounter medications on file as of 02/27/2017.      Review of Systems  Constitutional:   No  weight loss, night sweats,  Fevers, chills, fatigue, or  lassitude.  HEENT:   No headaches,  Difficulty swallowing,  Tooth/dental problems, or  Sore throat,                No sneezing, itching, ear ache, nasal congestion, post nasal drip,   CV:  No chest pain,  Orthopnea, PND, swelling in lower extremities, anasarca, dizziness, palpitations, syncope.   GI  No heartburn, indigestion, abdominal pain, nausea, vomiting, diarrhea, change in bowel habits, loss of appetite, bloody stools.   Resp: No shortness of breath with exertion or at rest.  No excess mucus, no productive cough,  No non-productive cough,  No coughing up of blood.  No change in color of mucus.  No wheezing.  No chest wall deformity  Skin: no rash or lesions.  GU: no dysuria, change in color of urine, no urgency or frequency.  No flank  pain, no hematuria   MS:  No joint pain or swelling.  No decreased range of motion.  No back pain.    Physical Exam  BP 124/76 (BP Location: Left Arm, Cuff Size: Normal)   Pulse 73   Ht 5' 8.5" (1.74 m)   Wt 201 lb 12.8 oz (91.5 kg)   SpO2 99%   BMI 30.24 kg/m   GEN: A/Ox3; pleasant , NAD, elderly    HEENT:  [place]/AT,  EACs-clear, TMs-wnl, NOSE-clear, THROAT-clear, no lesions, no postnasal drip or exudate noted.   NECK:  Supple w/ fair ROM; no JVD; normal carotid impulses w/o bruits; no thyromegaly or nodules palpated; no lymphadenopathy.    RESP  Clear  P & A; w/o, wheezes/ rales/ or rhonchi.  no accessory muscle use, no dullness to percussion  CARD:  RRR, no m/r/g, no peripheral edema, pulses intact, no cyanosis or clubbing.  GI:   Soft & nt; nml bowel sounds; no organomegaly or masses detected.   Musco: Warm bil, no deformities or joint swelling noted.   Neuro: alert, no focal deficits noted.    Skin: Warm, no lesions or rashes    Lab Results:  CBC   BNP No results found for: BNP  ProBNP No results found for: PROBNP  Imaging: Ct Soft Tissue Neck W Contrast  Result Date: 02/08/2017 CLINICAL DATA:  81 y/o M; neck swelling with difficulty swallowing and breathing. EXAM: CT NECK WITH CONTRAST TECHNIQUE: Multidetector CT imaging of the neck was performed using the standard protocol following the bolus administration of intravenous contrast. CONTRAST:  163m ISOVUE-300 IOPAMIDOL (ISOVUE-300) INJECTION 61% COMPARISON:  Concurrent CT of the chest.  CT neck dated 02/07/2017. FINDINGS: Pharynx and larynx: Interval development of severe swelling of the right sided of oropharyngeal mucosa extending into right lateral and posterior walls of the hypopharynx and larynx with essentially complete effacement of the glottis. Additionally there is a small retropharyngeal fluid collection which extends from the C1 -C6 levels. There is diffuse edema in the right face subcutaneous fat, bilateral submandibular spaces, right parotid space, and right-sided parapharyngeal fat. Subcutaneous edema extends into the anterior neck and right supraclavicular fossa. Salivary glands: Persistent swelling and enhancement of the right parotid gland with additional swelling of right submandibular greater than left submandibular glands progressed from prior study. Thyroid: Subcentimeter nodules. Lymph nodes: Reactive upper cervical prominent lymph nodes. Vascular: Atherosclerosis of bilateral carotid bifurcations with mild stenosis on the left. Limited intracranial: Negative. Visualized orbits: Negative. Mastoids  and visualized paranasal sinuses: Clear. Skeleton: Cervical spondylosis with predominantly discogenic degenerative changes. No high-grade bony canal stenosis. Upper chest: 12 mm spiculated nodule in the right upper lobe of the lung. Mild emphysema. Other: None. IMPRESSION: 1. Interval development of severe swelling of right-sided oropharyngeal mucosa extending into the right lateral and posterior walls of the hypopharynx and larynx with essentially complete effacement of the glottic airway. Findings are compatible with severe acute pharyngitis. 2. Small retropharyngeal fluid collection. 3. Diffuse edema in subcutaneous fat of right face and anterior neck extending into submandibular spaces, right parotid space, and right parapharyngeal fat. 4. Persistent swelling of the right parotid gland and additional swelling of right greater than left submandibular glands which may be reactive or represent acute sialadenitis. 5. No discrete peripherally enhancing organized abscess identified. 6. Please refer to concurrent CT of the chest for evaluation of lungs and mediastinum. These results were called by telephone at the time of interpretation on 02/08/2017 at 2:09 pm to Dr. JIsla Pence,  who verbally acknowledged these results. Electronically Signed   By: Kristine Garbe M.D.   On: 02/08/2017 14:11   Ct Soft Tissue Neck W Contrast  Result Date: 02/07/2017 CLINICAL DATA:  Right-sided facial swelling below jaw. EXAM: CT NECK WITH CONTRAST TECHNIQUE: Multidetector CT imaging of the neck was performed using the standard protocol following the bolus administration of intravenous contrast. CONTRAST:  15m ISOVUE-300 IOPAMIDOL (ISOVUE-300) INJECTION 61% COMPARISON:  None. FINDINGS: Pharynx and larynx: Mild edema in the right parapharyngeal space related to parotid swelling. Airway slightly displaced to the left. No airway compromise. No pharyngeal mass lesion. Salivary glands: Marked enlargement of the right parotid  gland which shows diffuse edema. There is also extensive edema in the soft tissues around the right parotid gland extending into the subcutaneous fat and skin region. No abscess. No parotid mass or stone. Mass extends into the right parapharyngeal space. Edema also extends towards the right submandibular gland which also appears slightly enlarged. Left submandibular and parotid glands normal. Thyroid: Negative Lymph nodes: No pathologic lymph nodes. Vascular: Right jugular vein is compressed due to edema in the parotid gland and adjacent soft tissues. There is no intraluminal thrombus. There is carotid artery calcification bilaterally. Limited intracranial: Negative Visualized orbits: Negative Mastoids and visualized paranasal sinuses: Negative Skeleton: Cervical degenerative change. No dental abscess. No acute skeletal abnormality. Upper chest: Right upper lobe spiculated nodule measuring 12 mm, suspicious for neoplasm. This is incompletely evaluated on the current study. Other: None IMPRESSION: Right parotid gland enlargement and surrounding edema most compatible with acute infection. No abscess or mass. Edema extends into the right parapharyngeal space and around the right submandibular gland. No airway compromise. The right jugular vein is compressed and may be occluded proximally but there is no thrombus present. Right upper lobe spiculated mass 12 mm. This is concerning for lung carcinoma. CT chest with contrast suggest for further evaluation. **An incidental finding of potential clinical significance has been found. Right upper lobe mass** Electronically Signed   By: CFranchot GalloM.D.   On: 02/07/2017 15:14   Ct Chest W Contrast  Result Date: 02/08/2017 CLINICAL DATA:  Chest mass seen on neck CT. EXAM: CT CHEST WITH CONTRAST TECHNIQUE: Multidetector CT imaging of the chest was performed during intravenous contrast administration. CONTRAST:  1069mISOVUE-300 IOPAMIDOL (ISOVUE-300) INJECTION 61%  COMPARISON:  None available FINDINGS: Cardiovascular: No cardiomegaly or pericardial effusion. Mild to moderate aortic valvular calcification. Atherosclerosis, including the coronary arteries. Mediastinum/Nodes: Negative for inflammation or adenopathy. Lungs/Pleura: Spiculated mixed density nodule in the right upper lobe measuring 15 mm. This is suspicious for bronchogenic carcinoma, especially adenocarcinoma. Pleural tag suspected on the coronal reformats. There is also a 14 mm solid nodule in the lateral costophrenic sulcus on the right (measured coronally). Subpleural nodules are likely lymph nodes, and will be followed. Mild centrilobular emphysema on reformats. Upper Abdomen: No acute finding Musculoskeletal: Spondylosis.  No acute or aggressive finding. IMPRESSION: 1. Transspatial neck infection with supraglottic laryngeal edema is described on dedicated neck CT. No extension into the mediastinum. Recommend aggressive treatment. 2. 15 mm mixed density nodule in the right upper lobe is suspicious for bronchogenic carcinoma -especially adenocarcinoma. After convalescence PET-CT is recommended. 3. 14 x 7 mm solid nodule in the right lower lobe. 4. Mild centrilobular emphysema. Electronically Signed   By: JoMonte Fantasia.D.   On: 02/08/2017 14:11   Ct Abdomen Pelvis W Contrast  Result Date: 02/09/2017 CLINICAL DATA:  Submandibular pain, swelling. Prostate cancer. Evaluate for metastases. EXAM:  CT ABDOMEN AND PELVIS WITH CONTRAST TECHNIQUE: Multidetector CT imaging of the abdomen and pelvis was performed using the standard protocol following bolus administration of intravenous contrast. CONTRAST:  80 cc Isovue-300 IV COMPARISON:  None. FINDINGS: Lower chest: 7 mm nodule at the right lung base on image 10. Lung bases otherwise clear. No effusions. Heart is normal size. Hepatobiliary: No focal hepatic abnormality. Gallbladder unremarkable. Pancreas: No focal abnormality or ductal dilatation. Spleen: No focal  abnormality.  Normal size. Adrenals/Urinary Tract: Small cyst in the mid pole of the right kidney. No hydronephrosis. Fifth adrenal glands and urinary bladder unremarkable. Stomach/Bowel: Sigmoid diverticulosis. No active diverticulitis. Stomach and small bowel decompressed, unremarkable. Vascular/Lymphatic: Diffuse aortic and iliac calcifications. No aneurysm or adenopathy. Reproductive: Radiation seeds in the region of the prostate. Other: No free fluid or free air. There is a soft tissue tract noted to the right of the urinary bladder extending from near the base of the cecum for the prostate. This does not appear to represent adenopathy. I favor this represents scar tissue, possibly a collapsed fluid pocket related to the patient's prior penile implant and removal. Musculoskeletal: No acute bony abnormality. Degenerative changes throughout the lumbar spine. IMPRESSION: 7 mm nodule at the right lung base. Recommend attention on follow-up imaging. No acute findings in the abdomen or pelvis. Tubular soft tissue along the right side of the bladder and pelvic side wall felt to be postoperative, possibly related to patient's penile implant. Electronically Signed   By: Rolm Baptise M.D.   On: 02/09/2017 14:35     Assessment & Plan:   No problem-specific Assessment & Plan notes found for this encounter.     Rexene Edison, NP 02/27/2017

## 2017-02-27 NOTE — Telephone Encounter (Signed)
Spoke with the pt's spouse  She states pt had wanted to know if he could take his meds before coming in for his appt today  His appt is already over  I advised that in the future, there is no indication that he can not take his meds before coming here, unless otherwise instructed She verbalized understanding and nothing further needed

## 2017-02-27 NOTE — Assessment & Plan Note (Signed)
15 mm lung nodule in RUL in former smoker  PFT shows preserved lung function . Echo w/ preserved LVF .  Set up for PET scan   Plan  Patient Instructions  Set  Up PET scan . (re lung nodule )  Follow up with Dr. Janace Hoard this week as planned.  Follow up with Dr. Murlean Iba in 3-4 weeks and As needed

## 2017-02-27 NOTE — Patient Instructions (Addendum)
Set  Up PET scan . (re lung nodule )  Follow up with Dr. Janace Hoard this week as planned.  Follow up with Dr. Murlean Iba in 3-4 weeks and As needed

## 2017-03-01 DIAGNOSIS — J392 Other diseases of pharynx: Secondary | ICD-10-CM | POA: Diagnosis not present

## 2017-03-01 DIAGNOSIS — K1120 Sialoadenitis, unspecified: Secondary | ICD-10-CM | POA: Diagnosis not present

## 2017-03-01 DIAGNOSIS — K148 Other diseases of tongue: Secondary | ICD-10-CM | POA: Diagnosis not present

## 2017-03-03 ENCOUNTER — Other Ambulatory Visit: Payer: Self-pay | Admitting: Family Medicine

## 2017-03-03 ENCOUNTER — Telehealth: Payer: Self-pay | Admitting: Family Medicine

## 2017-03-03 DIAGNOSIS — R413 Other amnesia: Secondary | ICD-10-CM

## 2017-03-03 NOTE — Telephone Encounter (Signed)
Daughter states pt is in needs of home health nursing/aid for his onset dementia. Pt is forgetting to eat, take his meds,and now unable to drive. daughter has other syblings that are not able to check on pt on a regular basis.   [place], but anytime you can get this set up is great.

## 2017-03-03 NOTE — Telephone Encounter (Signed)
Is this daughter on DPR?  Most insurances do not cover a long term aide- they would need to call insurance to even see if this is covered.   In addition, even if it was covered, needs face to face visit within 90 days for insurance to approve

## 2017-03-08 ENCOUNTER — Ambulatory Visit (HOSPITAL_COMMUNITY)
Admission: RE | Admit: 2017-03-08 | Discharge: 2017-03-08 | Disposition: A | Discharge status: Home / Self Care | Payer: Medicare Other | Source: Ambulatory Visit | Attending: Adult Health | Admitting: Adult Health

## 2017-03-08 DIAGNOSIS — I700 Atherosclerosis of aorta: Secondary | ICD-10-CM | POA: Insufficient documentation

## 2017-03-08 DIAGNOSIS — R918 Other nonspecific abnormal finding of lung field: Secondary | ICD-10-CM | POA: Insufficient documentation

## 2017-03-08 DIAGNOSIS — I2510 Atherosclerotic heart disease of native coronary artery without angina pectoris: Secondary | ICD-10-CM | POA: Insufficient documentation

## 2017-03-08 DIAGNOSIS — N323 Diverticulum of bladder: Secondary | ICD-10-CM | POA: Insufficient documentation

## 2017-03-08 LAB — GLUCOSE, CAPILLARY: GLUCOSE-CAPILLARY: 82 mg/dL (ref 65–99)

## 2017-03-08 MED ORDER — FLUDEOXYGLUCOSE F - 18 (FDG) INJECTION: 10.0000 | Freq: Once | INTRAVENOUS | Status: DC | PRN

## 2017-03-08 NOTE — Telephone Encounter (Signed)
Pt is scheduled for a face to face visit on 03/23/17. The family has already talked to the insurance company who stated they would cover 30 hours a week of home health.

## 2017-03-14 ENCOUNTER — Telehealth: Payer: Self-pay | Admitting: Pulmonary Disease

## 2017-03-14 NOTE — Telephone Encounter (Signed)
   I discussed PET scan results with Albertine Patricia, patient's daughter. I mentioned her when patient was admitted last month. She mentioned [name], another daughter, who lives in [place], has a power of attorney.  PET scan shows hypermetabolic right upper lobe nodule (3.4 SUV). This is most certainly bronchogenic carcinoma. There was also a Nonspecific asymmetric with associated soft tissue fullness on the CT images. ENT consultation should be considered for correlation with direct visualization to exclude a mucosal neoplasm in this location.  I mentioned that patient would need a needle biopsy. It might be difficult to access the right upper lobe nodule via bronchoscopy because of peripheral location. Daughter wants to talk to the patient and her sister regarding needle biopsy and she'll get back to me. Patient has a follow-up with me in April 5.  I mentioned about the need for ENT follow-up regarding the suspicious lesion at the left tongue base. Daughter has made an appointment for patient to see ENT.  Monica Becton, MD 03/14/2017, 12:59 PM [facility] Pager [phone] After 3 pm or if no answer, call [phone]

## 2017-03-15 ENCOUNTER — Ambulatory Visit (INDEPENDENT_AMBULATORY_CARE_PROVIDER_SITE_OTHER): Payer: Medicare Other | Admitting: Pulmonary Disease

## 2017-03-15 ENCOUNTER — Other Ambulatory Visit (INDEPENDENT_AMBULATORY_CARE_PROVIDER_SITE_OTHER): Payer: Medicare Other

## 2017-03-15 ENCOUNTER — Encounter: Payer: Self-pay | Admitting: Pulmonary Disease

## 2017-03-15 VITALS — BP 118/78 | HR 93 | Ht 68.5 in | Wt 203.0 lb

## 2017-03-15 DIAGNOSIS — R0602 Shortness of breath: Secondary | ICD-10-CM | POA: Diagnosis not present

## 2017-03-15 DIAGNOSIS — R0609 Other forms of dyspnea: Secondary | ICD-10-CM | POA: Diagnosis not present

## 2017-03-15 DIAGNOSIS — R911 Solitary pulmonary nodule: Secondary | ICD-10-CM | POA: Diagnosis not present

## 2017-03-15 LAB — COMPREHENSIVE METABOLIC PANEL
ALBUMIN: 4.2 g/dL (ref 3.5–5.2)
ALT: 19 U/L (ref 0–53)
AST: 17 U/L (ref 0–37)
Alkaline Phosphatase: 87 U/L (ref 39–117)
BILIRUBIN TOTAL: 0.7 mg/dL (ref 0.2–1.2)
BUN: 16 mg/dL (ref 6–23)
CALCIUM: 9.4 mg/dL (ref 8.4–10.5)
CHLORIDE: 109 meq/L (ref 96–112)
CO2: 26 mEq/L (ref 19–32)
CREATININE: 1.24 mg/dL (ref 0.40–1.50)
GFR: 72.38 mL/min (ref >60.00)
Glucose, Bld: 98 mg/dL (ref 70–99)
Potassium: 4.3 mEq/L (ref 3.5–5.1)
Sodium: 141 mEq/L (ref 135–145)
Total Protein: 7.5 g/dL (ref 6.0–8.3)

## 2017-03-15 LAB — CBC WITH DIFFERENTIAL/PLATELET
BASOS PCT: 0.7 % (ref 0.0–3.0)
Basophils Absolute: 0 10*3/uL (ref 0.0–0.1)
EOS ABS: 0.1 10*3/uL (ref 0.0–0.7)
Eosinophils Relative: 0.9 % (ref 0.0–5.0)
HCT: 42.2 % (ref 39.0–52.0)
HEMOGLOBIN: 14 g/dL (ref 13.0–17.0)
LYMPHS PCT: 35.3 % (ref 12.0–46.0)
Lymphs Abs: 2.4 10*3/uL (ref 0.7–4.0)
MCHC: 33.1 g/dL (ref 30.0–36.0)
MCV: 89.6 fl (ref 78.0–100.0)
MONOS PCT: 5.5 % (ref 3.0–12.0)
Monocytes Absolute: 0.4 10*3/uL (ref 0.1–1.0)
Neutro Abs: 4 10*3/uL (ref 1.4–7.7)
Neutrophils Relative %: 57.6 % (ref 43.0–77.0)
Platelets: 226 10*3/uL (ref 150.0–400.0)
RBC: 4.71 Mil/uL (ref 4.22–5.81)
RDW: 14.5 % (ref 11.5–15.5)
WBC: 6.9 10*3/uL (ref 4.0–10.5)

## 2017-03-15 LAB — PROTIME-INR
INR: 1.2 ratio — ABNORMAL HIGH (ref 0.8–1.0)
PROTHROMBIN TIME: 12.1 s (ref 9.6–13.1)

## 2017-03-15 NOTE — Patient Instructions (Signed)
  It was a pleasure taking care of you today!  We will schedule you for a biopsy of your right upper lung nodule.   We will do blood work today.   Follow up with your ENT doctor.   Return to clinic in 4 weeks with Dr. Corrie Dandy or NP

## 2017-03-15 NOTE — Progress Notes (Signed)
Subjective:    Patient ID: [name], male    DOB: 08/17/1936, 81 y.o.   MRN: [id number]  HPI   Patient is here as f/u on his lung nodule and PET scan result.   I saw him as a consult when he was admitted in 01/2017 for jaw swelling.  He was diagnosed with acute right parotid sialodenitis. As part of the workup, he ended up getting a CT scan of his neck and chest. Chest CT scan revealed 1.5 cm mixed density nodule in the right upper lobe as well as a 1.4 cm solid nodule in the right lower lobe.  He was discharged improved.   We facilitated outpt PET scan.  I discussed PET scan results with Lanorris Kalisz, patient's daughter over the phone on 3/20.  I mentioned her when patient was admitted last month. She mentioned [name], another daughter, who lives in [place], has a power of attorney.  PET scan shows hypermetabolic right upper lobe nodule (3.4 SUV). This is most certainly bronchogenic carcinoma. There was also a Nonspecific hypermetabolism at the L tongue base with associated soft tissue fullness.  Based onthe CT images. ENT consultation should be considered for correlation with direct visualization to exclude a mucosal neoplasm in this location.  Patient and daughter are here today to discuss results of the PET scan.  Patient has mild baseline dementia but he is fairly functional and is able to take care of himself. He has exertional dyspnea with more than ADLs. Not too symptomatic.  Denies history of cancer. Not on blood thinners.   Patient ended up seeing ENT doctor on March 7. He knew about the PET scan to be done.  Review of Systems  Constitutional: Negative.   HENT: Negative.   Eyes: Negative.   Respiratory: Negative.   Cardiovascular: Negative.   Gastrointestinal: Negative.   Endocrine: Negative.   Genitourinary: Negative.   Musculoskeletal: Negative.   Skin: Negative.   Allergic/Immunologic: Negative.   Neurological: Negative.   Hematological: Negative.     Psychiatric/Behavioral: Negative.   All other systems reviewed and are negative.     Past Medical History:  Diagnosis Date  . Cancer of prostate ([place]) 2001  . Chronic kidney disease (CKD), stage III (moderate)    [name] 02/08/2017  . Dementia   . DIVERTICULOSIS, COLON 07/25/2007   Qualifier: Diagnosis of  By: [name], LPN, [name]   . High cholesterol   . Hypertension      Family History  Problem Relation Age of Onset  . Early death Father     cirrhosis  . Alcoholism Father   . Cirrhosis    . Breast cancer Sister      Past Surgical History:  Procedure Laterality Date  . EYE SURGERY    . INSERTION PROSTATE RADIATION SEED  07/2000   [name] 05/09/2011  . PENILE PROSTHESIS IMPLANT  02/2002   Insertion of Mentor three piece inflatable penile prosthesis./notes 05/09/2011  . PENILE PROSTHESIS PLACEMENT  06/2003   Placement of Mentor rod prosthesis/notes 05/09/2011  . REFRACTIVE SURGERY Bilateral   . REMOVAL OF PENILE PROSTHESIS  06/2003   Removal of three-piece Mentor prosthesis/notes 05/09/2011  . SCROTAL SURGERY  06/2003   Repair of scrotal defect/notes 05/09/2011    Social History   Social History  . Marital status: Widowed    Spouse name: N/A  . Number of children: N/A  . Years of education: N/A   Occupational History  . Not on file.  Social History Main Topics  . Smoking status: Former Smoker    Packs/day: 0.50    Quit date: 12/26/1988  . Smokeless tobacco: Never Used     Comment: "I don't remember how long really" (02/08/2017)  . Alcohol use No  . Drug use: No  . Sexual activity: Not Currently   Other Topics Concern  . Not on file   Social History Narrative   Separated since 2010. 4 kids. 4 grandkids. 2 greatgrandkids.       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 Dementia unfortunately stopped these travels.      No Known Allergies   Outpatient Medications Prior to Visit  Medication Sig Dispense  Refill  . amLODipine-olmesartan (AZOR) 5-40 MG tablet Take 1 tablet by mouth daily. 30 tablet 5  . atorvastatin (LIPITOR) 40 MG tablet Take 1 tablet (40 mg total) by mouth daily. 30 tablet 5  . donepezil (ARICEPT) 5 MG tablet Take once a day (Patient taking differently: Take 5 mg by mouth at bedtime. Take once a day) 30 tablet 5  . donepezil (ARICEPT) 5 MG tablet TAKE 1/2 TABLET BY MOUTH DAILY FOR 1 WEEK,THEN INCREASE TO 1 TABLET DAILY AND CONTINUE (Patient not taking: Reported on 03/15/2017) 30 tablet 3   No facility-administered medications prior to visit.    No orders of the defined types were placed in this encounter.       Objective:   Physical Exam  Vitals:  Vitals:   03/15/17 1230  BP: 118/78  Pulse: 93  SpO2: 100%  Weight: 203 lb (92.1 kg)  Height: 5' 8.5" (1.74 m)    Constitutional/General:  Pleasant, well-nourished, well-developed, not in any distress,  Comfortably seating.  Well kempt  Body mass index is 30.42 kg/m. Wt Readings from Last 3 Encounters:  03/15/17 203 lb (92.1 kg)  02/27/17 201 lb 12.8 oz (91.5 kg)  10/20/16 199 lb (90.3 kg)     HEENT: Pupils equal and reactive to light and accommodation. Anicteric sclerae. Normal nasal mucosa.   No oral  lesions,  mouth clear,  oropharynx clear, no postnasal drip. (-) Oral thrush. No dental caries.  Airway - Mallampati class III  Neck: No masses. Midline trachea. No JVD, (-) LAD. (-) bruits appreciated.  Respiratory/Chest: Grossly normal chest. (-) deformity. (-) Accessory muscle use.  Symmetric expansion. (-) Tenderness on palpation.  Resonant on percussion.  Diminished BS on both lower lung zones. (-) wheezing, crackles, rhonchi (-) egophony  Cardiovascular: Regular rate and  rhythm, heart sounds normal, no murmur or gallops, no peripheral edema  Gastrointestinal:  Normal bowel sounds. Soft, non-tender. No hepatosplenomegaly.  (-) masses.   Musculoskeletal:  Normal muscle tone. Normal gait.    Extremities: Grossly normal. (-) clubbing, cyanosis.  (-) edema  Skin: (-) rash,lesions seen.   Neurological/Psychiatric : alert, oriented to time, place, person. Normal mood and affect       Assessment & Plan:  Lung nodule 20 PY smoking history, quit in his 81s.  He has fair functional capacity. He has baseline mild dementia but is able to take care of himself.  Patient was admitted in February 2018 for acute right parotitis. Incidental finding of right upper lung nodule.  Chest CT scan revealed 1.5 cm mixed density nodule in the right upper lobe as well as a 1.4 cm solid nodule in the right lower lobe.    PET scan shows hypermetabolic right upper lobe nodule (3.4 SUV). This is  most certainly bronchogenic carcinoma. There was also a Nonspecific hypermetabolism at the L tongue base with associated soft tissue fullness.  Based onthe CT images. ENT consultation should be considered for correlation with direct visualization to exclude a mucosal neoplasm in this location.  Looking at the chest CT scan, patient also has a right lower lobe nodule which is solid and is probably scar tissue. What is concerning is the right upper lung nodule which is most likely bronchogenic cancer.  I extensively discussed with the patient and daughter  regarding the need for needle biopsy. We discussed possible complications of procedure which include, but not limited to the following: Pneumothorax, hypoxemia, adverse reaction to medicines, bleeding, infection, cardiovascular complications. We discussed risks of the procedures well. Patient and daughter accept risks and possible complications and want to proceed with procedure. We will schedule needle biopsy.  We will do blood work today (CBC, CMP, coags).  Patient is not on any blood thinners. Recent acute kidney injury when he was admitted likely from dehydration.  Also mentioned to the patient and daughter regarding follow-up with ENT soon as possible.  Something also lit up with the PET scan.  I'm not sure if it's related to the nodule, previous infection, or another malignancy. He will most likely be laryngoscopy.  Plan to see pt and daughter after Biopsy.   Exertional dyspnea Recent exertional dyspnea. Might be related to deconditioning +/- CHfpEF +/-  Very mild COPD. Better. Not on meds.    PFT FEV1/FVC 71%, FEV1  1.91  74%,  DLCO 61% Echo N EF  Observe for now.  Not too symptomatic.     Return to clinic in 4 weeks.   Monica Becton, MD 03/15/2017, 2:27 PM [facility] Pager [phone] After 3 pm or if no answer, call [phone]

## 2017-03-15 NOTE — Assessment & Plan Note (Signed)
Recent exertional dyspnea. Might be related to deconditioning +/- CHfpEF +/-  Very mild COPD. Better. Not on meds.    PFT FEV1/FVC 71%, FEV1  1.91  74%,  DLCO 61% Echo N EF  Observe for now.  Not too symptomatic.

## 2017-03-15 NOTE — Assessment & Plan Note (Addendum)
28 PY smoking history, quit in his 71s.  He has fair functional capacity. He has baseline mild dementia but is able to take care of himself.  Patient was admitted in February 2018 for acute right parotitis. Incidental finding of right upper lung nodule.  Chest CT scan revealed 1.5 cm mixed density nodule in the right upper lobe as well as a 1.4 cm solid nodule in the right lower lobe.    PET scan shows hypermetabolic right upper lobe nodule (3.4 SUV). This is most certainly bronchogenic carcinoma. There was also a Nonspecific hypermetabolism at the L tongue base with associated soft tissue fullness.  Based onthe CT images. ENT consultation should be considered for correlation with direct visualization to exclude a mucosal neoplasm in this location.  Looking at the chest CT scan, patient also has a right lower lobe nodule which is solid and is probably scar tissue. What is concerning is the right upper lung nodule which is most likely bronchogenic cancer.  I extensively discussed with the patient and daughter  regarding the need for needle biopsy. We discussed possible complications of procedure which include, but not limited to the following: Pneumothorax, hypoxemia, adverse reaction to medicines, bleeding, infection, cardiovascular complications. We discussed risks of the procedures well. Patient and daughter accept risks and possible complications and want to proceed with procedure. We will schedule needle biopsy.  We will do blood work today (CBC, CMP, coags).  Patient is not on any blood thinners. Recent acute kidney injury when he was admitted likely from dehydration.  Also mentioned to the patient and daughter regarding follow-up with ENT soon as possible. Something also lit up with the PET scan.  I'm not sure if it's related to the nodule, previous infection, or another malignancy. He will most likely be laryngoscopy.  Plan to see pt and daughter after Biopsy.

## 2017-03-16 ENCOUNTER — Telehealth: Payer: Self-pay | Admitting: Pulmonary Disease

## 2017-03-16 NOTE — Progress Notes (Signed)
Pt seen 3.21.18 by AD

## 2017-03-16 NOTE — Telephone Encounter (Signed)
AD  Spoke with pt's daughter and gave her the results of blood work, she had a question. She wanted to know with pt's kidney levels being slightly elevated do you foresee that interfering with pt's biopsy.    Also she did express that they would like you to do the biopsy on the pt if you are available, the pt is scheduled for 03/22/17 at [facility].

## 2017-03-16 NOTE — Telephone Encounter (Signed)
I spoke with pt's daughter.    Monica Becton, MD 03/16/2017, 12:24 PM [facility] Pager [phone] After 3 pm or if no answer, call [phone]

## 2017-03-20 ENCOUNTER — Telehealth: Payer: Self-pay | Admitting: Pulmonary Disease

## 2017-03-20 ENCOUNTER — Other Ambulatory Visit: Payer: Self-pay | Admitting: Radiology

## 2017-03-20 NOTE — Telephone Encounter (Signed)
AD  Pt has his CT biopsy on Wednesday, They are aware that pt should not have anything by mouth after midnight and no blood thinner to prep for procedure, Daughter just wanted to make absolute sure that pt can take his regular medication tomorrow and this wont effect the procedure.

## 2017-03-22 ENCOUNTER — Encounter (HOSPITAL_COMMUNITY): Payer: Self-pay

## 2017-03-22 ENCOUNTER — Ambulatory Visit (HOSPITAL_COMMUNITY)
Admission: RE | Admit: 2017-03-22 | Discharge: 2017-03-22 | Disposition: A | Discharge status: Home / Self Care | Payer: Medicare Other | Source: Ambulatory Visit | Attending: Interventional Radiology | Admitting: Interventional Radiology

## 2017-03-22 ENCOUNTER — Ambulatory Visit (HOSPITAL_COMMUNITY)
Admission: RE | Admit: 2017-03-22 | Discharge: 2017-03-22 | Disposition: A | Discharge status: Home / Self Care | Payer: Medicare Other | Source: Ambulatory Visit | Attending: Pulmonary Disease | Admitting: Pulmonary Disease

## 2017-03-22 DIAGNOSIS — R911 Solitary pulmonary nodule: Secondary | ICD-10-CM | POA: Diagnosis not present

## 2017-03-22 DIAGNOSIS — Z79899 Other long term (current) drug therapy: Secondary | ICD-10-CM | POA: Insufficient documentation

## 2017-03-22 DIAGNOSIS — F0390 Unspecified dementia without behavioral disturbance: Secondary | ICD-10-CM | POA: Insufficient documentation

## 2017-03-22 DIAGNOSIS — R0602 Shortness of breath: Secondary | ICD-10-CM | POA: Diagnosis not present

## 2017-03-22 DIAGNOSIS — Z87891 Personal history of nicotine dependence: Secondary | ICD-10-CM | POA: Diagnosis not present

## 2017-03-22 DIAGNOSIS — C3411 Malignant neoplasm of upper lobe, right bronchus or lung: Secondary | ICD-10-CM | POA: Diagnosis not present

## 2017-03-22 DIAGNOSIS — R0600 Dyspnea, unspecified: Secondary | ICD-10-CM | POA: Diagnosis present

## 2017-03-22 DIAGNOSIS — Z9889 Other specified postprocedural states: Secondary | ICD-10-CM | POA: Diagnosis not present

## 2017-03-22 LAB — CBC
HCT: 38.3 % — ABNORMAL LOW (ref 39.0–52.0)
Hemoglobin: 12.7 g/dL — ABNORMAL LOW (ref 13.0–17.0)
MCH: 29.5 pg (ref 26.0–34.0)
MCHC: 33.2 g/dL (ref 30.0–36.0)
MCV: 88.9 fL (ref 78.0–100.0)
PLATELETS: 209 10*3/uL (ref 150–400)
RBC: 4.31 MIL/uL (ref 4.22–5.81)
RDW: 13.6 % (ref 11.5–15.5)
WBC: 7.1 10*3/uL (ref 4.0–10.5)

## 2017-03-22 LAB — PROTIME-INR
INR: 0.98
PROTHROMBIN TIME: 13 s (ref 11.4–15.2)

## 2017-03-22 LAB — APTT: aPTT: 30 seconds (ref 24–36)

## 2017-03-22 MED ORDER — MIDAZOLAM HCL 2 MG/2ML IJ SOLN
INTRAMUSCULAR | Status: AC
  Filled 2017-03-22: qty 4

## 2017-03-22 MED ORDER — LIDOCAINE HCL 1 % IJ SOLN
INTRAMUSCULAR | Status: AC
  Filled 2017-03-22: qty 20

## 2017-03-22 MED ORDER — FENTANYL CITRATE (PF) 100 MCG/2ML IJ SOLN
INTRAMUSCULAR | Status: AC
  Filled 2017-03-22: qty 4

## 2017-03-22 MED ORDER — SODIUM CHLORIDE 0.9 % IV SOLN
INTRAVENOUS | Status: AC | PRN
  Administered 2017-03-22: 10 mL/h via INTRAVENOUS

## 2017-03-22 MED ORDER — SODIUM CHLORIDE 0.9 % IV SOLN: INTRAVENOUS | Status: DC

## 2017-03-22 MED ORDER — MIDAZOLAM HCL 2 MG/2ML IJ SOLN
INTRAMUSCULAR | Status: AC | PRN
  Administered 2017-03-22 (×2): 0.5 mg via INTRAVENOUS

## 2017-03-22 MED ORDER — FENTANYL CITRATE (PF) 100 MCG/2ML IJ SOLN
INTRAMUSCULAR | Status: AC | PRN
  Administered 2017-03-22 (×2): 25 ug via INTRAVENOUS

## 2017-03-22 NOTE — Discharge Instructions (Signed)
Lung Biopsy  A lung biopsy is a procedure to remove a tissue sample from the lung. The tissue can be examined under a microscope to help diagnose various lung disorders.  There are three types of lung biopsies:   Needle biopsy. In this type, the sample is removed with a needle.   Bronchoscopy. In this type, the sample is removed through a flexible tube inserted into the lungs (bronchoscope).   Open biopsy. In this type, the sample is removed through an incision made in the chest.    Tell a health care provider about:   Any allergies you have.   All medicines you are taking, including vitamins, herbs, eye drops, creams, and over-the-counter medicines.   Any problems you or family members have had with anesthetic medicines.   Any blood disorders or bleeding problems that you have.   Any surgeries you have had.   Any medical conditions you have.   Whether you are pregnant or may be pregnant.  What are the risks?  Generally, this is a safe procedure. However, problems may occur, including:   Collapsed lung.   Bleeding.   Infection.   Pain.    What happens before the procedure?  Staying hydrated  Follow instructions from your health care provider about hydration, which may include:   Up to 2 hours before the procedure - you may continue to drink clear liquids, such as water, clear fruit juice, black coffee, and plain tea.    Eating and drinking restrictions  Follow instructions from your health care provider about eating and drinking, which may include:   8 hours before the procedure - stop eating heavy meals or foods such as meat, fried foods, or fatty foods.   6 hours before the procedure - stop eating light meals or foods, such as toast or cereal.   6 hours before the procedure - stop drinking milk or drinks that contain milk.   2 hours before the procedure - stop drinking clear liquids.    General instructions   Ask your health care provider about:  ? Changing or stopping your regular medicines.  This is especially important if you take diabetes medicines or blood thinners.  ? Taking medicines such as aspirin and ibuprofen. These medicines can thin your blood. Do not take these medicines before your procedure if your health care provider instructs you not to.   Plan to have someone take you home from the hospital or clinic.  What happens during the procedure?   To lower your risk of infection:  ? Your health care team will wash or sanitize their hands.  ? If you are having an open biopsy, your skin will be washed with soap.   An IV tube may be inserted into one of your veins.   You may be given one or both of the following:  ? A medicine to help you relax (sedative) during the procedure.  ? A medicine to numb the area where the biopsy sample will be taken (local anesthetic).  ? A medicine to make you sleep through the procedure (general anesthetic).   If you have a needle biopsy:  ? A biopsy needle will be inserted into your lung. A CT scanner may be used to guide the needle to the right place.  ? The needle will be used to collect the tissue sample.   If you have bronchoscopy:  ? A bronchoscope will be inserted into your lungs through your mouth or nose.  ? A   sutures). The procedure may vary among health care providers and hospitals. What happens after the procedure?  Your blood pressure, heart rate, breathing rate, and blood oxygen level will be monitored until the medicines you were given have worn off.  If a needle biopsy was performed, a bandage (dressing) will be applied over the area where the needle was inserted. You may be asked to apply pressure to the bandage for several minutes to ensure there  is minimal bleeding.  If a bronchoscope was used, you may have a cough and some soreness in your throat.  Do not drive for 24 hours if you were given a sedative. Summary  A lung biopsy is a procedure to remove a tissue sample from your lung. The sample is examined to help diagnose various lung disorders.  A lung biopsy may be done using a needle, by inserting a tube into the lungs, or through an incision in the chest.  After your lung biopsy, you may have a cough and some throat soreness. This information is not intended to replace advice given to you by your health care provider. Make sure you discuss any questions you have with your health care provider. Document Released: 03/02/2005 Document Revised: 11/02/2016 Document Reviewed: 11/02/2016 Elsevier Interactive Patient Education  2017 [employer] Needle Biopsy of the Lung, Care After This sheet gives you information about how to care for yourself after your procedure. Your health care provider may also give you more specific instructions. If you have problems or questions, contact your health care provider. What can I expect after the procedure? After the procedure, it is common to have:  Soreness, pain, and tenderness where a tissue sample was taken (biopsy site).  A cough.  A sore throat. Follow these instructions at home: Biopsy site care   Follow instructions from your health care provider about when to remove the bandage that was placed on the biopsy site.  Keep the bandage dry until it has been removed.  Check your biopsy site every day for signs of infection. Check for:  More redness, swelling, or pain.  More fluid or blood.  Warmth to the touch.  Pus or a bad smell. General instructions   Rest as directed by your health care provider. Ask your health care provider what activities are safe for you.  Do not take baths, swim, or use a hot tub until your health care provider approves.  Take over-the-counter  and prescription medicines only as told by your health care provider.  If you have airplane travel scheduled, talk with your health care provider about when it is safe for you to travel by airplane.  It is up to you to get the results of your procedure. Ask your health care provider, or the department that is doing the procedure, when your results will be ready.  Keep all follow-up visits as told by your health care provider. This is important. Contact a health care provider if:  You have more redness, swelling, or pain around your biopsy site.  You have more fluid or blood coming from your biopsy site.  Your biopsy site feels warm to the touch.  You have pus or a bad smell coming from your biopsy site.  You have a fever.  You have pain that does not get better with medicine. Get help right away if:  You have problems breathing.  You have chest pain.  You cough up blood.  You faint.  You have a fast heart rate. Summary  After a needle biopsy of the lung, it is common to have a cough, a sore throat, or soreness, pain, and tenderness where a tissue sample was taken (biopsy site).  You should check your biopsy area every day for signs of infection, including pus or a bad smell, warmth, more fluid or blood, or more redness, swelling, or pain.  You should not take baths, swim, or use a hot tub until your health care provider approves.  It is up to you to get the results of your procedure. Ask your health care provider, or the department that is doing the procedure, when your results will be ready. This information is not intended to replace advice given to you by your health care provider. Make sure you discuss any questions you have with your health care provider. Document Released: 10/09/2007 Document Revised: 11/02/2016 Document Reviewed: 11/02/2016 Elsevier Interactive Patient Education  2017 [employer]. Moderate Conscious Sedation, Adult, Care After These instructions  provide you with information about caring for yourself after your procedure. Your health care provider may also give you more specific instructions. Your treatment has been planned according to current medical practices, but problems sometimes occur. Call your health care provider if you have any problems or questions after your procedure. What can I expect after the procedure? After your procedure, it is common:  To feel sleepy for several hours.  To feel clumsy and have poor balance for several hours.  To have poor judgment for several hours.  To vomit if you eat too soon. Follow these instructions at home: For at least 24 hours after the procedure:    Do not:  Participate in activities where you could fall or become injured.  Drive.  Use heavy machinery.  Drink alcohol.  Take sleeping pills or medicines that cause drowsiness.  Make important decisions or sign legal documents.  Take care of children on your own.  Rest. Eating and drinking   Follow the diet recommended by your health care provider.  If you vomit:  Drink water, juice, or soup when you can drink without vomiting.  Make sure you have little or no nausea before eating solid foods. General instructions   Have a responsible adult stay with you until you are awake and alert.  Take over-the-counter and prescription medicines only as told by your health care provider.  If you smoke, do not smoke without supervision.  Keep all follow-up visits as told by your health care provider. This is important. Contact a health care provider if:  You keep feeling nauseous or you keep vomiting.  You feel light-headed.  You develop a rash.  You have a fever. Get help right away if:  You have trouble breathing. This information is not intended to replace advice given to you by your health care provider. Make sure you discuss any questions you have with your health care provider. Document Released: 10/02/2013  Document Revised: 05/16/2016 Document Reviewed: 04/02/2016 Elsevier Interactive Patient Education  2017 [employer].

## 2017-03-22 NOTE — Procedures (Signed)
Pre procedural Dx: Hypermetabolic right upper lobe pulmonary nodule  Post procedural Dx: Same  Technically successful CT guided biopsy of indeterminate right upper lobe pulmonary nodule.   EBL: None.   Complications: None immediate.   Ronny Bacon, MD Pager #: [phone]

## 2017-03-22 NOTE — H&P (Signed)
Chief Complaint: Patient was seen in consultation today for right lung mass biopsy at the request of [facility]  Referring Physician(s): Jerry [name]  Supervising Physician: Jerry [name]  Patient Status: [facility] - Out-pt  History of Present Illness: Jerry [name] is [name] 81 y.o. male   Hx anemia Hx Prostate Ca Hx mild dementia  Pt was hospitalized 01/2017 for right parotid sialadenitis Work up included CT neck/chest Findings were RUL and RLL nodules Recent PET 3/14: IMPRESSION: 1. Mildly hypermetabolic spiculated 1.4 cm predominantly solid right upper lobe pulmonary nodule, suspicious for primary bronchogenic adenocarcinoma. 2. No findings suspicious for hypermetabolic thoracic adenopathy or distant metastatic disease. 3. Nonspecific asymmetric hypermetabolism at the left tongue base with associated soft tissue fullness on the CT images. ENT consultation should be considered for correlation with direct visualization to exclude [name] mucosal neoplasm in this location. 4. Nonspecific focal hypermetabolism adjacent to the anterior superior right bladder wall, favored to represent activity within [name] small bladder diverticulum. Adjacent stable postsurgical scarring in the right deep pelvis probably from prior partial removal of penile prosthesis. 5. Aortic atherosclerosis.  Three-vessel coronary atherosclerosis  Referred to Dr Jerry [name] Scheduled now for biopsy of RUL lesion  Past Medical History:  Diagnosis Date  . Cancer of prostate ([place]) 2001  . Chronic kidney disease (CKD), stage III (moderate)    Jerry [name] 02/08/2017  . Dementia   . DIVERTICULOSIS, COLON 07/25/2007   Qualifier: Diagnosis of  By: Jerry [name], [name]   . High cholesterol   . Hypertension     Past Surgical History:  Procedure Laterality Date  . EYE SURGERY    . INSERTION PROSTATE RADIATION SEED  07/2000   Jerry [name] 05/09/2011  . PENILE PROSTHESIS IMPLANT  02/2002   Insertion of  Mentor three piece inflatable penile prosthesis./notes 05/09/2011  . PENILE PROSTHESIS PLACEMENT  06/2003   Placement of Mentor rod prosthesis/notes 05/09/2011  . REFRACTIVE SURGERY Bilateral   . REMOVAL OF PENILE PROSTHESIS  06/2003   Removal of three-piece Mentor prosthesis/notes 05/09/2011  . SCROTAL SURGERY  06/2003   Repair of scrotal defect/notes 05/09/2011    Allergies: Namenda [memantine]  Medications: Prior to Admission medications   Medication Sig Start Date End Date Taking? Authorizing Provider  amLODipine-olmesartan (AZOR) 5-40 MG tablet Take 1 tablet by mouth daily. Patient taking differently: Take 1 tablet by mouth every morning.  08/08/16   Jerry [name]  atorvastatin (LIPITOR) 40 MG tablet Take 1 tablet (40 mg total) by mouth daily. Patient taking differently: Take 40 mg by mouth every morning.  08/08/16 08/08/17  Jerry [name]  donepezil (ARICEPT) 5 MG tablet Take once [name] day Patient taking differently: Take 5 mg by mouth every morning. Take once [name] day 08/08/16   Jerry [name]     Family History  Problem Relation Age of Onset  . Early death Father     cirrhosis  . Alcoholism Father   . Cirrhosis    . Breast cancer Sister     Social History   Social History  . Marital status: Widowed    Spouse name: N/[name]  . Number of children: N/[name]  . Years of education: N/[name]   Social History Main Topics  . Smoking status: Former Smoker    Packs/day: 0.50    Quit date: 12/26/1988  . Smokeless tobacco: Never Used     Comment: "I don't remember how long really" (02/08/2017)  . Alcohol  use No  . Drug use: No  . Sexual activity: Not Currently   Other Topics Concern  . None   Social History Narrative   Separated since 2010. 4 kids. 4 grandkids. 2 greatgrandkids.       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 [name]. Dementia unfortunately stopped these travels.      Review of Systems: [name] 12 point ROS discussed and  pertinent positives are indicated in the HPI above.  All other systems are negative.  Review of Systems  Constitutional: Negative for activity change, fatigue and fever.  Respiratory: Negative for cough and shortness of breath.   Gastrointestinal: Negative for abdominal pain.  Musculoskeletal: Positive for back pain.  Neurological: Negative for weakness.  Psychiatric/Behavioral: Negative for behavioral problems and confusion.    Vital Signs: BP 117/71   Pulse 76   Temp 98.7 F (37.1 C) (Oral)   Resp 16   Ht '5\' 8"'$  (1.727 m)   Wt 200 lb (90.7 kg)   SpO2 99%   BMI 30.41 kg/m   Physical Exam  Constitutional: He is oriented to person, place, and time.  Cardiovascular: Normal rate, regular rhythm and normal heart sounds.   Pulmonary/Chest: Effort normal and breath sounds normal. He has no wheezes.  Abdominal: Soft. Bowel sounds are normal. There is no tenderness.  Musculoskeletal: Normal range of motion.  Neurological: He is alert and oriented to person, place, and time.  Skin: Skin is warm and dry.  Psychiatric: He has [name] normal mood and affect. His behavior is normal. Judgment and thought content normal.  Nursing note and vitals reviewed.   Mallampati Score:  [name] Evaluation Airway: WNL Heart: WNL Abdomen: WNL Chest/ Lungs: WNL ASA  Classification: 3 Mallampati/Airway Score: Two  Imaging: Nm Pet Image Initial (pi) Skull Base To Thigh  Result Date: 03/08/2017 CLINICAL DATA:  Initial treatment strategy for multiple pulmonary nodules detected incidentally on [name] recent neck CT performed for right neck infection. EXAM: NUCLEAR MEDICINE PET SKULL BASE TO THIGH TECHNIQUE: 10.0 mCi F-18 FDG was injected intravenously. Full-ring PET imaging was performed from the skull base to thigh after the radiotracer. CT data was obtained and used for attenuation correction and anatomic localization. FASTING BLOOD GLUCOSE:  Value: 82 mg/dl COMPARISON:  02/08/2017 neck and chest CT. 02/09/2017 CT  abdomen/ pelvis. FINDINGS: NECK No hypermetabolic lymph nodes in the neck. There is nonspecific asymmetric hypermetabolism (max SUV 7.6) at the left tongue base with associated soft tissue fullness on the CT images (series 4/image 34). CHEST Left anterior descending, left circumflex and right coronary atherosclerosis. Atherosclerotic nonaneurysmal thoracic aorta. No pneumothorax. No pleural effusions. No enlarged or hypermetabolic mediastinal or hilar lymph nodes. Mildly hypermetabolic (max SUV 3.0) nonenlarged left axillary lymph node is favored to injection related (the patient was reportedly injected with radiotracer in the left antecubital fossa). No enlarged or additional hypermetabolic axillary lymph nodes. Mildly hypermetabolic (max SUV 3.4) spiculated prominently solid 1.4 x 1.2 cm right upper lobe pulmonary nodule (series 8/image 30), not appreciably changed since 02/08/2017. Subpleural peripheral right middle lobe 4 mm solid pulmonary nodule (series 8/image 46), posterior right lower lobe subpleural 4 mm solid pulmonary nodule (series 8/image 45), peripheral basilar right lower lobe 8 mm solid pulmonary nodule (series 8/image 55) and peripheral basilar subpleural 5 mm left lower lobe pulmonary nodule (series 8/image 53) are all below PET resolution, not associated with significant metabolism, and are all stable since 08/22/2006 CT abdomen/pelvis study, considered benign.  No acute consolidative airspace disease or additional significant pulmonary nodules. ABDOMEN/PELVIS No abnormal hypermetabolic activity within the liver, pancreas, adrenal glands, or spleen. No hypermetabolic lymph nodes in the abdomen or pelvis. Moderate sigmoid diverticulosis. Brachytherapy seeds are noted in the prostate. Stable non hypermetabolic tubular soft tissue in the right deep pelvis between the bladder and right pelvic sidewall (series 4/image 176), which is non hypermetabolic and consistent with chronic postsurgical change.  Penile prosthesis is in place within the corpora cavernosa. Nonspecific focal hypermetabolism adjacent to the anterior superior right bladder wall is favored to represent activity within [name] small bladder diverticulum. SKELETON No focal hypermetabolic activity to suggest skeletal metastasis. IMPRESSION: 1. Mildly hypermetabolic spiculated 1.4 cm predominantly solid right upper lobe pulmonary nodule, suspicious for primary bronchogenic adenocarcinoma. 2. No findings suspicious for hypermetabolic thoracic adenopathy or distant metastatic disease. 3. Nonspecific asymmetric hypermetabolism at the left tongue base with associated soft tissue fullness on the CT images. ENT consultation should be considered for correlation with direct visualization to exclude [name] mucosal neoplasm in this location. 4. Nonspecific focal hypermetabolism adjacent to the anterior superior right bladder wall, favored to represent activity within [name] small bladder diverticulum. Adjacent stable postsurgical scarring in the right deep pelvis probably from prior partial removal of penile prosthesis. 5. Aortic atherosclerosis.  Three-vessel coronary atherosclerosis. Electronically Jerry [name]   By: Ilona Sorrel M.D.   On: 03/08/2017 14:23    Labs:  CBC:  Recent Labs  02/08/17 1101 02/09/17 0255 03/15/17 1325 03/22/17 0630  WBC 11.8* 8.7 6.9 7.1  HGB 12.1* 12.0* 14.0 12.7*  HCT 37.0* 37.2* 42.2 38.3*  PLT 167 185 226.0 209    COAGS:  Recent Labs  02/09/17 0255 03/15/17 1325  INR 1.17 1.2*  APTT 31  --     BMP:  Recent Labs  02/08/17 1101 02/09/17 0255 02/11/17 0614 03/15/17 1325  NA 140 140 141 141  K 3.7 4.1 3.9 4.3  CL 109 110 110 109  CO2 '23 23 24 26  '$ GLUCOSE 98 161* 127* 98  BUN '10 15 17 16  '$ CALCIUM 8.7* 8.6* 8.1* 9.4  CREATININE 1.29* 1.49* 1.29* 1.24  GFRNONAA 51* 43* 51*  --   GFRAA 60* 50* 60*  --     LIVER FUNCTION TESTS:  Recent Labs  08/01/16 1132 02/08/17 1101 02/09/17 0255 03/15/17 1325    BILITOT 0.6 0.9 1.2 0.7  AST '14 21 20 17  '$ ALT '10 24 23 19  '$ ALKPHOS 77 63 61 87  PROT 7.5 6.6 7.2 7.5  ALBUMIN 3.9 3.4* 3.3* 4.2    TUMOR MARKERS: No results for input(s): AFPTM, CEA, CA199, CHROMGRNA in the last 8760 hours.  Assessment and Plan:  Right lung mass: +PET Now scheduled for lung mass biopsy Risks and Benefits discussed with the patient including, but not limited to bleeding, hemoptysis, respiratory failure requiring intubation, infection, pneumothorax requiring chest tube placement, stroke from air embolism or even death. All of the patient's questions were answered, patient is agreeable to proceed. Consent Jerry [name] and in chart.  Thank you for this interesting consult.  I greatly enjoyed meeting Jerry [name] and look forward to participating in their care.  [name] copy of this report was sent to the requesting provider on this date.  Electronically Jerry [name]: Monia Sabal [name] 03/22/2017, 7:16 AM   I spent [name] total of  30 Minutes   in face to face in clinical consultation, greater than 50% of which was counseling/coordinating care for right lung mass bx

## 2017-03-23 ENCOUNTER — Telehealth: Payer: Self-pay | Admitting: Pulmonary Disease

## 2017-03-23 ENCOUNTER — Encounter: Payer: Self-pay | Admitting: Family Medicine

## 2017-03-23 ENCOUNTER — Telehealth: Payer: Self-pay | Admitting: Family Medicine

## 2017-03-23 ENCOUNTER — Ambulatory Visit (INDEPENDENT_AMBULATORY_CARE_PROVIDER_SITE_OTHER): Payer: Medicare Other | Admitting: Family Medicine

## 2017-03-23 ENCOUNTER — Ambulatory Visit: Payer: Medicare Other | Admitting: Family Medicine

## 2017-03-23 VITALS — BP 138/68 | HR 70 | Temp 98.4°F | Ht 68.0 in | Wt 204.2 lb

## 2017-03-23 DIAGNOSIS — N3281 Overactive bladder: Secondary | ICD-10-CM | POA: Diagnosis not present

## 2017-03-23 DIAGNOSIS — I10 Essential (primary) hypertension: Secondary | ICD-10-CM

## 2017-03-23 DIAGNOSIS — E7849 Other hyperlipidemia: Secondary | ICD-10-CM

## 2017-03-23 DIAGNOSIS — E784 Other hyperlipidemia: Secondary | ICD-10-CM | POA: Diagnosis not present

## 2017-03-23 DIAGNOSIS — Z0000 Encounter for general adult medical examination without abnormal findings: Secondary | ICD-10-CM | POA: Diagnosis not present

## 2017-03-23 DIAGNOSIS — F0390 Unspecified dementia without behavioral disturbance: Secondary | ICD-10-CM

## 2017-03-23 DIAGNOSIS — F03B0 Unspecified dementia, moderate, without behavioral disturbance, psychotic disturbance, mood disturbance, and anxiety: Secondary | ICD-10-CM

## 2017-03-23 NOTE — Patient Instructions (Addendum)
We will call you within a week or two about your referral for home health. If you do not hear within 3 weeks, give [place] a call.    Jerry [name] , Thank you for taking time to come for your Medicare Wellness Visit. I appreciate your ongoing commitment to your health goals. Please review the following plan we discussed and let me know if I can assist you in the future.   May try to schedule an eye exam if he can tolerate  Given resources 24/7 help line  To call Sr. Resources for transportation  Getting helpline  [place] as[place] has wondering and has a GPS system   These are the goals we discussed: Goals    . patient          Alzheimer's Association / Family information and training  https://www.clark-whitaker.org/.asp Loss adjuster, chartered for information  Driving resource center; Can send out driving contract    SAFETY; TeleconferenceOnDemand.fr.asp#howdementiaaffects  [employer]  (please mail donations to this address) [address]  [place], [place] [address] P: [phone] F: [address], [place] [address] P: [phone] F: [address], [address] [place], [place] [address] P: [phone] F: [phone]  Families to call the 800 number to get information on all the resources in the area 24 hour 1 [phone]  Can provide resources; Questions about Dementia; Support groups; Give the caregiver information regarding respite ([facility]rich[facility]) Call the [place] on Aging;as they oversee who gets the grant fund for certain areas (709-628-3662) Also have Tools to help navigate the needs of the patient  Opportunities for free educational programs online 100 support groups [place];  Just starting Early stage program and care partners for the patient and the family 12 weeks of a support group;  12 weeks of social engagement  component End with Educational wrap up [place] the 800 number given for more information  All's connected  Navigator for those more comfortable with navigating the internet and allows one to develop a plan based on resources;  Website and review the educational calander for programs that are available.  They do care consultations with appointments  Packets for physician offices/  Packets physicians can give to newly dx patients Early stage flyers  Also have examples of Safety services and jewelry   [place], [place] [address] P: [phone] F: [phone]  Look up Derrel Nip online; she is an Administrator, arts on memory issues  BankingBets.fi  [employer]; [phone] Management consultant; Information regarding Long Term Care  Diabetes and weight loss; Diabetes [facility]  Phone: [phone]   [place] [facility]; [phone] / call here for transportation in [place] / [place]; [phone] Get resource to get information on any and all community programs for Seniors  [place]: (801) 419-2912 Community Health Response Program -762-263-3354 Public Health Dept; Need to be a skilled visit but can assist with bathing as well; 867-634-5941  Adult center for Enrichment;  Call Senior Line; ([phone])886-9276  Adult day services include Adult Day Care, Adult Day Healthcare, Group Respite, Care Partners, Volunteer In [employer], Education and Support Program   Dept of Social Services; Call ([phone] and ask for SW on call  Options for Medicaid include the Community Alternatives program; [place]-PCS.org (personal care services) or PACE program, which is a medical and social program combined   Caregiver support group and information regarding [place] is at the;  [facility],[employer] Address: [address], [place], [place] [address]  Phone: [phone]   ----           This is a  list of the screening recommended for you and due dates:  Health Maintenance  Topic Date Due  . Tetanus Vaccine  12/26/2016  . Flu Shot  Completed  . Pneumonia vaccines  Completed   Fall Prevention in the Home Falls can cause injuries and can affect people from all age groups. There are many simple things that you can do to make your home safe and to help prevent falls. What can I do on the outside of my home?  Regularly repair the edges of walkways and driveways and fix any cracks.  Remove high doorway thresholds.  Trim any shrubbery on the main path into your home.  Use bright outdoor lighting.  Clear walkways of debris and clutter, including tools and rocks.  Regularly check that handrails are securely fastened and in good repair. Both sides of any steps should have handrails.  Install guardrails along the edges of any raised decks or porches.  Have leaves, snow, and ice cleared regularly.  Use sand or salt on walkways during winter months.  In the garage, clean up any spills right away, including grease or oil spills. What can I do in the bathroom?  Use night lights.  Install grab bars by the toilet and in the tub and shower. Do not use towel bars as grab bars.  Use non-skid mats or decals on the floor of the tub or shower.  If you need to sit down while you are in the shower, use a plastic, non-slip stool.  Keep the floor dry. Immediately clean up any water that spills on the floor.  Remove soap buildup in the tub or shower on a regular basis.  Attach bath mats securely with double-sided non-slip rug tape.  Remove throw rugs and other tripping hazards from the floor. What can I do in the bedroom?  Use night lights.  Make sure that a bedside light is easy to reach.  Do not use oversized bedding that drapes onto the floor.  Have a firm chair that has side arms to use for getting dressed.  Remove throw rugs and other tripping hazards from the floor. What  can I do in the kitchen?  Clean up any spills right away.  Avoid walking on wet floors.  Place frequently used items in easy-to-reach places.  If you need to reach for something above you, use a sturdy step stool that has a grab bar.  Keep electrical cables out of the way.  Do not use floor polish or wax that makes floors slippery. If you have to use wax, make sure that it is non-skid floor wax.  Remove throw rugs and other tripping hazards from the floor. What can I do in the stairways?  Do not leave any items on the stairs.  Make sure that there are handrails on both sides of the stairs. Fix handrails that are broken or loose. Make sure that handrails are as long as the stairways.  Check any carpeting to make sure that it is firmly attached to the stairs. Fix any carpet that is loose or worn.  Avoid having throw rugs at the top or bottom of stairways, or secure the rugs with carpet tape to prevent them from moving.  Make sure that you have a light switch at the top of the stairs and  the bottom of the stairs. If you do not have them, have them installed. What are some other fall prevention tips?  Wear closed-toe shoes that fit well and support your feet. Wear shoes that have rubber soles or low heels.  When you use a stepladder, make sure that it is completely opened and that the sides are firmly locked. Have someone hold the ladder while you are using it. Do not climb a closed stepladder.  Add color or contrast paint or tape to grab bars and handrails in your home. Place contrasting color strips on the first and last steps.  Use mobility aids as needed, such as canes, walkers, scooters, and crutches.  Turn on lights if it is dark. Replace any light bulbs that burn out.  Set up furniture so that there are clear paths. Keep the furniture in the same spot.  Fix any uneven floor surfaces.  Choose a carpet design that does not hide the edge of steps of a stairway.  Be aware  of any and all pets.  Review your medicines with your healthcare provider. Some medicines can cause dizziness or changes in blood pressure, which increase your risk of falling. Talk with your health care provider about other ways that you can decrease your risk of falls. This may include working with a physical therapist or trainer to improve your strength, balance, and endurance. This information is not intended to replace advice given to you by your health care provider. Make sure you discuss any questions you have with your health care provider. Document Released: 12/02/2002 Document Revised: 05/10/2016 Document Reviewed: 01/16/2015 Elsevier Interactive Patient Education  2017 [employer].

## 2017-03-23 NOTE — Telephone Encounter (Signed)
Pt did have the procedure on Wednesday as scheduled.  Will sign off of message at this time.

## 2017-03-23 NOTE — Telephone Encounter (Signed)
Called and spoke to pt's daughter, Cristopher Estimable. She is requesting the results of pt's CT biopsy from yesterday 3.28.18. She is aware that AD is unavailable until next week and once he responds we will call he back. She verbalized understanding.  Dr. Corrie Dandy please advise. Thanks.
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
## 2017-03-23 NOTE — Progress Notes (Signed)
Subjective:  Jerry [name] is a 81 y.o. year old very pleasant male patient who presents for/with See problem oriented charting ROS- denies any chest pain or shortness of breath. Admits to frustration due to feeling like family is having to care for him more.    Past Medical History-  Patient Active Problem List   Diagnosis Date Noted  . Moderate dementia without behavioral disturbance 07/17/2015    Priority: High  . Hyperlipidemia 10/15/2014    Priority: Medium  . Gout of big toe 08/04/2008    Priority: Medium  . Essential hypertension 07/25/2007    Priority: Medium  . PROSTATE CANCER, HX OF 07/25/2007    Priority: Medium  . Allergic rhinitis 10/15/2014    Priority: Low  . Overactive bladder 10/15/2014    Priority: Low  . CARDIAC MURMUR 01/10/2011    Priority: Low  . ORGANIC IMPOTENCE 08/12/2010    Priority: Low  . Exertional dyspnea 03/15/2017  . Pulmonary emphysema ([place])   . Parotiditis 02/08/2017  . Facial swelling 02/08/2017  . CKD (chronic kidney disease) stage 3, GFR 30-59 ml/min 02/08/2017  . Lung nodule 02/08/2017  . Submandibular space infection   . Memory loss 05/08/2015    Medications- reviewed and updated Current Outpatient Prescriptions  Medication Sig Dispense Refill  . amLODipine-olmesartan (AZOR) 5-40 MG tablet Take 1 tablet by mouth daily. (Patient taking differently: Take 1 tablet by mouth every morning. ) 30 tablet 5  . atorvastatin (LIPITOR) 40 MG tablet Take 1 tablet (40 mg total) by mouth daily. (Patient taking differently: Take 40 mg by mouth every morning. ) 30 tablet 5  . donepezil (ARICEPT) 5 MG tablet Take once a day (Patient taking differently: Take 5 mg by mouth every morning. Take once a day) 30 tablet 5   No current facility-administered medications for this visit.     Objective: BP 138/68 (BP Location: Left Arm, Patient Position: Sitting, Cuff Size: Large)   Pulse 70   Temp 98.4 F (36.9 C) (Oral)   Ht '5\' 8"'$  (1.727 m)   Wt 204 lb  3.2 oz (92.6 kg)   SpO2 96%   BMI 31.05 kg/m  Gen: NAD, resting comfortably CV: RRR stable murmur Lungs: CTAB no crackles, wheeze, rhonchi Abdomen: soft/nontender/nondistended/normal bowel sounds. No rebound or guarding.  Ext: no edema Skin: warm, dry  Assessment/Plan:  Overactive bladder vesicare- now off due to anticholinergic effects  Moderate dementia without behavioral disturbance S: Daughter states pt is in needs of home health nursing/aid for his onset dementia. Pt is forgetting to eat, take his meds,and now unable to drive. License not valid at present- has cognitive and driving test. $301 charge at least daughter has other syblings that are not able to check on pt on a regular basis.   She lives out of state, but anytime you can get this set up is great.Algis Downs  Has daughter that cannot drive regularly and other daughter works a lot- needs support. Hard to get a schedule give constraints. Thankfully his plan does cover up to 30 hours a week of home care. No transport assist.   We are hoping for help with pill management, help with feeding and cooking. One daughter in town does set up Pill box but often times 2-3 days get missed in a week.   A/P: MMSE is 16/30 (5 off for orientation to time, 1 off for orientation to place, 4 off for attention and calculation, 3 off for delayed recall, 1 of ffor command close your  eyes). Continue aricept. Did not tolerate namenda- very dizzy  I do think patient would benefit from evaluation by RN at home and home health aide- not sure long term how much nursing would be needed but assist with meals at least 2x a day could be very helpful.   Essential hypertension S: controlled on Amlodipine-olmesartan 5-'40mg'$ ..  BP Readings from Last 3 Encounters:  03/23/17 138/68  03/22/17 125/65  03/15/17 118/78  A/P:Continue current meds:  Doing well    Hyperlipidemia S: poorly controlled on atorvastatin '40mg'$  on last check. No myalgias.  Lab  Results  Component Value Date   CHOL 203 (H) 08/01/2016   HDL 33.40 (L) 08/01/2016   LDLCALC 150 (H) 08/01/2016   TRIG 102.0 08/01/2016   CHOLHDL 6 08/01/2016   A/P: suspect this is a compliance issue- getting pill box set up regularly may help. On the other hand- could consider coming off of this as only for primary prevention and now with dementia as well as likely lung cancer (we discussed possible lung cancer but they are awaiting call from pulmonology and likely in person visit)    No Follow-up on file.  Orders Placed This Encounter  Procedures  . Ambulatory referral to Home Health    Referral Priority:   Routine    Referral Type:   Home Health Care    Referral Reason:   Specialty Services Required    Requested Specialty:   [place]    Number of Visits Requested:   1  Please evaluate Jerry [name] for admission to [facility].  Disciplines requested: Nursing and Goodell to provide: Evaluate and Other: hopeful to help with at least meal prep twice a day to make sure he is eating. Also pill box set up at least weekly and reminders to take his pills due to questions of compliance.   Physician to follow patient's care (the person listed here will be responsible for signing ongoing orders): PCP  Requested Start of Care Date: Next Week  I certify that this patient is under my care and that I, or a Nurse Practitioner or Physician's Assistant working with me, had a face-to-face encounter that meets the physician face-to-face requirements with patient on 03/24/17. The encounter with the patient was in whole, or in part for the following medical condition(s) which is the primary reason for home health care (List medical condition). alzheimers dementia   Return precautions advised.  Garret Reddish, MD

## 2017-03-23 NOTE — Telephone Encounter (Signed)
error 

## 2017-03-23 NOTE — Progress Notes (Signed)
Pre visit review using our clinic review tool, if applicable. No additional management support is needed unless otherwise documented below in the visit note. 

## 2017-03-24 NOTE — Assessment & Plan Note (Signed)
S: poorly controlled on atorvastatin '40mg'$  on last check. No myalgias.  Lab Results  Component Value Date   CHOL 203 (H) 08/01/2016   HDL 33.40 (L) 08/01/2016   LDLCALC 150 (H) 08/01/2016   TRIG 102.0 08/01/2016   CHOLHDL 6 08/01/2016   A/P: suspect this is a compliance issue- getting pill box set up regularly may help. On the other hand- could consider coming off of this as only for primary prevention and now with dementia as well as likely lung cancer (we discussed possible lung cancer but they are awaiting call from pulmonology and likely in person visit)

## 2017-03-24 NOTE — Assessment & Plan Note (Signed)
S: controlled on Amlodipine-olmesartan 5-'40mg'$ ..  BP Readings from Last 3 Encounters:  03/23/17 138/68  03/22/17 125/65  03/15/17 118/78  A/P:Continue current meds:  Doing well

## 2017-03-24 NOTE — Assessment & Plan Note (Addendum)
S: Daughter states pt is in needs of home health nursing/aid for his onset dementia. Pt is forgetting to eat, take his meds,and now unable to drive. License not valid at present- has cognitive and driving test. $151 charge at least daughter has other syblings that are not able to check on pt on a regular basis.   She lives out of state, but anytime you can get this set up is great.Algis Downs  Has daughter that cannot drive regularly and other daughter works a lot- needs support. Hard to get a schedule give constraints. Thankfully his plan does cover up to 30 hours a week of home care. No transport assist.   We are hoping for help with pill management, help with feeding and cooking. One daughter in town does set up Pill box but often times 2-3 days get missed in a week.   A/P: MMSE is 16/30 (5 off for orientation to time, 1 off for orientation to place, 4 off for attention and calculation, 3 off for delayed recall, 1 of ffor command close your eyes). Continue aricept. Did not tolerate namenda- very dizzy  I do think patient would benefit from evaluation by RN at home and home health aide- not sure long term how much nursing would be needed but assist with meals at least 2x a day could be very helpful.

## 2017-03-24 NOTE — Assessment & Plan Note (Signed)
vesicare- now off due to anticholinergic effects

## 2017-03-26 DIAGNOSIS — C3490 Malignant neoplasm of unspecified part of unspecified bronchus or lung: Secondary | ICD-10-CM

## 2017-03-26 HISTORY — DX: Malignant neoplasm of unspecified part of unspecified bronchus or lung: C34.90

## 2017-03-28 ENCOUNTER — Other Ambulatory Visit: Payer: Self-pay | Admitting: Family Medicine

## 2017-03-28 NOTE — Telephone Encounter (Signed)
I just called [name] now. No answer. Left a VM. She can try calling me tomorrow at noon. Thanks.   Monica Becton, MD 03/28/2017, 5:46 PM [facility] Pager [phone] After 3 pm or if no answer, call [phone]

## 2017-03-28 NOTE — Telephone Encounter (Signed)
[name] is returning phone call...contact # [phone].Jerry [name]

## 2017-03-28 NOTE — Telephone Encounter (Signed)
Spoke with pt's daughter Cristopher Estimable, who is requesting that AD call her at [phone] with pt's bx results.  AD please advise. Thnaks.

## 2017-03-29 ENCOUNTER — Telehealth: Payer: Self-pay | Admitting: Pulmonary Disease

## 2017-03-29 ENCOUNTER — Telehealth: Payer: Self-pay | Admitting: *Deleted

## 2017-03-29 DIAGNOSIS — C801 Malignant (primary) neoplasm, unspecified: Secondary | ICD-10-CM

## 2017-03-29 NOTE — Telephone Encounter (Signed)
Oncology will call the pt with the appt Jerry [name]

## 2017-03-29 NOTE — Telephone Encounter (Signed)
   Biopsy of the right upper lung nodule came back positive for adenocarcinoma. Further stains are being done. I called up the patient and discuss with him results. I also discussed results with patient's daughter, Ren Aspinall.  Plan to consult oncology. Patient and daughter are aware.   Jasmine : can we consult Oncology Dr. Lorna Few to be seen urgently if possible?  pls let me know when appointment will be.   Monica Becton, MD 03/29/2017, 10:06 AM [place] [facility] Pager [phone] After 3 pm or if no answer, call [phone]

## 2017-03-29 NOTE — Telephone Encounter (Signed)
Daughter calling back her phone got disconnected said they will see you friday

## 2017-03-29 NOTE — Telephone Encounter (Signed)
done

## 2017-03-29 NOTE — Telephone Encounter (Signed)
Spoke with the pt's daughter  She states that she moved up pt's appt to Friday so will discuss then  Nothing further needed

## 2017-03-29 NOTE — Telephone Encounter (Signed)
   I just spoke with the pts daughter (again).  No need to be seen this Friday. pls cancel appointment.  They will call back if they need to be seen later on.   Thanks.   Monica Becton, MD 03/29/2017, 1:17 PM [place] Pager [phone] After 3 pm or if no answer, call [phone]

## 2017-03-29 NOTE — Telephone Encounter (Signed)
PCC's   Order was placed please see AD's previous message and let us know the dates. Thanks

## 2017-03-29 NOTE — Telephone Encounter (Signed)
AD- do you still want to see him 4/6 Looks like you already spoke with the dtr and referral to Earlie Server has been made  Please advise, thanks!

## 2017-03-29 NOTE — Telephone Encounter (Signed)
Oncology Nurse Navigator Documentation  Oncology Nurse Navigator Flowsheets 03/29/2017  Navigator Location [facility]  Referral date to RadOnc/MedOnc 03/29/2017  Navigator Encounter Type Telephone/I received referral on Jerry [name] today.  I called and updated on appt time and place.   Telephone Outgoing Call  Treatment Phase Pre-Tx/Tx Discussion  Barriers/Navigation Needs Coordination of Care  Interventions Coordination of Care  Coordination of Care Appts  Acuity Level 2  Acuity Level 2 Assistance expediting appointments  Time Spent with Patient 30

## 2017-03-30 ENCOUNTER — Encounter: Payer: Self-pay | Admitting: *Deleted

## 2017-03-30 ENCOUNTER — Ambulatory Visit: Payer: Medicare Other | Admitting: Pulmonary Disease

## 2017-03-30 ENCOUNTER — Telehealth: Payer: Self-pay | Admitting: Family Medicine

## 2017-03-30 DIAGNOSIS — R911 Solitary pulmonary nodule: Secondary | ICD-10-CM | POA: Diagnosis not present

## 2017-03-30 DIAGNOSIS — G309 Alzheimer's disease, unspecified: Secondary | ICD-10-CM | POA: Diagnosis not present

## 2017-03-30 DIAGNOSIS — G8929 Other chronic pain: Secondary | ICD-10-CM | POA: Diagnosis not present

## 2017-03-30 DIAGNOSIS — Z87891 Personal history of nicotine dependence: Secondary | ICD-10-CM | POA: Diagnosis not present

## 2017-03-30 DIAGNOSIS — J439 Emphysema, unspecified: Secondary | ICD-10-CM | POA: Diagnosis not present

## 2017-03-30 DIAGNOSIS — Z9181 History of falling: Secondary | ICD-10-CM | POA: Diagnosis not present

## 2017-03-30 DIAGNOSIS — M10079 Idiopathic gout, unspecified ankle and foot: Secondary | ICD-10-CM | POA: Diagnosis not present

## 2017-03-30 DIAGNOSIS — I129 Hypertensive chronic kidney disease with stage 1 through stage 4 chronic kidney disease, or unspecified chronic kidney disease: Secondary | ICD-10-CM | POA: Diagnosis not present

## 2017-03-30 DIAGNOSIS — M79605 Pain in left leg: Secondary | ICD-10-CM | POA: Diagnosis not present

## 2017-03-30 DIAGNOSIS — M79604 Pain in right leg: Secondary | ICD-10-CM | POA: Diagnosis not present

## 2017-03-30 DIAGNOSIS — E785 Hyperlipidemia, unspecified: Secondary | ICD-10-CM | POA: Diagnosis not present

## 2017-03-30 DIAGNOSIS — N183 Chronic kidney disease, stage 3 (moderate): Secondary | ICD-10-CM | POA: Diagnosis not present

## 2017-03-30 DIAGNOSIS — N3281 Overactive bladder: Secondary | ICD-10-CM | POA: Diagnosis not present

## 2017-03-30 NOTE — Progress Notes (Signed)
Oncology Nurse Navigator Documentation  Oncology Nurse Navigator Flowsheets 03/30/2017  Navigator Location [facility]  Navigator Encounter Type Letter/Fax/Email/Mailed [place] letter   Treatment Phase Pre-Tx/Tx Discussion  Barriers/Navigation Needs Coordination of Care;Education  Education Other  Interventions Coordination of Care  Coordination of Care Appts  Acuity Level 1  Acuity Level 1 Minimal follow up required  Time Spent with Patient 15

## 2017-03-30 NOTE — Telephone Encounter (Signed)
Jerry [name] with [facility] would like verbal orders for skilled nursing  2 wk/ 2  1 wk / 2 PT,  OT  ST and a  social worker eval.

## 2017-03-30 NOTE — Telephone Encounter (Signed)
Yes thanks, ok to provide verbal order

## 2017-03-30 NOTE — Telephone Encounter (Signed)
Left a detailed message for Sharyn Lull from  [facility] to return my call at [phone]

## 2017-03-31 ENCOUNTER — Ambulatory Visit: Payer: Medicare Other | Admitting: Pulmonary Disease

## 2017-03-31 ENCOUNTER — Telehealth: Payer: Self-pay | Admitting: Pulmonary Disease

## 2017-03-31 NOTE — Telephone Encounter (Signed)
Left message on voicemail to call office.  

## 2017-03-31 NOTE — Telephone Encounter (Signed)
Spoke with the pt's daughter  Pt has ENT appt (Dr Janace Hoard) on 4/9  She is asking if AD thinks that this is still necc  Please advise thanks

## 2017-03-31 NOTE — Telephone Encounter (Signed)
Yes. We discussed this several times already.  I have repeatedly told the daughter that the patient needs to follow-up with ENT.  Monica Becton, MD 03/31/2017, 12:23 PM [facility] Pager [phone] After 3 pm or if no answer, call [phone]

## 2017-03-31 NOTE — Telephone Encounter (Signed)
Spoke with pt's daughter [name], aware of recs.  Nothing further needed.

## 2017-04-03 ENCOUNTER — Telehealth: Payer: Self-pay | Admitting: Family Medicine

## 2017-04-03 NOTE — Telephone Encounter (Signed)
Verbal order was provided

## 2017-04-03 NOTE — Telephone Encounter (Signed)
Sharyn Lull with [facility] would like verbal orders for home health skilled nursing. 2 wk / 3 1 wk / 6  Also verbal for PT and OT evals

## 2017-04-03 NOTE — Telephone Encounter (Signed)
Verbal order provided.

## 2017-04-05 ENCOUNTER — Telehealth: Payer: Self-pay | Admitting: *Deleted

## 2017-04-05 NOTE — Telephone Encounter (Signed)
Oncology Nurse Navigator Documentation  Oncology Nurse Navigator Flowsheets 03/30/2017  Navigator Location [facility]  Navigator Encounter Type Letter/Fax/Email/I received notification that T surgery will not be able to come to appt tomorrow.  I called and updated patient.  I spoke with the daughter and updated her of the cancellation and gave him another appt. She verbalized understanding of appt time and place.   Treatment Phase Pre-Tx/Tx Discussion  Barriers/Navigation Needs Coordination of Care;Education  Education Other  Interventions Coordination of Care  Coordination of Care Appts  Acuity Level 1  Acuity Level 1 Minimal follow up required  Time Spent with Patient 15

## 2017-04-06 ENCOUNTER — Ambulatory Visit: Payer: Medicare Other | Admitting: Physical Therapy

## 2017-04-06 DIAGNOSIS — M10079 Idiopathic gout, unspecified ankle and foot: Secondary | ICD-10-CM | POA: Diagnosis not present

## 2017-04-06 DIAGNOSIS — I129 Hypertensive chronic kidney disease with stage 1 through stage 4 chronic kidney disease, or unspecified chronic kidney disease: Secondary | ICD-10-CM | POA: Diagnosis not present

## 2017-04-06 DIAGNOSIS — G8929 Other chronic pain: Secondary | ICD-10-CM | POA: Diagnosis not present

## 2017-04-06 DIAGNOSIS — M79604 Pain in right leg: Secondary | ICD-10-CM | POA: Diagnosis not present

## 2017-04-06 DIAGNOSIS — G309 Alzheimer's disease, unspecified: Secondary | ICD-10-CM | POA: Diagnosis not present

## 2017-04-06 DIAGNOSIS — J439 Emphysema, unspecified: Secondary | ICD-10-CM | POA: Diagnosis not present

## 2017-04-06 DIAGNOSIS — Z9181 History of falling: Secondary | ICD-10-CM | POA: Diagnosis not present

## 2017-04-06 DIAGNOSIS — N3281 Overactive bladder: Secondary | ICD-10-CM | POA: Diagnosis not present

## 2017-04-06 DIAGNOSIS — E785 Hyperlipidemia, unspecified: Secondary | ICD-10-CM | POA: Diagnosis not present

## 2017-04-06 DIAGNOSIS — M79605 Pain in left leg: Secondary | ICD-10-CM | POA: Diagnosis not present

## 2017-04-06 DIAGNOSIS — R911 Solitary pulmonary nodule: Secondary | ICD-10-CM | POA: Diagnosis not present

## 2017-04-06 DIAGNOSIS — Z87891 Personal history of nicotine dependence: Secondary | ICD-10-CM | POA: Diagnosis not present

## 2017-04-06 DIAGNOSIS — N183 Chronic kidney disease, stage 3 (moderate): Secondary | ICD-10-CM | POA: Diagnosis not present

## 2017-04-10 ENCOUNTER — Other Ambulatory Visit: Payer: Self-pay | Admitting: Family Medicine

## 2017-04-10 DIAGNOSIS — E785 Hyperlipidemia, unspecified: Secondary | ICD-10-CM

## 2017-04-12 DIAGNOSIS — F0390 Unspecified dementia without behavioral disturbance: Secondary | ICD-10-CM

## 2017-04-13 ENCOUNTER — Ambulatory Visit: Payer: Medicare Other | Attending: Radiation Oncology | Admitting: Physical Therapy

## 2017-04-13 ENCOUNTER — Ambulatory Visit
Admission: RE | Admit: 2017-04-13 | Discharge: 2017-04-13 | Disposition: A | Discharge status: Home / Self Care | Payer: Medicare Other | Source: Ambulatory Visit | Attending: Radiation Oncology | Admitting: Radiation Oncology

## 2017-04-13 ENCOUNTER — Institutional Professional Consult (permissible substitution) (INDEPENDENT_AMBULATORY_CARE_PROVIDER_SITE_OTHER): Payer: Medicare Other | Admitting: Thoracic Surgery (Cardiothoracic Vascular Surgery)

## 2017-04-13 DIAGNOSIS — R29898 Other symptoms and signs involving the musculoskeletal system: Secondary | ICD-10-CM | POA: Insufficient documentation

## 2017-04-13 DIAGNOSIS — I129 Hypertensive chronic kidney disease with stage 1 through stage 4 chronic kidney disease, or unspecified chronic kidney disease: Secondary | ICD-10-CM | POA: Insufficient documentation

## 2017-04-13 DIAGNOSIS — Z8546 Personal history of malignant neoplasm of prostate: Secondary | ICD-10-CM | POA: Insufficient documentation

## 2017-04-13 DIAGNOSIS — R911 Solitary pulmonary nodule: Secondary | ICD-10-CM

## 2017-04-13 DIAGNOSIS — C3411 Malignant neoplasm of upper lobe, right bronchus or lung: Secondary | ICD-10-CM | POA: Insufficient documentation

## 2017-04-13 DIAGNOSIS — Z510 Encounter for antineoplastic radiation therapy: Secondary | ICD-10-CM | POA: Insufficient documentation

## 2017-04-13 DIAGNOSIS — R293 Abnormal posture: Secondary | ICD-10-CM

## 2017-04-13 DIAGNOSIS — N183 Chronic kidney disease, stage 3 (moderate): Secondary | ICD-10-CM | POA: Insufficient documentation

## 2017-04-13 DIAGNOSIS — C3491 Malignant neoplasm of unspecified part of right bronchus or lung: Secondary | ICD-10-CM

## 2017-04-13 DIAGNOSIS — E7800 Pure hypercholesterolemia, unspecified: Secondary | ICD-10-CM | POA: Insufficient documentation

## 2017-04-13 NOTE — Therapy (Signed)
[place], [place], [address] Phone: [phone]   Fax:  [phone]  Physical Therapy Evaluation  Patient Details  Name: Jerry [name] MRN: [id number] Date of Birth: Feb 13, 1936 Referring Provider: Dr. Gery Pray  Encounter Date: 04/13/2017      PT End of Session - 04/13/17 1634    Visit Number 1   Number of Visits 1   PT Start Time 1324   PT Stop Time 1626   PT Time Calculation (min) 23 min   Activity Tolerance Patient tolerated treatment well   Behavior During Therapy [facility] for tasks assessed/performed      Past Medical History:  Diagnosis Date  . Cancer of prostate ([place]) 2001  . Chronic kidney disease (CKD), stage III (moderate)    [name] 02/08/2017  . Dementia   . DIVERTICULOSIS, COLON 07/25/2007   Qualifier: Diagnosis of  By: [name], LPN, [name]   . High cholesterol   . Hypertension     Past Surgical History:  Procedure Laterality Date  . EYE SURGERY    . INSERTION PROSTATE RADIATION SEED  07/2000   [name] 05/09/2011  . PENILE PROSTHESIS IMPLANT  02/2002   Insertion of Mentor three piece inflatable penile prosthesis./notes 05/09/2011  . PENILE PROSTHESIS PLACEMENT  06/2003   Placement of Mentor rod prosthesis/notes 05/09/2011  . REFRACTIVE SURGERY Bilateral   . REMOVAL OF PENILE PROSTHESIS  06/2003   Removal of three-piece Mentor prosthesis/notes 05/09/2011  . SCROTAL SURGERY  06/2003   Repair of scrotal defect/notes 05/09/2011    There were no vitals filed for this visit.       Subjective Assessment - 04/13/17 1624    Subjective No complaints.  Patient's daughter [name] inquired about getting gym programs covered for him.   Patient is accompained by: Family member  Two daughters   Pertinent History Pt. presented with jaw pain and swelling.  Workup showed a lung nodule.  Subsequent scans and CT biopsy indicated right upper lobe adenocarcinoma and some metabolic activity at  left base of tongue.  He may have resection or SBRT. Ex-smoker who quit 19990.  Prostate cancer s/p prostatectomy with seed implants; chronic kidney disease; dementia.   Patient Stated Goals get info from all lung clinic providers   Currently in Pain? No/denies            [facility] PT Assessment - 04/13/17 0001      Assessment   Medical Diagnosis right upper lobe adenocarcinoma   Referring Provider Dr. Gery Pray   Onset Date/Surgical Date 02/08/17   Prior Therapy none     Precautions   Precautions Other (comment)   Precaution Comments cancer precautions     Restrictions   Weight Bearing Restrictions No     Balance Screen   Has the patient fallen in the past 6 months No   Has the patient had a decrease in activity level because of a fear of falling?  No   Is the patient reluctant to leave their home because of a fear of falling?  No     Home Environment   Living Environment Private residence   Living Arrangements Alone   Type of [place] Two level     Prior Function   Level of Independence Needs assistance with ADLs  daughter helps with medications     Cognition   Overall Cognitive Status Impaired/Different from baseline   Memory Impaired  by report in his medical record  Observation/Other Assessments   Observations quiet, serious-looking gentleman accompanied by two daughters     Functional Tests   Functional tests Sit to Stand     Sit to Stand   Comments 8 times in 30 seconds, below average for age     Posture/Postural Control   Posture/Postural Control Postural limitations   Postural Limitations Forward head;Increased thoracic kyphosis     ROM / Strength   AROM / PROM / Strength AROM     AROM   Overall AROM Comments Trunk AROM in standing:  flexion--reaches fingertips about 6 inches to floor; extension WFL; sidebend WFL bilat.; rotation about 10% liited bilat.     Ambulation/Gait   Ambulation/Gait Yes   Ambulation/Gait Assistance 7:  Independent     Balance   Balance Assessed Yes     Dynamic Standing Balance   Dynamic Standing - Comments reaches forward 9 inches in standing, below average for age                           PT Education - 04/13/17 1633    Education provided Yes   Education Details energy conservation, walking, C[employer] article on staying active, posture, breathing, [employer]) Educated Patient;Child(ren)   Methods Explanation;Handout   Comprehension Verbalized understanding               Lung Clinic Goals - 04/13/17 1641      Patient will be able to verbalize understanding of the benefit of exercise to decrease fatigue.   Status Achieved     Patient will be able to verbalize the importance of posture.   Status Achieved     Patient will be able to demonstrate diaphragmatic breathing for improved lung function.   Status Achieved     Patient will be able to verbalize understanding of the role of physical therapy to prevent functional decline and who to contact if physical therapy is needed.   Status Achieved             Plan - 04/13/17 1634    Clinical Impression Statement This is a quiet gentleman accompanied by his two daughters, one of whom takes notes as the session plays out.  He is diagnosed with right upper lobe adenocarcinoma, and may have resection or SBRT. He has impaired posture, mild limitations in active trunk motions, decreased performance for his age on both forward reach in standing and 30 second sit to stand.  Eval today is low complexity.   Rehab Potential Good   PT Frequency One time visit   PT Treatment/Interventions Patient/family education   PT Next Visit Plan None planned immediately, but patient's daughter inquired about gym program-type options; he may benefit from PT to work on his fitness or from pulmonary rehab.   PT Home Exercise Plan walking, breathing exercise   Consulted and Agree with Plan of Care  Patient;Family member/caregiver      Patient will benefit from skilled therapeutic intervention in order to improve the following deficits and impairments:  Postural dysfunction, Decreased balance, Impaired flexibility, Decreased activity tolerance  Visit Diagnosis: Abnormal posture - Plan: PT plan of care cert/re-cert  Other symptoms and signs involving the musculoskeletal system - Plan: PT plan of care cert/re-cert      G-Codes - 32/67/12 1643    Functional Assessment Tool Used (Outpatient Only) clinical judgement   Functional Limitation Changing and maintaining body position   Changing and  Maintaining Body Position Current Status [phone]) At least 1 percent but less than 20 percent impaired, limited or restricted   Changing and Maintaining Body Position Goal Status (Y6060) At least 1 percent but less than 20 percent impaired, limited or restricted   Changing and Maintaining Body Position Discharge Status (O4599) At least 1 percent but less than 20 percent impaired, limited or restricted       Problem List Patient Active Problem List   Diagnosis Date Noted  . Exertional dyspnea 03/15/2017  . Pulmonary emphysema ([place])   . Parotiditis 02/08/2017  . Facial swelling 02/08/2017  . CKD (chronic kidney disease) stage 3, GFR 30-59 ml/min 02/08/2017  . Lung nodule 02/08/2017  . Submandibular space infection   . Moderate dementia without behavioral disturbance 07/17/2015  . Memory loss 05/08/2015  . Hyperlipidemia 10/15/2014  . Allergic rhinitis 10/15/2014  . Overactive bladder 10/15/2014  . CARDIAC MURMUR 01/10/2011  . ORGANIC IMPOTENCE 08/12/2010  . Gout of big toe 08/04/2008  . Essential hypertension 07/25/2007  . PROSTATE CANCER, HX OF 07/25/2007    Aeon Kessner 04/13/2017, 5:28 PM  [place], [place], [address] Phone: [phone]   Fax:  [phone]  Name: YANDELL MCJUNKINS MRN:  [id number] Date of Birth: [birth year]  Serafina Royals, PT 04/13/17 5:28 PM

## 2017-04-13 NOTE — Progress Notes (Signed)
Radiation Oncology         [phone] ________________________________  Initial Outpatient Consultation  Name: Jerry [name] MRN: [id number]  Date: 04/13/2017  DOB: May 13, 1936  HQ:IONGEXB Yong Channel, MD  de Ranson, [place] A,*   REFERRING PHYSICIAN: de Demorest, [place] A,*  DIAGNOSIS: Clinical stage 1A adenocarcinoma of the right upper lobe  HISTORY OF PRESENT ILLNESS::Jerry [name] is a 81 y.o. male who was seen presented in February of this year with a throat infection requiring hospitalization. The patient was seen by Dr. Janace Hoard during this evaluation. CT scan of the neck performed which showed a right upper lobe  nodule. A subsequent chest CT scan showed a right upper and right lower lobe nodules. A PET scan performed at a later date showed the right upper lobe nodule to be hypermetabolic. Nonspecific hyper metabolism was also noted in the left tongue base area associated with some soft tissue fullness. A subsequent CT-guided biopsy of the right upper lobe lesion was performed which revealed adenocarcinoma. Patient is now seen in the multidisciplinary thoracic clinic for management options   On review of systems, pt denies bloody sputum, appetite suppression, weight loss, pain, or fatigue. Patient has mild-to-moderate dementia but does live by himself.   Of note, pt endorses quitting smoking 35 years ago. He denies any use of chewing tobacco.   PREVIOUS RADIATION THERAPY: Prostate cancer with radioactive seed placement?  PAST MEDICAL HISTORY:  has a past medical history of Cancer of prostate ([place]) (2001); Chronic kidney disease (CKD), stage III (moderate); Dementia; DIVERTICULOSIS, COLON (07/25/2007); High cholesterol; and Hypertension.    PAST SURGICAL HISTORY: Past Surgical History:  Procedure Laterality Date  . EYE SURGERY    . INSERTION PROSTATE RADIATION SEED  07/2000   [name] 05/09/2011  . PENILE PROSTHESIS IMPLANT  02/2002   Insertion of Mentor three piece inflatable  penile prosthesis./notes 05/09/2011  . PENILE PROSTHESIS PLACEMENT  06/2003   Placement of Mentor rod prosthesis/notes 05/09/2011  . REFRACTIVE SURGERY Bilateral   . REMOVAL OF PENILE PROSTHESIS  06/2003   Removal of three-piece Mentor prosthesis/notes 05/09/2011  . SCROTAL SURGERY  06/2003   Repair of scrotal defect/notes 05/09/2011    FAMILY HISTORY: family history includes Alcoholism in his father; Breast cancer in his sister; Early death in his father.  SOCIAL HISTORY:  reports that he quit smoking about 28 years ago. He smoked 0.50 packs per day. He has never used smokeless tobacco. He reports that he does not drink alcohol or use drugs.  ALLERGIES: Namenda [memantine]  MEDICATIONS:  Current Outpatient Prescriptions  Medication Sig Dispense Refill  . amLODipine-olmesartan (AZOR) 5-40 MG tablet TAKE 1 TABLET BY MOUTH DAILY 30 tablet 5  . atorvastatin (LIPITOR) 40 MG tablet TAKE 1 TABLET(40 MG) BY MOUTH DAILY 30 tablet 5  . donepezil (ARICEPT) 5 MG tablet Take once a day (Patient taking differently: Take 5 mg by mouth every morning. Take once a day) 30 tablet 5   No current facility-administered medications for this encounter.     REVIEW OF SYSTEMS:  A 10 point review of systems is documented in the electronic medical record. This was obtained by the nursing staff. However, I reviewed this with the patient to discuss relevant findings and make appropriate changes.      Wt Readings from Last 3 Encounters:  03/23/17 204 lb 3.2 oz (92.6 kg)  03/22/17 200 lb (90.7 kg)  03/15/17 203 lb (92.1 kg)   Temp Readings from Last 3 Encounters:  03/23/17  98.4 F (36.9 C) (Oral)  03/22/17 97.7 F (36.5 C) (Oral)  02/11/17 97.8 F (36.6 C) (Oral)   BP Readings from Last 3 Encounters:  03/23/17 138/68  03/22/17 125/65  03/15/17 118/78   Pulse Readings from Last 3 Encounters:  03/23/17 70  03/22/17 63  03/15/17 93    Physical Exam  General: Alert and oriented, in no acute distress,  accompanied by 2 of his daughters, 77rd daughter from [place] with phone contact HEENT: Head is normocephalic. Extraocular movements are intact. Oropharynx is clear. Palpation along the anterior tongue base reveals no suspicious mass. Neck: Neck is supple, no palpable cervical or supraclavicular lymphadenopathy. Heart: Regular in rate and rhythm with no murmurs, rubs, or gallops. Chest: Clear to auscultation bilaterally, with no rhonchi, wheezes, or rales. Abdomen: Soft, nontender, nondistended, with no rigidity or guarding. Extremities: No cyanosis or edema. Lymphatics: see Neck Exam Skin: No concerning lesions. Musculoskeletal: symmetric strength and muscle tone throughout. Neurologic: Cranial nerves II through XII are grossly intact. No obvious focalities. Speech is fluent. Coordination is intact. Psychiatric: Judgment and insight are intact. Affect is appropriate.   ECOG = 1  LABORATORY DATA:  Lab Results  Component Value Date   WBC 7.1 03/22/2017   HGB 12.7 (L) 03/22/2017   HCT 38.3 (L) 03/22/2017   MCV 88.9 03/22/2017   PLT 209 03/22/2017   NEUTROABS 4.0 03/15/2017   Lab Results  Component Value Date   NA 141 03/15/2017   K 4.3 03/15/2017   CL 109 03/15/2017   CO2 26 03/15/2017   GLUCOSE 98 03/15/2017   CREATININE 1.24 03/15/2017   CALCIUM 9.4 03/15/2017      RADIOGRAPHY: Ct Biopsy  Result Date: 03/22/2017 INDICATION: Indeterminate hypermetabolic right upper lobe pulmonary nodule. Please perform CT-guided right upper lobe pulmonary nodule biopsy for tissue diagnostic purposes. EXAM: CT-GUIDED BIOPSY OF HYPERMETABOLIC RIGHT UPPER LOBE PULMONARY NODULE. COMPARISON:  PET-CT - 03/08/2017; chest CT - 02/08/2017 MEDICATIONS: None. ANESTHESIA/SEDATION: Fentanyl 50 mcg IV; Versed 1 mg IV Sedation time: 20 minutes; The patient was continuously monitored during the procedure by the interventional radiology nurse under my direct supervision. CONTRAST:  None COMPLICATIONS: None  immediate. PROCEDURE: Informed consent was obtained from the patient following an explanation of the procedure, risks, benefits and alternatives. The patient understands,agrees and consents for the procedure. All questions were addressed. A time out was performed prior to the initiation of the procedure. The patient was positioned supine on the CT table and a limited chest CT was performed for procedural planning demonstrating unchanged size of known hypermetabolic slightly lobular appearing approximately 1.4 x 1.0 cm right upper lobe pulmonary nodule (image 14, series 3). The operative site was prepped and draped in the usual sterile fashion. Under sterile conditions and local anesthesia, a 17 gauge coaxial needle was advanced into the peripheral aspect of the nodule. Positioning was confirmed with intermittent CT fluoroscopy and followed by the acquisition of 2 core needle biopsies with an 18 gauge core needle biopsy device. The coaxial needle was removed following deployment of a [employer] plug and superficial hemostasis was achieved with manual compression. Limited post procedural chest CT was negative for pneumothorax or additional complication. A dressing was placed. The patient tolerated the procedure well without immediate postprocedural complication. The patient was escorted to have an upright chest radiograph. IMPRESSION: Technically successful CT guided core needle core biopsy of indeterminate hypermetabolic right upper lobe pulmonary nodule. Electronically Signed   By: Sandi Mariscal M.D.   On: 03/22/2017 11:20  Dg Chest Port 1 View  Result Date: 03/22/2017 CLINICAL DATA:  Status post right upper lobe biopsy EXAM: PORTABLE CHEST 1 VIEW COMPARISON:  Chest CT March 22, 2017 FINDINGS: No pneumothorax. The nodular lesion in the right upper lobe is less well seen by radiography than by CT. Its borders are currently somewhat ill-defined, possibly representing localized hemorrhage in the area of biopsy. Lungs  elsewhere clear. Heart is mildly enlarged with pulmonary vascularity normal. No adenopathy. No bone lesions. IMPRESSION: No pneumothorax. Right upper lobe nodular lesion somewhat ill-defined appearance. Question post biopsy hemorrhage. Lungs elsewhere clear. Stable cardiac prominence. Electronically Signed   By: Lowella Grip III M.D.   On: 03/22/2017 11:23      IMPRESSION: Clinical Stage 1A adenocarcinoma of the lung. Pt has area of uptake on his PET scan on the left base of tongue area which will require further evaluation but is not likely related to his lung lesion. Pt has seen Dr. Janace Hoard for his throat infection and will need to be sent back for further evaluation of this area of uptake in left tongue base. Pt would appear to be good candidate for SBRT. Would anticipate 3 treatments to area of concern. Patient has met with Dr. Roxan Hockey with thoracic surgery today. Patient technically would be a candidate for surgery given his performance status/medical status and location of lesion however given the patient's mild to moderate dementia family members are concerned that he would have difficulty with recuperation and therefore not a good candidate for surgery.  PLAN:  Patient will be set up for stereotactic body radiation therapy. Anticipate 3 treatments directed at the right upper lobe lesion.   ------------------------------------------------  Blair Promise, PhD, MD   This document serves as a record of services personally performed by Gery Pray, MD. It was created on his behalf by Linward Natal, a trained medical scribe. The creation of this record is based on the scribe's personal observations and the provider's statements to them. This document has been checked and approved by the attending provider.
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
## 2017-04-14 ENCOUNTER — Telehealth: Payer: Self-pay | Admitting: *Deleted

## 2017-04-14 ENCOUNTER — Ambulatory Visit: Payer: Medicare Other | Admitting: Adult Health

## 2017-04-14 NOTE — Telephone Encounter (Signed)
CALLED PATIENT TO INFORM OF APPT. WITH DR. BYERS ON 04/18/17 - ARRIVAL TIME - 2 PM WITH DR. BYERS, LVM FOR A RETURN CALL

## 2017-04-17 ENCOUNTER — Telehealth: Payer: Self-pay | Admitting: Oncology

## 2017-04-17 NOTE — Telephone Encounter (Signed)
[name] called and asked if Jerry [name] needs to eat anything special or not wear deoderant for tomorrow's CT [facility] appointment.  Advised her that he can eat anything he wants and can wear deoderant to the appointment.

## 2017-04-18 ENCOUNTER — Ambulatory Visit
Admission: RE | Admit: 2017-04-18 | Discharge: 2017-04-18 | Disposition: A | Discharge status: Home / Self Care | Payer: Medicare Other | Source: Ambulatory Visit | Attending: Radiation Oncology | Admitting: Radiation Oncology

## 2017-04-18 DIAGNOSIS — E7800 Pure hypercholesterolemia, unspecified: Secondary | ICD-10-CM | POA: Diagnosis not present

## 2017-04-18 DIAGNOSIS — I129 Hypertensive chronic kidney disease with stage 1 through stage 4 chronic kidney disease, or unspecified chronic kidney disease: Secondary | ICD-10-CM | POA: Diagnosis not present

## 2017-04-18 DIAGNOSIS — R911 Solitary pulmonary nodule: Secondary | ICD-10-CM

## 2017-04-18 DIAGNOSIS — Z510 Encounter for antineoplastic radiation therapy: Secondary | ICD-10-CM | POA: Diagnosis not present

## 2017-04-18 DIAGNOSIS — Z8546 Personal history of malignant neoplasm of prostate: Secondary | ICD-10-CM | POA: Diagnosis not present

## 2017-04-18 DIAGNOSIS — N183 Chronic kidney disease, stage 3 (moderate): Secondary | ICD-10-CM | POA: Diagnosis not present

## 2017-04-18 DIAGNOSIS — C3411 Malignant neoplasm of upper lobe, right bronchus or lung: Secondary | ICD-10-CM | POA: Diagnosis not present

## 2017-04-18 NOTE — Progress Notes (Signed)
  Radiation Oncology         [phone] ________________________________  Name: Jerry [name] MRN: [id number]  Date: 04/18/2017  DOB: 12/10/1936   STEREOTACTIC BODY RADIOTHERAPY SIMULATION AND TREATMENT PLANNING NOTE   DIAGNOSIS: Clinical stage 1A adenocarcinoma of the right upper lobe   NARRATIVE:  The patient was brought to the [place] suite.  Identity was confirmed.  All relevant records and images related to the planned course of therapy were reviewed.  The patient freely provided informed written consent to proceed with treatment after reviewing the details related to the planned course of therapy. The consent form was witnessed and verified by the simulation staff.  Then, the patient was set-up in a stable reproducible  supine position for radiation therapy.  A BodyFix immobilization pillow was fabricated for reproducible positioning.  Then I personally applied the abdominal compression paddle to limit respiratory excursion.  4D respiratoy motion management CT images were obtained.  Surface markings were placed.  The CT images were loaded into the planning software.  Then, using Cine, MIP, and standard views, the internal target volume (ITV) and planning target volumes (PTV) were delinieated, and avoidance structures were contoured.  Treatment planning then occurred.  The radiation prescription was entered and confirmed.  A total of two complex treatment devices were fabricated in the form of the BodyFix immobilization pillow and a neck accuform cushion.  I have requested : 3D Simulation  I have requested a DVH of the following structures: Heart, Lungs, Esophagus, Chest Wall, Brachial Plexus, Major Blood Vessels, and targets.  PLAN:  The patient will receive 54 Gy in 3 fractions.  -----------------------------------  Blair Promise, PhD, Ms
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
## 2017-04-19 DIAGNOSIS — Z87891 Personal history of nicotine dependence: Secondary | ICD-10-CM | POA: Diagnosis not present

## 2017-04-19 DIAGNOSIS — R911 Solitary pulmonary nodule: Secondary | ICD-10-CM | POA: Diagnosis not present

## 2017-04-19 DIAGNOSIS — M10079 Idiopathic gout, unspecified ankle and foot: Secondary | ICD-10-CM | POA: Diagnosis not present

## 2017-04-19 DIAGNOSIS — G309 Alzheimer's disease, unspecified: Secondary | ICD-10-CM | POA: Diagnosis not present

## 2017-04-19 DIAGNOSIS — J439 Emphysema, unspecified: Secondary | ICD-10-CM | POA: Diagnosis not present

## 2017-04-19 DIAGNOSIS — M79604 Pain in right leg: Secondary | ICD-10-CM | POA: Diagnosis not present

## 2017-04-19 DIAGNOSIS — I129 Hypertensive chronic kidney disease with stage 1 through stage 4 chronic kidney disease, or unspecified chronic kidney disease: Secondary | ICD-10-CM | POA: Diagnosis not present

## 2017-04-19 DIAGNOSIS — M79605 Pain in left leg: Secondary | ICD-10-CM | POA: Diagnosis not present

## 2017-04-19 DIAGNOSIS — E785 Hyperlipidemia, unspecified: Secondary | ICD-10-CM | POA: Diagnosis not present

## 2017-04-19 DIAGNOSIS — G8929 Other chronic pain: Secondary | ICD-10-CM | POA: Diagnosis not present

## 2017-04-19 DIAGNOSIS — N3281 Overactive bladder: Secondary | ICD-10-CM | POA: Diagnosis not present

## 2017-04-19 DIAGNOSIS — N183 Chronic kidney disease, stage 3 (moderate): Secondary | ICD-10-CM | POA: Diagnosis not present

## 2017-04-21 DIAGNOSIS — R911 Solitary pulmonary nodule: Secondary | ICD-10-CM | POA: Diagnosis not present

## 2017-04-21 DIAGNOSIS — G309 Alzheimer's disease, unspecified: Secondary | ICD-10-CM | POA: Diagnosis not present

## 2017-04-21 DIAGNOSIS — Z87891 Personal history of nicotine dependence: Secondary | ICD-10-CM | POA: Diagnosis not present

## 2017-04-21 DIAGNOSIS — E785 Hyperlipidemia, unspecified: Secondary | ICD-10-CM | POA: Diagnosis not present

## 2017-04-21 DIAGNOSIS — N183 Chronic kidney disease, stage 3 (moderate): Secondary | ICD-10-CM | POA: Diagnosis not present

## 2017-04-21 DIAGNOSIS — M79604 Pain in right leg: Secondary | ICD-10-CM | POA: Diagnosis not present

## 2017-04-21 DIAGNOSIS — M10079 Idiopathic gout, unspecified ankle and foot: Secondary | ICD-10-CM | POA: Diagnosis not present

## 2017-04-21 DIAGNOSIS — G8929 Other chronic pain: Secondary | ICD-10-CM | POA: Diagnosis not present

## 2017-04-21 DIAGNOSIS — J439 Emphysema, unspecified: Secondary | ICD-10-CM | POA: Diagnosis not present

## 2017-04-21 DIAGNOSIS — M79605 Pain in left leg: Secondary | ICD-10-CM | POA: Diagnosis not present

## 2017-04-21 DIAGNOSIS — I129 Hypertensive chronic kidney disease with stage 1 through stage 4 chronic kidney disease, or unspecified chronic kidney disease: Secondary | ICD-10-CM | POA: Diagnosis not present

## 2017-04-21 DIAGNOSIS — N3281 Overactive bladder: Secondary | ICD-10-CM | POA: Diagnosis not present

## 2017-04-24 DIAGNOSIS — C3411 Malignant neoplasm of upper lobe, right bronchus or lung: Secondary | ICD-10-CM | POA: Diagnosis not present

## 2017-04-24 DIAGNOSIS — N183 Chronic kidney disease, stage 3 (moderate): Secondary | ICD-10-CM | POA: Diagnosis not present

## 2017-04-24 DIAGNOSIS — I129 Hypertensive chronic kidney disease with stage 1 through stage 4 chronic kidney disease, or unspecified chronic kidney disease: Secondary | ICD-10-CM | POA: Diagnosis not present

## 2017-04-24 DIAGNOSIS — E7800 Pure hypercholesterolemia, unspecified: Secondary | ICD-10-CM | POA: Diagnosis not present

## 2017-04-24 DIAGNOSIS — Z510 Encounter for antineoplastic radiation therapy: Secondary | ICD-10-CM | POA: Diagnosis not present

## 2017-04-25 DIAGNOSIS — N3281 Overactive bladder: Secondary | ICD-10-CM | POA: Diagnosis not present

## 2017-04-25 DIAGNOSIS — E785 Hyperlipidemia, unspecified: Secondary | ICD-10-CM | POA: Diagnosis not present

## 2017-04-25 DIAGNOSIS — G309 Alzheimer's disease, unspecified: Secondary | ICD-10-CM | POA: Diagnosis not present

## 2017-04-25 DIAGNOSIS — M79605 Pain in left leg: Secondary | ICD-10-CM | POA: Diagnosis not present

## 2017-04-25 DIAGNOSIS — N183 Chronic kidney disease, stage 3 (moderate): Secondary | ICD-10-CM | POA: Diagnosis not present

## 2017-04-25 DIAGNOSIS — Z9181 History of falling: Secondary | ICD-10-CM | POA: Diagnosis not present

## 2017-04-25 DIAGNOSIS — G8929 Other chronic pain: Secondary | ICD-10-CM | POA: Diagnosis not present

## 2017-04-25 DIAGNOSIS — R911 Solitary pulmonary nodule: Secondary | ICD-10-CM | POA: Diagnosis not present

## 2017-04-25 DIAGNOSIS — Z87891 Personal history of nicotine dependence: Secondary | ICD-10-CM | POA: Diagnosis not present

## 2017-04-25 DIAGNOSIS — I129 Hypertensive chronic kidney disease with stage 1 through stage 4 chronic kidney disease, or unspecified chronic kidney disease: Secondary | ICD-10-CM | POA: Diagnosis not present

## 2017-04-25 DIAGNOSIS — J439 Emphysema, unspecified: Secondary | ICD-10-CM | POA: Diagnosis not present

## 2017-04-25 DIAGNOSIS — M10079 Idiopathic gout, unspecified ankle and foot: Secondary | ICD-10-CM | POA: Diagnosis not present

## 2017-04-25 DIAGNOSIS — M79604 Pain in right leg: Secondary | ICD-10-CM | POA: Diagnosis not present

## 2017-04-26 DIAGNOSIS — N183 Chronic kidney disease, stage 3 (moderate): Secondary | ICD-10-CM | POA: Diagnosis not present

## 2017-04-26 DIAGNOSIS — I129 Hypertensive chronic kidney disease with stage 1 through stage 4 chronic kidney disease, or unspecified chronic kidney disease: Secondary | ICD-10-CM | POA: Diagnosis not present

## 2017-04-26 DIAGNOSIS — C3411 Malignant neoplasm of upper lobe, right bronchus or lung: Secondary | ICD-10-CM | POA: Diagnosis not present

## 2017-04-26 DIAGNOSIS — Z510 Encounter for antineoplastic radiation therapy: Secondary | ICD-10-CM | POA: Diagnosis not present

## 2017-04-26 DIAGNOSIS — E7800 Pure hypercholesterolemia, unspecified: Secondary | ICD-10-CM | POA: Diagnosis not present

## 2017-05-01 ENCOUNTER — Ambulatory Visit
Admission: RE | Admit: 2017-05-01 | Discharge: 2017-05-01 | Disposition: A | Discharge status: Home / Self Care | Payer: Medicare Other | Source: Ambulatory Visit | Attending: Radiation Oncology | Admitting: Radiation Oncology

## 2017-05-01 DIAGNOSIS — N183 Chronic kidney disease, stage 3 (moderate): Secondary | ICD-10-CM | POA: Diagnosis not present

## 2017-05-01 DIAGNOSIS — C3411 Malignant neoplasm of upper lobe, right bronchus or lung: Secondary | ICD-10-CM | POA: Diagnosis not present

## 2017-05-01 DIAGNOSIS — E7800 Pure hypercholesterolemia, unspecified: Secondary | ICD-10-CM | POA: Diagnosis not present

## 2017-05-01 DIAGNOSIS — I129 Hypertensive chronic kidney disease with stage 1 through stage 4 chronic kidney disease, or unspecified chronic kidney disease: Secondary | ICD-10-CM | POA: Diagnosis not present

## 2017-05-01 DIAGNOSIS — Z510 Encounter for antineoplastic radiation therapy: Secondary | ICD-10-CM | POA: Diagnosis not present

## 2017-05-02 ENCOUNTER — Ambulatory Visit: Payer: Medicare Other

## 2017-05-02 DIAGNOSIS — M79605 Pain in left leg: Secondary | ICD-10-CM | POA: Diagnosis not present

## 2017-05-02 DIAGNOSIS — N3281 Overactive bladder: Secondary | ICD-10-CM | POA: Diagnosis not present

## 2017-05-02 DIAGNOSIS — G309 Alzheimer's disease, unspecified: Secondary | ICD-10-CM | POA: Diagnosis not present

## 2017-05-02 DIAGNOSIS — Z87891 Personal history of nicotine dependence: Secondary | ICD-10-CM | POA: Diagnosis not present

## 2017-05-02 DIAGNOSIS — J439 Emphysema, unspecified: Secondary | ICD-10-CM | POA: Diagnosis not present

## 2017-05-02 DIAGNOSIS — R911 Solitary pulmonary nodule: Secondary | ICD-10-CM | POA: Diagnosis not present

## 2017-05-02 DIAGNOSIS — I129 Hypertensive chronic kidney disease with stage 1 through stage 4 chronic kidney disease, or unspecified chronic kidney disease: Secondary | ICD-10-CM | POA: Diagnosis not present

## 2017-05-02 DIAGNOSIS — M10079 Idiopathic gout, unspecified ankle and foot: Secondary | ICD-10-CM | POA: Diagnosis not present

## 2017-05-02 DIAGNOSIS — M79604 Pain in right leg: Secondary | ICD-10-CM | POA: Diagnosis not present

## 2017-05-02 DIAGNOSIS — E785 Hyperlipidemia, unspecified: Secondary | ICD-10-CM | POA: Diagnosis not present

## 2017-05-02 DIAGNOSIS — N183 Chronic kidney disease, stage 3 (moderate): Secondary | ICD-10-CM | POA: Diagnosis not present

## 2017-05-02 DIAGNOSIS — G8929 Other chronic pain: Secondary | ICD-10-CM | POA: Diagnosis not present

## 2017-05-02 DIAGNOSIS — Z9181 History of falling: Secondary | ICD-10-CM | POA: Diagnosis not present

## 2017-05-03 ENCOUNTER — Telehealth: Payer: Self-pay | Admitting: *Deleted

## 2017-05-03 ENCOUNTER — Ambulatory Visit: Payer: Medicare Other

## 2017-05-03 NOTE — Telephone Encounter (Signed)
Oncology Nurse Navigator Documentation  Oncology Nurse Navigator Flowsheets 05/03/2017  Navigator Location [facility]  Navigator Encounter Type Telephone/I called to follow up with Jerry [name] to see how his treatment is going.  I spoke with his daughter and she states he is doing well.  I listened as she explained.  She asked questions about side effects and I updated.  She was thankful for the call.   Telephone Outgoing Call  Abnormal Finding Date 02/08/2017  Confirmed Diagnosis Date 03/22/2017  Treatment Initiated Date 05/01/2017  Patient Visit Type Other  Treatment Phase Treatment  Barriers/Navigation Needs Education  Education Other  Interventions Education  Education Method Verbal  Acuity Level 1  Time Spent with Patient 30

## 2017-05-04 ENCOUNTER — Telehealth: Payer: Self-pay | Admitting: Oncology

## 2017-05-04 ENCOUNTER — Ambulatory Visit: Payer: Medicare Other

## 2017-05-04 NOTE — Telephone Encounter (Addendum)
Patient's daughter, [name] called and asked about the cyst on her dad's tongue.  She said he is seeing Dr. Janace Hoard for this.  She is wondering if it is being treated at the same time or if they will need to follow up with Dr. Janace Hoard.  Advised her that we are treating his chest with radiation and that they will need to follow up with Dr. Janace Hoard.  [name] verbalized agreement and said that after talking about it, she now remembers that they are supposed to follow up with ENT.

## 2017-05-05 ENCOUNTER — Ambulatory Visit
Admission: RE | Admit: 2017-05-05 | Discharge: 2017-05-05 | Disposition: A | Discharge status: Home / Self Care | Payer: Medicare Other | Source: Ambulatory Visit | Attending: Radiation Oncology | Admitting: Radiation Oncology

## 2017-05-05 DIAGNOSIS — Z510 Encounter for antineoplastic radiation therapy: Secondary | ICD-10-CM | POA: Diagnosis not present

## 2017-05-05 DIAGNOSIS — E7800 Pure hypercholesterolemia, unspecified: Secondary | ICD-10-CM | POA: Diagnosis not present

## 2017-05-05 DIAGNOSIS — I129 Hypertensive chronic kidney disease with stage 1 through stage 4 chronic kidney disease, or unspecified chronic kidney disease: Secondary | ICD-10-CM | POA: Diagnosis not present

## 2017-05-05 DIAGNOSIS — C3411 Malignant neoplasm of upper lobe, right bronchus or lung: Secondary | ICD-10-CM | POA: Diagnosis not present

## 2017-05-05 DIAGNOSIS — N183 Chronic kidney disease, stage 3 (moderate): Secondary | ICD-10-CM | POA: Diagnosis not present

## 2017-05-08 DIAGNOSIS — N183 Chronic kidney disease, stage 3 (moderate): Secondary | ICD-10-CM | POA: Diagnosis not present

## 2017-05-08 DIAGNOSIS — G309 Alzheimer's disease, unspecified: Secondary | ICD-10-CM | POA: Diagnosis not present

## 2017-05-08 DIAGNOSIS — I129 Hypertensive chronic kidney disease with stage 1 through stage 4 chronic kidney disease, or unspecified chronic kidney disease: Secondary | ICD-10-CM | POA: Diagnosis not present

## 2017-05-08 DIAGNOSIS — J439 Emphysema, unspecified: Secondary | ICD-10-CM | POA: Diagnosis not present

## 2017-05-08 DIAGNOSIS — M10079 Idiopathic gout, unspecified ankle and foot: Secondary | ICD-10-CM | POA: Diagnosis not present

## 2017-05-08 DIAGNOSIS — G8929 Other chronic pain: Secondary | ICD-10-CM | POA: Diagnosis not present

## 2017-05-08 DIAGNOSIS — R911 Solitary pulmonary nodule: Secondary | ICD-10-CM | POA: Diagnosis not present

## 2017-05-08 DIAGNOSIS — E785 Hyperlipidemia, unspecified: Secondary | ICD-10-CM | POA: Diagnosis not present

## 2017-05-08 DIAGNOSIS — Z87891 Personal history of nicotine dependence: Secondary | ICD-10-CM | POA: Diagnosis not present

## 2017-05-08 DIAGNOSIS — M79604 Pain in right leg: Secondary | ICD-10-CM | POA: Diagnosis not present

## 2017-05-08 DIAGNOSIS — Z9181 History of falling: Secondary | ICD-10-CM | POA: Diagnosis not present

## 2017-05-08 DIAGNOSIS — N3281 Overactive bladder: Secondary | ICD-10-CM | POA: Diagnosis not present

## 2017-05-08 DIAGNOSIS — M79605 Pain in left leg: Secondary | ICD-10-CM | POA: Diagnosis not present

## 2017-05-09 ENCOUNTER — Ambulatory Visit
Admission: RE | Admit: 2017-05-09 | Discharge: 2017-05-09 | Disposition: A | Discharge status: Home / Self Care | Payer: Medicare Other | Source: Ambulatory Visit | Attending: Radiation Oncology | Admitting: Radiation Oncology

## 2017-05-09 DIAGNOSIS — Z510 Encounter for antineoplastic radiation therapy: Secondary | ICD-10-CM | POA: Diagnosis not present

## 2017-05-09 DIAGNOSIS — N183 Chronic kidney disease, stage 3 (moderate): Secondary | ICD-10-CM | POA: Diagnosis not present

## 2017-05-09 DIAGNOSIS — I129 Hypertensive chronic kidney disease with stage 1 through stage 4 chronic kidney disease, or unspecified chronic kidney disease: Secondary | ICD-10-CM | POA: Diagnosis not present

## 2017-05-09 DIAGNOSIS — C3411 Malignant neoplasm of upper lobe, right bronchus or lung: Secondary | ICD-10-CM | POA: Diagnosis not present

## 2017-05-09 DIAGNOSIS — E7800 Pure hypercholesterolemia, unspecified: Secondary | ICD-10-CM | POA: Diagnosis not present

## 2017-05-11 ENCOUNTER — Ambulatory Visit: Payer: Medicare Other

## 2017-05-15 ENCOUNTER — Encounter: Payer: Self-pay | Admitting: Radiation Oncology

## 2017-05-15 DIAGNOSIS — J439 Emphysema, unspecified: Secondary | ICD-10-CM | POA: Diagnosis not present

## 2017-05-15 DIAGNOSIS — Z87891 Personal history of nicotine dependence: Secondary | ICD-10-CM | POA: Diagnosis not present

## 2017-05-15 DIAGNOSIS — M10079 Idiopathic gout, unspecified ankle and foot: Secondary | ICD-10-CM | POA: Diagnosis not present

## 2017-05-15 DIAGNOSIS — E785 Hyperlipidemia, unspecified: Secondary | ICD-10-CM | POA: Diagnosis not present

## 2017-05-15 DIAGNOSIS — M79605 Pain in left leg: Secondary | ICD-10-CM | POA: Diagnosis not present

## 2017-05-15 DIAGNOSIS — R911 Solitary pulmonary nodule: Secondary | ICD-10-CM | POA: Diagnosis not present

## 2017-05-15 DIAGNOSIS — N183 Chronic kidney disease, stage 3 (moderate): Secondary | ICD-10-CM | POA: Diagnosis not present

## 2017-05-15 DIAGNOSIS — M79604 Pain in right leg: Secondary | ICD-10-CM | POA: Diagnosis not present

## 2017-05-15 DIAGNOSIS — Z9181 History of falling: Secondary | ICD-10-CM | POA: Diagnosis not present

## 2017-05-15 DIAGNOSIS — G309 Alzheimer's disease, unspecified: Secondary | ICD-10-CM | POA: Diagnosis not present

## 2017-05-15 DIAGNOSIS — G8929 Other chronic pain: Secondary | ICD-10-CM | POA: Diagnosis not present

## 2017-05-15 DIAGNOSIS — N3281 Overactive bladder: Secondary | ICD-10-CM | POA: Diagnosis not present

## 2017-05-15 DIAGNOSIS — I129 Hypertensive chronic kidney disease with stage 1 through stage 4 chronic kidney disease, or unspecified chronic kidney disease: Secondary | ICD-10-CM | POA: Diagnosis not present

## 2017-05-15 NOTE — Progress Notes (Signed)
  Radiation Oncology         [phone] ________________________________  Name: Jerry [name] MRN: [id number]  Date: 05/15/2017  DOB: 1936-09-11  End of Treatment Note  Diagnosis: Clinical stage 1A adenocarcinoma of the right upper lobe    Indication for treatment: Curative       Radiation treatment dates: 05/01/17-05/09/17  Site/dose:   Right lung/ 54 Gy in 3 fractions  Beams/energy:   SBRT SRT-VMAT/ 6xFFF  Narrative: The patient tolerated radiation treatment relatively well. During the treatment, the patient complained of an occasional dry cough.  Plan: The patient has completed radiation treatment. The patient will return to radiation oncology clinic for routine followup in one month. I advised them to call or return sooner if they have any questions or concerns related to their recovery or treatment.  -----------------------------------  Blair Promise, PhD, MD  This document serves as a record of services personally performed by Gery Pray, MD. It was created on his behalf by Bethann Humble, a trained medical scribe. The creation of this record is based on the scribe's personal observations and the provider's statements to them. This document has been checked and approved by the attending provider.

## 2017-05-23 ENCOUNTER — Telehealth: Payer: Self-pay | Admitting: Family Medicine

## 2017-05-23 DIAGNOSIS — Z9181 History of falling: Secondary | ICD-10-CM | POA: Diagnosis not present

## 2017-05-23 DIAGNOSIS — M79605 Pain in left leg: Secondary | ICD-10-CM | POA: Diagnosis not present

## 2017-05-23 DIAGNOSIS — J439 Emphysema, unspecified: Secondary | ICD-10-CM | POA: Diagnosis not present

## 2017-05-23 DIAGNOSIS — N183 Chronic kidney disease, stage 3 (moderate): Secondary | ICD-10-CM | POA: Diagnosis not present

## 2017-05-23 DIAGNOSIS — Z87891 Personal history of nicotine dependence: Secondary | ICD-10-CM | POA: Diagnosis not present

## 2017-05-23 DIAGNOSIS — M10079 Idiopathic gout, unspecified ankle and foot: Secondary | ICD-10-CM | POA: Diagnosis not present

## 2017-05-23 DIAGNOSIS — G309 Alzheimer's disease, unspecified: Secondary | ICD-10-CM | POA: Diagnosis not present

## 2017-05-23 DIAGNOSIS — M79604 Pain in right leg: Secondary | ICD-10-CM | POA: Diagnosis not present

## 2017-05-23 DIAGNOSIS — E785 Hyperlipidemia, unspecified: Secondary | ICD-10-CM | POA: Diagnosis not present

## 2017-05-23 DIAGNOSIS — R911 Solitary pulmonary nodule: Secondary | ICD-10-CM | POA: Diagnosis not present

## 2017-05-23 DIAGNOSIS — G8929 Other chronic pain: Secondary | ICD-10-CM | POA: Diagnosis not present

## 2017-05-23 DIAGNOSIS — N3281 Overactive bladder: Secondary | ICD-10-CM | POA: Diagnosis not present

## 2017-05-23 DIAGNOSIS — I129 Hypertensive chronic kidney disease with stage 1 through stage 4 chronic kidney disease, or unspecified chronic kidney disease: Secondary | ICD-10-CM | POA: Diagnosis not present

## 2017-05-23 NOTE — Telephone Encounter (Signed)
Caren Griffins with [facility] would like a verbal to add 5 more visits to pt's Home health also would like an order for ST evaluation for memory and cognitive issues and see if pt would benefit from their services.

## 2017-05-23 NOTE — Telephone Encounter (Signed)
Called and provided verbal order 

## 2017-05-31 DIAGNOSIS — E785 Hyperlipidemia, unspecified: Secondary | ICD-10-CM | POA: Diagnosis not present

## 2017-05-31 DIAGNOSIS — N3281 Overactive bladder: Secondary | ICD-10-CM | POA: Diagnosis not present

## 2017-05-31 DIAGNOSIS — I129 Hypertensive chronic kidney disease with stage 1 through stage 4 chronic kidney disease, or unspecified chronic kidney disease: Secondary | ICD-10-CM | POA: Diagnosis not present

## 2017-05-31 DIAGNOSIS — R911 Solitary pulmonary nodule: Secondary | ICD-10-CM | POA: Diagnosis not present

## 2017-05-31 DIAGNOSIS — Z9181 History of falling: Secondary | ICD-10-CM | POA: Diagnosis not present

## 2017-05-31 DIAGNOSIS — M10079 Idiopathic gout, unspecified ankle and foot: Secondary | ICD-10-CM | POA: Diagnosis not present

## 2017-05-31 DIAGNOSIS — G309 Alzheimer's disease, unspecified: Secondary | ICD-10-CM | POA: Diagnosis not present

## 2017-05-31 DIAGNOSIS — Z87891 Personal history of nicotine dependence: Secondary | ICD-10-CM | POA: Diagnosis not present

## 2017-05-31 DIAGNOSIS — N183 Chronic kidney disease, stage 3 (moderate): Secondary | ICD-10-CM | POA: Diagnosis not present

## 2017-05-31 DIAGNOSIS — J439 Emphysema, unspecified: Secondary | ICD-10-CM | POA: Diagnosis not present

## 2017-06-02 ENCOUNTER — Telehealth: Payer: Self-pay | Admitting: Family Medicine

## 2017-06-02 NOTE — Telephone Encounter (Signed)
-----   Message from Marin Olp, MD sent at 06/02/2017 12:21 PM EDT ----- Ill just sign and lets not charge then.   Garret Reddish  ----- Message ----- From: Hulda Humphrey, CMA Sent: 06/02/2017  11:53 AM To: Marin Olp, MD  I have received home health recert paper work.  There will need to be documentation in the chart that you spent 30 + minutes reviewing the orders, chart etc.  I will be dropping the paper work off later this evening.

## 2017-06-07 ENCOUNTER — Encounter: Payer: Self-pay | Admitting: Oncology

## 2017-06-08 DIAGNOSIS — D3702 Neoplasm of uncertain behavior of tongue: Secondary | ICD-10-CM | POA: Diagnosis not present

## 2017-06-08 DIAGNOSIS — Z87891 Personal history of nicotine dependence: Secondary | ICD-10-CM | POA: Diagnosis not present

## 2017-06-08 DIAGNOSIS — C3491 Malignant neoplasm of unspecified part of right bronchus or lung: Secondary | ICD-10-CM | POA: Diagnosis not present

## 2017-06-09 DIAGNOSIS — G309 Alzheimer's disease, unspecified: Secondary | ICD-10-CM | POA: Diagnosis not present

## 2017-06-09 DIAGNOSIS — E785 Hyperlipidemia, unspecified: Secondary | ICD-10-CM | POA: Diagnosis not present

## 2017-06-09 DIAGNOSIS — R911 Solitary pulmonary nodule: Secondary | ICD-10-CM | POA: Diagnosis not present

## 2017-06-09 DIAGNOSIS — N183 Chronic kidney disease, stage 3 (moderate): Secondary | ICD-10-CM | POA: Diagnosis not present

## 2017-06-09 DIAGNOSIS — J439 Emphysema, unspecified: Secondary | ICD-10-CM | POA: Diagnosis not present

## 2017-06-09 DIAGNOSIS — Z9181 History of falling: Secondary | ICD-10-CM | POA: Diagnosis not present

## 2017-06-09 DIAGNOSIS — Z87891 Personal history of nicotine dependence: Secondary | ICD-10-CM | POA: Diagnosis not present

## 2017-06-09 DIAGNOSIS — I129 Hypertensive chronic kidney disease with stage 1 through stage 4 chronic kidney disease, or unspecified chronic kidney disease: Secondary | ICD-10-CM | POA: Diagnosis not present

## 2017-06-09 DIAGNOSIS — N3281 Overactive bladder: Secondary | ICD-10-CM | POA: Diagnosis not present

## 2017-06-09 DIAGNOSIS — M10079 Idiopathic gout, unspecified ankle and foot: Secondary | ICD-10-CM | POA: Diagnosis not present

## 2017-06-12 ENCOUNTER — Encounter: Payer: Self-pay | Admitting: Radiation Oncology

## 2017-06-12 ENCOUNTER — Ambulatory Visit
Admission: RE | Admit: 2017-06-12 | Discharge: 2017-06-12 | Disposition: A | Discharge status: Home / Self Care | Payer: Medicare Other | Source: Ambulatory Visit | Attending: Radiation Oncology | Admitting: Radiation Oncology

## 2017-06-12 VITALS — BP 127/70 | HR 65 | Temp 98.3°F | Resp 18 | Ht 68.0 in | Wt 194.0 lb

## 2017-06-12 DIAGNOSIS — Z510 Encounter for antineoplastic radiation therapy: Secondary | ICD-10-CM | POA: Diagnosis not present

## 2017-06-12 DIAGNOSIS — N183 Chronic kidney disease, stage 3 (moderate): Secondary | ICD-10-CM | POA: Diagnosis not present

## 2017-06-12 DIAGNOSIS — R911 Solitary pulmonary nodule: Secondary | ICD-10-CM

## 2017-06-12 DIAGNOSIS — I129 Hypertensive chronic kidney disease with stage 1 through stage 4 chronic kidney disease, or unspecified chronic kidney disease: Secondary | ICD-10-CM | POA: Diagnosis not present

## 2017-06-12 DIAGNOSIS — C3411 Malignant neoplasm of upper lobe, right bronchus or lung: Secondary | ICD-10-CM | POA: Diagnosis not present

## 2017-06-12 DIAGNOSIS — E7800 Pure hypercholesterolemia, unspecified: Secondary | ICD-10-CM | POA: Diagnosis not present

## 2017-06-12 NOTE — Progress Notes (Signed)
  Radiation Oncology         [phone] ________________________________  Name: Jerry [name] MRN: [id number]  Date: 06/12/2017  DOB: 03/23/1936  Follow-Up Visit Note  CC: Marin Olp, MD  Marin Olp, MD    ICD-10-CM   1. Lung nodule R91.1     Diagnosis:   Clinical Stage IA adenocarcinoma of the right upper lobe  Interval Since Last Radiation:  1 month 05/01/17 - 05/09/17 : Right Lung treated to 54 Gy in 3 fractions with SBRT  Narrative:  The patient returns today for routine follow-up of radiation completed on 05/09/17. He is accompanied by family today.  On review of systems, the patient denies pain. He denies shortness of breath, coughing, or wheezing, other than that related to seasonal allergies. He denies hemoptysis. He denies problems related to swallowing. He reports he is eating 2-3 meals per day.                   Of note, the patient's daughter reports the patient has "a cyst in his throat," which Dr. Janace Hoard has recently viewed through laryngoscopy. He plans to biopsy the area in the future.  ALLERGIES:  is allergic to namenda [memantine].  Meds: Current Outpatient Prescriptions  Medication Sig Dispense Refill  . amLODipine-olmesartan (AZOR) 5-40 MG tablet TAKE 1 TABLET BY MOUTH DAILY 30 tablet 5  . atorvastatin (LIPITOR) 40 MG tablet TAKE 1 TABLET(40 MG) BY MOUTH DAILY 30 tablet 5  . donepezil (ARICEPT) 5 MG tablet Take once a day (Patient taking differently: Take 5 mg by mouth every morning. Take once a day) 30 tablet 5   No current facility-administered medications for this encounter.    REVIEW OF SYSTEMS: A 10+ POINT REVIEW OF SYSTEMS WAS OBTAINED including neurology, dermatology, psychiatry, cardiac, respiratory, lymph, extremities, GI, GU, musculoskeletal, constitutional, reproductive, HEENT. All pertinent positives are noted in the HPI. All others are negative.  Physical Findings: The patient is in no acute distress. Patient is alert and  oriented.  height is 5\' 8"  (1.727 m) and weight is 194 lb (88 kg). His oral temperature is 98.3 F (36.8 C). His blood pressure is 127/70 and his pulse is 65. His respiration is 18 and oxygen saturation is 98%.  No significant changes. Lungs are clear to auscultation bilaterally. Heart has regular rate and rhythm. No palpable cervical, supraclavicular, or axillary adenopathy. Abdomen soft, non-tender, with normal bowel sounds.  Lab Findings: Lab Results  Component Value Date   WBC 7.1 03/22/2017   HGB 12.7 (L) 03/22/2017   HCT 38.3 (L) 03/22/2017   MCV 88.9 03/22/2017   PLT 209 03/22/2017    Radiographic Findings: No results found.  Impression: Doing well and no apparent side effects from his radiation treatment.  Plan:  Follow up in 3 months with repeat chest CT scan afterwards.   -----------------------------------  Blair Promise, PhD, MD  This document serves as a record of services personally performed by Gery Pray, MD. It was created on his behalf by Maryla Morrow, a trained medical scribe. The creation of this record is based on the scribe's personal observations and the provider's statements to them. This document has been checked and approved by the attending provider.

## 2017-06-12 NOTE — Progress Notes (Addendum)
Jerry [name] 81 y.o. man with Clinical stage 1A adenocarcinoma of the right upper lobe radiation completed 05-09-17, one month FU>    Weight changes, if any: Wt Readings from Last 3 Encounters:  06/12/17 194 lb (88 kg)  03/23/17 204 lb 3.2 oz (92.6 kg)  03/22/17 200 lb (90.7 kg)   Respiratory complaints, if any: Denies SOB,coughing or wheezing Hemoptysis, if any:  No Swallowing Problems/Pain/Difficulty swallowing:None Appetite : Good eating two to three meals a day Pain:No Skin with normal color. When is next chemo scheduled?:N/A Imaging:N/A Lab work from of chart:N/A BP 127/70   Pulse 65   Temp 98.3 F (36.8 C) (Oral)   Resp 18   Ht 5\' 8"  (1.727 m)   Wt 194 lb (88 kg)   SpO2 98%   BMI 29.50 kg/m

## 2017-06-15 DIAGNOSIS — Z87891 Personal history of nicotine dependence: Secondary | ICD-10-CM | POA: Diagnosis not present

## 2017-06-15 DIAGNOSIS — E785 Hyperlipidemia, unspecified: Secondary | ICD-10-CM | POA: Diagnosis not present

## 2017-06-15 DIAGNOSIS — N183 Chronic kidney disease, stage 3 (moderate): Secondary | ICD-10-CM | POA: Diagnosis not present

## 2017-06-15 DIAGNOSIS — J439 Emphysema, unspecified: Secondary | ICD-10-CM | POA: Diagnosis not present

## 2017-06-15 DIAGNOSIS — I129 Hypertensive chronic kidney disease with stage 1 through stage 4 chronic kidney disease, or unspecified chronic kidney disease: Secondary | ICD-10-CM | POA: Diagnosis not present

## 2017-06-15 DIAGNOSIS — M10079 Idiopathic gout, unspecified ankle and foot: Secondary | ICD-10-CM | POA: Diagnosis not present

## 2017-06-15 DIAGNOSIS — N3281 Overactive bladder: Secondary | ICD-10-CM | POA: Diagnosis not present

## 2017-06-15 DIAGNOSIS — Z9181 History of falling: Secondary | ICD-10-CM | POA: Diagnosis not present

## 2017-06-15 DIAGNOSIS — R911 Solitary pulmonary nodule: Secondary | ICD-10-CM | POA: Diagnosis not present

## 2017-06-15 DIAGNOSIS — G309 Alzheimer's disease, unspecified: Secondary | ICD-10-CM | POA: Diagnosis not present

## 2017-06-20 ENCOUNTER — Other Ambulatory Visit: Payer: Self-pay | Admitting: Otolaryngology

## 2017-06-20 ENCOUNTER — Encounter (HOSPITAL_COMMUNITY): Payer: Self-pay | Admitting: *Deleted

## 2017-06-20 NOTE — Anesthesia Preprocedure Evaluation (Addendum)
Anesthesia Evaluation  Patient identified by MRN, date of birth, ID band Patient awake    Reviewed: Allergy & Precautions, NPO status , Patient's Chart, lab work & pertinent test results  Airway Mallampati: I  TM Distance: >3 FB Neck ROM: Full    Dental no notable dental hx. (+) Poor Dentition, Missing   Pulmonary neg pulmonary ROS, former smoker,    Pulmonary exam normal breath sounds clear to auscultation       Cardiovascular hypertension, negative cardio ROS Normal cardiovascular exam Rhythm:Regular Rate:Normal     Neuro/Psych negative neurological ROS  negative psych ROS   GI/Hepatic negative GI ROS, Neg liver ROS,   Endo/Other  negative endocrine ROS  Renal/GU negative Renal ROS  negative genitourinary   Musculoskeletal negative musculoskeletal ROS (+)   Abdominal   Peds negative pediatric ROS (+)  Hematology negative hematology ROS (+)   Anesthesia Other Findings   Reproductive/Obstetrics negative OB ROS                            Anesthesia Physical Anesthesia Plan  ASA: III  Anesthesia Plan: General   Post-op Pain Management:    Induction: Intravenous  PONV Risk Score and Plan: 1 and Ondansetron and Dexamethasone  Airway Management Planned: Oral ETT  Additional Equipment:   Intra-op Plan:   Post-operative Plan: Extubation in OR  Informed Consent:   Dental advisory given  Plan Discussed with:   Anesthesia Plan Comments: (  )        Anesthesia Quick Evaluation

## 2017-06-20 NOTE — Progress Notes (Signed)
Spoke with pt's daugther, [name] for pre-op call. Pt has dementia. She denies any cardiac history, chest pain or sob for pt. She states pt is not diabetic.

## 2017-06-21 ENCOUNTER — Ambulatory Visit (HOSPITAL_COMMUNITY): Payer: Medicare Other | Admitting: Critical Care Medicine

## 2017-06-21 ENCOUNTER — Ambulatory Visit (HOSPITAL_COMMUNITY)
Admission: RE | Admit: 2017-06-21 | Discharge: 2017-06-21 | Disposition: A | Discharge status: Home / Self Care | Payer: Medicare Other | Source: Ambulatory Visit | Attending: Otolaryngology | Admitting: Otolaryngology

## 2017-06-21 ENCOUNTER — Encounter (HOSPITAL_COMMUNITY)
Admission: RE | Disposition: A | Discharge status: Home / Self Care | Payer: Self-pay | Source: Ambulatory Visit | Attending: Otolaryngology

## 2017-06-21 ENCOUNTER — Encounter (HOSPITAL_COMMUNITY): Payer: Self-pay

## 2017-06-21 DIAGNOSIS — E7800 Pure hypercholesterolemia, unspecified: Secondary | ICD-10-CM | POA: Insufficient documentation

## 2017-06-21 DIAGNOSIS — K148 Other diseases of tongue: Secondary | ICD-10-CM | POA: Diagnosis not present

## 2017-06-21 DIAGNOSIS — Z803 Family history of malignant neoplasm of breast: Secondary | ICD-10-CM | POA: Diagnosis not present

## 2017-06-21 DIAGNOSIS — I129 Hypertensive chronic kidney disease with stage 1 through stage 4 chronic kidney disease, or unspecified chronic kidney disease: Secondary | ICD-10-CM | POA: Insufficient documentation

## 2017-06-21 DIAGNOSIS — Z811 Family history of alcohol abuse and dependence: Secondary | ICD-10-CM | POA: Diagnosis not present

## 2017-06-21 DIAGNOSIS — Z888 Allergy status to other drugs, medicaments and biological substances status: Secondary | ICD-10-CM | POA: Insufficient documentation

## 2017-06-21 DIAGNOSIS — C3490 Malignant neoplasm of unspecified part of unspecified bronchus or lung: Secondary | ICD-10-CM | POA: Insufficient documentation

## 2017-06-21 DIAGNOSIS — N183 Chronic kidney disease, stage 3 (moderate): Secondary | ICD-10-CM | POA: Diagnosis not present

## 2017-06-21 DIAGNOSIS — Z87891 Personal history of nicotine dependence: Secondary | ICD-10-CM | POA: Insufficient documentation

## 2017-06-21 DIAGNOSIS — A4289 Other forms of actinomycosis: Secondary | ICD-10-CM | POA: Insufficient documentation

## 2017-06-21 DIAGNOSIS — C61 Malignant neoplasm of prostate: Secondary | ICD-10-CM | POA: Insufficient documentation

## 2017-06-21 DIAGNOSIS — F0390 Unspecified dementia without behavioral disturbance: Secondary | ICD-10-CM | POA: Diagnosis not present

## 2017-06-21 DIAGNOSIS — D380 Neoplasm of uncertain behavior of larynx: Secondary | ICD-10-CM | POA: Diagnosis not present

## 2017-06-21 DIAGNOSIS — Z79899 Other long term (current) drug therapy: Secondary | ICD-10-CM | POA: Diagnosis not present

## 2017-06-21 DIAGNOSIS — Z923 Personal history of irradiation: Secondary | ICD-10-CM | POA: Insufficient documentation

## 2017-06-21 DIAGNOSIS — E785 Hyperlipidemia, unspecified: Secondary | ICD-10-CM | POA: Diagnosis not present

## 2017-06-21 HISTORY — DX: Malignant neoplasm of unspecified part of unspecified bronchus or lung: C34.90

## 2017-06-21 HISTORY — PX: DIRECT LARYNGOSCOPY: SHX5326

## 2017-06-21 LAB — CBC
HCT: 38.5 % — ABNORMAL LOW (ref 39.0–52.0)
HEMOGLOBIN: 12.5 g/dL — AB (ref 13.0–17.0)
MCH: 29.1 pg (ref 26.0–34.0)
MCHC: 32.5 g/dL (ref 30.0–36.0)
MCV: 89.7 fL (ref 78.0–100.0)
Platelets: 181 10*3/uL (ref 150–400)
RBC: 4.29 MIL/uL (ref 4.22–5.81)
RDW: 13.9 % (ref 11.5–15.5)
WBC: 6 10*3/uL (ref 4.0–10.5)

## 2017-06-21 LAB — BASIC METABOLIC PANEL
ANION GAP: 7 (ref 5–15)
BUN: 10 mg/dL (ref 6–20)
CHLORIDE: 112 mmol/L — AB (ref 101–111)
CO2: 23 mmol/L (ref 22–32)
CREATININE: 1.22 mg/dL (ref 0.61–1.24)
Calcium: 8.8 mg/dL — ABNORMAL LOW (ref 8.9–10.3)
GFR calc non Af Amer: 55 mL/min — ABNORMAL LOW (ref >60)
GLUCOSE: 83 mg/dL (ref 65–99)
Potassium: 3.5 mmol/L (ref 3.5–5.1)
Sodium: 142 mmol/L (ref 135–145)

## 2017-06-21 SURGERY — LARYNGOSCOPY, DIRECT
Anesthesia: General | Site: Mouth

## 2017-06-21 MED ORDER — FENTANYL CITRATE (PF) 100 MCG/2ML IJ SOLN: 25.0000 ug | INTRAMUSCULAR | Status: DC | PRN

## 2017-06-21 MED ORDER — SUGAMMADEX SODIUM 200 MG/2ML IV SOLN
INTRAVENOUS | Status: AC
  Filled 2017-06-21: qty 4

## 2017-06-21 MED ORDER — LIDOCAINE 2% (20 MG/ML) 5 ML SYRINGE
INTRAMUSCULAR | Status: DC | PRN
  Administered 2017-06-21: 100 mg via INTRAVENOUS

## 2017-06-21 MED ORDER — MEPERIDINE HCL 25 MG/ML IJ SOLN: 6.2500 mg | INTRAMUSCULAR | Status: DC | PRN

## 2017-06-21 MED ORDER — PROPOFOL 10 MG/ML IV BOLUS
INTRAVENOUS | Status: AC
  Filled 2017-06-21: qty 20

## 2017-06-21 MED ORDER — DEXAMETHASONE SODIUM PHOSPHATE 10 MG/ML IJ SOLN
INTRAMUSCULAR | Status: AC
  Filled 2017-06-21: qty 1

## 2017-06-21 MED ORDER — EPINEPHRINE HCL (NASAL) 0.1 % NA SOLN
NASAL | Status: DC | PRN
  Administered 2017-06-21: 30 mL via NASAL

## 2017-06-21 MED ORDER — SUCCINYLCHOLINE CHLORIDE 200 MG/10ML IV SOSY
PREFILLED_SYRINGE | INTRAVENOUS | Status: AC
  Filled 2017-06-21: qty 10

## 2017-06-21 MED ORDER — ROCURONIUM BROMIDE 10 MG/ML (PF) SYRINGE
PREFILLED_SYRINGE | INTRAVENOUS | Status: AC
  Filled 2017-06-21: qty 5

## 2017-06-21 MED ORDER — ONDANSETRON HCL 4 MG/2ML IJ SOLN
INTRAMUSCULAR | Status: AC
  Filled 2017-06-21: qty 2

## 2017-06-21 MED ORDER — OXYMETAZOLINE HCL 0.05 % NA SOLN
NASAL | Status: AC
  Filled 2017-06-21: qty 15

## 2017-06-21 MED ORDER — LACTATED RINGERS IV SOLN
INTRAVENOUS | Status: DC | PRN
  Administered 2017-06-21: 08:00:00 via INTRAVENOUS

## 2017-06-21 MED ORDER — LIDOCAINE 2% (20 MG/ML) 5 ML SYRINGE
INTRAMUSCULAR | Status: AC
  Filled 2017-06-21: qty 5

## 2017-06-21 MED ORDER — ONDANSETRON HCL 4 MG/2ML IJ SOLN
INTRAMUSCULAR | Status: DC | PRN
  Administered 2017-06-21: 4 mg via INTRAVENOUS

## 2017-06-21 MED ORDER — EPHEDRINE SULFATE-NACL 50-0.9 MG/10ML-% IV SOSY
PREFILLED_SYRINGE | INTRAVENOUS | Status: DC | PRN
  Administered 2017-06-21: 10 mg via INTRAVENOUS

## 2017-06-21 MED ORDER — ONDANSETRON HCL 4 MG/2ML IJ SOLN: 4.0000 mg | Freq: Once | INTRAMUSCULAR | Status: DC | PRN

## 2017-06-21 MED ORDER — SUGAMMADEX SODIUM 200 MG/2ML IV SOLN
INTRAVENOUS | Status: DC | PRN
  Administered 2017-06-21: 400 mg via INTRAVENOUS

## 2017-06-21 MED ORDER — EPINEPHRINE HCL (NASAL) 0.1 % NA SOLN
NASAL | Status: AC
  Filled 2017-06-21: qty 30

## 2017-06-21 MED ORDER — ROCURONIUM BROMIDE 10 MG/ML (PF) SYRINGE
PREFILLED_SYRINGE | INTRAVENOUS | Status: DC | PRN
  Administered 2017-06-21: 30 mg via INTRAVENOUS

## 2017-06-21 MED ORDER — FENTANYL CITRATE (PF) 250 MCG/5ML IJ SOLN
INTRAMUSCULAR | Status: DC | PRN
  Administered 2017-06-21: 100 ug via INTRAVENOUS
  Administered 2017-06-21: 50 ug via INTRAVENOUS

## 2017-06-21 MED ORDER — 0.9 % SODIUM CHLORIDE (POUR BTL) OPTIME
TOPICAL | Status: DC | PRN
  Administered 2017-06-21: 1000 mL

## 2017-06-21 MED ORDER — DEXAMETHASONE SODIUM PHOSPHATE 10 MG/ML IJ SOLN
INTRAMUSCULAR | Status: DC | PRN
  Administered 2017-06-21: 10 mg via INTRAVENOUS

## 2017-06-21 MED ORDER — FENTANYL CITRATE (PF) 250 MCG/5ML IJ SOLN
INTRAMUSCULAR | Status: AC
  Filled 2017-06-21: qty 5

## 2017-06-21 MED ORDER — PROPOFOL 10 MG/ML IV BOLUS
INTRAVENOUS | Status: DC | PRN
  Administered 2017-06-21: 150 mg via INTRAVENOUS

## 2017-06-21 MED ORDER — SUCCINYLCHOLINE CHLORIDE 200 MG/10ML IV SOSY
PREFILLED_SYRINGE | INTRAVENOUS | Status: DC | PRN
  Administered 2017-06-21: 120 mg via INTRAVENOUS

## 2017-06-21 SURGICAL SUPPLY — 21 items
CANISTER SUCT 3000ML PPV (MISCELLANEOUS) ×3 IMPLANT
COVER BACK TABLE 60X90IN (DRAPES) ×3 IMPLANT
COVER MAYO STAND STRL (DRAPES) ×3 IMPLANT
CRADLE DONUT ADULT HEAD (MISCELLANEOUS) ×2 IMPLANT
DRAPE HALF SHEET 40X57 (DRAPES) ×3 IMPLANT
GAUZE SPONGE 4X4 16PLY XRAY LF (GAUZE/BANDAGES/DRESSINGS) IMPLANT
GLOVE ECLIPSE 7.5 STRL STRAW (GLOVE) ×3 IMPLANT
GUARD TEETH (MISCELLANEOUS) ×2 IMPLANT
KIT BASIN OR (CUSTOM PROCEDURE TRAY) ×3 IMPLANT
KIT ROOM TURNOVER OR (KITS) ×3 IMPLANT
NDL HYPO 25GX1X1/2 BEV (NEEDLE) IMPLANT
NEEDLE HYPO 25GX1X1/2 BEV (NEEDLE) IMPLANT
NS IRRIG 1000ML POUR BTL (IV SOLUTION) ×3 IMPLANT
PAD ARMBOARD 7.5X6 YLW CONV (MISCELLANEOUS) ×6 IMPLANT
PATTIES SURGICAL .5 X3 (DISPOSABLE) ×2 IMPLANT
SOLUTION ANTI FOG 6CC (MISCELLANEOUS) IMPLANT
SPECIMEN JAR SMALL (MISCELLANEOUS) IMPLANT
SURGILUBE 2OZ TUBE FLIPTOP (MISCELLANEOUS) IMPLANT
TOWEL OR 17X24 6PK STRL BLUE (TOWEL DISPOSABLE) ×6 IMPLANT
TUBE CONNECTING 12'X1/4 (SUCTIONS) ×1
TUBE CONNECTING 12X1/4 (SUCTIONS) ×2 IMPLANT

## 2017-06-21 NOTE — Op Note (Signed)
Preop/postop diagnosis: Left base of tongue mass Procedure: Direct laryngoscopy with biopsy Anesthesia: Gen. Estimated blood loss: Less than 5 mL Indications: 80 year old with many medical issues and has currently being treated cancer. He is undergoing treatment for his prostate and apparently a lung lesion. He has  had a area on the left base of tongue that now has increased metabolic activity on PET scan. We discussed this situation and he wants to proceed with a biopsy. We discussed risks, benefits, and options. All his questions are answered and consent was obtained.  Procedure: Patient was taken to the operating room placed in the supine position after general endotracheal tube anesthesia the rose position. The Dedo scope was inserted in the pharynx and larynx rate examined. There was no evidence of any lesions or masses. The post cricoid was clear. The left base of tongue does have a area of cystic appearing tissue but also a slight exophytic area just above that. It does have the appearance of adenoid like tissue but biopsies were taken with a cup forcep. This was taken of that irregular tissue as well as the cyst. The adrenaline soaked pledgets were then placed into the wound and gain good hemostasis. No other lesions identified. Palpation of the area did not reveal any firm tissue or mass. The patient was then awakened brought to recovery in stable condition counts correct

## 2017-06-21 NOTE — H&P (Signed)
Jerry [name] is an 81 y.o. male.   Chief Complaint:tongue mass HPI: hx of cyst in tongue and PET scan with increased activity. He wants biopsy.   Past Medical History:  Diagnosis Date  . Cancer of prostate ([place]) 2001  . Chronic kidney disease (CKD), stage III (moderate)    [name] 02/08/2017  . Dementia   . DIVERTICULOSIS, COLON 07/25/2007   Qualifier: Diagnosis of  By: [name], LPN, [name]   . High cholesterol   . History of radiation therapy 05/01/17-05/09/17   right lung 54 Gy in 3 fractions  . Hypertension   . Lung cancer [facility])     Past Surgical History:  Procedure Laterality Date  . EYE SURGERY    . INSERTION PROSTATE RADIATION SEED  07/2000   [name] 05/09/2011  . PENILE PROSTHESIS IMPLANT  02/2002   Insertion of Mentor three piece inflatable penile prosthesis./notes 05/09/2011  . PENILE PROSTHESIS PLACEMENT  06/2003   Placement of Mentor rod prosthesis/notes 05/09/2011  . REFRACTIVE SURGERY Bilateral   . REMOVAL OF PENILE PROSTHESIS  06/2003   Removal of three-piece Mentor prosthesis/notes 05/09/2011  . SCROTAL SURGERY  06/2003   Repair of scrotal defect/notes 05/09/2011    Family History  Problem Relation Age of Onset  . Early death Father        cirrhosis  . Alcoholism Father   . Cirrhosis Unknown   . Breast cancer Sister    Social History:  reports that he quit smoking about 28 years ago. He smoked 0.50 packs per day. He has never used smokeless tobacco. He reports that he does not drink alcohol or use drugs.  Allergies:  Allergies  Allergen Reactions  . Namenda [Memantine] Other (See Comments)    Made pt feel dizzy    Medications Prior to Admission  Medication Sig Dispense Refill  . amLODipine-olmesartan (AZOR) 5-40 MG tablet TAKE 1 TABLET BY MOUTH DAILY 30 tablet 5  . atorvastatin (LIPITOR) 40 MG tablet TAKE 1 TABLET(40 MG) BY MOUTH DAILY 30 tablet 5  . donepezil (ARICEPT) 5 MG tablet Take once a day (Patient taking differently: Take 5 mg by mouth daily.  Take once a day) 30 tablet 5  . Homeopathic Products ([employer] PINK EYE OP) Place 1 drop into both eyes 2 (two) times daily as needed (REDNESS).    . magnesium citrate SOLN Take 1 Bottle by mouth daily as needed for severe constipation.      Results for orders placed or performed during the hospital encounter of 06/21/17 (from the past 48 hour(s))  Basic metabolic panel     Status: Abnormal   Collection Time: 06/21/17  6:31 AM  Result Value Ref Range   Sodium 142 135 - 145 mmol/L   Potassium 3.5 3.5 - 5.1 mmol/L   Chloride 112 (H) 101 - 111 mmol/L   CO2 23 22 - 32 mmol/L   Glucose, Bld 83 65 - 99 mg/dL   BUN 10 6 - 20 mg/dL   Creatinine, Ser 1.22 0.61 - 1.24 mg/dL   Calcium 8.8 (L) 8.9 - 10.3 mg/dL   GFR calc non Af Amer 55 (L) >60 mL/min   GFR calc Af Amer >60 >60 mL/min    Comment: (NOTE) The eGFR has been calculated using the CKD EPI equation. This calculation has not been validated in all clinical situations. eGFR's persistently <60 mL/min signify possible Chronic Kidney Disease.    Anion gap 7 5 - 15  CBC     Status: Abnormal  Collection Time: 06/21/17  6:31 AM  Result Value Ref Range   WBC 6.0 4.0 - 10.5 K/uL   RBC 4.29 4.22 - 5.81 MIL/uL   Hemoglobin 12.5 (L) 13.0 - 17.0 g/dL   HCT 38.5 (L) 39.0 - 52.0 %   MCV 89.7 78.0 - 100.0 fL   MCH 29.1 26.0 - 34.0 pg   MCHC 32.5 30.0 - 36.0 g/dL   RDW 13.9 11.5 - 15.5 %   Platelets 181 150 - 400 K/uL   No results found.  Review of Systems  Constitutional: Negative.   HENT: Negative.   Eyes: Negative.   Respiratory: Negative.   Cardiovascular: Negative.   Skin: Negative.     Blood pressure (!) 162/64, pulse (!) 57, temperature 98 F (36.7 C), temperature source Oral, resp. rate 18, height _0  (1.727 m), weight 88 kg (194 lb), SpO2 100 %. Physical Exam  Constitutional: He appears well-developed and well-nourished.  HENT:  Head: Normocephalic and atraumatic.  Nose: Nose normal.  Mouth/Throat: Oropharynx is clear and  moist.  Eyes: Pupils are equal, round, and reactive to light.  Neck: Neck supple.  Cardiovascular: Normal rate.   Respiratory: Effort normal.  GI: Soft.  Musculoskeletal: Normal range of motion.     Assessment/Plan Rongue mass- discussed DL and biopsy and ready to proceed.  Melissa Montane, MD 06/21/2017, 7:20 AM

## 2017-06-21 NOTE — Anesthesia Procedure Notes (Signed)
Procedure Name: Intubation Date/Time: 06/21/2017 7:46 AM Performed by: Teressa Lower Pre-anesthesia Checklist: Patient identified, Emergency Drugs available, Suction available and Patient being monitored Patient Re-evaluated:Patient Re-evaluated prior to inductionOxygen Delivery Method: Circle system utilized Preoxygenation: Pre-oxygenation with 100% oxygen Intubation Type: IV induction Ventilation: Mask ventilation without difficulty Laryngoscope Size: Mac and 4 Grade View: Grade I Tube type: Oral Tube size: 6.5 mm Number of attempts: 1 Airway Equipment and Method: Stylet and Oral airway Placement Confirmation: ETT inserted through vocal cords under direct vision,  positive ETCO2 and breath sounds checked- equal and bilateral Secured at: 22 cm Tube secured with: Tape Dental Injury: Teeth and Oropharynx as per pre-operative assessment

## 2017-06-21 NOTE — Anesthesia Postprocedure Evaluation (Signed)
Anesthesia Post Note  Patient: Jerry [name]  Procedure(s) Performed: Procedure(s) (LRB): DIRECT LARYNGOSCOPY (N/A)     Patient location during evaluation: PACU Anesthesia Type: General Level of consciousness: awake and alert Pain management: pain level controlled Vital Signs Assessment: post-procedure vital signs reviewed and stable Respiratory status: spontaneous breathing, nonlabored ventilation, respiratory function stable and patient connected to nasal cannula oxygen Cardiovascular status: blood pressure returned to baseline and stable Postop Assessment: no signs of nausea or vomiting Anesthetic complications: no    Last Vitals:  Vitals:   06/21/17 0900 06/21/17 0912  BP: (!) 154/71 (!) 148/73  Pulse: 75 71  Resp: 15   Temp: 36.1 C     Last Pain:  Vitals:   06/21/17 0622  TempSrc: Oral  PainSc: 0-No pain                 Lyvonne Cassell

## 2017-06-21 NOTE — Transfer of Care (Signed)
Immediate Anesthesia Transfer of Care Note  Patient: Jerry [name]  Procedure(s) Performed: Procedure(s): DIRECT LARYNGOSCOPY (N/A)  Patient Location: PACU  Anesthesia Type:General  Level of Consciousness: awake, alert  and oriented  Airway & Oxygen Therapy: Patient Spontanous Breathing  Post-op Assessment: Report given to RN and Post -op Vital signs reviewed and stable  Post vital signs: Reviewed and stable  Last Vitals:  Vitals:   06/21/17 0622 06/21/17 0810  BP: (!) 162/64   Pulse: (!) 57   Resp: 18   Temp: 36.7 C (P) 36.2 C    Last Pain:  Vitals:   06/21/17 0622  TempSrc: Oral  PainSc: 0-No pain      Patients Stated Pain Goal: 5 (06/18/75 2831)  Complications: No apparent anesthesia complications

## 2017-06-22 ENCOUNTER — Encounter (HOSPITAL_COMMUNITY): Payer: Self-pay | Admitting: Otolaryngology

## 2017-06-27 ENCOUNTER — Telehealth: Payer: Self-pay | Admitting: Family Medicine

## 2017-06-27 DIAGNOSIS — E785 Hyperlipidemia, unspecified: Secondary | ICD-10-CM | POA: Diagnosis not present

## 2017-06-27 DIAGNOSIS — M10079 Idiopathic gout, unspecified ankle and foot: Secondary | ICD-10-CM | POA: Diagnosis not present

## 2017-06-27 DIAGNOSIS — R911 Solitary pulmonary nodule: Secondary | ICD-10-CM | POA: Diagnosis not present

## 2017-06-27 DIAGNOSIS — Z87891 Personal history of nicotine dependence: Secondary | ICD-10-CM | POA: Diagnosis not present

## 2017-06-27 DIAGNOSIS — G309 Alzheimer's disease, unspecified: Secondary | ICD-10-CM | POA: Diagnosis not present

## 2017-06-27 DIAGNOSIS — Z9181 History of falling: Secondary | ICD-10-CM | POA: Diagnosis not present

## 2017-06-27 DIAGNOSIS — J439 Emphysema, unspecified: Secondary | ICD-10-CM | POA: Diagnosis not present

## 2017-06-27 DIAGNOSIS — N183 Chronic kidney disease, stage 3 (moderate): Secondary | ICD-10-CM | POA: Diagnosis not present

## 2017-06-27 DIAGNOSIS — I129 Hypertensive chronic kidney disease with stage 1 through stage 4 chronic kidney disease, or unspecified chronic kidney disease: Secondary | ICD-10-CM | POA: Diagnosis not present

## 2017-06-27 DIAGNOSIS — N3281 Overactive bladder: Secondary | ICD-10-CM | POA: Diagnosis not present

## 2017-06-27 NOTE — Telephone Encounter (Signed)
° ° °  Cynthis with [facility] call to ask for verbal orders for 1 week 1 for nursing then they will discharge him after that visit.   Would like a call back   [phone]

## 2017-06-27 NOTE — Telephone Encounter (Signed)
Verbal order provided as requested

## 2017-07-04 DIAGNOSIS — N3281 Overactive bladder: Secondary | ICD-10-CM | POA: Diagnosis not present

## 2017-07-04 DIAGNOSIS — I129 Hypertensive chronic kidney disease with stage 1 through stage 4 chronic kidney disease, or unspecified chronic kidney disease: Secondary | ICD-10-CM | POA: Diagnosis not present

## 2017-07-04 DIAGNOSIS — Z87891 Personal history of nicotine dependence: Secondary | ICD-10-CM | POA: Diagnosis not present

## 2017-07-04 DIAGNOSIS — R911 Solitary pulmonary nodule: Secondary | ICD-10-CM | POA: Diagnosis not present

## 2017-07-04 DIAGNOSIS — M10079 Idiopathic gout, unspecified ankle and foot: Secondary | ICD-10-CM | POA: Diagnosis not present

## 2017-07-04 DIAGNOSIS — N183 Chronic kidney disease, stage 3 (moderate): Secondary | ICD-10-CM | POA: Diagnosis not present

## 2017-07-04 DIAGNOSIS — J439 Emphysema, unspecified: Secondary | ICD-10-CM | POA: Diagnosis not present

## 2017-07-04 DIAGNOSIS — E785 Hyperlipidemia, unspecified: Secondary | ICD-10-CM | POA: Diagnosis not present

## 2017-07-04 DIAGNOSIS — Z9181 History of falling: Secondary | ICD-10-CM | POA: Diagnosis not present

## 2017-07-04 DIAGNOSIS — G309 Alzheimer's disease, unspecified: Secondary | ICD-10-CM | POA: Diagnosis not present

## 2017-07-05 DIAGNOSIS — H401131 Primary open-angle glaucoma, bilateral, mild stage: Secondary | ICD-10-CM | POA: Diagnosis not present

## 2017-09-15 ENCOUNTER — Other Ambulatory Visit: Payer: Self-pay

## 2017-09-15 ENCOUNTER — Telehealth: Payer: Self-pay | Admitting: Neurology

## 2017-09-15 DIAGNOSIS — R413 Other amnesia: Secondary | ICD-10-CM

## 2017-09-15 MED ORDER — DONEPEZIL HCL 5 MG PO TABS: ORAL_TABLET | ORAL | 5 refills | Status: DC

## 2017-09-15 NOTE — Telephone Encounter (Signed)
Patient daughter needs to talk to someone about medication refill she states that we should have gotten something from [facility] [address] but they have not heard from [place]

## 2017-09-15 NOTE — Telephone Encounter (Signed)
LMOVM for [name] asking that she return my call.  I have not received anything from the pharmacy.  Looking in chart it does not appear that DR. Delice Lesch has prescribed any medications to this pt.

## 2017-09-18 ENCOUNTER — Ambulatory Visit
Admission: RE | Admit: 2017-09-18 | Discharge: 2017-09-18 | Disposition: A | Discharge status: Home / Self Care | Payer: Medicare Other | Source: Ambulatory Visit | Attending: Radiation Oncology | Admitting: Radiation Oncology

## 2017-09-18 ENCOUNTER — Encounter: Payer: Self-pay | Admitting: Radiation Oncology

## 2017-09-18 VITALS — BP 118/105 | HR 54 | Temp 98.4°F | Ht 68.0 in | Wt 185.8 lb

## 2017-09-18 DIAGNOSIS — Z888 Allergy status to other drugs, medicaments and biological substances status: Secondary | ICD-10-CM | POA: Diagnosis not present

## 2017-09-18 DIAGNOSIS — Z923 Personal history of irradiation: Secondary | ICD-10-CM | POA: Diagnosis not present

## 2017-09-18 DIAGNOSIS — J439 Emphysema, unspecified: Secondary | ICD-10-CM | POA: Diagnosis not present

## 2017-09-18 DIAGNOSIS — Z79899 Other long term (current) drug therapy: Secondary | ICD-10-CM | POA: Insufficient documentation

## 2017-09-18 DIAGNOSIS — C3411 Malignant neoplasm of upper lobe, right bronchus or lung: Secondary | ICD-10-CM | POA: Insufficient documentation

## 2017-09-18 DIAGNOSIS — Z85118 Personal history of other malignant neoplasm of bronchus and lung: Secondary | ICD-10-CM | POA: Insufficient documentation

## 2017-09-18 DIAGNOSIS — R911 Solitary pulmonary nodule: Secondary | ICD-10-CM | POA: Diagnosis not present

## 2017-09-18 DIAGNOSIS — Z9889 Other specified postprocedural states: Secondary | ICD-10-CM | POA: Insufficient documentation

## 2017-09-18 DIAGNOSIS — Z08 Encounter for follow-up examination after completed treatment for malignant neoplasm: Secondary | ICD-10-CM | POA: Diagnosis not present

## 2017-09-18 NOTE — Progress Notes (Addendum)
Jerry [name] is here with his daughter for follow up.  He denies having pain or shortness of breath.  He denies having a cough or hemoptysis.  He also denies having any fatigue.    BP (!) 118/105 (BP Location: Right Arm, Patient Position: Sitting)   Pulse (!) 54   Temp 98.4 F (36.9 C) (Oral)   Ht 5\' 8"  (1.727 m)   Wt 185 lb 12.8 oz (84.3 kg)   SpO2 100%   BMI 28.25 kg/m    Wt Readings from Last 3 Encounters:  09/18/17 185 lb 12.8 oz (84.3 kg)  06/21/17 194 lb (88 kg)  06/12/17 194 lb (88 kg)

## 2017-09-18 NOTE — Progress Notes (Signed)
Radiation Oncology         [phone] ________________________________  Name: Jerry [name] MRN: [id number]  Date: 09/18/2017  DOB: 02/07/1936  Follow-Up Visit Note  CC: Marin Olp, MD  Marin Olp, MD    ICD-10-CM   1. Lung nodule R91.1   2. Cancer of upper lobe of right lung (HCC) C34.11 CT Chest Wo Contrast  3. Pulmonary emphysema, unspecified emphysema type (HCC)Chronic J43.9     Diagnosis:   Clinical Stage IA adenocarcinoma of the right upper lobe  Interval Since Last Radiation:  4 months, 05/01/17 - 05/09/17 : Right Lung treated to 54 Gy in 3 fractions with SBRT  Narrative:  Of note, since the patient last visit, he underwent tongue biopsy of left base on 06/21/2017 with results revealing: Tongue, biopsy, Left base. Benign squamous mucosa with underlying lymphoid tissue consistent with lingual tonsil. Actinomyces sulfur granules. No malignancy identified.   The patient returns today for routine follow-up of radiation completed on 05/09/17. He is accompanied by his daughter today. He notes that he has been doing well overall. Daughter states that she would like the patient to participate in the [facility] program. He states that he is remaining active as much as he can. Pt notes that he occasionally mows the yard with his riding lawnmower.   On review of systems, the patient denies CP, cough, SOB, or hemoptysis. Pt reports left sided neck pain s/p biopsy procedure. He denies sore throat.    ALLERGIES:  is allergic to namenda [memantine].  Meds: Current Outpatient Prescriptions  Medication Sig Dispense Refill  . amLODipine-olmesartan (AZOR) 5-40 MG tablet TAKE 1 TABLET BY MOUTH DAILY 30 tablet 5  . atorvastatin (LIPITOR) 40 MG tablet TAKE 1 TABLET(40 MG) BY MOUTH DAILY 30 tablet 5  . donepezil (ARICEPT) 5 MG tablet Take once a day 30 tablet 5  . Homeopathic Products ([employer] PINK EYE OP) Place 1 drop into both eyes 2 (two) times daily as needed (REDNESS).    .  magnesium citrate SOLN Take 1 Bottle by mouth daily as needed for severe constipation.     No current facility-administered medications for this encounter.    REVIEW OF SYSTEMS: A 10+ POINT REVIEW OF SYSTEMS WAS OBTAINED including neurology, dermatology, psychiatry, cardiac, respiratory, lymph, extremities, GI, GU, musculoskeletal, constitutional, reproductive, HEENT. All pertinent positives are noted in the HPI. All others are negative.  Physical Findings: The patient is in no acute distress. Patient is alert and oriented.  height is 5\' 8"  (1.727 m) and weight is 185 lb 12.8 oz (84.3 kg). His oral temperature is 98.4 F (36.9 C). His blood pressure is 118/105 (abnormal) and his pulse is 54 (abnormal). His oxygen saturation is 100%.  No significant changes. Lungs are clear to auscultation bilaterally. Heart has regular rate and rhythm. No palpable cervical, supraclavicular, or axillary adenopathy. Abdomen soft, non-tender, with normal bowel sounds.   Lab Findings: Lab Results  Component Value Date   WBC 6.0 06/21/2017   HGB 12.5 (L) 06/21/2017   HCT 38.5 (L) 06/21/2017   MCV 89.7 06/21/2017   PLT 181 06/21/2017    Radiographic Findings: No results found.  Impression: Doing well clinically stable s/p SBRT.   Plan:  Patient will proceed with CT Chest to evaluate response to SBRT. Routine follow up in 3 months   -----------------------------------  Blair Promise, PhD, MD  This document serves as a record of services personally performed by Gery Pray, MD. It was created on  his behalf by Steva Colder, a trained medical scribe. The creation of this record is based on the scribe's personal observations and the provider's statements to them. This document has been checked and approved by the attending provider.

## 2017-09-20 ENCOUNTER — Ambulatory Visit (HOSPITAL_COMMUNITY)
Admission: RE | Admit: 2017-09-20 | Discharge: 2017-09-20 | Disposition: A | Discharge status: Home / Self Care | Payer: Medicare Other | Source: Ambulatory Visit | Attending: Radiation Oncology | Admitting: Radiation Oncology

## 2017-09-20 DIAGNOSIS — I700 Atherosclerosis of aorta: Secondary | ICD-10-CM | POA: Insufficient documentation

## 2017-09-20 DIAGNOSIS — C3411 Malignant neoplasm of upper lobe, right bronchus or lung: Secondary | ICD-10-CM | POA: Insufficient documentation

## 2017-09-20 DIAGNOSIS — I2510 Atherosclerotic heart disease of native coronary artery without angina pectoris: Secondary | ICD-10-CM | POA: Diagnosis not present

## 2017-09-20 DIAGNOSIS — Z923 Personal history of irradiation: Secondary | ICD-10-CM | POA: Insufficient documentation

## 2017-09-20 DIAGNOSIS — J439 Emphysema, unspecified: Secondary | ICD-10-CM | POA: Diagnosis not present

## 2017-09-20 DIAGNOSIS — R918 Other nonspecific abnormal finding of lung field: Secondary | ICD-10-CM | POA: Diagnosis not present

## 2017-09-21 ENCOUNTER — Telehealth: Payer: Self-pay | Admitting: Oncology

## 2017-09-21 NOTE — Telephone Encounter (Signed)
Called patient's daughter and POA, Cristopher Estimable.  Notified her of good CT results per Dr. Sondra Come.  She verbalized understanding and will let her father now the results.

## 2017-09-21 NOTE — Telephone Encounter (Signed)
Patient's daughter, [name] left a message asking about the CT scan results from yesterday.  Requested a return call at [phone].

## 2017-09-22 ENCOUNTER — Telehealth: Payer: Self-pay | Admitting: Oncology

## 2017-09-22 NOTE — Telephone Encounter (Signed)
Patient's daughter, [name], called again regarding his CT scan results. Advised her the results were given yesterday to his POA, Cristopher Estimable, who said she would notify her father.  [name] put her father on the phone and he was notified of CT scan results and verbalized understanding.

## 2017-10-04 ENCOUNTER — Telehealth: Payer: Self-pay | Admitting: Family Medicine

## 2017-10-04 NOTE — Telephone Encounter (Signed)
Patient's [name] called to get patient scheduled for a follow up however, was not sure if the appointment was needed or if a physical was needed.   Patient told daughter that he missed a call, there was not a note in epic. Call [phone] to advise, okay to leave a detailed message.

## 2017-10-05 NOTE — Telephone Encounter (Signed)
Ok for next visit to be physical- may take longer to get him in

## 2017-10-06 NOTE — Telephone Encounter (Signed)
Called and left daughter a voicemail message

## 2017-10-19 ENCOUNTER — Encounter: Payer: Medicare Other | Admitting: Family Medicine

## 2017-10-20 ENCOUNTER — Other Ambulatory Visit: Payer: Self-pay | Admitting: Family Medicine

## 2017-10-20 DIAGNOSIS — E785 Hyperlipidemia, unspecified: Secondary | ICD-10-CM

## 2017-11-06 ENCOUNTER — Encounter: Payer: Medicare Other | Admitting: Family Medicine

## 2017-11-08 DIAGNOSIS — H401132 Primary open-angle glaucoma, bilateral, moderate stage: Secondary | ICD-10-CM | POA: Diagnosis not present

## 2017-11-23 ENCOUNTER — Ambulatory Visit (INDEPENDENT_AMBULATORY_CARE_PROVIDER_SITE_OTHER): Payer: Medicare Other | Admitting: Family Medicine

## 2017-11-23 ENCOUNTER — Encounter: Payer: Self-pay | Admitting: Family Medicine

## 2017-11-23 ENCOUNTER — Telehealth: Payer: Self-pay

## 2017-11-23 VITALS — BP 118/60 | HR 61 | Temp 97.4°F | Ht 68.0 in | Wt 193.4 lb

## 2017-11-23 DIAGNOSIS — J439 Emphysema, unspecified: Secondary | ICD-10-CM

## 2017-11-23 DIAGNOSIS — I10 Essential (primary) hypertension: Secondary | ICD-10-CM

## 2017-11-23 DIAGNOSIS — F03B0 Unspecified dementia, moderate, without behavioral disturbance, psychotic disturbance, mood disturbance, and anxiety: Secondary | ICD-10-CM

## 2017-11-23 DIAGNOSIS — F0390 Unspecified dementia without behavioral disturbance: Secondary | ICD-10-CM | POA: Diagnosis not present

## 2017-11-23 DIAGNOSIS — C3411 Malignant neoplasm of upper lobe, right bronchus or lung: Secondary | ICD-10-CM | POA: Diagnosis not present

## 2017-11-23 DIAGNOSIS — E7849 Other hyperlipidemia: Secondary | ICD-10-CM | POA: Diagnosis not present

## 2017-11-23 DIAGNOSIS — Z0000 Encounter for general adult medical examination without abnormal findings: Secondary | ICD-10-CM | POA: Diagnosis not present

## 2017-11-23 LAB — CBC WITH DIFFERENTIAL/PLATELET
BASOS ABS: 0.1 10*3/uL (ref 0.0–0.1)
BASOS PCT: 0.7 % (ref 0.0–3.0)
EOS ABS: 0.1 10*3/uL (ref 0.0–0.7)
Eosinophils Relative: 1 % (ref 0.0–5.0)
HEMATOCRIT: 40.6 % (ref 39.0–52.0)
HEMOGLOBIN: 13.1 g/dL (ref 13.0–17.0)
LYMPHS PCT: 35.7 % (ref 12.0–46.0)
Lymphs Abs: 2.7 10*3/uL (ref 0.7–4.0)
MCHC: 32.3 g/dL (ref 30.0–36.0)
MCV: 91.9 fl (ref 78.0–100.0)
MONO ABS: 0.5 10*3/uL (ref 0.1–1.0)
Monocytes Relative: 6.4 % (ref 3.0–12.0)
Neutro Abs: 4.2 10*3/uL (ref 1.4–7.7)
Neutrophils Relative %: 56.2 % (ref 43.0–77.0)
Platelets: 185 10*3/uL (ref 150.0–400.0)
RBC: 4.42 Mil/uL (ref 4.22–5.81)
RDW: 14.7 % (ref 11.5–15.5)
WBC: 7.5 10*3/uL (ref 4.0–10.5)

## 2017-11-23 LAB — COMPREHENSIVE METABOLIC PANEL
ALBUMIN: 4 g/dL (ref 3.5–5.2)
ALT: 29 U/L (ref 0–53)
AST: 23 U/L (ref 0–37)
Alkaline Phosphatase: 80 U/L (ref 39–117)
BILIRUBIN TOTAL: 0.5 mg/dL (ref 0.2–1.2)
BUN: 19 mg/dL (ref 6–23)
CALCIUM: 9.7 mg/dL (ref 8.4–10.5)
CHLORIDE: 110 meq/L (ref 96–112)
CO2: 28 mEq/L (ref 19–32)
CREATININE: 1.29 mg/dL (ref 0.40–1.50)
GFR: 69.03 mL/min (ref >60.00)
Glucose, Bld: 86 mg/dL (ref 70–99)
Potassium: 4.5 mEq/L (ref 3.5–5.1)
SODIUM: 143 meq/L (ref 135–145)
Total Protein: 7.3 g/dL (ref 6.0–8.3)

## 2017-11-23 LAB — LDL CHOLESTEROL, DIRECT: LDL DIRECT: 65 mg/dL

## 2017-11-23 NOTE — Assessment & Plan Note (Signed)
he remains on aricept. MMSE 6 months ago was 16/30. Has not tolerated namenda due to dizziness. Encouraged him to have family member with him at each visit- will write in on avs as well for family

## 2017-11-23 NOTE — Assessment & Plan Note (Signed)
Lung cancer- radiation treatment opted for given memory changes over surgery- he has done well and continues to follow up - last CT showed slight improvement in size. Has follow up in January

## 2017-11-23 NOTE — Assessment & Plan Note (Signed)
update lipids- LDL was 150 last check but thought to be compliance issue and he had pill box set up

## 2017-11-23 NOTE — Progress Notes (Signed)
Phone: [phone]  Subjective:  Patient presents today for their annual physical. Chief complaint-noted.   See problem oriented charting- ROS- full  review of systems was limited due to level 5 caveat with dementia. He does deny symptoms such as chest pain, headache, blurry vision. Admits to some confusion/memory issues.   The following were reviewed and entered/updated in epic: Past Medical History:  Diagnosis Date  . Cancer of prostate ([place]) 2001  . Chronic kidney disease (CKD), stage III (moderate) ([place])    [name] 02/08/2017  . Dementia   . DIVERTICULOSIS, COLON 07/25/2007   Qualifier: Diagnosis of  By: [name], LPN, [name]   . High cholesterol   . History of radiation therapy 05/01/17-05/09/17   right lung 54 Gy in 3 fractions  . Hypertension   . Lung cancer [facility])    Patient Active Problem List   Diagnosis Date Noted  . Cancer of upper lobe of right lung ([place]) 09/18/2017    Priority: High  . Moderate dementia without behavioral disturbance 07/17/2015    Priority: High  . CKD (chronic kidney disease) stage 3, GFR 30-59 ml/min (HCC) 02/08/2017    Priority: Medium  . Hyperlipidemia 10/15/2014    Priority: Medium  . Gout of big toe 08/04/2008    Priority: Medium  . Essential hypertension 07/25/2007    Priority: Medium  . PROSTATE CANCER, HX OF 07/25/2007    Priority: Medium  . Allergic rhinitis 10/15/2014    Priority: Low  . Overactive bladder 10/15/2014    Priority: Low  . CARDIAC MURMUR 01/10/2011    Priority: Low  . ORGANIC IMPOTENCE 08/12/2010    Priority: Low  . Exertional dyspnea 03/15/2017  . Pulmonary emphysema ([place])   . Parotiditis 02/08/2017  . Facial swelling 02/08/2017  . Lung nodule 02/08/2017  . Submandibular space infection    Past Surgical History:  Procedure Laterality Date  . DIRECT LARYNGOSCOPY N/A 06/21/2017   Procedure: DIRECT LARYNGOSCOPY;  Surgeon: Melissa Montane, MD;  Location: [place];  Service: ENT;  Laterality: N/A;  . EYE SURGERY    .  INSERTION PROSTATE RADIATION SEED  07/2000   [name] 05/09/2011  . PENILE PROSTHESIS IMPLANT  02/2002   Insertion of Mentor three piece inflatable penile prosthesis./notes 05/09/2011  . PENILE PROSTHESIS PLACEMENT  06/2003   Placement of Mentor rod prosthesis/notes 05/09/2011  . REFRACTIVE SURGERY Bilateral   . REMOVAL OF PENILE PROSTHESIS  06/2003   Removal of three-piece Mentor prosthesis/notes 05/09/2011  . SCROTAL SURGERY  06/2003   Repair of scrotal defect/notes 05/09/2011    Family History  Problem Relation Age of Onset  . Early death Father        cirrhosis  . Alcoholism Father   . Cirrhosis Unknown   . Breast cancer Sister     Medications- reviewed and updated Current Outpatient Medications  Medication Sig Dispense Refill  . amLODipine-olmesartan (AZOR) 5-40 MG tablet TAKE 1 TABLET BY MOUTH DAILY 30 tablet 5  . atorvastatin (LIPITOR) 40 MG tablet TAKE 1 TABLET(40 MG) BY MOUTH DAILY 30 tablet 5  . donepezil (ARICEPT) 5 MG tablet Take once a day 30 tablet 5  . Homeopathic Products ([employer] PINK EYE OP) Place 1 drop into both eyes 2 (two) times daily as needed (REDNESS).    . magnesium citrate SOLN Take 1 Bottle by mouth daily as needed for severe constipation.     No current facility-administered medications for this visit.     Allergies-reviewed and updated Allergies  Allergen Reactions  .  Namenda [Memantine] Other (See Comments)    Made pt feel dizzy    Social History   Socioeconomic History  . Marital status: Widowed    Spouse name: None  . Number of children: None  . Years of education: None  . Highest education level: None  Social Needs  . Financial resource strain: None  . Food insecurity - worry: None  . Food insecurity - inability: None  . Transportation needs - medical: None  . Transportation needs - non-medical: None  Occupational History  . None  Tobacco Use  . Smoking status: Former Smoker    Packs/day: 0.50    Last attempt to quit: 12/26/1988     Years since quitting: 28.9  . Smokeless tobacco: Never Used  . Tobacco comment: "I don't remember how long really" (02/08/2017)  Substance and Sexual Activity  . Alcohol use: No  . Drug use: No  . Sexual activity: Not Currently  Other Topics Concern  . None  Social History Narrative   Separated since 2010. 4 kids. 4 grandkids. 2 greatgrandkids.       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 Dementia unfortunately stopped these travels.     Objective: BP 118/60 (BP Location: Left Arm, Patient Position: Sitting, Cuff Size: Large)   Pulse 61   Temp (!) 97.4 F (36.3 C) (Oral)   Ht 5\' 8"  (1.727 m)   Wt 193 lb 6.4 oz (87.7 kg)   SpO2 98%   BMI 29.41 kg/m  Gen: NAD, resting comfortably HEENT: Mucous membranes are moist. Oropharynx normal Neck: no thyromegaly CV: RRR stable 3/6 systolic ejection murmur- noted to 2007 Lungs: CTAB no crackles, wheeze, rhonchi Abdomen: soft/nontender/nondistended/normal bowel sounds. overweight Ext: no edema Skin: warm, dry Neuro: grossly normal, moves all extremities, PERRLA  Rectal deferred to urology  Assessment/Plan:  81 y.o. male presenting for annual physical.  Health Maintenance counseling: 1. Anticipatory guidance: Patient counseled regarding regular dental exams -q6 months advised- he has not been going regularly- worried about finances- still brushing and flossing, eye exams -yearly, wearing seatbelts.  2. Risk factor reduction:  Advised patient of need for regular exercise and diet rich and fruits and vegetables to reduce risk of heart attack and stroke. Exercise- advised 150 minutes a week- also good for his mental state. Diet-weight  Actually down 10 lbs from last visit here in April. He states he has backed off of some heavier foods- eased off on baking cakes for example Wt Readings from Last 3 Encounters:  11/23/17 193 lb 6.4 oz (87.7 kg)  09/18/17 185 lb 12.8 oz (84.3 kg)  06/21/17 194  lb (88 kg)  3. Immunizations/screenings/ancillary studies- patient not sure if he had flu shot and is alone today- we will wait to get info from family. Tdap and shingrix only covered at pharmacy Immunization History  Administered Date(s) Administered  . Influenza Whole 11/06/2007, 10/07/2008, 12/09/2010  . Influenza, High Dose Seasonal PF 08/22/2016  . Influenza,inj,Quad PF,6+ Mos 10/15/2014  . Pneumococcal Conjugate-13 05/08/2015  . Pneumococcal Polysaccharide-23 11/06/2007  . Td 12/26/2006  4. Prostate cancer screening- follows with [facility] given history of prostate cancer. Last seen a year ago- encouraged him to follow up- would love Dr. Amalia Hailey recommendations in light of other medical issues if we could stop PSA testing.  Lab Results  Component Value Date   PSA 0.05 (L) 08/01/2016   5. Colon cancer screening - never had colonoscopy- given his dementia would  not pursue unless symptomatic such as blood in stool    Status of chronic or acute concerns   Moderate dementia without behavioral disturbance he remains on aricept. MMSE 6 months ago was 16/30. Has not tolerated namenda due to dizziness. Encouraged him to have family member with him at each visit- will write in on avs as well for family  Cancer of upper lobe of right lung ([place]) Lung cancer- radiation treatment opted for given memory changes over surgery- he has done well and continues to follow up - last CT showed slight improvement in size. Has follow up in January  Essential hypertension HTN- controlled on amlodipine- olmesartan 5-40mg   Hyperlipidemia update lipids- LDL was 150 last check but thought to be compliance issue and he had pill box set up  Pulmonary emphysema (HCC) Noted by pulmonary. Not on inhalers and appears to be doing reasonably well after radiation for lung cancer  Future Appointments  Date Time Provider [place]  01/01/2018 10:15 AM Gery Pray, MD [facility] None   6 months  Orders  Placed This Encounter  Procedures  . CBC with Differential/Platelet  . Comprehensive metabolic panel    [place]  . LDL cholesterol, direct    [place]  nonfasting labs  Return precautions advised.  Garret Reddish, MD

## 2017-11-23 NOTE — Assessment & Plan Note (Signed)
HTN- controlled on amlodipine- olmesartan 5-40mg 

## 2017-11-23 NOTE — Telephone Encounter (Signed)
Copied from [place]. Topic: General - Other >> Nov 23, 2017  2:20 PM Aurelio Brash B wrote: Jerry [name] pts daughter asks to get a call back  from nurse or dr hunter about her fathers apt today,  she wanted to come with pt today but had a job interview and could not.  I do not see Jerry [name] on the pts dpr  Her number is [phone]  she also wants the office staff to know her father has dementia

## 2017-11-23 NOTE — Assessment & Plan Note (Signed)
Noted by pulmonary. Not on inhalers and appears to be doing reasonably well after radiation for lung cancer

## 2017-11-23 NOTE — Patient Instructions (Addendum)
Please stop by lab before you go. nonfasting labs Please have your daughter let us know if you got a flu shot- we would like to give you a flu shot if you have not had one.   Blood pressure looks great  It has been one year since your urology follow up with Dr. Amalia Hailey- please call him for follow up- I would like his opinion if it would be ok to stop PSA testing given your other health issues. We did not order a psa today so that decision could be made with him.

## 2017-11-30 ENCOUNTER — Other Ambulatory Visit: Payer: Self-pay | Admitting: Family Medicine

## 2017-12-13 NOTE — Telephone Encounter (Signed)
LM to return call.

## 2017-12-27 NOTE — Telephone Encounter (Signed)
Anselmo Pickler had spoken with the daughter [name] on 11/24/17

## 2017-12-28 ENCOUNTER — Telehealth: Payer: Self-pay | Admitting: Family Medicine

## 2017-12-28 NOTE — Telephone Encounter (Signed)
See note

## 2017-12-28 NOTE — Telephone Encounter (Signed)
Not sure this would be covered as we did not have a face to face about needs for nursing services. We likely would likely need to have him in for a visit for home health to cover this.

## 2017-12-28 NOTE — Telephone Encounter (Signed)
Copied from [place] [id number]. Topic: Quick Communication - See Telephone Encounter >> Dec 28, 2017  3:58 PM Ivar Drape wrote: CRM for notification. See Telephone encounter for:  12/28/17. Patient's daughter, Janie Strothman, would like a referral for an agency to send someone to the house to make sure the patient is taking his meds. Please advise  [phone].

## 2017-12-29 NOTE — Telephone Encounter (Signed)
The other daughter zion ta called to request this same thing.  She will be coming from [place] this next week to help pt gets all this in order. She is hoping you could make an exception and reorder the assessment without pt being seen because it is so difficult to get pt in to the office. Pt just had annual 11/23/2017.  Pt was ordered the home health earlier in the year, but being the person he is now, cancelled the people and told them not to come.   Pt has been dx by a neurologist with dementia, and now pt has declined so that he is not bathing daily, taking his meds, and is declining rapidly. Please advise.  The daughter that lives here now has limited availability to get to pt's home so hard to check on him. [phone]  Also, hyman crossan, the first daughter who called the first time is not on the [facility].  They are hoping to get assessment while she is her next week, 1/9, 1/10, or 1/11

## 2018-01-01 ENCOUNTER — Ambulatory Visit: Payer: Medicare Other | Admitting: Radiation Oncology

## 2018-01-01 NOTE — Telephone Encounter (Signed)
I do not believe insurance will cover this. She may call to ask them/insurance but I will not be able to say I did a "face to face" visit about these needs. I do not mind writing it - the issue is whether it will be covered or not

## 2018-01-03 NOTE — Telephone Encounter (Signed)
Please contact patient regarding Dr. Ansel Bong update on the note below.

## 2018-01-03 NOTE — Telephone Encounter (Signed)
Copied from [place] [id number]. Topic: Quick Communication - See Telephone Encounter >> Dec 28, 2017  3:58 PM Ivar Drape wrote: CRM for notification. See Telephone encounter for:  12/28/17. >> Jan 03, 2018  5:02 PM Cleaster Corin, [place] wrote: Pt. Daughter Minerva Fester calling back to seeif pt. Can get in to see Dr. Yong Channel would be able to see pt. On Friday. Cristopher Estimable can be reached at [phone]

## 2018-01-04 ENCOUNTER — Ambulatory Visit: Payer: Medicare Other | Admitting: Family Medicine

## 2018-01-04 NOTE — Telephone Encounter (Signed)
Tried to contact daughter to let her know that Dr. Yong Channel does not have availability tomorrow. She did not answer and her mailbox is full so I was unable to leave a message

## 2018-01-05 ENCOUNTER — Encounter: Payer: Self-pay | Admitting: Family Medicine

## 2018-01-05 ENCOUNTER — Ambulatory Visit (INDEPENDENT_AMBULATORY_CARE_PROVIDER_SITE_OTHER): Payer: Medicare Other | Admitting: Family Medicine

## 2018-01-05 ENCOUNTER — Telehealth: Payer: Self-pay | Admitting: Family Medicine

## 2018-01-05 VITALS — BP 136/62 | HR 56 | Temp 97.6°F | Ht 68.0 in | Wt 193.2 lb

## 2018-01-05 DIAGNOSIS — C3411 Malignant neoplasm of upper lobe, right bronchus or lung: Secondary | ICD-10-CM | POA: Diagnosis not present

## 2018-01-05 DIAGNOSIS — F0390 Unspecified dementia without behavioral disturbance: Secondary | ICD-10-CM | POA: Diagnosis not present

## 2018-01-05 DIAGNOSIS — N183 Chronic kidney disease, stage 3 unspecified: Secondary | ICD-10-CM

## 2018-01-05 DIAGNOSIS — E7849 Other hyperlipidemia: Secondary | ICD-10-CM

## 2018-01-05 DIAGNOSIS — I700 Atherosclerosis of aorta: Secondary | ICD-10-CM

## 2018-01-05 DIAGNOSIS — I10 Essential (primary) hypertension: Secondary | ICD-10-CM | POA: Diagnosis not present

## 2018-01-05 DIAGNOSIS — F03B0 Unspecified dementia, moderate, without behavioral disturbance, psychotic disturbance, mood disturbance, and anxiety: Secondary | ICD-10-CM

## 2018-01-05 DIAGNOSIS — J439 Emphysema, unspecified: Secondary | ICD-10-CM

## 2018-01-05 NOTE — Telephone Encounter (Signed)
Spoke with Dr Yong Channel and he agreed to see patient and daughter at 3:00 today. I explained to daughter that the appointment is half of the time the prior appointment had been scheduled so they do need to arrive 15 minutes early. Daughter would also like application form filled out for [employer]. I again reiterated appointment length.  Daughter states understanding. I also explained if this appointment gets cancelled they will not be able to be worked in on Monday.

## 2018-01-05 NOTE — Patient Instructions (Signed)
Filled out form for SCAT as well as submitted home health orders for nursing and home health aide

## 2018-01-05 NOTE — Progress Notes (Signed)
Subjective:  Jerry [name] is a 82 y.o. year old very pleasant male patient who presents for/with See problem oriented charting ROS- continued issues with memory. No reported chest pain or shortness of breath- able to walk but family does not think it is safe for patient to walk to stores/locations in case he were to have confusion. No leg pain with walking.    Past Medical History-  Patient Active Problem List   Diagnosis Date Noted  . Cancer of upper lobe of right lung ([place]) 09/18/2017    Priority: High  . Moderate dementia without behavioral disturbance 07/17/2015    Priority: High  . Hyperlipidemia 10/15/2014    Priority: Medium  . Gout of big toe 08/04/2008    Priority: Medium  . Essential hypertension 07/25/2007    Priority: Medium  . PROSTATE CANCER, HX OF 07/25/2007    Priority: Medium  . Aortic atherosclerosis ([place]) 01/06/2018    Priority: Low  . Pulmonary emphysema ([place])     Priority: Low  . Allergic rhinitis 10/15/2014    Priority: Low  . Overactive bladder 10/15/2014    Priority: Low  . CARDIAC MURMUR 01/10/2011    Priority: Low  . ORGANIC IMPOTENCE 08/12/2010    Priority: Low  . Exertional dyspnea 03/15/2017  . Parotiditis 02/08/2017  . Facial swelling 02/08/2017  . Lung nodule 02/08/2017  . Submandibular space infection     Medications- reviewed and updated Current Outpatient Medications  Medication Sig Dispense Refill  . amLODipine-olmesartan (AZOR) 5-40 MG tablet TAKE 1 TABLET BY MOUTH DAILY 30 tablet 5  . atorvastatin (LIPITOR) 40 MG tablet TAKE 1 TABLET(40 MG) BY MOUTH DAILY 30 tablet 5  . donepezil (ARICEPT) 5 MG tablet Take once a day 30 tablet 5  . Homeopathic Products ([employer] PINK EYE OP) Place 1 drop into both eyes 2 (two) times daily as needed (REDNESS).    . magnesium citrate SOLN Take 1 Bottle by mouth daily as needed for severe constipation.     No current facility-administered medications for this visit.     Objective: BP 136/62 (BP  Location: Left Arm, Patient Position: Sitting, Cuff Size: Large)   Pulse (!) 56   Temp 97.6 F (36.4 C) (Oral)   Ht 5\' 8"  (1.727 m)   Wt 193 lb 3.2 oz (87.6 kg)   SpO2 97%   BMI 29.38 kg/m  Gen: NAD, resting comfortably CV: RRR 3/6 SEM Lungs: CTAB no crackles, wheeze, rhonchi Abdomen: soft/nontender/nondistended/normal bowel sounds. No rebound or guarding.  Ext: no edema Skin: warm, dry Neuro: enjoys conversation- confusion noted though  Assessment/Plan:  Moderate dementia without behavioral disturbance S:  Patient continues to have issues with memory. Family is concerned about meal preparation, him taking medicines appropriately, personal care including bathing.   Previously, had been referred to have nursing/aide services. Family thought they could handle needs and later turned services down but daughter that could drive that was able to help a fair amount has unfortunately died. Other daughter that lives locally does not drive and depends on patients granddaughter for support. Daughter from [place] in town trying to help her father get affairs arranged.  A/P: moderate dementia- spent time explaining to patient that symptoms were not likely to improve- he is asking about driving again which I advised against -SCAT bus forms filled out -refer to home health for nursing/aide services- from submission "hopeful to help with at least meal prep twice a day to make sure he is eating. Also pill box set  up at least weekly and reminders to take his pills due to questions of compliance." - working on social services for daily services and potentially a center he could go to during the day - [facility] most recently 16/30. Continue aricept 5mg . Did not tolerate namenda due to dizziness.   Essential hypertension S: controlled on amlodipine-olmesartan 5-40mg .  BP Readings from Last 3 Encounters:  01/05/18 136/62  11/23/17 118/60  09/18/17 (!) 118/105  A/P: We discussed blood pressure goal of  <140/90. Continue current meds   Hyperlipidemia S: reasonably controlled on atorvastatin 40mg  with last LDL of 65  A/P: as dementia progresses may consider having conversation about stopping statin   CKD (chronic kidney disease) stage 3, GFR 30-59 ml/min (HCC) I will resolve this issue- recheck of GFR shows consistently above 60 GFR on our office readings.   Cancer of upper lobe of right lung [facility]) Completed radiation treatment over surgery given memory changes. His report showed  "1. Treated peripheral right upper lobe pulmonary nodule is mildly decreased in size. 2. New bandlike opacity surrounding the treated nodule in the right upper lobe, compatible with early radiation change ." Will not change title from cancer of right lung unless Dr. Sondra Come specifically notes this  Aortic atherosclerosis ([place]) Noted on CT chest most recently. We will continue to treat individual risk factors for worsening/progression. Thankful no aneurysm  Pulmonary emphysema ([place]) Once again- noted by pulmonary but no regular issues and not using inhaler. Also noted on CT chest. Thankful no shortness of breath or wheeze   Future Appointments  Date Time Provider [place]  01/11/2018 11:45 AM Gery Pray, MD [facility] None  05/17/2018  9:30 AM Yong Channel Brayton Mars, MD [facility]    Face to face completed electronically Orders Placed This Encounter  Procedures  . Ambulatory referral to Home Health    Referral Priority:   Routine    Referral Type:   Home Health Care    Referral Reason:   Specialty Services Required    Requested Specialty:   [place]    Number of Visits Requested:   1   Return precautions advised.  Garret Reddish, MD

## 2018-01-05 NOTE — Telephone Encounter (Signed)
Patient is scheduled for today 01/05/18 at 3:00

## 2018-01-05 NOTE — Telephone Encounter (Signed)
Copied from [place] #[id number]. Topic: Quick Communication - Office Called Patient >> Jan 04, 2018  5:25 PM [place], [place], [place] wrote: Reason for CRM: Tried to contact daughter to let her know that Dr. Yong Channel does not have availability tomorrow. She did not answer and her mailbox is full so I was unable to leave a message

## 2018-01-05 NOTE — Telephone Encounter (Signed)
Contacted pt's daughter, Jerry [name], to relay the message per Dierdre Searles and CRM [phone] dated 01/04/18; she states that it was her understanding that the pt has to be seen by his PCP Garret Reddish in order to receive home care benefits and SCAT, and that she rearranged her trip back to [place] based on getting an appointment; conference call initiated with Cassie at [facility] regarding this issue; pt already has an appointment scheduled for 01/05/18 at 1500; Cassie did confer with Dr Yong Channel and he is scheduled for the previously listed date and time. Jerry [name] is going to bring in the paperwork and understand that Dr Yong Channel has limited time and appreciates any help that can be given.  Copied from [place] #[id number]. Topic: Quick Communication - Office Called Patient >> Jan 04, 2018  5:25 PM [place], [place], [place] wrote: Reason for CRM: Tried to contact daughter to let her know that Dr. Yong Channel does not have availability tomorrow. She did not answer and her mailbox is full so I was unable to leave a message

## 2018-01-06 DIAGNOSIS — I700 Atherosclerosis of aorta: Secondary | ICD-10-CM | POA: Insufficient documentation

## 2018-01-06 NOTE — Assessment & Plan Note (Signed)
Noted on CT chest most recently. We will continue to treat individual risk factors for worsening/progression. Thankful no aneurysm

## 2018-01-06 NOTE — Assessment & Plan Note (Signed)
Once again- noted by pulmonary but no regular issues and not using inhaler. Also noted on CT chest. Thankful no shortness of breath or wheeze

## 2018-01-06 NOTE — Assessment & Plan Note (Signed)
S: reasonably controlled on atorvastatin 40mg  with last LDL of 65  A/P: as dementia progresses may consider having conversation about stopping statin

## 2018-01-06 NOTE — Assessment & Plan Note (Signed)
I will resolve this issue- recheck of GFR shows consistently above 60 GFR on our office readings.

## 2018-01-06 NOTE — Assessment & Plan Note (Signed)
S: controlled on amlodipine-olmesartan 5-40mg .  BP Readings from Last 3 Encounters:  01/05/18 136/62  11/23/17 118/60  09/18/17 (!) 118/105  A/P: We discussed blood pressure goal of <140/90. Continue current meds

## 2018-01-06 NOTE — Assessment & Plan Note (Signed)
Completed radiation treatment over surgery given memory changes. His report showed  "1. Treated peripheral right upper lobe pulmonary nodule is mildly decreased in size. 2. New bandlike opacity surrounding the treated nodule in the right upper lobe, compatible with early radiation change ." Will not change title from cancer of right lung unless Dr. Sondra Come specifically notes this

## 2018-01-06 NOTE — Assessment & Plan Note (Addendum)
S:  Patient continues to have issues with memory. Family is concerned about meal preparation, him taking medicines appropriately, personal care including bathing.   Previously, had been referred to have nursing/aide services. Family thought they could handle needs and later turned services down but daughter that could drive that was able to help a fair amount has unfortunately died. Other daughter that lives locally does not drive and depends on patients granddaughter for support. Daughter from [place] in town trying to help her father get affairs arranged.  A/P: moderate dementia- spent time explaining to patient that symptoms were not likely to improve- he is asking about driving again which I advised against -SCAT bus forms filled out -refer to home health for nursing/aide services- from submission "hopeful to help with at least meal prep twice a day to make sure he is eating. Also pill box set up at least weekly and reminders to take his pills due to questions of compliance." - working on social services for daily services and potentially a center he could go to during the day - [facility] most recently 16/30. Continue aricept 5mg . Did not tolerate namenda due to dizziness.

## 2018-01-11 ENCOUNTER — Ambulatory Visit
Admission: RE | Admit: 2018-01-11 | Discharge: 2018-01-11 | Disposition: A | Discharge status: Home / Self Care | Payer: Medicare Other | Source: Ambulatory Visit | Attending: Radiation Oncology | Admitting: Radiation Oncology

## 2018-01-11 ENCOUNTER — Telehealth: Payer: Self-pay | Admitting: Family Medicine

## 2018-01-11 ENCOUNTER — Other Ambulatory Visit: Payer: Self-pay

## 2018-01-11 ENCOUNTER — Encounter: Payer: Self-pay | Admitting: Radiation Oncology

## 2018-01-11 DIAGNOSIS — Z923 Personal history of irradiation: Secondary | ICD-10-CM | POA: Insufficient documentation

## 2018-01-11 DIAGNOSIS — C3411 Malignant neoplasm of upper lobe, right bronchus or lung: Secondary | ICD-10-CM | POA: Insufficient documentation

## 2018-01-11 DIAGNOSIS — Z08 Encounter for follow-up examination after completed treatment for malignant neoplasm: Secondary | ICD-10-CM | POA: Diagnosis not present

## 2018-01-11 DIAGNOSIS — Z79899 Other long term (current) drug therapy: Secondary | ICD-10-CM | POA: Diagnosis not present

## 2018-01-11 NOTE — Progress Notes (Signed)
  Radiation Oncology         [phone] ________________________________  Name: Jerry [name] MRN: [id number]  Date: 01/11/2018  DOB: 07/21/1936  Follow-Up Visit Note  CC: Marin Olp, MD  Marin Olp, MD    ICD-10-CM   1. Cancer of upper lobe of right lung (HCC) C34.11     Diagnosis:   Clinical Stage IA adenocarcinoma of the right upper lobe  Interval Since Last Radiation:  8 months, 05/01/17 - 05/09/17 : Right Lung treated to 54 Gy in 3 fractions with SBRT  Narrative:  The patient returns today for routine follow-up of radiation completed on 05/09/17. He is accompanied by his daughter today. He notes that he has been doing well overall. He reports a good appetite and energy level. Chest CT on 09/20/17 shows treated peripheral right upper lobe pulmonary nodule is mildly decreased in size. New bandlike opacity surrounding the treated nodule in the right upper lobe, compatible with early radiation change.   On review of systems, the patient denies CP, cough, SOB, or hemoptysis.    ALLERGIES:  is allergic to namenda [memantine].  Meds: Current Outpatient Medications  Medication Sig Dispense Refill  . amLODipine-olmesartan (AZOR) 5-40 MG tablet TAKE 1 TABLET BY MOUTH DAILY 30 tablet 5  . atorvastatin (LIPITOR) 40 MG tablet TAKE 1 TABLET(40 MG) BY MOUTH DAILY 30 tablet 5  . donepezil (ARICEPT) 5 MG tablet Take once a day 30 tablet 5  . Homeopathic Products ([employer] PINK EYE OP) Place 1 drop into both eyes 2 (two) times daily as needed (REDNESS).    . magnesium citrate SOLN Take 1 Bottle by mouth daily as needed for severe constipation.     No current facility-administered medications for this encounter.    REVIEW OF SYSTEMS: A 10+ POINT REVIEW OF SYSTEMS WAS OBTAINED including neurology, dermatology, psychiatry, cardiac, respiratory, lymph, extremities, GI, GU, musculoskeletal, constitutional, reproductive, HEENT. All pertinent positives are noted in the HPI. All others are  negative.  Physical Findings: The patient is in no acute distress. Patient is alert and oriented.  height is 5\' 3"  (1.6 m) and weight is 195 lb (88.5 kg). His oral temperature is 97.8 F (36.6 C). His blood pressure is 150/90 (abnormal) and his pulse is 56 (abnormal). His oxygen saturation is 100%.  No significant changes. Lungs are clear to auscultation bilaterally. Heart has regular rate and rhythm. No palpable cervical, supraclavicular, or axillary adenopathy. Abdomen soft, non-tender, with normal bowel sounds.   Lab Findings: Lab Results  Component Value Date   WBC 7.5 11/23/2017   HGB 13.1 11/23/2017   HCT 40.6 11/23/2017   MCV 91.9 11/23/2017   PLT 185.0 11/23/2017    Radiographic Findings: No results found.  Impression: The patient is doing well and he is clinically stable s/p SBRT.  Plan:  Patient will proceed with CT Chest to evaluate response to SBRT. Routine follow up in 3 months   -----------------------------------  Blair Promise, PhD, MD  This document serves as a record of services personally performed by Gery Pray, MD. It was created on his behalf by Bethann Humble, a trained medical scribe. The creation of this record is based on the scribe's personal observations and the provider's statements to them. This document has been checked and approved by the attending provider.

## 2018-01-11 NOTE — Progress Notes (Signed)
Jerry [name] is here for follow up after treatment to his right lung.  He denies having pain, shortness of breath or a cough.  He reports having a good appetite and energy level.  BP (!) 150/90 (BP Location: Left Arm, Patient Position: Sitting)   Pulse (!) 56   Temp 97.8 F (36.6 C) (Oral)   Ht 5\' 3"  (1.6 m)   Wt 195 lb (88.5 kg)   SpO2 100%   BMI 34.54 kg/m   Wt Readings from Last 3 Encounters:  01/11/18 195 lb (88.5 kg)  01/05/18 193 lb 3.2 oz (87.6 kg)  11/23/17 193 lb 6.4 oz (87.7 kg)

## 2018-01-11 NOTE — Telephone Encounter (Signed)
Copied from [place] [id number]. Topic: General - Other >> Jan 11, 2018  2:46 PM Lolita Rieger, RMA wrote: Reason for CRM: Beth from [facility] called and would like orders for a Education officer, museum and nursing care for the pt  Please call Beth [phone] 

## 2018-01-12 NOTE — Telephone Encounter (Signed)
Dr. Yong Channel please see message and advise if okay to give orders.

## 2018-01-12 NOTE — Telephone Encounter (Signed)
Yes thanks 

## 2018-01-15 NOTE — Telephone Encounter (Signed)
Copied from [place] [id number]. Topic: General - Other >> Jan 15, 2018  2:21 PM Carolyn Stare wrote:  Jerry [name] with [facility]  call to say she went out to see the and would like a call back to discuss her findings   [phone]

## 2018-01-15 NOTE — Telephone Encounter (Signed)
Called and spoke to [facility] who states Nursing is out there now to do an assessment and then nurse will likely recommend social worker. They will let us know what the nurse is recommending

## 2018-01-16 NOTE — Telephone Encounter (Signed)
Agree his situation is not ideal- wish he would have accepted home health aid.

## 2018-01-16 NOTE — Telephone Encounter (Signed)
Spoke with nurse who went to home yesterday to complete assessment. She met with the niece who lives in [place]. Patient refused to have an aide come in to help with ADLs but they are ordering Nursing services and social work. Nurse states family is still grieving loss of daughter who took primary care of patient. Family is working on managing meds and providing meals for patient. Nurse spoke of concerns of patient living by himself. She will address concerns with social worker

## 2018-01-18 ENCOUNTER — Telehealth: Payer: Self-pay | Admitting: *Deleted

## 2018-01-18 NOTE — Telephone Encounter (Signed)
Called patient to inform of CT for 01-22-18 - arrival time - 3:45 pm @ [facility], no restrictions to test, and his fu with Dr. Sondra Come on 04-12-18 @ 11:30 am, spoke with patient's daughter - Jerry [name], and she is aware of these appts.

## 2018-01-22 ENCOUNTER — Encounter (HOSPITAL_COMMUNITY): Payer: Self-pay

## 2018-01-22 ENCOUNTER — Ambulatory Visit (HOSPITAL_COMMUNITY)
Admission: RE | Admit: 2018-01-22 | Discharge: 2018-01-22 | Disposition: A | Discharge status: Home / Self Care | Payer: Medicare Other | Source: Ambulatory Visit | Attending: Radiation Oncology | Admitting: Radiation Oncology

## 2018-01-22 DIAGNOSIS — I700 Atherosclerosis of aorta: Secondary | ICD-10-CM | POA: Insufficient documentation

## 2018-01-22 DIAGNOSIS — C3411 Malignant neoplasm of upper lobe, right bronchus or lung: Secondary | ICD-10-CM | POA: Diagnosis not present

## 2018-01-22 DIAGNOSIS — C3490 Malignant neoplasm of unspecified part of unspecified bronchus or lung: Secondary | ICD-10-CM | POA: Diagnosis not present

## 2018-01-22 DIAGNOSIS — Y842 Radiological procedure and radiotherapy as the cause of abnormal reaction of the patient, or of later complication, without mention of misadventure at the time of the procedure: Secondary | ICD-10-CM | POA: Diagnosis not present

## 2018-01-24 ENCOUNTER — Telehealth: Payer: Self-pay | Admitting: Family Medicine

## 2018-01-24 ENCOUNTER — Telehealth: Payer: Self-pay | Admitting: Oncology

## 2018-01-24 NOTE — Telephone Encounter (Signed)
Copied from [place]. Topic: Quick Communication - See Telephone Encounter >> Jan 24, 2018  1:13 PM Arletha Grippe wrote: CRM for notification. See Telephone encounter for:   01/24/18. Geni Bers [facility] called requesting verbal orders  1 week 1 for community resources Cb [phone] Confidential voice mail if no answer

## 2018-01-24 NOTE — Telephone Encounter (Signed)
See note

## 2018-01-24 NOTE — Telephone Encounter (Signed)
Notified patient's daughter, Cristopher Estimable, of good CT scan results per Dr. Sondra Come.  She verbalized understanding and will notify her father.

## 2018-01-25 NOTE — Telephone Encounter (Signed)
Called and provided verbal orders as requested

## 2018-01-25 NOTE — Telephone Encounter (Signed)
Copied from [place]. Topic: Quick Communication - See Telephone Encounter >> Jan 24, 2018  1:13 PM Arletha Grippe wrote: CRM for notification. See Telephone encounter for:   01/24/18. Geni Bers [facility] called requesting verbal orders  1 week 1 for community resources Cb [phone] Confidential voice mail if no answer   >> Jan 25, 2018  3:41 PM Burnis Medin, NT wrote: Geni Bers is calling back because she haven't heard back from the doctor for verbal orders for the social worker to return to patients home to help with community resources. Pls call back for orders at [phone]

## 2018-01-26 DIAGNOSIS — I700 Atherosclerosis of aorta: Secondary | ICD-10-CM | POA: Diagnosis not present

## 2018-01-26 DIAGNOSIS — I129 Hypertensive chronic kidney disease with stage 1 through stage 4 chronic kidney disease, or unspecified chronic kidney disease: Secondary | ICD-10-CM | POA: Diagnosis not present

## 2018-01-26 DIAGNOSIS — M109 Gout, unspecified: Secondary | ICD-10-CM | POA: Diagnosis not present

## 2018-01-26 DIAGNOSIS — N183 Chronic kidney disease, stage 3 (moderate): Secondary | ICD-10-CM | POA: Diagnosis not present

## 2018-01-26 DIAGNOSIS — C3411 Malignant neoplasm of upper lobe, right bronchus or lung: Secondary | ICD-10-CM | POA: Diagnosis not present

## 2018-01-26 DIAGNOSIS — J439 Emphysema, unspecified: Secondary | ICD-10-CM | POA: Diagnosis not present

## 2018-01-31 DIAGNOSIS — J439 Emphysema, unspecified: Secondary | ICD-10-CM | POA: Diagnosis not present

## 2018-01-31 DIAGNOSIS — I700 Atherosclerosis of aorta: Secondary | ICD-10-CM | POA: Diagnosis not present

## 2018-01-31 DIAGNOSIS — I129 Hypertensive chronic kidney disease with stage 1 through stage 4 chronic kidney disease, or unspecified chronic kidney disease: Secondary | ICD-10-CM | POA: Diagnosis not present

## 2018-01-31 DIAGNOSIS — N183 Chronic kidney disease, stage 3 (moderate): Secondary | ICD-10-CM | POA: Diagnosis not present

## 2018-01-31 DIAGNOSIS — C3411 Malignant neoplasm of upper lobe, right bronchus or lung: Secondary | ICD-10-CM | POA: Diagnosis not present

## 2018-01-31 DIAGNOSIS — M109 Gout, unspecified: Secondary | ICD-10-CM | POA: Diagnosis not present

## 2018-01-31 NOTE — Telephone Encounter (Signed)
Also notified [name] and [place] of his CT Scan results.

## 2018-02-02 ENCOUNTER — Telehealth: Payer: Self-pay | Admitting: Family Medicine

## 2018-02-02 DIAGNOSIS — I700 Atherosclerosis of aorta: Secondary | ICD-10-CM | POA: Diagnosis not present

## 2018-02-02 DIAGNOSIS — N183 Chronic kidney disease, stage 3 (moderate): Secondary | ICD-10-CM | POA: Diagnosis not present

## 2018-02-02 DIAGNOSIS — C3411 Malignant neoplasm of upper lobe, right bronchus or lung: Secondary | ICD-10-CM | POA: Diagnosis not present

## 2018-02-02 DIAGNOSIS — J439 Emphysema, unspecified: Secondary | ICD-10-CM | POA: Diagnosis not present

## 2018-02-02 DIAGNOSIS — I129 Hypertensive chronic kidney disease with stage 1 through stage 4 chronic kidney disease, or unspecified chronic kidney disease: Secondary | ICD-10-CM | POA: Diagnosis not present

## 2018-02-02 DIAGNOSIS — M109 Gout, unspecified: Secondary | ICD-10-CM | POA: Diagnosis not present

## 2018-02-02 NOTE — Telephone Encounter (Signed)
Please be advised.   Copied from [place]. Topic: Inquiry >> Feb 02, 2018 10:08 AM Pricilla Handler wrote: Reason for CRM: Patient's daughter has a form that needs to have corrections made and completed for her father to ride the SCAT bus. [name] stated that she will come in the office and bring the paperwork by later today. [name] wants Dr. Yong Channel to complete the document today as they live far from the office.       Thank You!!!

## 2018-02-05 NOTE — Telephone Encounter (Signed)
noted 

## 2018-02-08 DIAGNOSIS — I700 Atherosclerosis of aorta: Secondary | ICD-10-CM | POA: Diagnosis not present

## 2018-02-08 DIAGNOSIS — J439 Emphysema, unspecified: Secondary | ICD-10-CM | POA: Diagnosis not present

## 2018-02-08 DIAGNOSIS — M109 Gout, unspecified: Secondary | ICD-10-CM | POA: Diagnosis not present

## 2018-02-08 DIAGNOSIS — N183 Chronic kidney disease, stage 3 (moderate): Secondary | ICD-10-CM | POA: Diagnosis not present

## 2018-02-08 DIAGNOSIS — I129 Hypertensive chronic kidney disease with stage 1 through stage 4 chronic kidney disease, or unspecified chronic kidney disease: Secondary | ICD-10-CM | POA: Diagnosis not present

## 2018-02-08 DIAGNOSIS — C3411 Malignant neoplasm of upper lobe, right bronchus or lung: Secondary | ICD-10-CM | POA: Diagnosis not present

## 2018-02-12 NOTE — Telephone Encounter (Signed)
This was completed in office and given to daughter

## 2018-02-12 NOTE — Telephone Encounter (Signed)
Patient's daughter calling to inform Dr. Lamont Snowball that she is dropping off ppw for SCAT for her father. Only question number 2 and 5 needs to be answered by Yong Channel explaining why patient needs to take special transportation. I advised her she can drop it off and turnaround time is 5 to 7 days. She says it's due today and wanted to know if that would be possible to complete it today. He has dementia.

## 2018-02-16 ENCOUNTER — Telehealth: Payer: Self-pay | Admitting: Family Medicine

## 2018-02-16 DIAGNOSIS — I700 Atherosclerosis of aorta: Secondary | ICD-10-CM | POA: Diagnosis not present

## 2018-02-16 DIAGNOSIS — I129 Hypertensive chronic kidney disease with stage 1 through stage 4 chronic kidney disease, or unspecified chronic kidney disease: Secondary | ICD-10-CM | POA: Diagnosis not present

## 2018-02-16 DIAGNOSIS — J439 Emphysema, unspecified: Secondary | ICD-10-CM | POA: Diagnosis not present

## 2018-02-16 DIAGNOSIS — N183 Chronic kidney disease, stage 3 (moderate): Secondary | ICD-10-CM | POA: Diagnosis not present

## 2018-02-16 DIAGNOSIS — M109 Gout, unspecified: Secondary | ICD-10-CM | POA: Diagnosis not present

## 2018-02-16 DIAGNOSIS — C3411 Malignant neoplasm of upper lobe, right bronchus or lung: Secondary | ICD-10-CM | POA: Diagnosis not present

## 2018-02-16 NOTE — Telephone Encounter (Signed)
Pt last seen by you on 01/05/18 and has f/u on 05/17/18. Ok to give orders?

## 2018-02-16 NOTE — Telephone Encounter (Signed)
Copied from [place] [phone]. Topic: Quick Communication - See Telephone Encounter >> Feb 16, 2018  2:07 PM Bea Graff, NT wrote: CRM for notification. See Telephone encounter for: Jinny Blossom from [facility] states that pts daughter is refusing to discharge pt from their care and Jinny Blossom has come up with some plans for him. They want to follow up with him for 4 more weeks to check weight. Pt is having a hard time eating like he should. Also want to start OT for just a assessment and evaluate him. Jinny Blossom would like to see if these orders are ok with Dr. Yong Channel and if he would like to add anything. CB#: [phone].  02/16/18.

## 2018-02-16 NOTE — Telephone Encounter (Signed)
im ok with this as long as insurance covers this without another visit. Please pass that onto [facility] to have them confirm insurance coverage.

## 2018-02-16 NOTE — Telephone Encounter (Signed)
Called and spoke with Jinny Blossom who states they can continue for a couple of more weeks and insurance will cover. They are trying to talk to the family about Mr. Bonsell possibly moving to [place] with the other daughter.

## 2018-02-22 DIAGNOSIS — I129 Hypertensive chronic kidney disease with stage 1 through stage 4 chronic kidney disease, or unspecified chronic kidney disease: Secondary | ICD-10-CM | POA: Diagnosis not present

## 2018-02-22 DIAGNOSIS — C3411 Malignant neoplasm of upper lobe, right bronchus or lung: Secondary | ICD-10-CM | POA: Diagnosis not present

## 2018-02-22 DIAGNOSIS — I700 Atherosclerosis of aorta: Secondary | ICD-10-CM | POA: Diagnosis not present

## 2018-02-22 DIAGNOSIS — J439 Emphysema, unspecified: Secondary | ICD-10-CM | POA: Diagnosis not present

## 2018-02-22 DIAGNOSIS — N183 Chronic kidney disease, stage 3 (moderate): Secondary | ICD-10-CM | POA: Diagnosis not present

## 2018-02-22 DIAGNOSIS — M109 Gout, unspecified: Secondary | ICD-10-CM | POA: Diagnosis not present

## 2018-02-23 DIAGNOSIS — C3411 Malignant neoplasm of upper lobe, right bronchus or lung: Secondary | ICD-10-CM | POA: Diagnosis not present

## 2018-02-23 DIAGNOSIS — J439 Emphysema, unspecified: Secondary | ICD-10-CM | POA: Diagnosis not present

## 2018-02-23 DIAGNOSIS — I129 Hypertensive chronic kidney disease with stage 1 through stage 4 chronic kidney disease, or unspecified chronic kidney disease: Secondary | ICD-10-CM | POA: Diagnosis not present

## 2018-02-23 DIAGNOSIS — M109 Gout, unspecified: Secondary | ICD-10-CM | POA: Diagnosis not present

## 2018-02-23 DIAGNOSIS — N183 Chronic kidney disease, stage 3 (moderate): Secondary | ICD-10-CM | POA: Diagnosis not present

## 2018-02-23 DIAGNOSIS — I700 Atherosclerosis of aorta: Secondary | ICD-10-CM | POA: Diagnosis not present

## 2018-02-26 ENCOUNTER — Telehealth: Payer: Self-pay | Admitting: Family Medicine

## 2018-02-26 NOTE — Telephone Encounter (Signed)
Copied from [place] [phone]. Topic: General - Other >> Feb 26, 2018  8:53 AM Valla Leaver wrote: Reason for CRM: Sharyn Lull, OT w/, [facility] calling to let Dr. Yong Channel know the patient's OT eval was completed, he was deemed independent and no further therapy is needed at this time.

## 2018-02-27 NOTE — Telephone Encounter (Signed)
thanks

## 2018-03-02 DIAGNOSIS — C3411 Malignant neoplasm of upper lobe, right bronchus or lung: Secondary | ICD-10-CM | POA: Diagnosis not present

## 2018-03-02 DIAGNOSIS — I129 Hypertensive chronic kidney disease with stage 1 through stage 4 chronic kidney disease, or unspecified chronic kidney disease: Secondary | ICD-10-CM | POA: Diagnosis not present

## 2018-03-02 DIAGNOSIS — I700 Atherosclerosis of aorta: Secondary | ICD-10-CM | POA: Diagnosis not present

## 2018-03-02 DIAGNOSIS — N183 Chronic kidney disease, stage 3 (moderate): Secondary | ICD-10-CM | POA: Diagnosis not present

## 2018-03-02 DIAGNOSIS — J439 Emphysema, unspecified: Secondary | ICD-10-CM | POA: Diagnosis not present

## 2018-03-02 DIAGNOSIS — M109 Gout, unspecified: Secondary | ICD-10-CM | POA: Diagnosis not present

## 2018-03-07 ENCOUNTER — Telehealth: Payer: Self-pay | Admitting: Family Medicine

## 2018-03-07 NOTE — Telephone Encounter (Signed)
See note.  Copied from [place] [phone]. Topic: Inquiry >> Mar 07, 2018 12:23 PM Corie Chiquito, [place] wrote: Reason for CRM: Patients daughter is calling because her father saw some medication for memory lost on tv (doesn't know name) of and wanted to know if he could take that medication. If someone could give her a call back about this at

## 2018-03-07 NOTE — Telephone Encounter (Signed)
Called and spoke with daughter [name] (who is on the DPR) and let her know her father is already on Aricept. She thinks her dad seen a commercial on tv for some memory supplement and she will let him know it is not ok to start a new medication without the advisement from a professional medical doctor as this may interact with some of the medications he is already on

## 2018-03-09 DIAGNOSIS — C3411 Malignant neoplasm of upper lobe, right bronchus or lung: Secondary | ICD-10-CM | POA: Diagnosis not present

## 2018-03-09 DIAGNOSIS — I129 Hypertensive chronic kidney disease with stage 1 through stage 4 chronic kidney disease, or unspecified chronic kidney disease: Secondary | ICD-10-CM | POA: Diagnosis not present

## 2018-03-09 DIAGNOSIS — I700 Atherosclerosis of aorta: Secondary | ICD-10-CM | POA: Diagnosis not present

## 2018-03-09 DIAGNOSIS — J439 Emphysema, unspecified: Secondary | ICD-10-CM | POA: Diagnosis not present

## 2018-03-09 DIAGNOSIS — N183 Chronic kidney disease, stage 3 (moderate): Secondary | ICD-10-CM | POA: Diagnosis not present

## 2018-03-09 DIAGNOSIS — M109 Gout, unspecified: Secondary | ICD-10-CM | POA: Diagnosis not present

## 2018-03-16 IMAGING — CT CT NECK W/ CM
1 of 8 series · 1 of 33 positions shown · IV contrast (Iodine)
Comparison: Concurrent CT of the chest.  CT neck dated 02/07/2017.

CLINICAL DATA: 80 y/o M; neck swelling with difficulty swallowing
and breathing.

EXAM:
CT NECK WITH CONTRAST
TECHNIQUE: Multidetector CT imaging of the neck was performed using the
standard protocol following the bolus administration of intravenous
contrast.
CONTRAST:  100mL 9DTKTD-5TT IOPAMIDOL (9DTKTD-5TT) INJECTION 61%

[Series 308: coronals 2 · coronal · 0.49mm/px · 1 of 72 slices shown]
[im 1/72  bone]
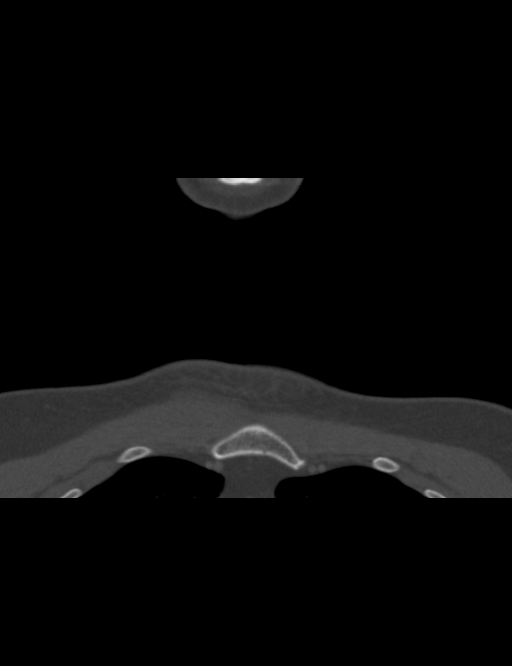

[1 of 33 positions shown; findings below may reference images not displayed]

FINDINGS: Pharynx and larynx: Interval development of severe swelling of the
right sided of oropharyngeal mucosa extending into right lateral and
posterior walls of the hypopharynx and larynx with essentially
complete effacement of the glottis. Additionally there is a small
retropharyngeal fluid collection which extends from the C1 -C6
levels.

There is diffuse edema in the right face subcutaneous fat, bilateral
submandibular spaces, right parotid space, and right-sided
parapharyngeal fat. Subcutaneous edema extends into the anterior
neck and right supraclavicular fossa.

Salivary glands: Persistent swelling and enhancement of the right
parotid gland with additional swelling of right submandibular
greater than left submandibular glands progressed from prior study.

Thyroid: Subcentimeter nodules.

Lymph nodes: Reactive upper cervical prominent lymph nodes.

Vascular: Atherosclerosis of bilateral carotid bifurcations with
mild stenosis on the left.

Limited intracranial: Negative.

Visualized orbits: Negative.

Mastoids and visualized paranasal sinuses: Clear.

Skeleton: Cervical spondylosis with predominantly discogenic
degenerative changes. No high-grade bony canal stenosis.

Upper chest: 12 mm spiculated nodule in the right upper lobe of the
lung. Mild emphysema.

Other: None.
IMPRESSION: 1. Interval development of severe swelling of right-sided
oropharyngeal mucosa extending into the right lateral and posterior
walls of the hypopharynx and larynx with essentially complete
effacement of the glottic airway. Findings are compatible with
severe acute pharyngitis.
2. Small retropharyngeal fluid collection.
3. Diffuse edema in subcutaneous fat of right face and anterior neck
extending into submandibular spaces, right parotid space, and right
parapharyngeal fat.
4. Persistent swelling of the right parotid gland and additional
swelling of right greater than left submandibular glands which may
be reactive or represent acute sialadenitis.
5. No discrete peripherally enhancing organized abscess identified.
6. Please refer to concurrent CT of the chest for evaluation of
lungs and mediastinum.
These results were called by telephone at the time of interpretation
on 02/08/2017 at [DATE] to Dr. ANDIE NUNN , who verbally
acknowledged these results.

By: Kolby Fenelus M.D.

## 2018-04-05 ENCOUNTER — Ambulatory Visit (INDEPENDENT_AMBULATORY_CARE_PROVIDER_SITE_OTHER): Payer: Medicare Other | Admitting: Family Medicine

## 2018-04-05 ENCOUNTER — Encounter: Payer: Self-pay | Admitting: Family Medicine

## 2018-04-05 VITALS — BP 138/70 | HR 63 | Temp 98.4°F | Ht 63.0 in | Wt 193.2 lb

## 2018-04-05 DIAGNOSIS — E7849 Other hyperlipidemia: Secondary | ICD-10-CM | POA: Diagnosis not present

## 2018-04-05 DIAGNOSIS — I10 Essential (primary) hypertension: Secondary | ICD-10-CM

## 2018-04-05 DIAGNOSIS — F03B0 Unspecified dementia, moderate, without behavioral disturbance, psychotic disturbance, mood disturbance, and anxiety: Secondary | ICD-10-CM

## 2018-04-05 DIAGNOSIS — F0390 Unspecified dementia without behavioral disturbance: Secondary | ICD-10-CM

## 2018-04-05 NOTE — Patient Instructions (Addendum)
Health Maintenance Due  Topic Date Due  . TETANUS/TDAP- we will call to see if you got this at your pharmacy 12/26/2016   Looks like drainage in the throat causing that tickle from time to time. If this starts to bother you more, I can send something in for you (nasal spray). If you have trouble swallowing, fever, chills see [place] back immediately  No changes in medication today

## 2018-04-05 NOTE — Progress Notes (Signed)
Subjective:  Jerry [name] is a 82 y.o. year old very pleasant male patient who presents for/with See problem oriented charting ROS- No chest pain or shortness of breath. No headache or blurry vision.  Still with memory issues   Past Medical History-  Patient Active Problem List   Diagnosis Date Noted  . Cancer of upper lobe of right lung ([place]) 09/18/2017    Priority: High  . Moderate dementia without behavioral disturbance 07/17/2015    Priority: High  . Hyperlipidemia 10/15/2014    Priority: Medium  . Gout of big toe 08/04/2008    Priority: Medium  . Essential hypertension 07/25/2007    Priority: Medium  . PROSTATE CANCER, HX OF 07/25/2007    Priority: Medium  . Aortic atherosclerosis ([place]) 01/06/2018    Priority: Low  . Pulmonary emphysema ([place])     Priority: Low  . Allergic rhinitis 10/15/2014    Priority: Low  . Overactive bladder 10/15/2014    Priority: Low  . CARDIAC MURMUR 01/10/2011    Priority: Low  . ORGANIC IMPOTENCE 08/12/2010    Priority: Low  . Exertional dyspnea 03/15/2017  . Parotiditis 02/08/2017  . Facial swelling 02/08/2017  . Lung nodule 02/08/2017  . Submandibular space infection     Medications- reviewed and updated Current Outpatient Medications  Medication Sig Dispense Refill  . amLODipine-olmesartan (AZOR) 5-40 MG tablet TAKE 1 TABLET BY MOUTH DAILY 30 tablet 5  . atorvastatin (LIPITOR) 40 MG tablet TAKE 1 TABLET(40 MG) BY MOUTH DAILY 30 tablet 5  . donepezil (ARICEPT) 5 MG tablet Take once a day 30 tablet 5   No current facility-administered medications for this visit.     Objective: BP 138/70 (BP Location: Left Arm, Patient Position: Sitting, Cuff Size: Normal)   Pulse 63   Temp 98.4 F (36.9 C) (Oral)   Ht 5\' 3"  (1.6 m)   Wt 193 lb 3.2 oz (87.6 kg)   SpO2 98%   BMI 34.22 kg/m  Gen: NAD, resting comfortably CV: RRR. Systolic murmur LUSB stable Lungs: CTAB no crackles, wheeze, rhonchi Abdomen:  soft/nontender/nondistended/normal bowel sounds.  Ext: no edema Skin: warm, dry Neuro: normal gait, follows most of conversation  Assessment/Plan:  Hypertension S: controlled on amlodipien olmesartan 5-40mg  BP Readings from Last 3 Encounters:  04/05/18 138/70  01/11/18 (!) 150/90  01/05/18 136/62  A/P: We discussed blood pressure goal of <140/90. Continue current meds  Hyperlipidemia S: well controlled on atorvastatin 40mg  with last LDL under 70 A/P: continue current rx  Moderate dementia without behavioral disturbance S: we had worked to get home health set up but patient has refused this service. Daughter comes out to help about every other day. Apparently he doesn't have medicaid and his insurance doesn't cover services well.   Insurance pays for silver sneakers and [facility].   He is doing well on the aricept. Seems to be maintaining at least for now A/P: will continue to monitor. dont feel we can increase aricept- with BP running high normal or high for most part. Will continue to monitor and see if we can increase meds at a later time   Future Appointments  Date Time Provider [place]  04/12/2018 11:30 AM Gery Pray, MD [facility] None   Return in about 4 months (around 08/05/2018) for follow up- or sooner if needed.  Return precautions advised.  Garret Reddish, MD

## 2018-04-05 NOTE — Assessment & Plan Note (Addendum)
S: we had worked to get home health set up but patient has refused this service. Daughter comes out to help about every other day. Apparently he doesn't have medicaid and his insurance doesn't cover services well.   Insurance pays for silver sneakers and [facility].   He is doing well on the aricept. Seems to be maintaining at least for now A/P: will continue to monitor. dont feel we can increase aricept- with BP running high normal or high for most part. Will continue to monitor and see if we can increase meds at a later time

## 2018-04-12 ENCOUNTER — Other Ambulatory Visit: Payer: Self-pay

## 2018-04-12 ENCOUNTER — Ambulatory Visit
Admission: RE | Admit: 2018-04-12 | Discharge: 2018-04-12 | Disposition: A | Discharge status: Home / Self Care | Payer: Medicare Other | Source: Ambulatory Visit | Attending: Radiation Oncology | Admitting: Radiation Oncology

## 2018-04-12 ENCOUNTER — Encounter: Payer: Self-pay | Admitting: Radiation Oncology

## 2018-04-12 VITALS — BP 145/55 | HR 53 | Temp 97.6°F | Resp 18 | Wt 195.4 lb

## 2018-04-12 DIAGNOSIS — C3411 Malignant neoplasm of upper lobe, right bronchus or lung: Secondary | ICD-10-CM | POA: Insufficient documentation

## 2018-04-12 DIAGNOSIS — Z79899 Other long term (current) drug therapy: Secondary | ICD-10-CM | POA: Diagnosis not present

## 2018-04-12 DIAGNOSIS — I700 Atherosclerosis of aorta: Secondary | ICD-10-CM | POA: Insufficient documentation

## 2018-04-12 DIAGNOSIS — Z923 Personal history of irradiation: Secondary | ICD-10-CM | POA: Insufficient documentation

## 2018-04-12 DIAGNOSIS — Z08 Encounter for follow-up examination after completed treatment for malignant neoplasm: Secondary | ICD-10-CM | POA: Diagnosis not present

## 2018-04-12 DIAGNOSIS — Z85118 Personal history of other malignant neoplasm of bronchus and lung: Secondary | ICD-10-CM | POA: Diagnosis not present

## 2018-04-12 NOTE — Progress Notes (Signed)
  Radiation Oncology         [phone] ________________________________  Name: Jerry [name] MRN: [id number]  Date: 04/12/2018  DOB: [birth year]  Follow-Up Visit Note  CC: Marin Olp, MD  Marin Olp, MD    ICD-10-CM   1. Cancer of upper lobe of right lung (HCC) C34.11     Diagnosis:   Clinical Stage IA adenocarcinoma of the right upper lobe  Interval Since Last Radiation:  11 months  05/01/17 - 05/09/17 : Right Lung treated to 54 Gy in 3 fractions with SBRT  Narrative:  The patient returns today for routine follow-up of radiation completed on 05/09/17. He is accompanied by his daughter today. He is doing well overall.   Since his last visit to the office, he had a CT chest without contrast completed on 01/22/2018 with results showing: Evolution of linear nodular band in the RIGHT upper lobe is favored benign post radiation change. Recommend attention on follow-up as lesion has increased somewhat in thickness. No adenopathy. Aortic Atherosclerosis (ICD10-I70.0).  On review of systems, he reports a mild scratching sensation to his left anterior neck and post-nasal drainage. He denies CP, trouble swallowing, SOB, and any other symptoms.     ALLERGIES:  is allergic to namenda [memantine].  Meds: Current Outpatient Medications  Medication Sig Dispense Refill  . amLODipine-olmesartan (AZOR) 5-40 MG tablet TAKE 1 TABLET BY MOUTH DAILY 30 tablet 5  . atorvastatin (LIPITOR) 40 MG tablet TAKE 1 TABLET(40 MG) BY MOUTH DAILY 30 tablet 5  . donepezil (ARICEPT) 5 MG tablet Take once a day 30 tablet 5   No current facility-administered medications for this encounter.    REVIEW OF SYSTEMS: A 10+ POINT REVIEW OF SYSTEMS WAS OBTAINED including neurology, dermatology, psychiatry, cardiac, respiratory, lymph, extremities, GI, GU, musculoskeletal, constitutional, reproductive, HEENT. All pertinent positives are noted in the HPI. All others are negative.  Physical Findings: The  patient is in no acute distress. Patient is alert and oriented.  weight is 195 lb 6 oz (88.6 kg). His oral temperature is 97.6 F (36.4 C). His blood pressure is 145/55 (abnormal) and his pulse is 53 (abnormal). His respiration is 18 and oxygen saturation is 100%.  No significant changes. Lungs are clear to auscultation bilaterally. Heart has regular rate and rhythm. No palpable cervical, supraclavicular, or axillary adenopathy. Abdomen soft, non-tender, with normal bowel sounds. Palpation along the anterior neck, where the pt points to area of discomfort, shows no palpable mass or unusual findings. Oropharynx clear.     Lab Findings: Lab Results  Component Value Date   WBC 7.5 11/23/2017   HGB 13.1 11/23/2017   HCT 40.6 11/23/2017   MCV 91.9 11/23/2017   PLT 185.0 11/23/2017    Radiographic Findings: No results found.  Impression: clinically stable. No clinical evidence of recurrence on clinical exam.     Plan:  Will order a CT scan at this time. Routine follow up in 6 months with a repeat CT scan.        -----------------------------------  Blair Promise, PhD, MD  This document serves as a record of services personally performed by Gery Pray, MD. It was created on his behalf by [facility], a trained medical scribe. The creation of this record is based on the scribe's personal observations and the provider's statements to them. This document has been checked and approved by the attending provider.

## 2018-04-12 NOTE — Progress Notes (Signed)
Jerry [name] is here for a follow-up appointment. Patient denies any pain or fatigue. Denies any difficulty with swallowing. Denies any shortness of breath. States that his appetite is good. Denies any coughing. Denies any skin irriatation Vitals:   04/12/18 1120  BP: (!) 145/55  Pulse: (!) 53  Resp: 18  Temp: 97.6 F (36.4 C)  TempSrc: Oral  SpO2: 100%  Weight: 195 lb 6 oz (88.6 kg)   Wt Readings from Last 3 Encounters:  04/12/18 195 lb 6 oz (88.6 kg)  04/05/18 193 lb 3.2 oz (87.6 kg)  01/11/18 195 lb (88.5 kg)

## 2018-04-14 ENCOUNTER — Other Ambulatory Visit: Payer: Self-pay | Admitting: Family Medicine

## 2018-04-14 DIAGNOSIS — R413 Other amnesia: Secondary | ICD-10-CM

## 2018-04-18 ENCOUNTER — Ambulatory Visit (HOSPITAL_COMMUNITY)
Admission: RE | Admit: 2018-04-18 | Discharge: 2018-04-18 | Disposition: A | Discharge status: Home / Self Care | Payer: Medicare Other | Source: Ambulatory Visit | Attending: Radiation Oncology | Admitting: Radiation Oncology

## 2018-04-18 ENCOUNTER — Encounter (HOSPITAL_COMMUNITY): Payer: Self-pay

## 2018-04-18 DIAGNOSIS — R918 Other nonspecific abnormal finding of lung field: Secondary | ICD-10-CM | POA: Insufficient documentation

## 2018-04-18 DIAGNOSIS — I700 Atherosclerosis of aorta: Secondary | ICD-10-CM | POA: Insufficient documentation

## 2018-04-18 DIAGNOSIS — C3411 Malignant neoplasm of upper lobe, right bronchus or lung: Secondary | ICD-10-CM | POA: Diagnosis not present

## 2018-04-18 DIAGNOSIS — Z9889 Other specified postprocedural states: Secondary | ICD-10-CM | POA: Diagnosis not present

## 2018-04-18 DIAGNOSIS — I2510 Atherosclerotic heart disease of native coronary artery without angina pectoris: Secondary | ICD-10-CM | POA: Diagnosis not present

## 2018-04-19 ENCOUNTER — Telehealth: Payer: Self-pay

## 2018-04-19 NOTE — Telephone Encounter (Signed)
Called daughter (pt's POA) to inform her per Dr. Sondra Come of good CT results. Daughter verbalized understanding and stated she would update pt.

## 2018-04-24 ENCOUNTER — Telehealth: Payer: Self-pay | Admitting: *Deleted

## 2018-04-24 NOTE — Telephone Encounter (Signed)
Voicemail received requesting "Return call to patient's [name] calling after missed call.  Perhaps results of scans on April 18, 219.  Cell number [phone].  "Message forwarded to [facility] for further communication.

## 2018-05-01 ENCOUNTER — Encounter: Payer: Self-pay | Admitting: Family Medicine

## 2018-05-01 ENCOUNTER — Ambulatory Visit (INDEPENDENT_AMBULATORY_CARE_PROVIDER_SITE_OTHER): Payer: Medicare Other | Admitting: Family Medicine

## 2018-05-01 ENCOUNTER — Ambulatory Visit: Payer: Self-pay

## 2018-05-01 VITALS — BP 122/66 | HR 58 | Temp 98.7°F | Ht 63.0 in | Wt 193.8 lb

## 2018-05-01 DIAGNOSIS — S80862A Insect bite (nonvenomous), left lower leg, initial encounter: Secondary | ICD-10-CM | POA: Diagnosis not present

## 2018-05-01 DIAGNOSIS — T63481A Toxic effect of venom of other arthropod, accidental (unintentional), initial encounter: Secondary | ICD-10-CM | POA: Diagnosis not present

## 2018-05-01 DIAGNOSIS — W57XXXA Bitten or stung by nonvenomous insect and other nonvenomous arthropods, initial encounter: Secondary | ICD-10-CM

## 2018-05-01 NOTE — Patient Instructions (Signed)
Local reaction to insect bite (we are not sure which one bit you though)  Please avoid the bees!   Avoid scratching. Can use ice 15 minutes 3x a day.   Can use neosporin on it. No signs of infection- if it gets bigger, redder, or you see purulent material please let us know

## 2018-05-01 NOTE — Telephone Encounter (Signed)
Pt called to be triaged and pt had to end call to go to appt that was scheduled by his niece. NT not aware that appt had been made. NT call ended.

## 2018-05-01 NOTE — Progress Notes (Signed)
Subjective:  Jerry [name] is a 82 y.o. year old very pleasant male patient who presents for/with See problem oriented charting ROS-no fever or chills.  No extending redness around site.  Denies pain near site  Past Medical History-  Patient Active Problem List   Diagnosis Date Noted  . Cancer of upper lobe of right lung ([place]) 09/18/2017    Priority: High  . Moderate dementia without behavioral disturbance 07/17/2015    Priority: High  . Hyperlipidemia 10/15/2014    Priority: Medium  . Gout of big toe 08/04/2008    Priority: Medium  . Essential hypertension 07/25/2007    Priority: Medium  . PROSTATE CANCER, HX OF 07/25/2007    Priority: Medium  . Aortic atherosclerosis ([place]) 01/06/2018    Priority: Low  . Pulmonary emphysema ([place])     Priority: Low  . Allergic rhinitis 10/15/2014    Priority: Low  . Overactive bladder 10/15/2014    Priority: Low  . CARDIAC MURMUR 01/10/2011    Priority: Low  . ORGANIC IMPOTENCE 08/12/2010    Priority: Low  . Exertional dyspnea 03/15/2017  . Parotiditis 02/08/2017  . Facial swelling 02/08/2017  . Lung nodule 02/08/2017  . Submandibular space infection     Medications- reviewed and updated Current Outpatient Medications  Medication Sig Dispense Refill  . amLODipine-olmesartan (AZOR) 5-40 MG tablet TAKE 1 TABLET BY MOUTH DAILY 30 tablet 5  . atorvastatin (LIPITOR) 40 MG tablet TAKE 1 TABLET(40 MG) BY MOUTH DAILY 30 tablet 5  . donepezil (ARICEPT) 5 MG tablet TAKE 1 TABLET BY MOUTH ONCE A DAY 30 tablet 5   No current facility-administered medications for this visit.     Objective: BP 122/66 (BP Location: Left Arm, Patient Position: Sitting, Cuff Size: Large)   Pulse (!) 58   Temp 98.7 F (37.1 C) (Oral)   Ht 5\' 3"  (1.6 m)   Wt 193 lb 12.8 oz (87.9 kg)   SpO2 97%   BMI 34.33 kg/m  Gen: NAD, memory issues noted-patient repetitive about concerns CV: RRR no murmurs rubs or gallops Lungs: CTAB no crackles, wheeze,  rhonchi Abdomen: soft/nontender/nondistended/normal bowel sounds.  Ext: no pretibial edema.  On lower portion of left calf there is a quarter sized area of induration and mild erythema with central darker area.  No fluctuance noted Skin: warm, dry  Assessment/Plan:  Insect bite of left lower extremity, initial encounter  Local reaction to insect sting, accidental or unintentional, initial encounter s: Patient has been out trying to kill some insects in his backyard.  Specifically there are some bees that he has been trying to SWAT with his hat and then stomp on.  In the last week he noted an itchy area on the back of his left calf.  He scratched it some and it seemed to get somewhat enlarged to about the size of a quarter.  In the last 24 hours the area has started to improve.  It is less itchy. A/P: From AVS: "Local reaction to insect bite (we are not sure which one bit you though)  Please avoid the bees!   Avoid scratching. Can use ice 15 minutes 3x a day.   Can use neosporin on it. No signs of infection- if it gets bigger, redder, or you see purulent material please let us know"  Of note-patient has dementia and required extended counseling about this topic.  Also note-after visit noted patient was out of date for tetanus shot-he needs to come back for Td under  insect bite of left lower extremity initial encounter-we will need to have patient back for this   Future Appointments  Date Time Provider [place]  08/07/2018 11:15 AM Marin Olp, MD [facility]  10/18/2018 11:00 AM Gery Pray, MD [facility] None   Time Stamp The duration of face-to-face time during this visit was greater than 15 minutes. Greater than 50% of this time was spent in counseling, explanation of diagnosis, planning of further management, and/or coordination of care including counseling and repeat counseling to patient about his concerns about insect bite on leg and bees in his yard.    Return  precautions advised.  Garret Reddish, MD

## 2018-05-03 ENCOUNTER — Ambulatory Visit (INDEPENDENT_AMBULATORY_CARE_PROVIDER_SITE_OTHER): Payer: Medicare Other

## 2018-05-03 ENCOUNTER — Encounter: Payer: Self-pay | Admitting: Family Medicine

## 2018-05-03 DIAGNOSIS — L089 Local infection of the skin and subcutaneous tissue, unspecified: Secondary | ICD-10-CM

## 2018-05-03 DIAGNOSIS — W57XXXA Bitten or stung by nonvenomous insect and other nonvenomous arthropods, initial encounter: Secondary | ICD-10-CM | POA: Diagnosis not present

## 2018-05-03 DIAGNOSIS — Z23 Encounter for immunization: Secondary | ICD-10-CM

## 2018-05-03 DIAGNOSIS — S80861A Insect bite (nonvenomous), right lower leg, initial encounter: Secondary | ICD-10-CM

## 2018-05-03 NOTE — Progress Notes (Signed)
Per last office note patient was called in to have TD. Immunization was placed in left delt with no problems. Did have patient fill out ABN and informed any amount due after insurance will be billed.

## 2018-05-08 DIAGNOSIS — H401132 Primary open-angle glaucoma, bilateral, moderate stage: Secondary | ICD-10-CM | POA: Diagnosis not present

## 2018-05-17 ENCOUNTER — Ambulatory Visit: Payer: Medicare Other | Admitting: Family Medicine

## 2018-06-03 ENCOUNTER — Other Ambulatory Visit: Payer: Self-pay | Admitting: Family Medicine

## 2018-06-03 DIAGNOSIS — E785 Hyperlipidemia, unspecified: Secondary | ICD-10-CM

## 2018-06-20 ENCOUNTER — Other Ambulatory Visit: Payer: Self-pay

## 2018-06-20 NOTE — Patient Outreach (Signed)
[place] [facility]) Care Management  06/20/2018  DODD SCHMID 08/14/1936 [id number]   Medication Adherence call to Mr. Arlon Bleier spoke with patient he said his daughter takes care of his medication he does not know if he is taking Atorvastatin he gave his daughter's telephone number but she did not answer and you can not leave messages.Mr. Guitron is showing past due under [facility] Ins.on Atorvastatin 40  & Amlodipine/Olmesartan 5/40.   [place] Management Direct Dial [phone]  Fax [phone] [email or web address]

## 2018-07-09 ENCOUNTER — Other Ambulatory Visit: Payer: Self-pay

## 2018-07-09 NOTE — Patient Outreach (Signed)
[place] [facility]) Care Management  07/09/2018  Jerry [name] 10/26/1936 [id number]   Medication Adherence call to Mr. Ishmael Berkovich spoke with patient he said he still has medication his daughter set his pill box every week he did not know what medications he is taking his daughter takes care of all of his medications and picks then up from the pharmacy. Mr. Veney is showing past due under [facility] Ins.on Atorvastatin 40 mg and Amlodipine/Olmesartan 5/40 mg.   [place] Management Direct Dial [phone]  Fax [phone] [email or web address]

## 2018-07-18 ENCOUNTER — Other Ambulatory Visit: Payer: Self-pay | Admitting: Family Medicine

## 2018-08-07 ENCOUNTER — Encounter: Payer: Self-pay | Admitting: Family Medicine

## 2018-08-07 ENCOUNTER — Ambulatory Visit (INDEPENDENT_AMBULATORY_CARE_PROVIDER_SITE_OTHER): Payer: Medicare Other | Admitting: Family Medicine

## 2018-08-07 DIAGNOSIS — F0390 Unspecified dementia without behavioral disturbance: Secondary | ICD-10-CM | POA: Diagnosis not present

## 2018-08-07 DIAGNOSIS — I10 Essential (primary) hypertension: Secondary | ICD-10-CM

## 2018-08-07 DIAGNOSIS — E7849 Other hyperlipidemia: Secondary | ICD-10-CM | POA: Diagnosis not present

## 2018-08-07 DIAGNOSIS — F03B0 Unspecified dementia, moderate, without behavioral disturbance, psychotic disturbance, mood disturbance, and anxiety: Secondary | ICD-10-CM

## 2018-08-07 LAB — LIPID PANEL
CHOLESTEROL: 125 mg/dL (ref 0–200)
HDL: 33.2 mg/dL — ABNORMAL LOW (ref >39.00)
LDL Cholesterol: 79 mg/dL (ref 0–99)
NonHDL: 91.81
TRIGLYCERIDES: 64 mg/dL (ref 0.0–149.0)
Total CHOL/HDL Ratio: 4
VLDL: 12.8 mg/dL (ref 0.0–40.0)

## 2018-08-07 LAB — COMPREHENSIVE METABOLIC PANEL
ALBUMIN: 4 g/dL (ref 3.5–5.2)
ALK PHOS: 70 U/L (ref 39–117)
ALT: 18 U/L (ref 0–53)
AST: 16 U/L (ref 0–37)
BUN: 16 mg/dL (ref 6–23)
CALCIUM: 9.2 mg/dL (ref 8.4–10.5)
CHLORIDE: 114 meq/L — AB (ref 96–112)
CO2: 26 mEq/L (ref 19–32)
CREATININE: 1.44 mg/dL (ref 0.40–1.50)
GFR: 60.69 mL/min (ref >60.00)
Glucose, Bld: 88 mg/dL (ref 70–99)
POTASSIUM: 4.3 meq/L (ref 3.5–5.1)
Sodium: 144 mEq/L (ref 135–145)
Total Bilirubin: 0.5 mg/dL (ref 0.2–1.2)
Total Protein: 6.7 g/dL (ref 6.0–8.3)

## 2018-08-07 LAB — CBC
HEMATOCRIT: 37.9 % — AB (ref 39.0–52.0)
HEMOGLOBIN: 12.6 g/dL — AB (ref 13.0–17.0)
MCHC: 33.3 g/dL (ref 30.0–36.0)
MCV: 90.3 fl (ref 78.0–100.0)
PLATELETS: 165 10*3/uL (ref 150.0–400.0)
RBC: 4.19 Mil/uL — AB (ref 4.22–5.81)
RDW: 14.8 % (ref 11.5–15.5)
WBC: 6.5 10*3/uL (ref 4.0–10.5)

## 2018-08-07 NOTE — Assessment & Plan Note (Signed)
S:  doing reasonably well on aricept 5mg . Did not tolerate namenda- very dizzy though MMSe was 17 on last check so would prefer to have him on A/P: likely mmse at next visit for update. If BP controlled likely increase aricept to 10mg - though daughter is hesitant as wants to minimize meds

## 2018-08-07 NOTE — Patient Instructions (Signed)
Please stop by lab before you go  No changes today. Make sure to take blood pressure medicine as soon as you get up- see me in 3 months for recheck to make sure blood pressure is better- make SURE to take the medicine on the day you come in so we can see if its working

## 2018-08-07 NOTE — Progress Notes (Signed)
Subjective:  Jerry [name] is a 82 y.o. year old very pleasant male patient who presents for/with See problem oriented charting ROS- no chest pain. No edema.  No abnormal fatigue. Does have memory issues.   Past Medical History-  Patient Active Problem List   Diagnosis Date Noted  . Cancer of upper lobe of right lung ([place]) 09/18/2017    Priority: High  . Moderate dementia without behavioral disturbance 07/17/2015    Priority: High  . Hyperlipidemia 10/15/2014    Priority: Medium  . Gout of big toe 08/04/2008    Priority: Medium  . Essential hypertension 07/25/2007    Priority: Medium  . PROSTATE CANCER, HX OF 07/25/2007    Priority: Medium  . Aortic atherosclerosis ([place]) 01/06/2018    Priority: Low  . Pulmonary emphysema ([place])     Priority: Low  . Allergic rhinitis 10/15/2014    Priority: Low  . Overactive bladder 10/15/2014    Priority: Low  . CARDIAC MURMUR 01/10/2011    Priority: Low  . ORGANIC IMPOTENCE 08/12/2010    Priority: Low  . Exertional dyspnea 03/15/2017  . Parotiditis 02/08/2017  . Facial swelling 02/08/2017  . Lung nodule 02/08/2017  . Submandibular space infection     Medications- reviewed and updated Current Outpatient Medications  Medication Sig Dispense Refill  . amLODipine-olmesartan (AZOR) 5-40 MG tablet TAKE 1 TABLET BY MOUTH DAILY 30 tablet 5  . atorvastatin (LIPITOR) 40 MG tablet TAKE 1 TABLET(40 MG) BY MOUTH DAILY 30 tablet 5  . donepezil (ARICEPT) 5 MG tablet TAKE 1 TABLET BY MOUTH ONCE A DAY 30 tablet 5   No current facility-administered medications for this visit.     Objective: BP (!) 150/72 (BP Location: Left Arm, Patient Position: Sitting, Cuff Size: Normal)   Pulse (!) 58   Temp 98.5 F (36.9 C) (Oral)   Ht 5\' 3"  (1.6 m)   Wt 192 lb (87.1 kg)   SpO2 97%   BMI 34.01 kg/m  Gen: NAD, resting comfortably CV: RRR no murmurs rubs or gallops Lungs: CTAB no crackles, wheeze, rhonchi Abdomen: soft/nontender/nondistended/normal  bowel sounds.  Ext: no edema Skin: warm, dry Neuro: some forgetfullness  Assessment/Plan:  Essential hypertension S: controlled poorly on no rx today. Should be on Amlodipine-olmesartan 5-40mg  but didn't take rx this morning (forgot) BP Readings from Last 3 Encounters:  08/07/18 (!) 150/72  05/01/18 122/66  04/12/18 (!) 145/55  A/P: We discussed blood pressure goal of <140/90. Continue current meds:  We counseled on consistently taking medicine each AM to help him not forget. Daughter will reinforce at home  Hyperlipidemia S:  controlled on  atorvastatin 40mg  with last LDL under 100. Getting to [facility] some in the past- knows he needs to restart and daughter is supportive Lab Results  Component Value Date   CHOL 203 (H) 08/01/2016   HDL 33.40 (L) 08/01/2016   LDLCALC 150 (H) 08/01/2016   LDLDIRECT 65.0 11/23/2017   TRIG 102.0 08/01/2016   CHOLHDL 6 08/01/2016   A/P: no recent full lipid panel- will update today nonfasting (bowl of cereal this AM)  Moderate dementia without behavioral disturbance S:  doing reasonably well on aricept 5mg . Did not tolerate namenda- very dizzy though MMSe was 17 on last check so would prefer to have him on A/P: likely mmse at next visit for update. If BP controlled likely increase aricept to 10mg - though daughter is hesitant as wants to minimize meds   Future Appointments  Date Time Provider [place]  10/18/2018 11:00 AM Gery Pray, MD [facility] None   Return in about 3 months (around 11/07/2018) for follow up- or sooner if needed.  Lab/Order associations: Essential hypertension - Plan: CBC, Comprehensive metabolic panel, Lipid panel  Other hyperlipidemia - Plan: CBC, Comprehensive metabolic panel, Lipid panel  Moderate dementia without behavioral disturbance  Return precautions advised.  Garret Reddish, MD

## 2018-08-07 NOTE — Assessment & Plan Note (Signed)
S: controlled poorly on no rx today. Should be on Amlodipine-olmesartan 5-40mg  but didn't take rx this morning (forgot) BP Readings from Last 3 Encounters:  08/07/18 (!) 150/72  05/01/18 122/66  04/12/18 (!) 145/55  A/P: We discussed blood pressure goal of <140/90. Continue current meds:  We counseled on consistently taking medicine each AM to help him not forget. Daughter will reinforce at home

## 2018-08-07 NOTE — Assessment & Plan Note (Signed)
S:  controlled on  atorvastatin 40mg  with last LDL under 100. Getting to [facility] some in the past- knows he needs to restart and daughter is supportive Lab Results  Component Value Date   CHOL 203 (H) 08/01/2016   HDL 33.40 (L) 08/01/2016   LDLCALC 150 (H) 08/01/2016   LDLDIRECT 65.0 11/23/2017   TRIG 102.0 08/01/2016   CHOLHDL 6 08/01/2016   A/P: no recent full lipid panel- will update today nonfasting (bowl of cereal this AM)

## 2018-09-11 DIAGNOSIS — H401132 Primary open-angle glaucoma, bilateral, moderate stage: Secondary | ICD-10-CM | POA: Diagnosis not present

## 2018-09-27 ENCOUNTER — Other Ambulatory Visit: Payer: Self-pay | Admitting: Family Medicine

## 2018-09-27 NOTE — Telephone Encounter (Signed)
Left voicemail requesting call back. Patient will need to come in for an office visit in order to change strength. Needs to have blood pressure check and MMSE completed.

## 2018-09-27 NOTE — Telephone Encounter (Signed)
Copied from [place] [id number]. Topic: Quick Communication - Rx Refill/Question >> Sep 27, 2018  4:11 PM Reyne Dumas L wrote: Medication: donepezil (ARICEPT) 5 MG tablet  Has the patient contacted their pharmacy? Yes - but wants different dose.  Pt's daughter would like to increase dose to 10mg .  Pt's daughter has some concerns regarding pt's increased dementia.  Pt's daughter states that she is having problems with her phone and wants to know if she can speak with the doctor when will be a good time for her to call in.  (Agent: If no, request that the patient contact the pharmacy for the refill.) (Agent: If yes, when and what did the pharmacy advise?)  Preferred Pharmacy (with phone number or street name): [facility], [place] - [place] [phone] (Phone) [phone] (Fax)  Agent: Please be advised that RX refills may take up to 3 business days. We ask that you follow-up with your pharmacy.

## 2018-09-27 NOTE — Telephone Encounter (Signed)
Spoke with daughter. Agreed to come in tomorrow. She states that her father does not accept that he has dementia. She also states that she cannot talk openly about dementia In front of her father so she was hoping she would be about to talk one on one with Dr Yong Channel.

## 2018-09-28 ENCOUNTER — Encounter: Payer: Self-pay | Admitting: Family Medicine

## 2018-09-28 ENCOUNTER — Ambulatory Visit: Payer: Medicare Other | Admitting: Family Medicine

## 2018-09-28 VITALS — BP 130/74 | HR 61 | Ht 63.0 in | Wt 190.0 lb

## 2018-09-28 DIAGNOSIS — E785 Hyperlipidemia, unspecified: Secondary | ICD-10-CM

## 2018-09-28 DIAGNOSIS — R413 Other amnesia: Secondary | ICD-10-CM | POA: Diagnosis not present

## 2018-09-28 DIAGNOSIS — F0390 Unspecified dementia without behavioral disturbance: Secondary | ICD-10-CM

## 2018-09-28 DIAGNOSIS — I10 Essential (primary) hypertension: Secondary | ICD-10-CM | POA: Diagnosis not present

## 2018-09-28 DIAGNOSIS — F03B0 Unspecified dementia, moderate, without behavioral disturbance, psychotic disturbance, mood disturbance, and anxiety: Secondary | ICD-10-CM

## 2018-09-28 MED ORDER — DONEPEZIL HCL 10 MG PO TABS: ORAL_TABLET | ORAL | 5 refills | Status: DC

## 2018-09-28 NOTE — Progress Notes (Signed)
Subjective:  Jerry [name] is a 82 y.o. year old very pleasant male patient who presents for/with See problem oriented charting ROS- No chest pain or shortness of breath. No headache or blurry vision.    Past Medical History-  Patient Active Problem List   Diagnosis Date Noted  . Cancer of upper lobe of right lung ([place]) 09/18/2017    Priority: High  . Moderate dementia without behavioral disturbance ([place]) 07/17/2015    Priority: High  . Hyperlipidemia 10/15/2014    Priority: Medium  . Gout of big toe 08/04/2008    Priority: Medium  . Essential hypertension 07/25/2007    Priority: Medium  . PROSTATE CANCER, HX OF 07/25/2007    Priority: Medium  . Aortic atherosclerosis ([place]) 01/06/2018    Priority: Low  . Pulmonary emphysema ([place])     Priority: Low  . Allergic rhinitis 10/15/2014    Priority: Low  . Overactive bladder 10/15/2014    Priority: Low  . CARDIAC MURMUR 01/10/2011    Priority: Low  . ORGANIC IMPOTENCE 08/12/2010    Priority: Low  . Exertional dyspnea 03/15/2017  . Parotiditis 02/08/2017  . Facial swelling 02/08/2017  . Lung nodule 02/08/2017  . Submandibular space infection     Medications- reviewed and updated Current Outpatient Medications  Medication Sig Dispense Refill  . amLODipine-olmesartan (AZOR) 5-40 MG tablet TAKE 1 TABLET BY MOUTH DAILY 30 tablet 5  . atorvastatin (LIPITOR) 40 MG tablet TAKE 1 TABLET(40 MG) BY MOUTH DAILY 30 tablet 5  . donepezil (ARICEPT) 5 MG tablet TAKE 1 TABLET BY MOUTH ONCE A DAY 30 tablet 5   Objective: BP 130/74 (BP Location: Left Arm, Patient Position: Sitting, Cuff Size: Normal)   Pulse 61   Ht 5\' 3"  (1.6 m)   Wt 190 lb (86.2 kg)   SpO2 99%   BMI 33.66 kg/m  Gen: NAD, resting comfortably CV: RRR 3/6 SEM (calcified aortic valve) Lungs: CTAB no crackles, wheeze, rhonchi Abdomen: soft/nontender/nondistended/normal bowel sounds. No rebound or guarding.  Ext: no edema Skin: warm, dry Neuro: some  confusion  Assessment/Plan:  Essential hypertension S: controlled on amlodipine-olmesartan 5-40mg . Had forgotten dose last visit- counseled on taking more consistently in AM BP Readings from Last 3 Encounters:  09/28/18 130/74  08/07/18 (!) 150/72  05/01/18 122/66  A/P: We discussed blood pressure goal of <140/90-at goal. Continue current meds. bp should be ok for aricept dose increase - recheck at follow up  Hyperlipidemia S: well controlled on atorvastatin 40mg  with LDL goal at least under 100 Lab Results  Component Value Date   CHOL 125 08/07/2018   HDL 33.20 (L) 08/07/2018   LDLCALC 79 08/07/2018   LDLDIRECT 65.0 11/23/2017   TRIG 64.0 08/07/2018   CHOLHDL 4 08/07/2018   A/P: continue current rx  Moderate dementia without behavioral disturbance (HCC) S:  patient has tolerated aricept 5 mg. Didn't do well on namenda- felt very dizzy.   MMSE last check 17/30.   TODay MMSE is stable at 17/30. Daughter interested in titrating up aricept now that BP is better A/P: increase aricept to 10mg - follow up in about 6 weeks to recheck   Future Appointments  Date Time Provider [place]  10/18/2018 11:00 AM Gery Pray, MD [facility] None  11/07/2018 11:30 AM Marin Olp, MD [facility]   Lab/Order associations: Memory loss - Plan: donepezil (ARICEPT) 10 MG tablet  Essential hypertension  Hyperlipidemia, unspecified hyperlipidemia type  Moderate dementia without behavioral disturbance ([place])  Meds ordered this  encounter  Medications  . donepezil (ARICEPT) 10 MG tablet    Sig: TAKE 1 TABLET BY MOUTH ONCE A DAY    Dispense:  30 tablet    Refill:  5   Return precautions advised.  Garret Reddish, MD

## 2018-09-28 NOTE — Assessment & Plan Note (Signed)
S: well controlled on atorvastatin 40mg  with LDL goal at least under 100 Lab Results  Component Value Date   CHOL 125 08/07/2018   HDL 33.20 (L) 08/07/2018   LDLCALC 79 08/07/2018   LDLDIRECT 65.0 11/23/2017   TRIG 64.0 08/07/2018   CHOLHDL 4 08/07/2018   A/P: continue current rx

## 2018-09-28 NOTE — Patient Instructions (Addendum)
Blood pressure looks great  Ok to increase to aricept/donepazil 10mg   Follow up in 3 months or sooner if needed

## 2018-09-28 NOTE — Assessment & Plan Note (Signed)
S:  patient has tolerated aricept 5 mg. Didn't do well on namenda- felt very dizzy.   MMSE last check 17/30.   TODay MMSE is stable at 17/30. Daughter interested in titrating up aricept now that BP is better A/P: increase aricept to 10mg - follow up in about 6 weeks to recheck

## 2018-09-28 NOTE — Assessment & Plan Note (Signed)
S: controlled on amlodipine-olmesartan 5-40mg . Had forgotten dose last visit- counseled on taking more consistently in AM BP Readings from Last 3 Encounters:  09/28/18 130/74  08/07/18 (!) 150/72  05/01/18 122/66  A/P: We discussed blood pressure goal of <140/90-at goal. Continue current meds. bp should be ok for aricept dose increase - recheck at follow up

## 2018-10-09 ENCOUNTER — Other Ambulatory Visit: Payer: Self-pay | Admitting: Family Medicine

## 2018-10-09 DIAGNOSIS — R413 Other amnesia: Secondary | ICD-10-CM

## 2018-10-18 ENCOUNTER — Encounter: Payer: Self-pay | Admitting: Radiation Oncology

## 2018-10-18 ENCOUNTER — Other Ambulatory Visit: Payer: Self-pay

## 2018-10-18 ENCOUNTER — Ambulatory Visit
Admission: RE | Admit: 2018-10-18 | Discharge: 2018-10-18 | Disposition: A | Discharge status: Home / Self Care | Payer: Medicare Other | Source: Ambulatory Visit | Attending: Radiation Oncology | Admitting: Radiation Oncology

## 2018-10-18 VITALS — BP 145/62 | HR 57 | Temp 98.2°F | Resp 18 | Ht 69.0 in | Wt 194.1 lb

## 2018-10-18 DIAGNOSIS — C3411 Malignant neoplasm of upper lobe, right bronchus or lung: Secondary | ICD-10-CM

## 2018-10-18 DIAGNOSIS — L989 Disorder of the skin and subcutaneous tissue, unspecified: Secondary | ICD-10-CM | POA: Diagnosis not present

## 2018-10-18 DIAGNOSIS — Z85118 Personal history of other malignant neoplasm of bronchus and lung: Secondary | ICD-10-CM | POA: Diagnosis not present

## 2018-10-18 DIAGNOSIS — Z79899 Other long term (current) drug therapy: Secondary | ICD-10-CM | POA: Insufficient documentation

## 2018-10-18 DIAGNOSIS — Z08 Encounter for follow-up examination after completed treatment for malignant neoplasm: Secondary | ICD-10-CM | POA: Diagnosis not present

## 2018-10-18 DIAGNOSIS — Z923 Personal history of irradiation: Secondary | ICD-10-CM | POA: Diagnosis not present

## 2018-10-18 NOTE — Progress Notes (Signed)
  Radiation Oncology         [phone] ________________________________  Name: Jerry [name] MRN: [id number]  Date: 10/18/2018  DOB: 02/18/1936  Follow-Up Visit Note  CC: Marin Olp, MD  Marin Olp, MD    ICD-10-CM   1. Cancer of upper lobe of right lung (HCC) C34.11     Diagnosis:   82 y.o. male with Clinical Stage IA adenocarcinoma of the right upper lobe  Interval Since Last Radiation:  1 year 5 months   Radiation treatment dates: 05/01/17-05/09/17  Site/dose:   Right lung / 54 Gy in 3 fractions (SBRT)  Narrative:  The patient returns today for routine follow-up.  His last chest CT performed 04/18/2018 showed: Previously referenced linear nodular band within the right upper lobe with surrounding changes of external beam radiation appears stable to decreased in size. Scattered parenchymal and subpleural nodules within the right middle lobe, right lower lobe, and left upper lobe are unchanged. No adenopathy.  He denies any pain or fatigue. He denies cough or hemoptysis. He denies shortness of breath or any other breathing issues. He denies any painful or difficulty swallowing. He reports gray-colored bumps on both sides of his face that did not resolve with OTC ointment. He is not on chemotherapy treatments. Daughter states that his Aricept dose was recently increased from 5 mg to 10 mg per day for his memory issues.    ALLERGIES:  is allergic to namenda [memantine].  Meds: Current Outpatient Medications  Medication Sig Dispense Refill  . amLODipine-olmesartan (AZOR) 5-40 MG tablet TAKE 1 TABLET BY MOUTH DAILY 30 tablet 5  . atorvastatin (LIPITOR) 40 MG tablet TAKE 1 TABLET(40 MG) BY MOUTH DAILY 30 tablet 5  . donepezil (ARICEPT) 10 MG tablet TAKE 1 TABLET BY MOUTH ONCE A DAY 30 tablet 5   No current facility-administered medications for this encounter.     Physical Findings: The patient is in no acute distress. Patient is alert and oriented.  height is 5'  9" (1.753 m) and weight is 194 lb 2 oz (88.1 kg). His oral temperature is 98.2 F (36.8 C). His blood pressure is 145/62 (abnormal) and his pulse is 57 (abnormal). His respiration is 18 and oxygen saturation is 100%.   Lungs are clear to auscultation bilaterally. Heart has regular rate and rhythm. No palpable cervical, supraclavicular, or axillary adenopathy. Abdomen soft, non-tender, normal bowel sounds.  Lab Findings: Lab Results  Component Value Date   WBC 6.5 08/07/2018   HGB 12.6 (L) 08/07/2018   HCT 37.9 (L) 08/07/2018   MCV 90.3 08/07/2018   PLT 165.0 08/07/2018    Radiographic Findings: No results found.  Impression:Clinical Stage IA adenocarcinoma of the right upper lobe, s/p SBRT. No evidence of recurrence on clinical exam or chest CT scan 6 months ago.   Plan:  Patient will undergo repeat Chest CT in the next couple of weeks. Return for follow-up in radiation oncology in 6 months. Advised patient to see PCP or dermatology concerning the bumps on his face.  ____________________________________  Blair Promise, PhD, MD  This document serves as a record of services personally performed by Gery Pray, MD. It was created on his behalf by Rae Lips, a trained medical scribe. The creation of this record is based on the scribe's personal observations and the provider's statements to them. This document has been checked and approved by the attending provider.

## 2018-10-18 NOTE — Progress Notes (Signed)
Pt here today for a follow up for lung cancer. Pt denies having pain or fatigue. Pt  Denies having a cough or hemoptysis. Pt denies having shortness of breath. Pt denies having any painful or difficulty swallowing. Pt denies having skin irritation. Pt is not on chemotherapy treatments.   BP (!) 145/62 (BP Location: Left Arm, Patient Position: Sitting)   Pulse (!) 57   Temp 98.2 F (36.8 C) (Oral)   Resp 18   Ht 5\' 9"  (1.753 m)   Wt 194 lb 2 oz (88.1 kg)   SpO2 100%   BMI 28.67 kg/m    Wt Readings from Last 3 Encounters:  10/18/18 194 lb 2 oz (88.1 kg)  09/28/18 190 lb (86.2 kg)  08/07/18 192 lb (87.1 kg)

## 2018-11-07 ENCOUNTER — Encounter: Payer: Self-pay | Admitting: Family Medicine

## 2018-11-07 ENCOUNTER — Ambulatory Visit (INDEPENDENT_AMBULATORY_CARE_PROVIDER_SITE_OTHER): Payer: Medicare Other | Admitting: Family Medicine

## 2018-11-07 VITALS — BP 132/64 | HR 56 | Temp 97.6°F | Ht 69.0 in | Wt 196.0 lb

## 2018-11-07 DIAGNOSIS — Z23 Encounter for immunization: Secondary | ICD-10-CM | POA: Diagnosis not present

## 2018-11-07 DIAGNOSIS — F0390 Unspecified dementia without behavioral disturbance: Secondary | ICD-10-CM | POA: Diagnosis not present

## 2018-11-07 DIAGNOSIS — F03B0 Unspecified dementia, moderate, without behavioral disturbance, psychotic disturbance, mood disturbance, and anxiety: Secondary | ICD-10-CM

## 2018-11-07 DIAGNOSIS — I10 Essential (primary) hypertension: Secondary | ICD-10-CM

## 2018-11-07 NOTE — Addendum Note (Signed)
Addended by: Lyndle Herrlich on: 11/07/2018 03:27 PM   Modules accepted: Orders

## 2018-11-07 NOTE — Progress Notes (Signed)
Subjective:  Jerry [name] is a 82 y.o. year old very pleasant male patient who presents for/with See problem oriented charting ROS- continued issues with confusion.  No reported chest pain or shortness of breath.  Past Medical History-  Patient Active Problem List   Diagnosis Date Noted  . Cancer of upper lobe of right lung ([place]) 09/18/2017    Priority: High  . Moderate dementia without behavioral disturbance ([place]) 07/17/2015    Priority: High  . Hyperlipidemia 10/15/2014    Priority: Medium  . Gout of big toe 08/04/2008    Priority: Medium  . Essential hypertension 07/25/2007    Priority: Medium  . PROSTATE CANCER, HX OF 07/25/2007    Priority: Medium  . Aortic atherosclerosis ([place]) 01/06/2018    Priority: Low  . Pulmonary emphysema ([place])     Priority: Low  . Allergic rhinitis 10/15/2014    Priority: Low  . Overactive bladder 10/15/2014    Priority: Low  . CARDIAC MURMUR 01/10/2011    Priority: Low  . ORGANIC IMPOTENCE 08/12/2010    Priority: Low  . Exertional dyspnea 03/15/2017  . Parotiditis 02/08/2017  . Facial swelling 02/08/2017  . Lung nodule 02/08/2017  . Submandibular space infection     Medications- reviewed and updated Current Outpatient Medications  Medication Sig Dispense Refill  . amLODipine-olmesartan (AZOR) 5-40 MG tablet TAKE 1 TABLET BY MOUTH DAILY 30 tablet 5  . atorvastatin (LIPITOR) 40 MG tablet TAKE 1 TABLET(40 MG) BY MOUTH DAILY 30 tablet 5  . donepezil (ARICEPT) 10 MG tablet TAKE 1 TABLET BY MOUTH ONCE A DAY 30 tablet 5   No current facility-administered medications for this visit.     Objective: BP 132/64 (BP Location: Left Arm, Patient Position: Sitting, Cuff Size: Large)   Pulse (!) 56   Temp 97.6 F (36.4 C) (Oral)   Ht 5\' 9"  (1.753 m)   Wt 196 lb (88.9 kg)   SpO2 98%   BMI 28.94 kg/m  Gen: NAD, resting comfortably CV: RRR no murmurs rubs or gallops Lungs: CTAB no crackles, wheeze, rhonchi Abdomen: soft/nontender Ext: no  edema Skin: warm, dry Neuro: Can have some tangential thoughts.  Moves all extremities  Assessment/Plan:  Essential hypertension S: controlled on amlodipine-olmesartan 5-40 mg. BP Readings from Last 3 Encounters:  11/07/18 132/64  10/18/18 (!) 145/62  09/28/18 130/74  A/P: We discussed blood pressure goal of <140/90. Continue current meds-luckily tolerating increased Aricept dose  Moderate dementia without behavioral disturbance ([place]) S: Patient had dizziness on Namenda in the past.  Had tolerated Aricept 5 mg-we increased to 10 mg at last visit.  He seems to be tolerating this dose well.  MMSE last visit was 17 out of 30. A/P: stable- no change in Side effects - so continue at aricept 10mg  dose   Future Appointments  Date Time Provider [place]  02/05/2019 10:40 AM Marin Olp, MD [facility]  04/25/2019 11:30 AM Gery Pray, MD [facility] None  3 month follow up requested by daughter  Lab/Order associations: Flu shot high dose given today- to be entered by [employer], LPN Return precautions advised.  Garret Reddish, MD

## 2018-11-07 NOTE — Assessment & Plan Note (Signed)
S: Patient had dizziness on Namenda in the past.  Had tolerated Aricept 5 mg-we increased to 10 mg at last visit.  He seems to be tolerating this dose well.  MMSE last visit was 17 out of 30. A/P: stable- no change in Side effects - so continue at aricept 10mg  dose

## 2018-11-07 NOTE — Assessment & Plan Note (Signed)
S: controlled on amlodipine-olmesartan 5-40 mg. BP Readings from Last 3 Encounters:  11/07/18 132/64  10/18/18 (!) 145/62  09/28/18 130/74  A/P: We discussed blood pressure goal of <140/90. Continue current meds-luckily tolerating increased Aricept dose

## 2018-11-07 NOTE — Patient Instructions (Addendum)
Blood pressure looks great  Glad you are tolerating the aricept

## 2019-01-23 ENCOUNTER — Other Ambulatory Visit: Payer: Self-pay | Admitting: Family Medicine

## 2019-01-23 DIAGNOSIS — E785 Hyperlipidemia, unspecified: Secondary | ICD-10-CM

## 2019-01-23 NOTE — Telephone Encounter (Signed)
Last OV 11/07/2018 Last refill Azor 07/18/18 #30/5 Last refill Lipitor 06/04/18 #30/5 Next OV 02/05/2019

## 2019-01-28 ENCOUNTER — Telehealth: Payer: Self-pay | Admitting: Family Medicine

## 2019-01-28 NOTE — Telephone Encounter (Signed)
See note

## 2019-01-28 NOTE — Telephone Encounter (Signed)
Copied from [place] [id number]. Topic: Quick Communication - See Telephone Encounter >> Jan 28, 2019  4:07 PM Vernona Rieger wrote: CRM for notification. See Telephone encounter for: 01/28/19. Patient's daughter, Cristopher Estimable states she lives in [place] but knows from her other sister that he is getting worse with his dementia. She said that he is going to his bank often and trying to close down his bank account. She said she is his power of attorney and she does not need him to do this because she pays his bills. She said that they will allow him to do this. She would like to know could Dr Yong Channel write something up with a diagnosis to keep in his file at the bank so that he has no right to make any changes on his account or close the account out. She does not want him off the account but just not be able to do those things. The bank advised her of this. She said she needs some kind of help to keep him in his home. She does not want him to be in a facility right now but states he is getting worse, forgetting to eat, thinks people are taking things from him and constantly walking from his home alone to the bank on several occasions during the week. She would like to know what they can do from this point as far as some help at home. Please contact her @ [phone]

## 2019-01-29 NOTE — Telephone Encounter (Signed)
I do not have a lot to offer here unfortunately. We ordered home health last year but patient refused. Without family there to encourage patient to accept the appointment- not sure what else we can do.   Family can pay to have caretakers in the home spend more time with him.   As far as the bank- why do they want patient to remain on the account? I would think would make sense for him to remain on the account. What privileges do they want him to have- I need more assistance with writing the letter to give bank guidelines.   Looks like we have a visit on the 11th that we can discuss further as well.

## 2019-02-04 NOTE — Telephone Encounter (Signed)
Called and left a voicemail message asking for a return phone call 

## 2019-02-05 ENCOUNTER — Ambulatory Visit: Payer: Medicare Other | Admitting: Family Medicine

## 2019-02-08 NOTE — Telephone Encounter (Signed)
See note

## 2019-02-08 NOTE — Telephone Encounter (Signed)
Patient's daughter, Aida Puffer, returning call to [place]. She is requesting a call back.

## 2019-02-11 NOTE — Telephone Encounter (Signed)
Have them speak with the bank- write exactly what they would need written from me and then they can give me a copy to review at the visit. I can then type that up if appropriate.

## 2019-02-11 NOTE — Telephone Encounter (Signed)
Left message to return phone call.

## 2019-02-11 NOTE — Telephone Encounter (Signed)
Spoke to Jerry [name],Jerry [name] [phone] and she stated that they need the bank account to remain open. Jerry [name] stated that they use the account to pay the pt bills. They stated that the pt goes to the bank to withdraw money and does not pay his bills with it. Now they withdraw his money to pay his bills and they put $30 in so that pt will not get upset when he is told he has no money on the account. She stated that they now have a cousin who does [place] for pt for 3 days a week and she is helping him out now. She stated that if we can put the referral in for 2 days a week to cover the other days it would be great. Pt is now more open to [facility] now that he has his cousin helping. Jerry [name] is wanting the dementia DX changed since he cannot live on his own so that she can be the payee on the pt account to help with his bills.  Also she requested that the pt not be questioned about the account because it would upset pt. Jerry [name] was informed that pt may be questioned. Jerry [name] verbalized understanding. I also advised her if it was okay to get more information at the pt appt. Time. She stated that this issue was okay to hold off until Friday 02/15/2019. Jerry [name] is also wanting the pt other [name] to come back with the pt to discuss issue but pt will not let her. I informed her that if it was possible we may be able to ask her questions in a separate room. No further action needed at the moment.

## 2019-02-12 NOTE — Telephone Encounter (Signed)
Called back no answer vm box is now full.

## 2019-02-12 NOTE — Telephone Encounter (Signed)
Left message to return phone call.

## 2019-02-12 NOTE — Telephone Encounter (Signed)
Spoke to Jerry [name] and informed her of update. Jerry [name] said she will call and get that information from the bank via phone since she does not live in [place]. I asked if Jerry [name] pt other daughter would be able to still assist pt to Dr appt. As stated before. She stated "yes". I then asked Jerry [name] Jerry [name] could get the information from the bank and bring it with her. Jerry [name] stated that she couldn't due to having transportation challenges. Jerry [name] wanted an email that she could send the information to I advised her that there is none. Jerry [name] was then advised to send it via fax so that we could have the information at the pt doctors appt. Jerry [name] verbalized understanding. No further action needed at this time.

## 2019-02-12 NOTE — Telephone Encounter (Signed)
Wyatt Portela called in retuning call back to [facility]

## 2019-02-12 NOTE — Telephone Encounter (Signed)
See note

## 2019-02-15 ENCOUNTER — Ambulatory Visit: Payer: Medicare Other | Admitting: Family Medicine

## 2019-03-05 ENCOUNTER — Ambulatory Visit (INDEPENDENT_AMBULATORY_CARE_PROVIDER_SITE_OTHER): Payer: Medicare Other | Admitting: Family Medicine

## 2019-03-05 ENCOUNTER — Encounter: Payer: Self-pay | Admitting: Family Medicine

## 2019-03-05 VITALS — BP 132/60 | HR 61 | Temp 97.6°F | Ht 69.0 in | Wt 201.8 lb

## 2019-03-05 DIAGNOSIS — Z6829 Body mass index (BMI) 29.0-29.9, adult: Secondary | ICD-10-CM

## 2019-03-05 DIAGNOSIS — J439 Emphysema, unspecified: Secondary | ICD-10-CM

## 2019-03-05 DIAGNOSIS — F0390 Unspecified dementia without behavioral disturbance: Secondary | ICD-10-CM | POA: Diagnosis not present

## 2019-03-05 DIAGNOSIS — I700 Atherosclerosis of aorta: Secondary | ICD-10-CM

## 2019-03-05 DIAGNOSIS — I10 Essential (primary) hypertension: Secondary | ICD-10-CM | POA: Diagnosis not present

## 2019-03-05 DIAGNOSIS — E663 Overweight: Secondary | ICD-10-CM

## 2019-03-05 DIAGNOSIS — C3411 Malignant neoplasm of upper lobe, right bronchus or lung: Secondary | ICD-10-CM

## 2019-03-05 DIAGNOSIS — F03B0 Unspecified dementia, moderate, without behavioral disturbance, psychotic disturbance, mood disturbance, and anxiety: Secondary | ICD-10-CM

## 2019-03-05 DIAGNOSIS — E785 Hyperlipidemia, unspecified: Secondary | ICD-10-CM

## 2019-03-05 NOTE — Progress Notes (Signed)
Phone [phone]   Subjective:  Jerry [name] is a 83 y.o. year old very pleasant male patient who presents for/with See problem oriented charting ROS-  No chest pain or shortness of breath. No headache or blurry vision.    Past Medical History-  Patient Active Problem List   Diagnosis Date Noted  . Cancer of upper lobe of right lung ([place]) 09/18/2017    Priority: High  . Moderate dementia without behavioral disturbance ([place]) 07/17/2015    Priority: High  . Hyperlipidemia 10/15/2014    Priority: Medium  . Gout of big toe 08/04/2008    Priority: Medium  . Essential hypertension 07/25/2007    Priority: Medium  . PROSTATE CANCER, HX OF 07/25/2007    Priority: Medium  . Aortic atherosclerosis ([place]) 01/06/2018    Priority: Low  . Pulmonary emphysema ([place])     Priority: Low  . Allergic rhinitis 10/15/2014    Priority: Low  . Overactive bladder 10/15/2014    Priority: Low  . CARDIAC MURMUR 01/10/2011    Priority: Low  . ORGANIC IMPOTENCE 08/12/2010    Priority: Low  . Exertional dyspnea 03/15/2017  . Parotiditis 02/08/2017  . Facial swelling 02/08/2017  . Lung nodule 02/08/2017  . Submandibular space infection     Medications- reviewed and updated Current Outpatient Medications  Medication Sig Dispense Refill  . amLODipine-olmesartan (AZOR) 5-40 MG tablet TAKE 1 TABLET BY MOUTH DAILY 90 tablet 1  . atorvastatin (LIPITOR) 40 MG tablet TAKE 1 TABLET(40 MG) BY MOUTH DAILY 90 tablet 1  . donepezil (ARICEPT) 10 MG tablet TAKE 1 TABLET BY MOUTH ONCE A DAY 30 tablet 5   No current facility-administered medications for this visit.      Objective:  BP 132/60 (BP Location: Right Arm, Patient Position: Sitting, Cuff Size: Normal)   Pulse 61   Temp 97.6 F (36.4 C) (Oral)   Ht 5\' 9"  (1.753 m)   Wt 201 lb 12.8 oz (91.5 kg)   SpO2 97%   BMI 29.80 kg/m  Gen: NAD, resting comfortably CV: RRR no murmurs rubs or gallops Lungs: CTAB no crackles, wheeze, rhonchi Abdomen:  soft/nontender/nondistended/normal bowel sounds. Overweight.  Ext: no edema Skin: warm, dry    Assessment and Plan   #Hypertension S: Compliant with amlodipine-olmesartan 5-40 mg. A/P:  Stable. Continue current medications.      #Dementia with behavioral disturbance. S: Patient has tolerated 10 mg of Aricept well.  MMSE October 2019 was 17 out of 30.  Has not tolerated Namenda in the past due to dizziness. A/P: Stable. Continue current medications.    #Hyperlipidemia/aortic atherosclerosis S: Patient is compliant with atorvastatin 40 mg A/P: Reasonably well-controlled with LDL under 100- continue current medication.  No strong indication to push her LDL under 70-does have aortic atherosclerosis but I think current control is reasonable  #Pulmonary emphysema-has been recognized by pulmonary the patient denies any shortness of breath or wheezing.  Continue to monitor.  # has upcoming appointment with Dr. Sondra Come of oncology at 73 30 on 04/25/2019- still listed as lung cancer history- continue close oncology follow up  #overweight- dont really want significant weight change but recommended healthy diet and exercising regularly. Considering getting back to [facility] [address]   Future Appointments  Date Time Provider [place]  04/25/2019 11:30 AM Gery Pray, MD [facility] None   Return in about 3 months (around 06/05/2019) for follow up- or sooner if needed.  Lab/Order associations: Essential hypertension  Hyperlipidemia, unspecified hyperlipidemia type  Moderate dementia without  behavioral disturbance ([place])  Pulmonary emphysema, unspecified emphysema type ([place])  Aortic atherosclerosis ([place])  Cancer of upper lobe of right lung ([place])  Return precautions advised.  Garret Reddish, MD

## 2019-03-05 NOTE — Telephone Encounter (Signed)
Noted  

## 2019-03-05 NOTE — Telephone Encounter (Signed)
Please write the following letter and send to either [facility] for review before going to bank:  To whom it may concern,   Patient has a diagnosis of dementia. As a result, his decision making ability is reduced. I am requesting that his current account remain open with [employer] [place] as family uses this account to may his mortgage and all of his bills. I would request that his account not be altered, closed, or restricted in regards to account being a joint account without the involvement of power of attorney [name].   Thank you, Garret Reddish

## 2019-03-05 NOTE — Patient Instructions (Addendum)
Glad you are doing so well!   No changes in meds. We will update labs next visit.

## 2019-03-05 NOTE — Telephone Encounter (Signed)
Jerry [name] will fax bank info today prior to her dad appointment

## 2019-03-07 NOTE — Telephone Encounter (Signed)
Noted! Letter has been created and printed will fax to pt daughter.

## 2019-03-07 NOTE — Telephone Encounter (Signed)
Called pt daughter for update but no answer. Pt doesn't have a vm. Not sure if she wants the letter faxed or mailed.

## 2019-03-18 ENCOUNTER — Telehealth: Payer: Self-pay | Admitting: Family Medicine

## 2019-03-18 DIAGNOSIS — R413 Other amnesia: Secondary | ICD-10-CM

## 2019-03-19 ENCOUNTER — Telehealth: Payer: Self-pay

## 2019-03-19 NOTE — Telephone Encounter (Signed)
Rx refilled for 90 day supply. Called [name] and left VM to call the office.

## 2019-03-19 NOTE — Telephone Encounter (Signed)
Daughter returned call and was advised. No further action required at this time.

## 2019-03-19 NOTE — Telephone Encounter (Signed)
See note  Copied from [place] [phone]. Topic: General - Inquiry >> Mar 18, 2019  4:55 PM Alanda Slim E wrote: Reason for CRM: Pts daughter is coming to get the Pt and have him stay with her in [place]. She is coming to get him on 3.24.20 and leaving out on 3.25.20/ Their only concern is having his medications or a written Rx that she can have to give to the [facility] in [place] or if she can pick up from the Pts [place] pharmacy before they leave/ please advise and call Daughter([name]) asap cb# [phone]

## 2019-03-19 NOTE — Telephone Encounter (Signed)
Spoke to pt [name]. She stated that with the [facility] pandemic, pt caregiver has canceled clients for 3 weeks leaving her father alone in his house. Jerry [name] stated that her sister Jerry [name] cannot get to the pt to help out due to transportation issues. Jerry [name] stated that [place] the bus to see pt and also brings kids and visitors. Jerry [name] is worried about germs and Covid-19 since the visitors or her sister is not taking the Corona virus seriously. Also, [name] expressed that bus transportation will be closing soon per [place]. She is now worried about the pt being at home alone Again. Jerry [name] is wanting to be able to take care of pt and try to keep him safe from Covid-19. However, pt stated that her state I on lock down from traveling. Jerry [name] is needing a letter stating:  1) Purpose of travel  2) who she is to the pt 3) why she is traveling and pt dx that puts him at risk for Covid-19.   [name] stated in the beginning that the pt was fine with going with [name]. However, after Jerry [name] expressed concerns to pt about him traveling, now pt is declining to go with [place]. Jerry [name] stated that she will try to convince him and is his POA but does not want to forced her dad to go and hate to pull rank or POA but she will have to do it if she cannot convince him to come.   Jerry [name] is wanting the letter today if possible so that she can leave this am around 5am. Jerry [name] was informed that Dr. Yong Channel is busy due to Covid-19 but we will try to get around to it today. [name] verbalized understanding and stated that even if its later tonight this will be fine.   Jerry [name] has been set up for pt Mychart. POA has been verified. Per verbally from [place], she can be added if POA has been verified.

## 2019-03-19 NOTE — Telephone Encounter (Signed)
May write the following letter  To whom it may concern,  Jerry [name] (add her last name) is the daughter and healthcare power of attorney of Jerry [name].  Jerry [name] suffers from dementia and depends on caregivers coming into his home to care for him-unfortunately with the covid-19 pandemic-these visits have been canceled.  Patient has limited resources in regards to family in the area.  Jerry [name] is requesting the ability to travel to be able to care for her father.  Patient's advanced age at age 83 as well as history of lung and prostate cancer and dementia-which limits his ability to self monitor for symptoms-place him at high risk for developing covid-19.  Thank you, Jerry [name]

## 2019-03-20 NOTE — Telephone Encounter (Signed)
Called [name] and advised that letter has been released to [place].

## 2019-04-25 ENCOUNTER — Ambulatory Visit: Payer: Medicare Other | Admitting: Radiation Oncology

## 2019-05-29 ENCOUNTER — Ambulatory Visit (HOSPITAL_COMMUNITY): Admission: RE | Admit: 2019-05-29 | Payer: Medicare Other | Source: Ambulatory Visit

## 2019-05-30 ENCOUNTER — Ambulatory Visit: Payer: Medicare Other | Admitting: Radiation Oncology

## 2019-06-05 ENCOUNTER — Other Ambulatory Visit: Payer: Self-pay

## 2019-06-05 ENCOUNTER — Ambulatory Visit (HOSPITAL_COMMUNITY)
Admission: RE | Admit: 2019-06-05 | Discharge: 2019-06-05 | Disposition: A | Discharge status: Home / Self Care | Payer: Medicare Other | Source: Ambulatory Visit | Attending: Radiation Oncology | Admitting: Radiation Oncology

## 2019-06-05 DIAGNOSIS — C3411 Malignant neoplasm of upper lobe, right bronchus or lung: Secondary | ICD-10-CM | POA: Diagnosis not present

## 2019-06-05 DIAGNOSIS — C3490 Malignant neoplasm of unspecified part of unspecified bronchus or lung: Secondary | ICD-10-CM | POA: Diagnosis not present

## 2019-06-10 ENCOUNTER — Ambulatory Visit
Admission: RE | Admit: 2019-06-10 | Discharge: 2019-06-10 | Disposition: A | Discharge status: Home / Self Care | Payer: Medicare Other | Source: Ambulatory Visit | Attending: Radiation Oncology | Admitting: Radiation Oncology

## 2019-06-11 ENCOUNTER — Telehealth: Payer: Self-pay | Admitting: *Deleted

## 2019-06-11 NOTE — Telephone Encounter (Signed)
CALLED PATIENT TO RESCHEDULE FU FROM 06-10-19, RESCHEDULED FOR 06-20-19 @ 9:15 AM, SPOKE WITH [name] (DAUGHTER) AND SHE AGREED TO THIS DAY AND TIME

## 2019-06-18 ENCOUNTER — Other Ambulatory Visit: Payer: Self-pay

## 2019-06-18 ENCOUNTER — Ambulatory Visit (INDEPENDENT_AMBULATORY_CARE_PROVIDER_SITE_OTHER): Payer: Medicare Other | Admitting: Family Medicine

## 2019-06-18 ENCOUNTER — Encounter: Payer: Self-pay | Admitting: Family Medicine

## 2019-06-18 VITALS — BP 138/64 | HR 63 | Temp 98.2°F | Ht 69.0 in | Wt 206.6 lb

## 2019-06-18 DIAGNOSIS — E785 Hyperlipidemia, unspecified: Secondary | ICD-10-CM

## 2019-06-18 DIAGNOSIS — I10 Essential (primary) hypertension: Secondary | ICD-10-CM | POA: Diagnosis not present

## 2019-06-18 DIAGNOSIS — F0390 Unspecified dementia without behavioral disturbance: Secondary | ICD-10-CM | POA: Diagnosis not present

## 2019-06-18 DIAGNOSIS — Z0000 Encounter for general adult medical examination without abnormal findings: Secondary | ICD-10-CM

## 2019-06-18 DIAGNOSIS — Z1211 Encounter for screening for malignant neoplasm of colon: Secondary | ICD-10-CM

## 2019-06-18 DIAGNOSIS — J439 Emphysema, unspecified: Secondary | ICD-10-CM

## 2019-06-18 DIAGNOSIS — F03B0 Unspecified dementia, moderate, without behavioral disturbance, psychotic disturbance, mood disturbance, and anxiety: Secondary | ICD-10-CM

## 2019-06-18 DIAGNOSIS — Z8546 Personal history of malignant neoplasm of prostate: Secondary | ICD-10-CM | POA: Diagnosis not present

## 2019-06-18 DIAGNOSIS — C3411 Malignant neoplasm of upper lobe, right bronchus or lung: Secondary | ICD-10-CM | POA: Diagnosis not present

## 2019-06-18 DIAGNOSIS — I700 Atherosclerosis of aorta: Secondary | ICD-10-CM

## 2019-06-18 DIAGNOSIS — R6889 Other general symptoms and signs: Secondary | ICD-10-CM

## 2019-06-18 NOTE — Patient Instructions (Addendum)
No changes today   4-6 mont follow up - sooner if you need [place]  Get flu shot in the fall

## 2019-06-18 NOTE — Progress Notes (Signed)
Phone: [phone]   Subjective:  Patient presents today for their annual physical. Chief complaint-noted.   See problem oriented charting- ROS- full  review of systems was completed and negative including No fever, chills, cough, congestion, runny nose, shortness of breath, fatigue, body aches, sore throat, headache, nausea, vomiting, diarrhea, or new loss of taste or smell. No known contacts with covid 19 or someone being tested for covid 19.   The following were reviewed and entered/updated in epic: Past Medical History:  Diagnosis Date  . Cancer of prostate ([place]) 2001  . Chronic kidney disease (CKD), stage III (moderate) ([place])    [name] 02/08/2017  . Dementia ([place])   . DIVERTICULOSIS, COLON 07/25/2007   Qualifier: Diagnosis of  By: [name], LPN, [name]   . High cholesterol   . History of radiation therapy 05/01/17-05/09/17   right lung 54 Gy in 3 fractions  . Hypertension   . Lung cancer ([place]) 03/2017   Patient Active Problem List   Diagnosis Date Noted  . Cancer of upper lobe of right lung ([place]) 09/18/2017    Priority: High  . Moderate dementia without behavioral disturbance ([place]) 07/17/2015    Priority: High  . Hyperlipidemia 10/15/2014    Priority: Medium  . Gout of big toe 08/04/2008    Priority: Medium  . Essential hypertension 07/25/2007    Priority: Medium  . PROSTATE CANCER, HX OF 07/25/2007    Priority: Medium  . Aortic atherosclerosis ([place]) 01/06/2018    Priority: Low  . Pulmonary emphysema ([place])     Priority: Low  . Allergic rhinitis 10/15/2014    Priority: Low  . Overactive bladder 10/15/2014    Priority: Low  . CARDIAC MURMUR 01/10/2011    Priority: Low  . ORGANIC IMPOTENCE 08/12/2010    Priority: Low  . Exertional dyspnea 03/15/2017  . Parotiditis 02/08/2017  . Facial swelling 02/08/2017  . Lung nodule 02/08/2017  . Submandibular space infection    Past Surgical History:  Procedure Laterality Date  . DIRECT LARYNGOSCOPY N/A 06/21/2017   Procedure: DIRECT LARYNGOSCOPY;  Surgeon: Melissa Montane, MD;  Location: [facility];  Service: ENT;  Laterality: N/A;  . EYE SURGERY    . INSERTION PROSTATE RADIATION SEED  07/2000   [name] 05/09/2011  . PENILE PROSTHESIS IMPLANT  02/2002   Insertion of Mentor three piece inflatable penile prosthesis./notes 05/09/2011  . PENILE PROSTHESIS PLACEMENT  06/2003   Placement of Mentor rod prosthesis/notes 05/09/2011  . REFRACTIVE SURGERY Bilateral   . REMOVAL OF PENILE PROSTHESIS  06/2003   Removal of three-piece Mentor prosthesis/notes 05/09/2011  . SCROTAL SURGERY  06/2003   Repair of scrotal defect/notes 05/09/2011    Family History  Problem Relation Age of Onset  . Early death Father        cirrhosis  . Alcoholism Father   . Cirrhosis Unknown   . Breast cancer Sister     Medications- reviewed and updated Current Outpatient Medications  Medication Sig Dispense Refill  . amLODipine-olmesartan (AZOR) 5-40 MG tablet TAKE 1 TABLET BY MOUTH DAILY 90 tablet 1  . atorvastatin (LIPITOR) 40 MG tablet TAKE 1 TABLET(40 MG) BY MOUTH DAILY 90 tablet 1  . donepezil (ARICEPT) 10 MG tablet TAKE 1 TABLET BY MOUTH DAILY 90 tablet 1   No current facility-administered medications for this visit.     Allergies-reviewed and updated Allergies  Allergen Reactions  . Namenda [Memantine] Other (See Comments)    Made pt feel dizzy    Social History   Social  History Narrative   Separated since 2010. 4 kids. 4 grandkids. 2 greatgrandkids.       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 Dementia unfortunately stopped these travels.    Objective  Objective:  BP 138/64 (BP Location: Left Arm, Patient Position: Sitting, Cuff Size: Large)   Pulse 63   Temp 98.2 F (36.8 C) (Oral)   Ht 5\' 9"  (1.753 m)   Wt 206 lb 9.6 oz (93.7 kg)   SpO2 97%   BMI 30.51 kg/m  Gen: NAD, resting comfortably HEENT: Mucous membranes are moist. Oropharynx normal Neck: no thyromegaly  CV: RRR .  Stable murmur  3/6- known aortic sclerosis lungs: CTAB no crackles, wheeze, rhonchi Abdomen: soft/nontender/nondistended/normal bowel sounds. No rebound or guarding.  Ext: no edema Skin: warm, dry Neuro: grossly normal, moves all extremities, PERRLA   Assessment and Plan  83 y.o. male presenting for annual physical.  Health Maintenance counseling: 1. Anticipatory guidance: Patient counseled regarding regular dental exams -q6 months advised (outside of covid 19), eye exams -yearly,  avoiding smoking and second hand smoke , limiting alcohol to 2 beverages per day - not drinking.   2. Risk factor reduction:  Advised patient of need for regular exercise and diet rich and fruits and vegetables to reduce risk of heart attack and stroke. Exercise- mows yard some- not doing as much walking- discussed some ways to get exercise. Diet-  Up 13 pounds from last physical- was up with daughter in [place] and heavier cooking and that may have contributed Wt Readings from Last 3 Encounters:  06/18/19 206 lb 9.6 oz (93.7 kg)  03/05/19 201 lb 12.8 oz (91.5 kg)  11/07/18 196 lb (88.9 kg)  3. Immunizations/screenings/ancillary studies-could get Shingrix but will defer with COVID-19  Immunization History  Administered Date(s) Administered  . Influenza Whole 11/06/2007, 10/07/2008, 12/09/2010  . Influenza, High Dose Seasonal PF 08/22/2016, 11/07/2018  . Influenza,inj,Quad PF,6+ Mos 10/15/2014  . Pneumococcal Conjugate-13 05/08/2015  . Pneumococcal Polysaccharide-23 11/06/2007  . Td 12/26/2006, 05/03/2018   4. Prostate cancer screening- has followed at [facility] in the past given history of prostate cancer.  Has seen Dr. Amalia Hailey in past- they ask for repeat today  Lab Results  Component Value Date   PSA 0.05 (L) 08/01/2016   5. Colon cancer screening - past age based screening- not sure if he had colonoscopy - actually thinks likely not- we agreed to 1x stool cards and if negative and not iron  deficient discontinue screening given age 30. Skin cancer screening-low risk due to prior melanin levels.   Advised regular sunscreen use. Denies worrisome, changing, or new skin lesions.  30.  Former smoker-quit in [place] of chronic or acute concerns  Lung cancer-status post radiation with yearly scans with Dr. Zara Chess completed and appears stable  Dementia without behavioral disturbance-Patient remains on Aricept.  Did not tolerate Namenda due to dizziness.  Consider repeating MMSE at future visit-last on October 2019 and was 17 out of 30.  Does not follow with neurology  Hypertension-controlled on amlodipine-olmesartan 5-40 mg  Hyperlipidemia/aortic atherosclerosis- issues with compliance in the past.  We will update lipid panel today.  Patient states compliant with atorvastatin 40 mg- reasonable treatment for hyperlipidemia and aortic atherosclerosis.  Emphysema-noted on prior CTs- no inhalers and appears to be doing well after radiation for lung cancer  Cold natured- check tsh and iron per patient request  Future Appointments  Date Time Provider  [place]  06/20/2019  9:15 AM Gery Pray, MD [facility] None   No follow-ups on file.  Lab/Order associations: NOT FASTING     ICD-10-CM   1. Preventative health care  Z00.00 CBC    Comprehensive metabolic panel    Lipid panel    PSA    TSH    IBC + Ferritin    Fecal occult blood, imunochemical  2. Essential hypertension  I10   3. Hyperlipidemia, unspecified hyperlipidemia type  E78.5 CBC    Comprehensive metabolic panel    Lipid panel  4. Moderate dementia without behavioral disturbance (HCC)  F03.90   5. Cancer of upper lobe of right lung ([place])  C34.11   6. Aortic atherosclerosis (HCC)  I70.0   7. Pulmonary emphysema, unspecified emphysema type ([place])  J43.9   8. Screen for colon cancer  Z12.11 Fecal occult blood, imunochemical  9. Cold intolerance  R68.89 TSH    IBC + Ferritin  10. History of  prostate cancer  Z85.46 PSA    No orders of the defined types were placed in this encounter.   Return precautions advised.  Garret Reddish, MD

## 2019-06-19 LAB — CBC
HCT: 36.8 % — ABNORMAL LOW (ref 39.0–52.0)
Hemoglobin: 12.2 g/dL — ABNORMAL LOW (ref 13.0–17.0)
MCHC: 33.2 g/dL (ref 30.0–36.0)
MCV: 90.3 fl (ref 78.0–100.0)
Platelets: 177 10*3/uL (ref 150.0–400.0)
RBC: 4.08 Mil/uL — ABNORMAL LOW (ref 4.22–5.81)
RDW: 15.3 % (ref 11.5–15.5)
WBC: 7.2 10*3/uL (ref 4.0–10.5)

## 2019-06-19 LAB — COMPREHENSIVE METABOLIC PANEL
ALT: 64 U/L — ABNORMAL HIGH (ref 0–53)
AST: 39 U/L — ABNORMAL HIGH (ref 0–37)
Albumin: 3.9 g/dL (ref 3.5–5.2)
Alkaline Phosphatase: 65 U/L (ref 39–117)
BUN: 15 mg/dL (ref 6–23)
CO2: 28 mEq/L (ref 19–32)
Calcium: 8.9 mg/dL (ref 8.4–10.5)
Chloride: 110 mEq/L (ref 96–112)
Creatinine, Ser: 1.21 mg/dL (ref 0.40–1.50)
GFR: 69.65 mL/min (ref >60.00)
Glucose, Bld: 90 mg/dL (ref 70–99)
Potassium: 4.1 mEq/L (ref 3.5–5.1)
Sodium: 144 mEq/L (ref 135–145)
Total Bilirubin: 0.5 mg/dL (ref 0.2–1.2)
Total Protein: 6.7 g/dL (ref 6.0–8.3)

## 2019-06-19 LAB — LIPID PANEL
Cholesterol: 142 mg/dL (ref 0–200)
HDL: 40.8 mg/dL (ref >39.00)
LDL Cholesterol: 80 mg/dL (ref 0–99)
NonHDL: 100.77
Total CHOL/HDL Ratio: 3
Triglycerides: 105 mg/dL (ref 0.0–149.0)
VLDL: 21 mg/dL (ref 0.0–40.0)

## 2019-06-19 LAB — IBC + FERRITIN
Ferritin: 12.6 ng/mL — ABNORMAL LOW (ref 22.0–322.0)
Iron: 69 ug/dL (ref 42–165)
Saturation Ratios: 17.9 % — ABNORMAL LOW (ref 20.0–50.0)
Transferrin: 275 mg/dL (ref 212.0–360.0)

## 2019-06-19 LAB — PSA: PSA: 0.09 ng/mL — ABNORMAL LOW (ref 0.10–4.00)

## 2019-06-19 LAB — TSH: TSH: 1 u[IU]/mL (ref 0.35–4.50)

## 2019-06-20 ENCOUNTER — Other Ambulatory Visit: Payer: Self-pay

## 2019-06-20 ENCOUNTER — Encounter: Payer: Self-pay | Admitting: Radiation Oncology

## 2019-06-20 ENCOUNTER — Ambulatory Visit
Admission: RE | Admit: 2019-06-20 | Discharge: 2019-06-20 | Disposition: A | Discharge status: Home / Self Care | Payer: Medicare Other | Source: Ambulatory Visit | Attending: Radiation Oncology | Admitting: Radiation Oncology

## 2019-06-20 VITALS — BP 136/70 | HR 63 | Temp 98.9°F | Resp 18 | Ht 69.0 in | Wt 207.2 lb

## 2019-06-20 DIAGNOSIS — C3411 Malignant neoplasm of upper lobe, right bronchus or lung: Secondary | ICD-10-CM

## 2019-06-20 DIAGNOSIS — Z08 Encounter for follow-up examination after completed treatment for malignant neoplasm: Secondary | ICD-10-CM | POA: Diagnosis not present

## 2019-06-20 DIAGNOSIS — D508 Other iron deficiency anemias: Secondary | ICD-10-CM

## 2019-06-20 DIAGNOSIS — Z1211 Encounter for screening for malignant neoplasm of colon: Secondary | ICD-10-CM

## 2019-06-20 NOTE — Progress Notes (Signed)
Radiation Oncology         [phone] ________________________________  Name: Jerry [name] MRN: [id number]  Date: 06/20/2019  DOB: 09/14/1936  Follow-Up Visit Note  CC: Marin Olp, MD  Marin Olp, MD    ICD-10-CM   1. Cancer of upper lobe of right lung (HCC)  C34.11 CT Chest Wo Contrast    Diagnosis:   83 y.o. male with Clinical Stage IA adenocarcinoma of the right upper lobe  Interval Since Last Radiation:  2 years 1 month   05/01/17-05/09/17: Right lung / 54 Gy in 3 fractions  Prostate cancer with radioactive seed placement?  Narrative:  The patient returns today for routine follow-up.  His last chest CT scan, dated 06/05/19, showed post treatment scarring in the right upper lobe. No evidence of recurrence, metastatic disease. Scattered pulmonary nodules are stable.  On review of systems, the patient reports that he is doing well overall. He denies any issues or complaints at this time.  ALLERGIES:  is allergic to namenda [memantine].  Meds: Current Outpatient Medications  Medication Sig Dispense Refill  . amLODipine-olmesartan (AZOR) 5-40 MG tablet TAKE 1 TABLET BY MOUTH DAILY 90 tablet 1  . atorvastatin (LIPITOR) 40 MG tablet TAKE 1 TABLET(40 MG) BY MOUTH DAILY 90 tablet 1  . donepezil (ARICEPT) 10 MG tablet TAKE 1 TABLET BY MOUTH DAILY 90 tablet 1   No current facility-administered medications for this encounter.     Physical Findings: The patient is in no acute distress. Patient is alert and oriented.  height is 5\' 9"  (1.753 m) and weight is 207 lb 4 oz (94 kg). His temporal temperature is 98.9 F (37.2 C). His blood pressure is 136/70 and his pulse is 63. His respiration is 18 and oxygen saturation is 99%.   Lungs are clear to auscultation bilaterally. Heart has regular rate and rhythm. No palpable cervical, supraclavicular, or axillary adenopathy. Abdomen soft, non-tender, normal bowel sounds.  Lab Findings: Lab Results  Component Value Date    WBC 7.2 06/18/2019   HGB 12.2 (L) 06/18/2019   HCT 36.8 (L) 06/18/2019   MCV 90.3 06/18/2019   PLT 177.0 06/18/2019    Radiographic Findings: Ct Chest Wo Contrast  Result Date: 06/05/2019 CLINICAL DATA:  Lung cancer. EXAM: CT CHEST WITHOUT CONTRAST TECHNIQUE: Multidetector CT imaging of the chest was performed following the standard protocol without IV contrast. COMPARISON:  04/18/2018. FINDINGS: Cardiovascular: Atherosclerotic calcification of the aorta, aortic valve and coronary arteries. Heart size normal. No pericardial effusion. Mediastinum/Nodes: No pathologically enlarged mediastinal or axillary lymph nodes. Hilar regions are difficult to evaluate without IV contrast. Esophagus is grossly unremarkable. Lungs/Pleura: Centrilobular emphysema. Post treatment scarring and volume loss in the right upper lobe. Scattered peripheral and subpleural nodules measure up to 7 mm, as before. No pleural fluid. Debris is seen dependently in the trachea. Upper Abdomen: Visualized portions of the liver, gallbladder and right kidney are unremarkable. Small stones in the left kidney. Spleen and visualized portions of the pancreas, stomach and bowel are grossly unremarkable. No upper abdominal adenopathy. Musculoskeletal: Degenerative changes in the spine. No worrisome lytic or sclerotic lesions. IMPRESSION: 1. Post treatment scarring in the right upper lobe. No evidence of metastatic disease. 2. Scattered pulmonary nodules are stable. 3. Left renal stones. 4. Aortic atherosclerosis (ICD10-170.0). Coronary artery calcification. 5.  Emphysema (ICD10-J43.9). Electronically Signed   By: Lorin Picket M.D.   On: 06/05/2019 12:50    Impression:  Clinical Stage IA adenocarcinoma of the right upper  lobe. No evidence of recurrence on physical exam or recent chest CT scan. The daughter was inquiring about follow-up for the patient's prostate cancer, and he recently had a good PSA result (0.09) on blood work.   Plan:   The patient will be scheduled for chest CT scan in 6 months and follow up soon afterwards.  ____________________________________  Blair Promise, PhD, MD  This document serves as a record of services personally performed by Gery Pray, MD. It was created on his behalf by Rae Lips, a trained medical scribe. The creation of this record is based on the scribe's personal observations and the provider's statements to them. This document has been checked and approved by the attending provider.

## 2019-06-20 NOTE — Patient Instructions (Signed)
Coronavirus (COVID-19) Are you at risk?  Are you at risk for the Coronavirus (COVID-19)?  To be considered HIGH RISK for Coronavirus (COVID-19), you have to meet the following criteria:  . Traveled to [place], [place], [place], [place] or [place]; or in the [place] to [place], [place], [place], or [place]; and have fever, cough, and shortness of breath within the last 2 weeks of travel OR . Been in close contact with a person diagnosed with COVID-19 within the last 2 weeks and have fever, cough, and shortness of breath . IF YOU DO NOT MEET THESE CRITERIA, YOU ARE CONSIDERED LOW RISK FOR COVID-19.  What to do if you are HIGH RISK for COVID-19?  . If you are having a medical emergency, call 911. . Seek medical care right away. Before you go to a doctor's office, urgent care or emergency department, call ahead and tell them about your recent travel, contact with someone diagnosed with COVID-19, and your symptoms. You should receive instructions from your physician's office regarding next steps of care.  . When you arrive at healthcare provider, tell the healthcare staff immediately you have returned from visiting [place], [place], [place], [place] or [place]; or traveled in the [place] to [place], [place], [place], or [place]; in the last two weeks or you have been in close contact with a person diagnosed with COVID-19 in the last 2 weeks.   . Tell the health care staff about your symptoms: fever, cough and shortness of breath. . After you have been seen by a medical provider, you will be either: o Tested for (COVID-19) and discharged home on quarantine except to seek medical care if symptoms worsen, and asked to  - Stay home and avoid contact with others until you get your results (4-5 days)  - Avoid travel on public transportation if possible (such as bus, train, or airplane) or o Sent to the Emergency Department by EMS for evaluation, COVID-19 testing, and possible  admission depending on your condition and test results.  What to do if you are LOW RISK for COVID-19?  Reduce your risk of any infection by using the same precautions used for avoiding the common cold or flu:  . Wash your hands often with soap and warm water for at least 20 seconds.  If soap and water are not readily available, use an alcohol-based hand sanitizer with at least 60% alcohol.  . If coughing or sneezing, cover your mouth and nose by coughing or sneezing into the elbow areas of your shirt or coat, into a tissue or into your sleeve (not your hands). . Avoid shaking hands with others and consider head nods or verbal greetings only. . Avoid touching your eyes, nose, or mouth with unwashed hands.  . Avoid close contact with people who are sick. . Avoid places or events with large numbers of people in one location, like concerts or sporting events. . Carefully consider travel plans you have or are making. . If you are planning any travel outside or inside the US, visit the CDC's Travelers' Health webpage for the latest health notices. . If you have some symptoms but not all symptoms, continue to monitor at home and seek medical attention if your symptoms worsen. . If you are having a medical emergency, call 911.   ADDITIONAL HEALTHCARE OPTIONS FOR PATIENTS  [facility] / e-Visit: [email or web address]         [facility]: [phone]  [facility]: [phone]                   [facility]: [phone]   

## 2019-06-20 NOTE — Progress Notes (Signed)
Patient in for follow up and CT results. Denies any issues or complaints of at this time.BP 136/70 (BP Location: Left Arm, Patient Position: Sitting)   Pulse 63   Temp 98.9 F (37.2 C) (Temporal)   Resp 18   Ht 5\' 9"  (1.753 m)   Wt 207 lb 4 oz (94 kg)   SpO2 99%   BMI 30.61 kg/m

## 2019-06-23 ENCOUNTER — Encounter: Payer: Self-pay | Admitting: Family Medicine

## 2019-07-08 ENCOUNTER — Telehealth: Payer: Self-pay | Admitting: General Surgery

## 2019-07-08 NOTE — Telephone Encounter (Signed)
Contacted the patients home number listed which happens to be his daughters cell phone. She was unable to go through any medical information because she was out. She would like [place] to try back later today. She is aware of her fathers appointment scheduled for tomorrow via telemed.

## 2019-07-09 ENCOUNTER — Encounter: Payer: Self-pay | Admitting: Internal Medicine

## 2019-07-09 ENCOUNTER — Ambulatory Visit (INDEPENDENT_AMBULATORY_CARE_PROVIDER_SITE_OTHER): Payer: Medicare Other | Admitting: Internal Medicine

## 2019-07-09 VITALS — Ht 67.0 in | Wt 207.0 lb

## 2019-07-09 DIAGNOSIS — K5901 Slow transit constipation: Secondary | ICD-10-CM | POA: Diagnosis not present

## 2019-07-09 DIAGNOSIS — D508 Other iron deficiency anemias: Secondary | ICD-10-CM

## 2019-07-11 ENCOUNTER — Encounter: Payer: Self-pay | Admitting: Internal Medicine

## 2019-07-11 NOTE — Progress Notes (Signed)
HISTORY OF PRESENT ILLNESS:  Jerry [name] is a 83 y.o. male with a history of prostate cancer and low stage lung cancer as well as significant dementia who was referred by his primary provider Dr. Yong Channel regarding iron deficiency anemia.  The patient is unable to provide history but his daughter Cristopher Estimable provides all of the historical information.  She has recently moved from [place] to [place] to assist in the care for her father.  The patient underwent his annual physical evaluation June 18, 2019.  No particular complaints at that time.  Blood work revealed very mild anemia with hemoglobin 12.2.  Normal MCV at 90.3.  Subsequent iron studies suggested iron deficiency with ferritin 12.6.  Hemoglobin however has been stable over the past 2 years.  Hemoglobin 2 years ago was 12.5 with MCV 89.7.  Patient was placed on an iron pill which resulted in constipation.  For this he takes magnesium citrate sparingly.  He is not independent.  He wears protective undergarments.  Did have a CT scan of the abdomen and pelvis February 2018 which revealed no particular abdominal abnormalities.  REVIEW OF SYSTEMS:  All non-GI ROS negative unless otherwise stated in the HPI except for memory deficit  Past Medical History:  Diagnosis Date  . Cancer of prostate ([place]) 2001  . Chronic kidney disease (CKD), stage III (moderate) ([place])    [name] 02/08/2017  . Dementia ([place])   . DIVERTICULOSIS, COLON 07/25/2007   Qualifier: Diagnosis of  By: [name], LPN, [name]   . High cholesterol   . History of radiation therapy 05/01/17-05/09/17   right lung 54 Gy in 3 fractions  . Hypertension   . Lung cancer ([place]) 03/2017    Past Surgical History:  Procedure Laterality Date  . DIRECT LARYNGOSCOPY N/A 06/21/2017   Procedure: DIRECT LARYNGOSCOPY;  Surgeon: Melissa Montane, MD;  Location: [facility];  Service: ENT;  Laterality: N/A;  . EYE SURGERY    . INSERTION PROSTATE RADIATION SEED  07/2000   [name] 05/09/2011  . PENILE  PROSTHESIS IMPLANT  02/2002   Insertion of Mentor three piece inflatable penile prosthesis./notes 05/09/2011  . PENILE PROSTHESIS PLACEMENT  06/2003   Placement of Mentor rod prosthesis/notes 05/09/2011  . REFRACTIVE SURGERY Bilateral   . REMOVAL OF PENILE PROSTHESIS  06/2003   Removal of three-piece Mentor prosthesis/notes 05/09/2011  . SCROTAL SURGERY  06/2003   Repair of scrotal defect/notes 05/09/2011    Social History SEARCY MIYOSHI  reports that he quit smoking about 30 years ago. He smoked 0.50 packs per day. He has never used smokeless tobacco. He reports that he does not drink alcohol or use drugs.  family history includes Alcoholism in his father; Breast cancer in his sister; Cirrhosis in an other family member; Early death in his father.  Allergies  Allergen Reactions  . Namenda [Memantine] Other (See Comments)    Made pt feel dizzy       PHYSICAL EXAMINATION: No physical exam with telehealth medicine visit  ASSESSMENT:  1.  Mild stable anemia with studies suggesting iron deficiency. 2.  Advanced dementia 3.  Iron related constipation   PLAN:  1.  Stop iron 2.  I discussed the differential diagnosis of iron deficiency with regards to GI pathology.  Worst-case scenario being colon cancer.  We discussed work-up strategies including endoscopic procedures.  After lengthy discussion and in consideration of patient's advanced dementia no further work-up was elected.  I completely support this decision.  He will return to  the care of Dr. Yong Channel.  GI follow-up as needed. This telehealth audio only visit was initiated by the patient's daughter (power of attorney) and consented for by her.  She was with her father in his home and I was in my office.  They understand there may be an associated professional charge for this comprehensive service which totaled 30 minutes

## 2019-07-20 ENCOUNTER — Other Ambulatory Visit: Payer: Self-pay | Admitting: Family Medicine

## 2019-07-20 DIAGNOSIS — E785 Hyperlipidemia, unspecified: Secondary | ICD-10-CM

## 2019-08-15 ENCOUNTER — Other Ambulatory Visit: Payer: Self-pay | Admitting: Family Medicine

## 2019-08-15 DIAGNOSIS — E785 Hyperlipidemia, unspecified: Secondary | ICD-10-CM

## 2019-09-06 ENCOUNTER — Telehealth: Payer: Self-pay | Admitting: Family Medicine

## 2019-09-06 DIAGNOSIS — F03B0 Unspecified dementia, moderate, without behavioral disturbance, psychotic disturbance, mood disturbance, and anxiety: Secondary | ICD-10-CM

## 2019-09-06 DIAGNOSIS — F0390 Unspecified dementia without behavioral disturbance: Secondary | ICD-10-CM

## 2019-09-06 NOTE — Telephone Encounter (Signed)
Okay for orders? Please advise 

## 2019-09-06 NOTE — Telephone Encounter (Signed)
See below

## 2019-09-06 NOTE — Telephone Encounter (Signed)
Pt daughter Cristopher Estimable is calling and would like to resume skilled nursing, OT and PT for her dad. Pt has dementia . Pt has use [facility] in the past and will use any agency covered on his insurance [employer] medicare

## 2019-09-07 NOTE — Telephone Encounter (Signed)
I do not mind restarting these- you may place orders- please clarify with him if patient needs updated face-to-face visit (virtual okay) to qualify as last visit was in June

## 2019-09-09 NOTE — Telephone Encounter (Signed)
Referral/order placed to [facility] for PT, OT, and skilled nursing.

## 2019-09-09 NOTE — Addendum Note (Signed)
Addended by: Marin Olp on: 09/09/2019 01:26 PM   Modules accepted: Orders

## 2019-09-09 NOTE — Addendum Note (Signed)
Addended by: Marin Olp on: 09/09/2019 01:25 PM   Modules accepted: Orders

## 2019-09-09 NOTE — Telephone Encounter (Signed)
Called Jerry [name] and advised. Will call back if pt needs face-to-face with Dr. Yong Channel prior to starting [facility] services.

## 2019-09-12 ENCOUNTER — Encounter: Payer: Self-pay | Admitting: Family Medicine

## 2019-09-12 ENCOUNTER — Ambulatory Visit (INDEPENDENT_AMBULATORY_CARE_PROVIDER_SITE_OTHER): Payer: Medicare Other | Admitting: Family Medicine

## 2019-09-12 ENCOUNTER — Telehealth: Payer: Self-pay | Admitting: Family Medicine

## 2019-09-12 VITALS — Temp 98.2°F | Ht 67.0 in

## 2019-09-12 DIAGNOSIS — E785 Hyperlipidemia, unspecified: Secondary | ICD-10-CM | POA: Diagnosis not present

## 2019-09-12 DIAGNOSIS — I10 Essential (primary) hypertension: Secondary | ICD-10-CM | POA: Diagnosis not present

## 2019-09-12 DIAGNOSIS — Z85118 Personal history of other malignant neoplasm of bronchus and lung: Secondary | ICD-10-CM

## 2019-09-12 DIAGNOSIS — F0390 Unspecified dementia without behavioral disturbance: Secondary | ICD-10-CM

## 2019-09-12 DIAGNOSIS — F03B0 Unspecified dementia, moderate, without behavioral disturbance, psychotic disturbance, mood disturbance, and anxiety: Secondary | ICD-10-CM

## 2019-09-12 NOTE — Progress Notes (Addendum)
Phone [phone]   Subjective:  Virtual visit via Video note. Chief complaint: Chief Complaint  Patient presents with  . Follow-up  . Dementia    This visit type was conducted due to national recommendations for restrictions regarding the COVID-19 Pandemic (e.g. social distancing).  This format is felt to be most appropriate for this patient at this time balancing risks to patient and risks to population by having him in for in person visit.  No physical exam was performed (except for noted visual exam or audio findings with Telehealth visits).    Our team/I connected with [name] at  4:00 PM EDT by a video enabled telemedicine application (doxy.me or caregility through epic) and verified that I am speaking with the correct person using two identifiers.  Location patient: [facility] Location provider: [facility], office Persons participating in the virtual visit:  patient  Our team/I discussed the limitations of evaluation and management by telemedicine and the availability of in person appointments. In light of current covid-19 pandemic, patient also understands that we are trying to protect them by minimizing in office contact if at all possible.  The patient expressed consent for telemedicine visit and agreed to proceed. Patient understands insurance will be billed.   ROS- level 5 caveat applies due to dementia- no reported fever/cough/chills/shortness of rbeath   Past Medical History-  Patient Active Problem List   Diagnosis Date Noted  . History of lung cancer 09/18/2017    Priority: High  . Moderate dementia without behavioral disturbance ([place]) 07/17/2015    Priority: High  . Hyperlipidemia 10/15/2014    Priority: Medium  . Gout of big toe 08/04/2008    Priority: Medium  . Essential hypertension 07/25/2007    Priority: Medium  . PROSTATE CANCER, HX OF 07/25/2007    Priority: Medium  . Aortic atherosclerosis ([place]) 01/06/2018    Priority: Low  . Pulmonary  emphysema ([place])     Priority: Low  . Allergic rhinitis 10/15/2014    Priority: Low  . Overactive bladder 10/15/2014    Priority: Low  . CARDIAC MURMUR 01/10/2011    Priority: Low  . ORGANIC IMPOTENCE 08/12/2010    Priority: Low  . Exertional dyspnea 03/15/2017  . Parotiditis 02/08/2017  . Facial swelling 02/08/2017  . Lung nodule 02/08/2017  . Submandibular space infection     Medications- reviewed and updated Current Outpatient Medications  Medication Sig Dispense Refill  . amLODipine-olmesartan (AZOR) 5-40 MG tablet TAKE 1 TABLET BY MOUTH ONCE DAILY 90 tablet 1  . atorvastatin (LIPITOR) 40 MG tablet TAKE 1 TABLET BY MOUTH DAILY 90 tablet 1  . donepezil (ARICEPT) 10 MG tablet TAKE 1 TABLET BY MOUTH DAILY 90 tablet 1  . ferrous sulfate 325 (65 FE) MG tablet Take 325 mg by mouth daily. Patient taking three times a week     No current facility-administered medications for this visit.      Objective:  Temp 98.2 F (36.8 C)   Ht 5\' 7"  (5.400 m)   BMI 32.42 kg/m  self reported vitals Gen: NAD, resting comfortably Lungs: nonlabored, normal respiratory rate but now Skin: appears dry, no obvious rash "punched in the computer here    Assessment and Plan   #daughter Cristopher Estimable in town from [place] since June- her son in virtual school and she is also in school (has to be back in [place] in person). He tried to stay in [place] for 30 days but he just wanted to be back home. Sister not willing  to do 24 hour care-precovid would consider facility. Cristopher Estimable Will be gone for a few weeks in October and they will look for ways to have him cared for. Other local sister not willing to move in long term but may be able to help short term.   #flu shot planned at pharmacy- if [facility] should pull into our system  #hypertension S: controlled on amlodipine- olmesartan 5-40mg  BP Readings from Last 3 Encounters:  06/20/19 136/70  06/18/19 138/64  03/05/19 132/60  A/P: Stable/controlled  previously- but advised home monitoring with omron 3 likely.  Continue current medications.    # Dementia S: Memory issues seem to have progressed per daughter. Lack of social interaction seems to be pushing him toward decline in memory- family feels like he needs more services at this point (and I agree)- he also seems less mobile and they are just not sure what he is capable of. Need help setting up ways for patient to remember meds like pill boxes and want nurse involvement. He may need help with meal prep- asking about potential home health aide A/P: will refer to home health. Face to face visit today. Memory issues worsening- will continue aricept. Has not tolerated namenda. Next in person visit try to update MMSE.   # iron deficiency anemia S:  No further workup after discussions with GI- he is taking iron over the counter 2-3x a weeek. A/P: I wonder if this could contribute to worsening mobility issus- anemia- will plan to check iron levels and cbc at next visit in person   #lung cancer history- radiation a few years ago. Scan 05/2019 largely reassuring   recommended follow up: has November visit with me planned Future Appointments  Date Time Provider [place]  09/12/2019  4:00 PM Marin Olp, MD [facility]  11/18/2019 11:00 AM Marin Olp, MD [facility]  12/16/2019  3:00 PM Gery Pray, MD [facility] None   Lab/Order associations:   ICD-10-CM   1. Essential hypertension  I10   2. History of lung cancer  Z85.118   3. Moderate dementia without behavioral disturbance (HCC)  F03.90   4. Hyperlipidemia, unspecified hyperlipidemia type  E78.5    Return precautions advised.  Garret Reddish, MD

## 2019-09-12 NOTE — Patient Instructions (Addendum)
Health Maintenance Due  Topic Date Due  . INFLUENZA VACCINE  07/27/2019   Depression screen [facility] 2/9 11/07/2018 01/11/2018 09/18/2017  Decreased Interest 0 0 0  Down, Depressed, Hopeless 0 0 0  PHQ - 2 Score 0 0 0  # HM- plans to get flu shot at [employer]

## 2019-09-12 NOTE — Telephone Encounter (Signed)
Left a vm for him to call our office (no details given) -- He has a referral in for Home Health - per [facility], he needs a virtual appt since it has been awhile since Dr Yong Channel has seen him.  Dr Yong Channel ok'd a virtual appt for this afternoon at 440 (tell the patient it may be 5 or 515 before he gets a call ) or you can use a same day slot that he has next week ( I think there were a few Tues or Wed )  Brittney is aware and said that would be fine as long as Dr Yong Channel ok'd it.

## 2019-09-26 ENCOUNTER — Other Ambulatory Visit: Payer: Self-pay

## 2019-09-26 ENCOUNTER — Telehealth: Payer: Self-pay | Admitting: Family Medicine

## 2019-09-26 MED ORDER — AMLODIPINE-OLMESARTAN 5-40 MG PO TABS: 1.0000 | ORAL_TABLET | Freq: Every day | ORAL | 0 refills | Status: DC

## 2019-09-26 NOTE — Telephone Encounter (Signed)
Daughter Willette Pa states pt has lost/misplaced his  amLODipine-olmesartan (AZOR) 5-40 MG tablet. She states he will need a new Rx sent in for 2 mo, until she can get the other one refilled.  Pt states pt is dealing with dementia, and he does not know what happened to the Rx.  Please send to  [facility], [address] AT [address] [phone] (Phone) [phone] (Fax)

## 2019-09-26 NOTE — Telephone Encounter (Signed)
2 month supply of Azor sent in.

## 2019-09-27 DIAGNOSIS — Z85118 Personal history of other malignant neoplasm of bronchus and lung: Secondary | ICD-10-CM | POA: Diagnosis not present

## 2019-09-27 DIAGNOSIS — I129 Hypertensive chronic kidney disease with stage 1 through stage 4 chronic kidney disease, or unspecified chronic kidney disease: Secondary | ICD-10-CM | POA: Diagnosis not present

## 2019-09-27 DIAGNOSIS — R911 Solitary pulmonary nodule: Secondary | ICD-10-CM | POA: Diagnosis not present

## 2019-09-27 DIAGNOSIS — J309 Allergic rhinitis, unspecified: Secondary | ICD-10-CM | POA: Diagnosis not present

## 2019-09-27 DIAGNOSIS — G309 Alzheimer's disease, unspecified: Secondary | ICD-10-CM | POA: Diagnosis not present

## 2019-09-27 DIAGNOSIS — M10079 Idiopathic gout, unspecified ankle and foot: Secondary | ICD-10-CM | POA: Diagnosis not present

## 2019-09-27 DIAGNOSIS — Z9181 History of falling: Secondary | ICD-10-CM | POA: Diagnosis not present

## 2019-09-27 DIAGNOSIS — J439 Emphysema, unspecified: Secondary | ICD-10-CM | POA: Diagnosis not present

## 2019-09-27 DIAGNOSIS — E785 Hyperlipidemia, unspecified: Secondary | ICD-10-CM | POA: Diagnosis not present

## 2019-09-27 DIAGNOSIS — Z87891 Personal history of nicotine dependence: Secondary | ICD-10-CM | POA: Diagnosis not present

## 2019-09-27 DIAGNOSIS — N3281 Overactive bladder: Secondary | ICD-10-CM | POA: Diagnosis not present

## 2019-09-27 DIAGNOSIS — N183 Chronic kidney disease, stage 3 unspecified: Secondary | ICD-10-CM | POA: Diagnosis not present

## 2019-09-28 ENCOUNTER — Other Ambulatory Visit: Payer: Self-pay | Admitting: Family Medicine

## 2019-09-28 DIAGNOSIS — Z9181 History of falling: Secondary | ICD-10-CM | POA: Diagnosis not present

## 2019-09-28 DIAGNOSIS — G309 Alzheimer's disease, unspecified: Secondary | ICD-10-CM | POA: Diagnosis not present

## 2019-09-28 DIAGNOSIS — N3281 Overactive bladder: Secondary | ICD-10-CM | POA: Diagnosis not present

## 2019-09-28 DIAGNOSIS — R911 Solitary pulmonary nodule: Secondary | ICD-10-CM | POA: Diagnosis not present

## 2019-09-28 DIAGNOSIS — N183 Chronic kidney disease, stage 3 unspecified: Secondary | ICD-10-CM | POA: Diagnosis not present

## 2019-09-28 DIAGNOSIS — I129 Hypertensive chronic kidney disease with stage 1 through stage 4 chronic kidney disease, or unspecified chronic kidney disease: Secondary | ICD-10-CM | POA: Diagnosis not present

## 2019-09-28 DIAGNOSIS — J309 Allergic rhinitis, unspecified: Secondary | ICD-10-CM | POA: Diagnosis not present

## 2019-09-28 DIAGNOSIS — Z87891 Personal history of nicotine dependence: Secondary | ICD-10-CM | POA: Diagnosis not present

## 2019-09-28 DIAGNOSIS — M10079 Idiopathic gout, unspecified ankle and foot: Secondary | ICD-10-CM | POA: Diagnosis not present

## 2019-09-28 DIAGNOSIS — Z85118 Personal history of other malignant neoplasm of bronchus and lung: Secondary | ICD-10-CM | POA: Diagnosis not present

## 2019-09-28 DIAGNOSIS — E785 Hyperlipidemia, unspecified: Secondary | ICD-10-CM | POA: Diagnosis not present

## 2019-09-28 DIAGNOSIS — J439 Emphysema, unspecified: Secondary | ICD-10-CM | POA: Diagnosis not present

## 2019-09-30 DIAGNOSIS — E785 Hyperlipidemia, unspecified: Secondary | ICD-10-CM | POA: Diagnosis not present

## 2019-09-30 DIAGNOSIS — J439 Emphysema, unspecified: Secondary | ICD-10-CM | POA: Diagnosis not present

## 2019-09-30 DIAGNOSIS — J309 Allergic rhinitis, unspecified: Secondary | ICD-10-CM | POA: Diagnosis not present

## 2019-09-30 DIAGNOSIS — R911 Solitary pulmonary nodule: Secondary | ICD-10-CM | POA: Diagnosis not present

## 2019-09-30 DIAGNOSIS — M10079 Idiopathic gout, unspecified ankle and foot: Secondary | ICD-10-CM | POA: Diagnosis not present

## 2019-09-30 DIAGNOSIS — N3281 Overactive bladder: Secondary | ICD-10-CM | POA: Diagnosis not present

## 2019-09-30 DIAGNOSIS — Z87891 Personal history of nicotine dependence: Secondary | ICD-10-CM | POA: Diagnosis not present

## 2019-09-30 DIAGNOSIS — G309 Alzheimer's disease, unspecified: Secondary | ICD-10-CM | POA: Diagnosis not present

## 2019-09-30 DIAGNOSIS — Z85118 Personal history of other malignant neoplasm of bronchus and lung: Secondary | ICD-10-CM | POA: Diagnosis not present

## 2019-09-30 DIAGNOSIS — N183 Chronic kidney disease, stage 3 unspecified: Secondary | ICD-10-CM | POA: Diagnosis not present

## 2019-09-30 DIAGNOSIS — I129 Hypertensive chronic kidney disease with stage 1 through stage 4 chronic kidney disease, or unspecified chronic kidney disease: Secondary | ICD-10-CM | POA: Diagnosis not present

## 2019-09-30 DIAGNOSIS — Z9181 History of falling: Secondary | ICD-10-CM | POA: Diagnosis not present

## 2019-10-03 DIAGNOSIS — J309 Allergic rhinitis, unspecified: Secondary | ICD-10-CM | POA: Diagnosis not present

## 2019-10-03 DIAGNOSIS — N3281 Overactive bladder: Secondary | ICD-10-CM | POA: Diagnosis not present

## 2019-10-03 DIAGNOSIS — R911 Solitary pulmonary nodule: Secondary | ICD-10-CM | POA: Diagnosis not present

## 2019-10-03 DIAGNOSIS — N183 Chronic kidney disease, stage 3 unspecified: Secondary | ICD-10-CM | POA: Diagnosis not present

## 2019-10-03 DIAGNOSIS — Z85118 Personal history of other malignant neoplasm of bronchus and lung: Secondary | ICD-10-CM | POA: Diagnosis not present

## 2019-10-03 DIAGNOSIS — Z9181 History of falling: Secondary | ICD-10-CM | POA: Diagnosis not present

## 2019-10-03 DIAGNOSIS — E785 Hyperlipidemia, unspecified: Secondary | ICD-10-CM | POA: Diagnosis not present

## 2019-10-03 DIAGNOSIS — G309 Alzheimer's disease, unspecified: Secondary | ICD-10-CM | POA: Diagnosis not present

## 2019-10-03 DIAGNOSIS — M10079 Idiopathic gout, unspecified ankle and foot: Secondary | ICD-10-CM | POA: Diagnosis not present

## 2019-10-03 DIAGNOSIS — I129 Hypertensive chronic kidney disease with stage 1 through stage 4 chronic kidney disease, or unspecified chronic kidney disease: Secondary | ICD-10-CM | POA: Diagnosis not present

## 2019-10-03 DIAGNOSIS — J439 Emphysema, unspecified: Secondary | ICD-10-CM | POA: Diagnosis not present

## 2019-10-03 DIAGNOSIS — Z87891 Personal history of nicotine dependence: Secondary | ICD-10-CM | POA: Diagnosis not present

## 2019-10-10 ENCOUNTER — Telehealth: Payer: Self-pay | Admitting: Family Medicine

## 2019-10-10 DIAGNOSIS — J309 Allergic rhinitis, unspecified: Secondary | ICD-10-CM | POA: Diagnosis not present

## 2019-10-10 DIAGNOSIS — R911 Solitary pulmonary nodule: Secondary | ICD-10-CM | POA: Diagnosis not present

## 2019-10-10 DIAGNOSIS — I129 Hypertensive chronic kidney disease with stage 1 through stage 4 chronic kidney disease, or unspecified chronic kidney disease: Secondary | ICD-10-CM | POA: Diagnosis not present

## 2019-10-10 DIAGNOSIS — G309 Alzheimer's disease, unspecified: Secondary | ICD-10-CM | POA: Diagnosis not present

## 2019-10-10 DIAGNOSIS — Z9181 History of falling: Secondary | ICD-10-CM | POA: Diagnosis not present

## 2019-10-10 DIAGNOSIS — N183 Chronic kidney disease, stage 3 unspecified: Secondary | ICD-10-CM | POA: Diagnosis not present

## 2019-10-10 DIAGNOSIS — N3281 Overactive bladder: Secondary | ICD-10-CM | POA: Diagnosis not present

## 2019-10-10 DIAGNOSIS — Z87891 Personal history of nicotine dependence: Secondary | ICD-10-CM | POA: Diagnosis not present

## 2019-10-10 DIAGNOSIS — J439 Emphysema, unspecified: Secondary | ICD-10-CM | POA: Diagnosis not present

## 2019-10-10 DIAGNOSIS — M10079 Idiopathic gout, unspecified ankle and foot: Secondary | ICD-10-CM | POA: Diagnosis not present

## 2019-10-10 DIAGNOSIS — E785 Hyperlipidemia, unspecified: Secondary | ICD-10-CM | POA: Diagnosis not present

## 2019-10-10 DIAGNOSIS — Z85118 Personal history of other malignant neoplasm of bronchus and lung: Secondary | ICD-10-CM | POA: Diagnosis not present

## 2019-10-10 NOTE — Telephone Encounter (Signed)
[facility]te from [facility] requesting verbal orders for a speech therapy evaluation    Call back [phone]

## 2019-10-10 NOTE — Telephone Encounter (Signed)
See note

## 2019-10-14 NOTE — Telephone Encounter (Signed)
Called and gave VO to Nicollette.

## 2019-10-16 DIAGNOSIS — Z87891 Personal history of nicotine dependence: Secondary | ICD-10-CM | POA: Diagnosis not present

## 2019-10-16 DIAGNOSIS — N183 Chronic kidney disease, stage 3 unspecified: Secondary | ICD-10-CM | POA: Diagnosis not present

## 2019-10-16 DIAGNOSIS — Z9181 History of falling: Secondary | ICD-10-CM | POA: Diagnosis not present

## 2019-10-16 DIAGNOSIS — J439 Emphysema, unspecified: Secondary | ICD-10-CM | POA: Diagnosis not present

## 2019-10-16 DIAGNOSIS — Z85118 Personal history of other malignant neoplasm of bronchus and lung: Secondary | ICD-10-CM | POA: Diagnosis not present

## 2019-10-16 DIAGNOSIS — R911 Solitary pulmonary nodule: Secondary | ICD-10-CM | POA: Diagnosis not present

## 2019-10-16 DIAGNOSIS — E785 Hyperlipidemia, unspecified: Secondary | ICD-10-CM | POA: Diagnosis not present

## 2019-10-16 DIAGNOSIS — G309 Alzheimer's disease, unspecified: Secondary | ICD-10-CM | POA: Diagnosis not present

## 2019-10-16 DIAGNOSIS — J309 Allergic rhinitis, unspecified: Secondary | ICD-10-CM | POA: Diagnosis not present

## 2019-10-16 DIAGNOSIS — M10079 Idiopathic gout, unspecified ankle and foot: Secondary | ICD-10-CM | POA: Diagnosis not present

## 2019-10-16 DIAGNOSIS — I129 Hypertensive chronic kidney disease with stage 1 through stage 4 chronic kidney disease, or unspecified chronic kidney disease: Secondary | ICD-10-CM | POA: Diagnosis not present

## 2019-10-16 DIAGNOSIS — N3281 Overactive bladder: Secondary | ICD-10-CM | POA: Diagnosis not present

## 2019-10-31 ENCOUNTER — Telehealth: Payer: Self-pay | Admitting: Family Medicine

## 2019-10-31 NOTE — Telephone Encounter (Signed)
I left a message asking the patient to call and schedule Medicare AWV with Loma Sousa ([place]) on 11/18/2019 after seeing Dr. Yong Channel.  Im waiting for a call back to either confirm or decline the appointment. If patient calls back, please update appointment notes.  VDM ([place]ee[place]Dee)

## 2019-11-14 ENCOUNTER — Other Ambulatory Visit: Payer: Self-pay

## 2019-11-14 DIAGNOSIS — R413 Other amnesia: Secondary | ICD-10-CM

## 2019-11-14 MED ORDER — DONEPEZIL HCL 10 MG PO TABS: ORAL_TABLET | ORAL | 1 refills | Status: DC

## 2019-11-18 ENCOUNTER — Encounter: Payer: Self-pay | Admitting: Family Medicine

## 2019-11-18 ENCOUNTER — Ambulatory Visit: Payer: Medicare Other

## 2019-11-18 ENCOUNTER — Ambulatory Visit: Payer: Medicare Other | Admitting: Family Medicine

## 2019-11-18 NOTE — Progress Notes (Deleted)
  Phone [phone] In person visit   Subjective:   Jerry [name] is a 83 y.o. year old very pleasant male patient who presents for/with See problem oriented charting No chief complaint on file.   ROS- ***   This visit occurred during the SARS-CoV-2 public health emergency.  Safety protocols were in place, including screening questions prior to the visit, additional usage of staff PPE, and extensive cleaning of exam room while observing appropriate contact time as indicated for disinfecting solutions.   Past Medical History-  Patient Active Problem List   Diagnosis Date Noted  . Aortic atherosclerosis ([place]) 01/06/2018  . History of lung cancer 09/18/2017  . Exertional dyspnea 03/15/2017  . Pulmonary emphysema ([place])   . Parotiditis 02/08/2017  . Facial swelling 02/08/2017  . Lung nodule 02/08/2017  . Submandibular space infection   . Moderate dementia without behavioral disturbance ([place]) 07/17/2015  . Hyperlipidemia 10/15/2014  . Allergic rhinitis 10/15/2014  . Overactive bladder 10/15/2014  . CARDIAC MURMUR 01/10/2011  . ORGANIC IMPOTENCE 08/12/2010  . Gout of big toe 08/04/2008  . Essential hypertension 07/25/2007  . PROSTATE CANCER, HX OF 07/25/2007    Medications- reviewed and updated Current Outpatient Medications  Medication Sig Dispense Refill  . amLODipine-olmesartan (AZOR) 5-40 MG tablet TAKE 1 TABLET BY MOUTH DAILY 90 tablet 1  . atorvastatin (LIPITOR) 40 MG tablet TAKE 1 TABLET BY MOUTH DAILY 90 tablet 1  . donepezil (ARICEPT) 10 MG tablet TAKE 1 TABLET BY MOUTH DAILY 90 tablet 1  . ferrous sulfate 325 (65 FE) MG tablet Take 325 mg by mouth daily. Patient taking three times a week     No current facility-administered medications for this visit.      Objective:  There were no vitals taken for this visit. Gen: NAD, resting comfortably CV: RRR no murmurs rubs or gallops Lungs: CTAB no crackles, wheeze, rhonchi Abdomen: soft/nontender/nondistended/normal  bowel sounds. No rebound or guarding.  Ext: no edema Skin: warm, dry Neuro: grossly normal, moves all extremities  ***    Assessment and Plan  #hypertension S: controlled on amlodipine- olmesartan 5-40mg  A/P:    # Dementia S:   Memory issues seem to have progressed per daughter. Lack of social interaction seems to be pushing him toward decline in memory- family feels like he needs more services at this point (and I agree)- he also seems less mobile and they are just not sure what he is capable of. Need help setting up ways for patient to remember meds like pill boxes and want nurse involvement. He may need help with meal prep- asking about potential home health aide A/P:   # iron deficiency anemia S:  No further workup after discussions with GI- he is taking iron over the counter 2-3x a weeek. A/P:    No specialty comments available.  No problem-specific Assessment & Plan notes found for this encounter.   Recommended follow up: ***No follow-ups on file. Future Appointments  Date Time Provider [place]  11/18/2019 11:00 AM Marin Olp, MD [facility]  11/18/2019 11:30 AM Bernita Raisin, LPN [facility]  [id number]  4:00 PM Gery Pray, MD [facility] None    Lab/Order associations: No diagnosis found.  No orders of the defined types were placed in this encounter.   Return precautions advised.  Francella Solian, CMA

## 2019-12-16 ENCOUNTER — Ambulatory Visit: Payer: Self-pay | Admitting: Radiation Oncology

## 2019-12-17 ENCOUNTER — Telehealth: Payer: Self-pay | Admitting: *Deleted

## 2019-12-17 NOTE — Telephone Encounter (Signed)
CALLED PATIENT TO ALTER FU APPT. ON 12-26-19 TO 1 PM PER JILL, RN FOR DR. KINARD, SPOKE WITH DAUGHTER [name] AND SHE AGREED TO NEW APPT. TIME ON 12-26-19

## 2019-12-23 ENCOUNTER — Encounter (HOSPITAL_COMMUNITY): Payer: Self-pay

## 2019-12-23 ENCOUNTER — Ambulatory Visit (HOSPITAL_COMMUNITY)
Admission: RE | Admit: 2019-12-23 | Discharge: 2019-12-23 | Disposition: A | Discharge status: Home / Self Care | Payer: Medicare Other | Source: Ambulatory Visit | Attending: Radiation Oncology | Admitting: Radiation Oncology

## 2019-12-23 ENCOUNTER — Other Ambulatory Visit: Payer: Self-pay

## 2019-12-23 DIAGNOSIS — C3411 Malignant neoplasm of upper lobe, right bronchus or lung: Secondary | ICD-10-CM | POA: Diagnosis not present

## 2019-12-26 ENCOUNTER — Encounter: Payer: Self-pay | Admitting: Radiation Oncology

## 2019-12-26 ENCOUNTER — Other Ambulatory Visit: Payer: Self-pay

## 2019-12-26 ENCOUNTER — Ambulatory Visit
Admission: RE | Admit: 2019-12-26 | Discharge: 2019-12-26 | Disposition: A | Discharge status: Home / Self Care | Payer: Medicare Other | Source: Ambulatory Visit | Attending: Radiation Oncology | Admitting: Radiation Oncology

## 2019-12-26 VITALS — BP 126/47 | HR 68 | Temp 98.7°F | Resp 20 | Wt 211.2 lb

## 2019-12-26 DIAGNOSIS — Z08 Encounter for follow-up examination after completed treatment for malignant neoplasm: Secondary | ICD-10-CM | POA: Diagnosis not present

## 2019-12-26 DIAGNOSIS — Z923 Personal history of irradiation: Secondary | ICD-10-CM | POA: Diagnosis not present

## 2019-12-26 DIAGNOSIS — Z85118 Personal history of other malignant neoplasm of bronchus and lung: Secondary | ICD-10-CM | POA: Insufficient documentation

## 2019-12-26 DIAGNOSIS — Z79899 Other long term (current) drug therapy: Secondary | ICD-10-CM | POA: Diagnosis not present

## 2019-12-26 DIAGNOSIS — I700 Atherosclerosis of aorta: Secondary | ICD-10-CM | POA: Diagnosis not present

## 2019-12-26 DIAGNOSIS — R911 Solitary pulmonary nodule: Secondary | ICD-10-CM

## 2019-12-26 NOTE — Patient Instructions (Signed)
Coronavirus (COVID-19) Are you at risk?  Are you at risk for the Coronavirus (COVID-19)?  To be considered HIGH RISK for Coronavirus (COVID-19), you have to meet the following criteria:  . Traveled to [place], [place], [place], [place] or [place]; or in the [place] to [place], [place], [place], or [place]; and have fever, cough, and shortness of breath within the last 2 weeks of travel OR . Been in close contact with a person diagnosed with COVID-19 within the last 2 weeks and have fever, cough, and shortness of breath . IF YOU DO NOT MEET THESE CRITERIA, YOU ARE CONSIDERED LOW RISK FOR COVID-19.  What to do if you are HIGH RISK for COVID-19?  . If you are having a medical emergency, call 911. . Seek medical care right away. Before you go to a doctor's office, urgent care or emergency department, call ahead and tell them about your recent travel, contact with someone diagnosed with COVID-19, and your symptoms. You should receive instructions from your physician's office regarding next steps of care.  . When you arrive at healthcare provider, tell the healthcare staff immediately you have returned from visiting [place], [place], [place], [place] or [place]; or traveled in the [place] to [place], [place], [place], or [place]; in the last two weeks or you have been in close contact with a person diagnosed with COVID-19 in the last 2 weeks.   . Tell the health care staff about your symptoms: fever, cough and shortness of breath. . After you have been seen by a medical provider, you will be either: o Tested for (COVID-19) and discharged home on quarantine except to seek medical care if symptoms worsen, and asked to  - Stay home and avoid contact with others until you get your results (4-5 days)  - Avoid travel on public transportation if possible (such as bus, train, or airplane) or o Sent to the Emergency Department by EMS for evaluation, COVID-19 testing, and possible  admission depending on your condition and test results.  What to do if you are LOW RISK for COVID-19?  Reduce your risk of any infection by using the same precautions used for avoiding the common cold or flu:  . Wash your hands often with soap and warm water for at least 20 seconds.  If soap and water are not readily available, use an alcohol-based hand sanitizer with at least 60% alcohol.  . If coughing or sneezing, cover your mouth and nose by coughing or sneezing into the elbow areas of your shirt or coat, into a tissue or into your sleeve (not your hands). . Avoid shaking hands with others and consider head nods or verbal greetings only. . Avoid touching your eyes, nose, or mouth with unwashed hands.  . Avoid close contact with people who are sick. . Avoid places or events with large numbers of people in one location, like concerts or sporting events. . Carefully consider travel plans you have or are making. . If you are planning any travel outside or inside the US, visit the CDC's Travelers' Health webpage for the latest health notices. . If you have some symptoms but not all symptoms, continue to monitor at home and seek medical attention if your symptoms worsen. . If you are having a medical emergency, call 911.   ADDITIONAL HEALTHCARE OPTIONS FOR PATIENTS  [facility] / e-Visit: [email or web address]         [facility]: [phone]  [facility]: [phone]                   [facility]: [phone]   

## 2019-12-26 NOTE — Progress Notes (Signed)
Radiation Oncology         [phone] ________________________________  Name: Jerry [name] MRN: [id number]  Date: 12/26/2019  DOB: 11/07/1936  Follow-Up Visit Note  CC: Marin Olp, MD  Marin Olp, MD    ICD-10-CM   1. Lung nodule  R91.1     Diagnosis:   83 y.o. male with Clinical Stage IA adenocarcinoma of the right upper lobe  Interval Since Last Radiation:  2 years, 7 months  05/01/17 - 05/09/17: Right lung / 54 Gy in 3 fractions  07/2000: Prostate radioactive seed placement (Dr. Valere Dross)  Narrative:  The patient returns today for routine follow-up and to review recent CT scan.   Performed on 12/23/2019, his follow up chest CT scan showed no evidence of local recurrence or metastatic disease in the chest.  On review of systems, the patient reports no pain in the chest area significant cough or hemoptysis.  He denies any breathing issues.  He is accompanied by his daughter today  ALLERGIES:  is allergic to namenda [memantine].  Meds: Current Outpatient Medications  Medication Sig Dispense Refill  . amLODipine-olmesartan (AZOR) 5-40 MG tablet TAKE 1 TABLET BY MOUTH DAILY 90 tablet 1  . atorvastatin (LIPITOR) 40 MG tablet TAKE 1 TABLET BY MOUTH DAILY 90 tablet 1  . donepezil (ARICEPT) 10 MG tablet TAKE 1 TABLET BY MOUTH DAILY 90 tablet 1  . ferrous sulfate 325 (65 FE) MG tablet Take 325 mg by mouth daily. Patient taking three times a week     No current facility-administered medications for this encounter.    Physical Findings: The patient is in no acute distress. Patient is alert and oriented.  weight is 211 lb 3.2 oz (95.8 kg). His temperature is 98.7 F (37.1 C). His blood pressure is 126/47 (abnormal) and his pulse is 68. His respiration is 20 and oxygen saturation is 99%.   Lungs are clear to auscultation bilaterally. Heart has regular rate and rhythm. No palpable cervical, supraclavicular, or axillary adenopathy. Abdomen soft, non-tender, normal  bowel sounds.  Lab Findings: Lab Results  Component Value Date   WBC 7.2 06/18/2019   HGB 12.2 (L) 06/18/2019   HCT 36.8 (L) 06/18/2019   MCV 90.3 06/18/2019   PLT 177.0 06/18/2019    Radiographic Findings: CT Chest Wo Contrast  Result Date: 12/23/2019 CLINICAL DATA:  Right upper lobe lung cancer, assess response EXAM: CT CHEST WITHOUT CONTRAST TECHNIQUE: Multidetector CT imaging of the chest was performed following the standard protocol without IV contrast. COMPARISON:  06/05/2019, 04/18/2018, 120 1,019, 926 1,000, PET-CT, 314 1,000 FINDINGS: Cardiovascular: Aortic atherosclerosis. Aortic valve calcifications. Three-vessel coronary artery calcifications. Normal heart size. No pericardial effusion. Mediastinum/Nodes: No enlarged mediastinal, hilar, or axillary lymph nodes. Thyroid gland, trachea, and esophagus demonstrate no significant findings. Lungs/Pleura: Unchanged bandlike post treatment scarring and volume loss of the peripheral right upper lobe, with unchanged internal nodular components (series 5, image 42). Stable, benign small pulmonary nodules of the lower lobes bilaterally, not previously FDG PET avid (series 5, image 108). No pleural effusion or pneumothorax. Upper Abdomen: No acute abnormality. Musculoskeletal: No chest wall mass or suspicious bone lesions identified. IMPRESSION: 1. Stable bandlike post treatment scarring and volume loss of the peripheral right upper lobe, with unchanged internal nodular components. No evidence of local recurrence or metastatic disease in the chest. 2. Stable, benign small pulmonary nodules of the lower lobes bilaterally, not previously FDG avid. 3. Three-vessel coronary artery and aortic valve calcifications. 4. Aortic Atherosclerosis (ICD10-I70.0).  Electronically Signed   By: Eddie Candle M.D.   On: 12/23/2019 16:23    Impression:  Clinical Stage IA adenocarcinoma of the right upper lobe.   No evidence of recurrence on physical exam or recent  chest CT scan.  Plan:  The patient will be scheduled for chest CT scan in 6 months and follow up soon afterwards.  ____________________________________  Blair Promise, PhD, MD  This document serves as a record of services personally performed by Gery Pray, MD. It was created on his behalf by Wilburn Mylar, a trained medical scribe. The creation of this record is based on the scribe's personal observations and the provider's statements to them. This document has been checked and approved by the attending provider.

## 2019-12-26 NOTE — Progress Notes (Signed)
Pt presents today for f/u with Dr. Sondra Come. Pt is accompanied by daughter. Pt denies cough, hemoptysis. Pt denies SOB. Pt denies difficulty swallowing. Pt denies c/o pain. Pt is here to review recent CT results.  BP (!) 126/47 (BP Location: Right Arm, Patient Position: Sitting, Cuff Size: Large)   Pulse 68   Temp 98.7 F (37.1 C)   Resp 20   Wt 211 lb 3.2 oz (95.8 kg)   SpO2 99%   BMI 33.08 kg/m   Wt Readings from Last 3 Encounters:  12/26/19 211 lb 3.2 oz (95.8 kg)  07/09/19 207 lb (93.9 kg)  06/20/19 207 lb 4 oz (94 kg)   Loma Sousa, RN BSN

## 2020-01-13 ENCOUNTER — Telehealth: Payer: Self-pay | Admitting: Family Medicine

## 2020-01-13 MED ORDER — AMLODIPINE-OLMESARTAN 5-40 MG PO TABS: 1.0000 | ORAL_TABLET | Freq: Every day | ORAL | 1 refills | Status: DC

## 2020-01-13 NOTE — Telephone Encounter (Signed)
MEDICATION: Amlodipine 40 MG  PHARMACY: [facility] [phone]  Comments: Needs authorization  **Let patient know to contact pharmacy at the end of the day to make sure medication is ready. **  ** Please notify patient to allow 48-72 hours to process**  **Encourage patient to contact the pharmacy for refills or they can request refills through [facility]**

## 2020-01-13 NOTE — Addendum Note (Signed)
Addended by: Jerrel Ivory D on: 01/13/2020 02:21 PM   Modules accepted: Orders

## 2020-01-13 NOTE — Telephone Encounter (Signed)
MEDICATION: Amlodipine 40 MG  PHARMACY: [facility] [phone]  Comments: Needs authorization  **Let patient know to contact pharmacy at the end of the day to make sure medication is ready. **  ** Please notify patient to allow 48-72 hours to process**  **Encourage patient to contact the pharmacy for refills or they can request refills through [facility]**

## 2020-01-13 NOTE — Telephone Encounter (Signed)
Pt refill requested by pharmacy. Refill sent.

## 2020-02-12 ENCOUNTER — Other Ambulatory Visit: Payer: Self-pay | Admitting: Family Medicine

## 2020-02-12 DIAGNOSIS — E785 Hyperlipidemia, unspecified: Secondary | ICD-10-CM

## 2020-03-04 ENCOUNTER — Telehealth: Payer: Self-pay | Admitting: Family Medicine

## 2020-03-04 NOTE — Telephone Encounter (Signed)
Please advise 

## 2020-03-04 NOTE — Telephone Encounter (Signed)
Pt daughter called asking about COVID Vaccine shot. Wants recommendation on if pt should receive the vaccine. Please advise.

## 2020-03-05 NOTE — Telephone Encounter (Signed)
Please route to Dr. Yong Channel

## 2020-03-05 NOTE — Telephone Encounter (Signed)
I believe potential benefits outweigh potential risks of vaccination-I would recommend vaccination

## 2020-03-05 NOTE — Telephone Encounter (Signed)
Please advise 

## 2020-03-05 NOTE — Telephone Encounter (Signed)
Returned call and advised daughter of Dr. Yong Channel message.

## 2020-03-12 ENCOUNTER — Telehealth: Payer: Self-pay | Admitting: Family Medicine

## 2020-03-12 NOTE — Telephone Encounter (Signed)
Called both numbers on file. Both VM full/unable to leave message. Pt due to schedule Medicare Annual Wellness Visit (AWV) either virtually/audio only OR in office. Whatever the patients preference is.  Last AWV 3.29.18; please schedule at anytime with [facility]Nurse Health Advisor at [facility].

## 2020-03-12 NOTE — Telephone Encounter (Signed)
Patient daughter returning missed  call  glecia [phone]

## 2020-03-13 NOTE — Telephone Encounter (Signed)
Called and let daughter know message about getting covid

## 2020-03-26 ENCOUNTER — Telehealth: Payer: Self-pay | Admitting: Family Medicine

## 2020-03-26 NOTE — Telephone Encounter (Signed)
Deanne from [facility] is calling in, stating they faxed over several orders for the patient, asked if someone could give her a call back today or possibly fax over the orders to [phone].  Order # - [id number] was faxed on 10/16/19, [phone] was faxed over on 10/14/19, and [id number] was faxed over on 10/12/19

## 2020-03-26 NOTE — Telephone Encounter (Signed)
Called reviewed and faxed orders

## 2020-04-28 ENCOUNTER — Ambulatory Visit: Payer: Medicare Other | Admitting: Family Medicine

## 2020-05-07 ENCOUNTER — Other Ambulatory Visit: Payer: Medicare Other

## 2020-05-07 ENCOUNTER — Other Ambulatory Visit: Payer: Self-pay

## 2020-05-07 ENCOUNTER — Encounter: Payer: Self-pay | Admitting: Family Medicine

## 2020-05-07 ENCOUNTER — Ambulatory Visit (INDEPENDENT_AMBULATORY_CARE_PROVIDER_SITE_OTHER): Payer: Medicare Other | Admitting: Family Medicine

## 2020-05-07 VITALS — BP 120/64 | HR 78 | Temp 98.4°F | Ht 67.0 in | Wt 212.8 lb

## 2020-05-07 DIAGNOSIS — E785 Hyperlipidemia, unspecified: Secondary | ICD-10-CM | POA: Diagnosis not present

## 2020-05-07 DIAGNOSIS — I10 Essential (primary) hypertension: Secondary | ICD-10-CM

## 2020-05-07 DIAGNOSIS — R790 Abnormal level of blood mineral: Secondary | ICD-10-CM

## 2020-05-07 DIAGNOSIS — I700 Atherosclerosis of aorta: Secondary | ICD-10-CM

## 2020-05-07 DIAGNOSIS — Z8546 Personal history of malignant neoplasm of prostate: Secondary | ICD-10-CM

## 2020-05-07 DIAGNOSIS — F0390 Unspecified dementia without behavioral disturbance: Secondary | ICD-10-CM

## 2020-05-07 DIAGNOSIS — F03B0 Unspecified dementia, moderate, without behavioral disturbance, psychotic disturbance, mood disturbance, and anxiety: Secondary | ICD-10-CM

## 2020-05-07 DIAGNOSIS — J439 Emphysema, unspecified: Secondary | ICD-10-CM

## 2020-05-07 NOTE — Patient Instructions (Addendum)
Health Maintenance Due  Topic Date Due  . COVID-19 Vaccine (1)- please consider getting this Never done   Thanks for scheduling labs

## 2020-05-07 NOTE — Progress Notes (Signed)
Phone [phone] In person visit   Subjective:   Jerry [name] is a 84 y.o. year old very pleasant male patient who presents for/with See problem oriented charting Chief Complaint  Patient presents with  . Hypertension  . Hyperlipidemia   This visit occurred during the SARS-CoV-2 public health emergency.  Safety protocols were in place, including screening questions prior to the visit, additional usage of staff PPE, and extensive cleaning of exam room while observing appropriate contact time as indicated for disinfecting solutions.   Past Medical History-  Patient Active Problem List   Diagnosis Date Noted  . History of lung cancer 09/18/2017    Priority: High  . Moderate dementia without behavioral disturbance ([place]) 07/17/2015    Priority: High  . Hyperlipidemia 10/15/2014    Priority: Medium  . Gout of big toe 08/04/2008    Priority: Medium  . Essential hypertension 07/25/2007    Priority: Medium  . PROSTATE CANCER, HX OF 07/25/2007    Priority: Medium  . Aortic atherosclerosis ([place]) 01/06/2018    Priority: Low  . Pulmonary emphysema ([place])     Priority: Low  . Allergic rhinitis 10/15/2014    Priority: Low  . Overactive bladder 10/15/2014    Priority: Low  . CARDIAC MURMUR 01/10/2011    Priority: Low  . ORGANIC IMPOTENCE 08/12/2010    Priority: Low  . Exertional dyspnea 03/15/2017  . Parotiditis 02/08/2017  . Facial swelling 02/08/2017  . Lung nodule 02/08/2017  . Submandibular space infection     Medications- reviewed and updated Current Outpatient Medications  Medication Sig Dispense Refill  . amLODipine-olmesartan (AZOR) 5-40 MG tablet Take 1 tablet by mouth daily. 90 tablet 1  . atorvastatin (LIPITOR) 40 MG tablet TAKE 1 TABLET BY MOUTH DAILY 90 tablet 1  . donepezil (ARICEPT) 10 MG tablet TAKE 1 TABLET BY MOUTH DAILY 90 tablet 1  . ferrous sulfate 325 (65 FE) MG tablet Take 325 mg by mouth daily. Patient taking three times a week     No current  facility-administered medications for this visit.     Objective:  BP 120/64 (BP Location: Left Arm, Patient Position: Sitting, Cuff Size: Normal)   Pulse 78   Temp 98.4 F (36.9 C) (Temporal)   Ht 5\' 7"  (1.702 m)   Wt 212 lb 12.8 oz (96.5 kg)   SpO2 96%   BMI 33.33 kg/m  Gen: NAD, resting comfortably  CV: RRR  Lungs: nonlabored, normal respiratory rate Abdomen: soft/nondistended      Assessment and Plan   #Alzheimer's dementia S: Daughter [name] locally doing most of caregiving.  She has a separate apartment but stays with her father the vast 56 the time-essentially lives with him.  [name] also cares for her grandchildren multiple days of the week-unfortunately when the patient is around [place] grandchildren it can produce agitation for the patient.  Family thinks it would be better if she was able to care for her grandchildren at her apartment but she is concerned about leaving her father alone-they apparently have some hours from social services approximately 10 hours a week of help but this has been incredibly inconsistent and difficult to manage schedules with the grandchildren. Hours of help between 7 Am to 5 PM on weekdays would really help address the patient's needs. Specifically in that time frame he needs -meal prep -making sure meds are taken in AM -encouraging physical activity  The concerned about leaving patient alone is wandering-as long as someone is around he will stay  in the home but apparently has been prone to wandering otherwise  Patient did have [facility] services in the past-nursing, PT, OT for what sounds like up to 6 weeks but they do not do personal care services apparently.  Family has also paid a family friend come in for a few hours at a time but this is also inconsistent.  Daughter Cristopher Estimable is also present at visit today-there clearly is some tension between [place]. Cristopher Estimable is having to return to [place] soon though to care for her  family there.  A/P: 84 year old male with Alzheimer's dementia-I do think home health aide would be helpful for help with meal prep, making sure her meds are taken in the morning-this would allow daughter to care for her grandchildren who have a different location which would not agitate the patient as much. -Clearly some tension in the family about long-term planning for patient.  We will also order social work along with request for home health aide through home health to help family navigate these difficult times.  #hypertension S: medication: amlodipine-olmesartan 5-40mg .  BP Readings from Last 3 Encounters:  05/07/20 120/64  12/26/19 (!) 126/47  06/20/19 136/70  A/P:  Stable. Continue current medications.    #hyperlipidemia/aortic atherosclerosis S: Medication: Atorvastatin 40 mg Lab Results  Component Value Date   CHOL 142 06/18/2019   HDL 40.80 06/18/2019   LDLCALC 80 06/18/2019   LDLDIRECT 65.0 11/23/2017   TRIG 105.0 06/18/2019   CHOLHDL 3 06/18/2019   A/P: LDL slightly above goal of 70 or less at 80 on last check-with that being said I do not feel strongly about increasing dose of medicine.  I did discuss possibly switching to rosuvastatin which may be linked to less memory issues compared to atorvastatin-they would like to hold off for now  Continue statin for risk factor modification for aortic atherosclerosis  #Low iron-patient taking iron every other day but causing some constipation.  We will update ferritin level today.  Previous I had recommended GI follow-up due to low ferritin but patient/family have not had a chance to follow severe with this-with his memory changes they did not feel strongly about investigating for potential colon cancer today -We will see if we can decrease iron supplements to twice a week or perhaps once a week depending on levels  #emphysema-patient with known COPD but no regular inhaler use.  Continue to monitor without medication  #History of  prostate cancer-we will update PSA with labs  #History of lung cancer-recently had CT scan December 23, 2019-no evidence of recurrence  Recommended follow up: Future Appointments  Date Time Provider [place]  06/25/2020 11:00 AM Gery Pray, MD [facility] None    Lab/Order associations: Will come back for fasting labs tomorrow   ICD-10-CM   1. Essential hypertension  I10   2. Hyperlipidemia, unspecified hyperlipidemia type  E78.5 CBC with Differential/Platelet    Comprehensive metabolic panel    Lipid panel  3. PROSTATE CANCER, HX OF  Z85.46 PSA  4. Low ferritin  R79.0 IBC + Ferritin  5. Moderate dementia without behavioral disturbance ([place])  F03.90 Ambulatory referral to Home Health    CANCELED: Ambulatory referral to [place]  6. Pulmonary emphysema, unspecified emphysema type (HCC) Chronic J43.9   7. Aortic atherosclerosis (HCC) Chronic I70.0    Time Spent: 44 minutes of total time (4:45 PM- 5:24 PM, 10:13-10:18 PM) was spent on the date of the encounter performing the following actions: chart review prior to seeing the patient,  obtaining history, performing a medically necessary exam, counseling on the treatment plan, placing orders, and documenting in our EHR.   Return precautions advised.  Garret Reddish, MD

## 2020-05-11 ENCOUNTER — Telehealth: Payer: Self-pay | Admitting: Family Medicine

## 2020-05-11 NOTE — Telephone Encounter (Signed)
Please advise 

## 2020-05-11 NOTE — Telephone Encounter (Signed)
Pt's daughter [name] called -   She wanted to clarify that no one lives in the house with him. She knows Dr Yong Channel was told something different at the appointment last week.   Patient spoke for about 30 minutes telling me what her father needs.  It sounds like she is looking for a care taker for him and not Home Health.  She would like someone with him from 8-4 or 8-5 Mondays, Wednesdays and Fridays.  He needs someone that will make sure he eats and takes his medications.  She said he will bathe if he knows someone is coming over.  Otherwise, he doesn't want to do it.  He will probably need assistance with that eventually. They dont want him left alone.  Between her and her family, they have nights covered.  She said her dad would be uncomfortable having a non family member staying in his house overnight.  She mentioned that social services can have someone there 6-8 hrs per week.  She would like some recommendations on who she can contact or what resources we may have available.  Gloria's phone number - [phone]

## 2020-05-11 NOTE — Telephone Encounter (Signed)
I ordered social work with home health orders.   I would speak with the social worker from home health.  I would be honest that I am not sure that patient is going to get exactly what Jerry [name] wants- I did request for a home health aide with last home health orders but I am not sure if that is going to qualify patient for what they are requesting

## 2020-05-13 ENCOUNTER — Other Ambulatory Visit: Payer: Self-pay

## 2020-05-13 DIAGNOSIS — R413 Other amnesia: Secondary | ICD-10-CM

## 2020-05-13 MED ORDER — DONEPEZIL HCL 10 MG PO TABS: ORAL_TABLET | ORAL | 1 refills | Status: DC

## 2020-05-20 ENCOUNTER — Encounter (HOSPITAL_COMMUNITY): Payer: Self-pay

## 2020-05-20 ENCOUNTER — Other Ambulatory Visit: Payer: Self-pay

## 2020-05-20 ENCOUNTER — Ambulatory Visit (HOSPITAL_COMMUNITY)
Admission: EM | Admit: 2020-05-20 | Discharge: 2020-05-20 | Disposition: A | Discharge status: Home / Self Care | Payer: Medicare Other | Attending: Family Medicine | Admitting: Family Medicine

## 2020-05-20 DIAGNOSIS — Z20822 Contact with and (suspected) exposure to covid-19: Secondary | ICD-10-CM | POA: Diagnosis present

## 2020-05-20 NOTE — ED Triage Notes (Signed)
Pt was around grandson that was COVID+ and wants test

## 2020-05-20 NOTE — Discharge Instructions (Signed)
You have been tested for COVID-19 today. °If your test returns positive, you will receive a phone call from [facility] regarding your results. °Negative test results are not called. °Both positive and negative results area always visible on MyChart. °If you do not have a MyChart account, sign up instructions are provided in your discharge papers. °Please do not hesitate to contact us should you have questions or concerns. ° °

## 2020-05-21 LAB — SARS CORONAVIRUS 2 (TAT 6-24 HRS): SARS Coronavirus 2: NEGATIVE

## 2020-05-21 NOTE — ED Provider Notes (Signed)
[place]   [id number] 05/20/20 Arrival Time: [place]:  1. Close exposure to COVID-19 virus      COVID-19 testing sent. See letter/work note on file for self-isolation guidelines. OTC symptom care as needed.   Follow-up Information    Marin Olp, MD.   Specialty: Family Medicine Why: As needed. Contact information: [place] [address] [phone]           Reviewed expectations re: course of current medical issues. Questions answered. Outlined signs and symptoms indicating need for more acute intervention. Understanding verbalized. After Visit Summary given.   SUBJECTIVE: History from: patient. TAIQUAN CAMPANARO is a 84 y.o. male who requests COVID-19 testing. Known COVID-19 contact: grandson. Recent travel: none. Denies: runny nose, congestion, fever, cough, sore throat, difficulty breathing and headache. Normal PO intake without n/v/d.    OBJECTIVE:  Vitals:   05/20/20 1936 05/20/20 1939  BP: 126/74   Pulse: 84   Resp: 18   Temp: 100.3 F (37.9 C)   TempSrc: Oral   SpO2: 97%   Weight:  86.2 kg  Height:  5\' 9"  (1.753 m)    General appearance: alert; no distress Eyes: PERRLA; EOMI; conjunctiva normal HENT: [place]; AT Neck: supple  Lungs: speaks full sentences without difficulty; unlabored Extremities: no edema Skin: warm and dry Neurologic: normal gait Psychological: alert and cooperative; normal mood and affect  Labs:  Labs Reviewed  SARS CORONAVIRUS 2 (TAT 6-24 HRS)     Allergies  Allergen Reactions  . Namenda [Memantine] Other (See Comments)    Made pt feel dizzy    Past Medical History:  Diagnosis Date  . Cancer of prostate ([place]) 2001  . Chronic kidney disease (CKD), stage III (moderate)    [name] 02/08/2017  . Dementia ([place])   . DIVERTICULOSIS, COLON 07/25/2007   Qualifier: Diagnosis of  By: [name], LPN, [name]   . High cholesterol   . History of radiation therapy  05/01/17-05/09/17   right lung 54 Gy in 3 fractions  . Hypertension   . Lung cancer ([place]) 03/2017   Social History   Socioeconomic History  . Marital status: Widowed    Spouse name: Not on file  . Number of children: Not on file  . Years of education: Not on file  . Highest education level: Not on file  Occupational History  . Not on file  Tobacco Use  . Smoking status: Former Smoker    Packs/day: 0.50    Quit date: 12/26/1988    Years since quitting: 31.4  . Smokeless tobacco: Never Used  . Tobacco comment: "I don't remember how long really" (02/08/2017)  Substance and Sexual Activity  . Alcohol use: No  . Drug use: No  . Sexual activity: Not Currently  Other Topics Concern  . Not on file  Social History Narrative   Separated since 2010. 4 kids. 4 grandkids. 2 greatgrandkids.       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 Dementia unfortunately stopped these travels.    Social Determinants of Health   Financial Resource Strain:   . Difficulty of Paying Living Expenses:   Food Insecurity:   . Worried About Charity fundraiser in the Last Year:   . Arboriculturist in the Last Year:   Transportation Needs:   . Film/video editor (Medical):   Marland Kitchen Lack of Transportation (Non-Medical):   Physical Activity:   .  Days of Exercise per Week:   . Minutes of Exercise per Session:   Stress:   . Feeling of Stress :   Social Connections:   . Frequency of Communication with Friends and Family:   . Frequency of Social Gatherings with Friends and Family:   . Attends Religious Services:   . Active Member of Clubs or Organizations:   . Attends Archivist Meetings:   Marland Kitchen Marital Status:   Intimate Partner Violence:   . Fear of Current or Ex-Partner:   . Emotionally Abused:   Marland Kitchen Physically Abused:   . Sexually Abused:    Family History  Problem Relation Age of Onset  . Early death Father        cirrhosis  . Alcoholism Father    . Cirrhosis Other   . Breast cancer Sister    Past Surgical History:  Procedure Laterality Date  . DIRECT LARYNGOSCOPY N/A 06/21/2017   Procedure: DIRECT LARYNGOSCOPY;  Surgeon: Melissa Montane, MD;  Location: [facility];  Service: ENT;  Laterality: N/A;  . EYE SURGERY    . INSERTION PROSTATE RADIATION SEED  07/2000   [name] 05/09/2011  . PENILE PROSTHESIS IMPLANT  02/2002   Insertion of Mentor three piece inflatable penile prosthesis./notes 05/09/2011  . PENILE PROSTHESIS PLACEMENT  06/2003   Placement of Mentor rod prosthesis/notes 05/09/2011  . REFRACTIVE SURGERY Bilateral   . REMOVAL OF PENILE PROSTHESIS  06/2003   Removal of three-piece Mentor prosthesis/notes 05/09/2011  . SCROTAL SURGERY  06/2003   Repair of scrotal defect/notes 05/09/2011     Vanessa Kick, MD 05/21/20 [phone]

## 2020-06-23 ENCOUNTER — Ambulatory Visit (HOSPITAL_COMMUNITY)
Admission: RE | Admit: 2020-06-23 | Discharge: 2020-06-23 | Disposition: A | Discharge status: Home / Self Care | Payer: Medicare Other | Source: Ambulatory Visit | Attending: Radiation Oncology | Admitting: Radiation Oncology

## 2020-06-23 ENCOUNTER — Other Ambulatory Visit: Payer: Self-pay

## 2020-06-23 ENCOUNTER — Encounter (HOSPITAL_COMMUNITY): Payer: Self-pay

## 2020-06-23 DIAGNOSIS — R918 Other nonspecific abnormal finding of lung field: Secondary | ICD-10-CM | POA: Diagnosis not present

## 2020-06-23 DIAGNOSIS — I2510 Atherosclerotic heart disease of native coronary artery without angina pectoris: Secondary | ICD-10-CM | POA: Diagnosis not present

## 2020-06-23 DIAGNOSIS — C3490 Malignant neoplasm of unspecified part of unspecified bronchus or lung: Secondary | ICD-10-CM | POA: Diagnosis not present

## 2020-06-23 DIAGNOSIS — R911 Solitary pulmonary nodule: Secondary | ICD-10-CM | POA: Diagnosis not present

## 2020-06-23 DIAGNOSIS — I700 Atherosclerosis of aorta: Secondary | ICD-10-CM | POA: Diagnosis not present

## 2020-06-24 ENCOUNTER — Telehealth: Payer: Self-pay | Admitting: *Deleted

## 2020-06-24 NOTE — Telephone Encounter (Signed)
Called patient's daughter to offer fu appt. For 07-06-20 @ 3 pm, lvm for a return call

## 2020-06-25 ENCOUNTER — Ambulatory Visit: Payer: Medicare Other | Admitting: Radiation Oncology

## 2020-07-06 ENCOUNTER — Ambulatory Visit
Admission: RE | Admit: 2020-07-06 | Discharge: 2020-07-06 | Disposition: A | Discharge status: Home / Self Care | Payer: Medicare Other | Source: Ambulatory Visit | Attending: Radiation Oncology | Admitting: Radiation Oncology

## 2020-07-06 ENCOUNTER — Encounter: Payer: Self-pay | Admitting: Radiation Oncology

## 2020-07-06 ENCOUNTER — Other Ambulatory Visit: Payer: Self-pay

## 2020-07-06 VITALS — BP 130/54 | HR 58 | Temp 98.9°F | Resp 20 | Ht 70.0 in | Wt 215.4 lb

## 2020-07-06 DIAGNOSIS — Z85118 Personal history of other malignant neoplasm of bronchus and lung: Secondary | ICD-10-CM

## 2020-07-06 DIAGNOSIS — Z923 Personal history of irradiation: Secondary | ICD-10-CM | POA: Insufficient documentation

## 2020-07-06 DIAGNOSIS — R918 Other nonspecific abnormal finding of lung field: Secondary | ICD-10-CM | POA: Diagnosis not present

## 2020-07-06 DIAGNOSIS — Z79899 Other long term (current) drug therapy: Secondary | ICD-10-CM | POA: Diagnosis not present

## 2020-07-06 DIAGNOSIS — I2510 Atherosclerotic heart disease of native coronary artery without angina pectoris: Secondary | ICD-10-CM | POA: Insufficient documentation

## 2020-07-06 DIAGNOSIS — Z08 Encounter for follow-up examination after completed treatment for malignant neoplasm: Secondary | ICD-10-CM | POA: Diagnosis not present

## 2020-07-06 DIAGNOSIS — R911 Solitary pulmonary nodule: Secondary | ICD-10-CM

## 2020-07-06 NOTE — Progress Notes (Signed)
Weight and vitals stable. Denies pain. Denies cough. Received both COVID injection one 05/07/20 and the second 05/27/20 Pfizer. Denies shortness of breath. Denies chest pain. Denies pain associated with swallowing. Denies headache, dizziness, nausea or vomiting.   BP (!) 130/54   Pulse (!) 58   Temp 98.9 F (37.2 C) (Oral)   Resp 20   Ht 5\' 10"  (1.778 m)   Wt 215 lb 6.4 oz (97.7 kg)   SpO2 100%   BMI 30.91 kg/m  Wt Readings from Last 3 Encounters:  07/06/20 215 lb 6.4 oz (97.7 kg)  05/20/20 190 lb (86.2 kg)  05/07/20 212 lb 12.8 oz (96.5 kg)

## 2020-07-06 NOTE — Progress Notes (Signed)
Radiation Oncology         [phone] ________________________________  Name: Jerry [name] MRN: [id number]  Date: 07/06/2020  DOB: 03-26-1936  Follow-Up Visit Note  CC: Jerry Olp, MD  Jerry Olp, MD    ICD-10-CM   1. Lung nodule  R91.1   2. History of lung cancer  Z85.118     Diagnosis: Clinical Stage IA adenocarcinoma of the right upper lobe  Interval Since Last Radiation: Three years, one month, three weeks, and six days.  05/01/17 - 05/09/17: Right lung / 54 Gy in 3 fractions  07/2000: Prostate radioactive seed placement (Dr. Valere [name])  Narrative:  The patient returns today for routine follow-up. He is doing well overall. Sine his last visit, he was seen at Urgent Care on 05/20/2020 for close exposure to the COVID-19 virus. His test came back negative.  Of note, he received his COVID-19 vaccinations on 05/07/2020 and 05/27/2020.  Follow-up chest CT scan on 06/23/2020 showed post-treatment changes in the right upper lobe with crescentic septal thickening and unchanged parenchymal distortion. There was development of peripheral subpleural reticulation/ground-glass in the left chest that was unspecific but could be seen in the setting of recent infection. Finally, there were scattered basilar nodules, greatest in the right chest with similar appearance in previous imaging.  On review of systems, he reports no complaints. He denies cough, shortness of breath, chest pain, pain with swallowing, difficulty swallowing, headache, dizziness, nausea, and vomiting.  ALLERGIES:  is allergic to namenda [memantine].  Meds: Current Outpatient Medications  Medication Sig Dispense Refill  . amLODipine-olmesartan (AZOR) 5-40 MG tablet Take 1 tablet by mouth daily. 90 tablet 1  . atorvastatin (LIPITOR) 40 MG tablet TAKE 1 TABLET BY MOUTH DAILY 90 tablet 1  . donepezil (ARICEPT) 10 MG tablet TAKE 1 TABLET BY MOUTH DAILY 90 tablet 1  . ferrous sulfate 325 (65 FE) MG tablet  Take 325 mg by mouth daily. Patient taking three times a week (Patient not taking: Reported on 07/06/2020)     No current facility-administered medications for this encounter.    Physical Findings: The patient is in no acute distress. Patient is alert and oriented.  height is 5\' 10"  (1.778 m) and weight is 215 lb 6.4 oz (97.7 kg). His oral temperature is 98.9 F (37.2 C). His blood pressure is 130/54 (abnormal) and his pulse is 58 (abnormal). His respiration is 20 and oxygen saturation is 100%.   Lungs are clear to auscultation bilaterally. Heart has regular rate and rhythm. No palpable cervical, supraclavicular, or axillary adenopathy. Abdomen soft, non-tender, normal bowel sounds.   Lab Findings: Lab Results  Component Value Date   WBC 7.2 06/18/2019   HGB 12.2 (L) 06/18/2019   HCT 36.8 (L) 06/18/2019   MCV 90.3 06/18/2019   PLT 177.0 06/18/2019    Radiographic Findings: CT Chest Wo Contrast  Result Date: 06/23/2020 CLINICAL DATA:  Non-small cell lung cancer, follow-up EXAM: CT CHEST WITHOUT CONTRAST TECHNIQUE: Multidetector CT imaging of the chest was performed following the standard protocol without IV contrast. COMPARISON:  12/23/2019 FINDINGS: Cardiovascular: Calcified atheromatous changes in the thoracic aorta. No sign of aneurysm. Calcified coronary artery disease. Heart size normal without pericardial effusion. Central pulmonary vasculature is normal caliber. Mediastinum/Nodes: Thoracic inlet structures are normal. No axillary lymphadenopathy. No hilar adenopathy with limited assessment without contrast. No mediastinal adenopathy. Esophagus is normal. Lungs/Pleura: Small nodule along the pleural surface in the RIGHT middle lobe (image 83, series 7) 7 x 4 mm, previously  7 by 4 mm. Small nodule in the RIGHT lung base approximately 7 mm unchanged. Small 3 mm peripheral nodule in the RIGHT lung base is also stable. Nodule along the medial pleural surface and other small nodules along the  pleural surface in the RIGHT inferior chest are stable. Post treatment changes in the RIGHT upper lobe with crescentic septal thickening and parenchymal distortion without change. Peripheral subpleural reticulation/ground-glass has developed in the LEFT chest. Small nodule along the LEFT heart border is unchanged on image 85 of series 7 approximately 3 mm. Airways are patent. Upper Abdomen: Incidental imaging of upper abdominal contents without acute finding. Musculoskeletal: Spinal degenerative changes. No acute or destructive bone process. IMPRESSION: 1. Post treatment changes in the RIGHT upper lobe with crescentic septal thickening and parenchymal distortion without change. 2. Peripheral subpleural reticulation/ground-glass has developed in the LEFT chest. Findings are nonspecific but can be seen in the setting of recent infection. Correlate with symptoms. Scattered basilar nodules greatest in the RIGHT chest with similar appearance to previous imaging. 3.  atherosclerosis and three-vessel coronary artery disease. Aortic Atherosclerosis (ICD10-I70.0). Electronically Signed   By: Jerry [name] M.D.   On: 06/23/2020 12:54    Impression:  Clinical Stage IA adenocarcinoma of the right upper lobe  No evidence of recurrence on physical exam or recent chest CT scan.  Plan: The patient will be scheduled for chest CT scan in six months and follow-up soon afterwards.   Total time spent in this encounter was 15 minutes which included reviewing the patient's most recent Urgent Care visit, chest CT scan, physical examination, documentation, and ordering of future chest CT scan.  ____________________________________  Jerry Promise, PhD, MD  This document serves as a record of services personally performed by Jerry Pray, MD. It was created on his behalf by Jerry [name], a trained medical scribe. The creation of this record is based on the scribe's personal observations and the provider's statements to them.  This document has been checked and approved by the attending provider.

## 2020-07-15 ENCOUNTER — Other Ambulatory Visit: Payer: Self-pay | Admitting: Family Medicine

## 2020-08-05 ENCOUNTER — Telehealth: Payer: Self-pay | Admitting: Family Medicine

## 2020-08-05 NOTE — Telephone Encounter (Signed)
Left message for patient to call back and schedule Medicare Annual Wellness Visit (AWV) either virtually/audio only OR in office. Whatever the patients preference is.  Last AWV 03/23/17; please schedule at anytime with [facility] Health Advisor at [facility].  This should be a 45 minute visit.

## 2020-09-14 ENCOUNTER — Telehealth: Payer: Self-pay | Admitting: Family Medicine

## 2020-09-14 DIAGNOSIS — E785 Hyperlipidemia, unspecified: Secondary | ICD-10-CM

## 2020-09-14 MED ORDER — ATORVASTATIN CALCIUM 40 MG PO TABS: 40.0000 mg | ORAL_TABLET | Freq: Every day | ORAL | 3 refills | Status: DC

## 2020-09-14 NOTE — Telephone Encounter (Signed)
MEDICATION: Atorvastatin 40 MG  PHARMACY: [facility]  Comments: 30 day supply with refills  **Let patient know to contact pharmacy at the end of the day to make sure medication is ready. **  ** Please notify patient to allow 48-72 hours to process**  **Encourage patient to contact the pharmacy for refills or they can request refills through [facility]**

## 2020-10-03 ENCOUNTER — Other Ambulatory Visit: Payer: Self-pay | Admitting: Family Medicine

## 2020-10-03 DIAGNOSIS — R413 Other amnesia: Secondary | ICD-10-CM

## 2020-10-08 NOTE — Progress Notes (Signed)
Phone [phone] In person visit   Subjective:   Jerry [name] is a 84 y.o. year old very pleasant male patient who presents for/with See problem oriented charting Chief Complaint  Patient presents with  . Knee Pain   This visit occurred during the SARS-CoV-2 public health emergency.  Safety protocols were in place, including screening questions prior to the visit, additional usage of staff PPE, and extensive cleaning of exam room while observing appropriate contact time as indicated for disinfecting solutions.   Past Medical History-  Patient Active Problem List   Diagnosis Date Noted  . History of lung cancer 09/18/2017    Priority: High  . Moderate dementia without behavioral disturbance ([place]) 07/17/2015    Priority: High  . Hyperlipidemia 10/15/2014    Priority: Medium  . Gout of big toe 08/04/2008    Priority: Medium  . Essential hypertension 07/25/2007    Priority: Medium  . PROSTATE CANCER, HX OF 07/25/2007    Priority: Medium  . Aortic atherosclerosis ([place]) 01/06/2018    Priority: Low  . Pulmonary emphysema ([place])     Priority: Low  . Allergic rhinitis 10/15/2014    Priority: Low  . Overactive bladder 10/15/2014    Priority: Low  . CARDIAC MURMUR 01/10/2011    Priority: Low  . ORGANIC IMPOTENCE 08/12/2010    Priority: Low  . Exertional dyspnea 03/15/2017  . Parotiditis 02/08/2017  . Facial swelling 02/08/2017  . Lung nodule 02/08/2017  . Submandibular space infection     Medications- reviewed and updated Current Outpatient Medications  Medication Sig Dispense Refill  . amLODipine-olmesartan (AZOR) 5-40 MG tablet TAKE 1 TABLET BY MOUTH DAILY 90 tablet 1  . atorvastatin (LIPITOR) 40 MG tablet Take 1 tablet (40 mg total) by mouth daily. 30 tablet 3  . donepezil (ARICEPT) 10 MG tablet TAKE 1 TABLET BY MOUTH DAILY 90 tablet 1  . diclofenac Sodium (VOLTAREN) 1 % GEL Apply 2 g topically 4 (four) times daily. 100 g 3  . ferrous sulfate 325 (65 FE) MG tablet  Take 325 mg by mouth daily. Patient taking three times a week (Patient not taking: Reported on 07/06/2020)     No current facility-administered medications for this visit.     Objective:  BP 126/72   Pulse 62   Temp 98.6 F (37 C) (Temporal)   Resp 18   Ht '5\' 7"'  (1.702 m)   Wt 219 lb 12.8 oz (99.7 kg)   SpO2 96%   BMI 34.43 kg/m  Gen: NAD, resting comfortably CV: RRR no murmurs rubs or gallops Lungs: CTAB no crackles, wheeze, rhonchi Ext: no edema Skin: warm, dry Neuro: looks to daughter for some answer, pleasantly confused  Bilateral Knee: Normal to inspection with no erythema or effusion or obvious bony abnormalities. Medial joint line tenderness noted bilaterally. Palpation otherwise normal with no warmth or patellar tenderness or condyle tenderness. ROM normal in flexion and extension and lower leg rotation. Ligaments with solid consistent endpoints including ACL, PCL, LCL, MCL. Negative Mcmurray's      Assessment and Plan  Bilateral Knee Pain S Patient mentioned that he has bilateral knee pain. He stated that the pain comes and goes. He stated that its not a everyday thing. No falls. Patient stated that he feels the pain mostly below his knee caps.   Has visit tues/thursday- PT left a book with some knee exercises.  A/P: patient with bilateral medial joint line pain- otherwise reassuring exam- suspect OA- recommended voltaren gel which I  sent in   #hypertension S: medication: amlodipine-olmesartan 5-58m BP Readings from Last 3 Encounters:  10/12/20 126/72  07/06/20 (!) 130/54  05/20/20 126/74  A/P: Stable. Continue current medications.   #hyperlipidemia/aortic atherosclerosis S: Medication: atorvastatin 468m Lab Results  Component Value Date   CHOL 142 06/18/2019   HDL 40.80 06/18/2019   LDLCALC 80 06/18/2019   LDLDIRECT 65.0 11/23/2017   TRIG 105.0 06/18/2019   CHOLHDL 3 06/18/2019   A/P: reasonable control for age- continue current meds- with age and  dementia do not feel like targeting LDL under 70 is prudent even with aortic atherosclerosis  # dementia S:patient considering   Day program 10-2 with [facility]. Needed forms filled out today. Compliant with aricept- symptoms largely stable A/P: dementia noted- continue current meds as stable. Filled out forms for day program   # also reorder labs ordered in may for [facility] and complete today  Recommended follow up: typically recommend at least eevery 6-12 month visit Future Appointments  Date Time Provider DeBushong1/13/2022 11:30 AM [facility]Gery PrayMD [facility]    Lab/Order associations:   ICD-10-CM   1. Essential hypertension  I10   2. Hyperlipidemia, unspecified hyperlipidemia type  E78.5 Lipid Profile    Comp Met (CMET)    CBC w/Diff  3. Low ferritin  R79.0 Iron, TIBC and Ferritin Panel  4. Personal history of malignant neoplasm of prostate  Z85.46 PSA  5. Need for immunization against influenza  Z23 Flu Vaccine QUAD High Dose(Fluad)    Meds ordered this encounter  Medications  . diclofenac Sodium (VOLTAREN) 1 % GEL    Sig: Apply 2 g topically 4 (four) times daily.    Dispense:  100 g    Refill:  3    Return precautions advised.  StGarret ReddishMD

## 2020-10-08 NOTE — Patient Instructions (Addendum)
Health Maintenance Due  Topic Date Due  . INFLUENZA VACCINE high dose flu shot today 07/26/2020   Team please reorder labs from 05/07/20 and send him to lab today  Stamp form and give back to patient  Try voltaren gel for the knee pain up to 4x a day- I think this will help

## 2020-10-12 ENCOUNTER — Other Ambulatory Visit: Payer: Self-pay

## 2020-10-12 ENCOUNTER — Encounter: Payer: Self-pay | Admitting: Family Medicine

## 2020-10-12 ENCOUNTER — Ambulatory Visit (INDEPENDENT_AMBULATORY_CARE_PROVIDER_SITE_OTHER): Payer: Medicare Other | Admitting: Family Medicine

## 2020-10-12 VITALS — BP 126/72 | HR 62 | Temp 98.6°F | Resp 18 | Ht 67.0 in | Wt 219.8 lb

## 2020-10-12 DIAGNOSIS — Z23 Encounter for immunization: Secondary | ICD-10-CM | POA: Diagnosis not present

## 2020-10-12 DIAGNOSIS — Z8546 Personal history of malignant neoplasm of prostate: Secondary | ICD-10-CM | POA: Diagnosis not present

## 2020-10-12 DIAGNOSIS — R790 Abnormal level of blood mineral: Secondary | ICD-10-CM

## 2020-10-12 DIAGNOSIS — E785 Hyperlipidemia, unspecified: Secondary | ICD-10-CM

## 2020-10-12 DIAGNOSIS — I10 Essential (primary) hypertension: Secondary | ICD-10-CM | POA: Diagnosis not present

## 2020-10-12 MED ORDER — DICLOFENAC SODIUM 1 % EX GEL
2.0000 g | Freq: Four times a day (QID) | CUTANEOUS | 3 refills | Status: AC
Start: 2020-10-12 — End: ?

## 2020-10-13 LAB — COMPREHENSIVE METABOLIC PANEL
AG Ratio: 1.3 (calc) (ref 1.0–2.5)
ALT: 16 U/L (ref 9–46)
AST: 15 U/L (ref 10–35)
Albumin: 3.7 g/dL (ref 3.6–5.1)
Alkaline phosphatase (APISO): 71 U/L (ref 35–144)
BUN/Creatinine Ratio: 12 (calc) (ref 6–22)
BUN: 15 mg/dL (ref 7–25)
CO2: 24 mmol/L (ref 20–32)
Calcium: 8.5 mg/dL — ABNORMAL LOW (ref 8.6–10.3)
Chloride: 112 mmol/L — ABNORMAL HIGH (ref 98–110)
Creat: 1.26 mg/dL — ABNORMAL HIGH (ref 0.70–1.11)
Globulin: 2.9 g/dL (calc) (ref 1.9–3.7)
Glucose, Bld: 88 mg/dL (ref 65–99)
Potassium: 4.2 mmol/L (ref 3.5–5.3)
Sodium: 142 mmol/L (ref 135–146)
Total Bilirubin: 0.5 mg/dL (ref 0.2–1.2)
Total Protein: 6.6 g/dL (ref 6.1–8.1)

## 2020-10-13 LAB — CBC WITH DIFFERENTIAL/PLATELET
Absolute Monocytes: 482 cells/uL (ref 200–950)
Basophils Absolute: 63 cells/uL (ref 0–200)
Basophils Relative: 0.8 %
Eosinophils Absolute: 103 cells/uL (ref 15–500)
Eosinophils Relative: 1.3 %
HCT: 39.4 % (ref 38.5–50.0)
Hemoglobin: 12.9 g/dL — ABNORMAL LOW (ref 13.2–17.1)
Lymphs Abs: 2733 cells/uL (ref 850–3900)
MCH: 30 pg (ref 27.0–33.0)
MCHC: 32.7 g/dL (ref 32.0–36.0)
MCV: 91.6 fL (ref 80.0–100.0)
MPV: 11.8 fL (ref 7.5–12.5)
Monocytes Relative: 6.1 %
Neutro Abs: 4519 cells/uL (ref 1500–7800)
Neutrophils Relative %: 57.2 %
Platelets: 221 10*3/uL (ref 140–400)
RBC: 4.3 10*6/uL (ref 4.20–5.80)
RDW: 12.6 % (ref 11.0–15.0)
Total Lymphocyte: 34.6 %
WBC: 7.9 10*3/uL (ref 3.8–10.8)

## 2020-10-13 LAB — IRON,TIBC AND FERRITIN PANEL
%SAT: 21 % (calc) (ref 20–48)
Ferritin: 16 ng/mL — ABNORMAL LOW (ref 24–380)
Iron: 73 ug/dL (ref 50–180)
TIBC: 348 mcg/dL (calc) (ref 250–425)

## 2020-10-13 LAB — LIPID PANEL
Cholesterol: 135 mg/dL (ref <200)
HDL: 36 mg/dL — ABNORMAL LOW (ref >=40)
LDL Cholesterol (Calc): 81 mg/dL (calc)
Non-HDL Cholesterol (Calc): 99 mg/dL (calc) (ref <130)
Total CHOL/HDL Ratio: 3.8 (calc) (ref <5.0)
Triglycerides: 101 mg/dL (ref <150)

## 2020-10-13 LAB — PSA: PSA: 0.11 ng/mL (ref <=4.0)

## 2020-10-13 NOTE — Progress Notes (Signed)
Patient has been scheduled

## 2020-11-08 ENCOUNTER — Other Ambulatory Visit: Payer: Self-pay | Admitting: Family Medicine

## 2020-11-08 DIAGNOSIS — E785 Hyperlipidemia, unspecified: Secondary | ICD-10-CM

## 2020-12-04 ENCOUNTER — Telehealth: Payer: Self-pay

## 2020-12-04 NOTE — Telephone Encounter (Signed)
error 

## 2020-12-21 ENCOUNTER — Telehealth: Payer: Self-pay

## 2020-12-21 NOTE — Telephone Encounter (Signed)
Pt.'s daughter would like to know if its okay to give pt melatonin to help him sleep. Pt has been waking up once being put to bed and wants to talk, they believe due to his dementia. Please advise

## 2020-12-21 NOTE — Telephone Encounter (Signed)
..   LAST APPOINTMENT DATE: 12/04/2020   NEXT APPOINTMENT DATE:@4 /06/2021  MEDICATION:atorvastatin (LIPITOR) 40 MG tablet    PHARMACY:[id number] - [place], [place] - [address]  **Let patient know to contact pharmacy at the end of the day to make sure medication is ready. **  ** Please notify patient to allow 48-72 hours to process**  **Encourage patient to contact the pharmacy for refills or they can request refills through [facility]**  CLINICAL FILLS OUT ALL BELOW:   LAST REFILL:  QTY:  REFILL DATE:    OTHER COMMENTS:    Okay for refill?  Please advise

## 2020-12-21 NOTE — Telephone Encounter (Signed)
Pt has refills at pharmacy

## 2020-12-22 NOTE — Telephone Encounter (Signed)
Called and lm for Jerry [name], yes  pt can take melatonin.

## 2020-12-29 ENCOUNTER — Other Ambulatory Visit: Payer: Self-pay

## 2020-12-29 DIAGNOSIS — E785 Hyperlipidemia, unspecified: Secondary | ICD-10-CM

## 2020-12-29 MED ORDER — ATORVASTATIN CALCIUM 40 MG PO TABS: ORAL_TABLET | ORAL | 3 refills | Status: DC

## 2020-12-29 NOTE — Telephone Encounter (Signed)
Sent new rx to pharmacy 

## 2020-12-29 NOTE — Telephone Encounter (Signed)
I lvm for pharmacy. Pt was given new prescription at last visit 11/08/2020 with 3 refills remaining.

## 2020-12-29 NOTE — Telephone Encounter (Signed)
atorvastatin (LIPITOR) 40 MG tablet  Patient daughter called in and stated the pharmacy said he didn't have any refills left.   :[place], [place]

## 2021-01-02 ENCOUNTER — Other Ambulatory Visit: Payer: Medicare Other

## 2021-01-04 ENCOUNTER — Other Ambulatory Visit: Payer: Self-pay

## 2021-01-06 ENCOUNTER — Other Ambulatory Visit: Payer: Self-pay

## 2021-01-06 ENCOUNTER — Ambulatory Visit (HOSPITAL_COMMUNITY)
Admission: RE | Admit: 2021-01-06 | Discharge: 2021-01-06 | Disposition: A | Discharge status: Home / Self Care | Payer: Medicare Other | Source: Ambulatory Visit | Attending: Radiation Oncology | Admitting: Radiation Oncology

## 2021-01-06 ENCOUNTER — Telehealth: Payer: Self-pay | Admitting: *Deleted

## 2021-01-06 DIAGNOSIS — R911 Solitary pulmonary nodule: Secondary | ICD-10-CM | POA: Diagnosis not present

## 2021-01-06 DIAGNOSIS — C3490 Malignant neoplasm of unspecified part of unspecified bronchus or lung: Secondary | ICD-10-CM | POA: Diagnosis not present

## 2021-01-06 DIAGNOSIS — R918 Other nonspecific abnormal finding of lung field: Secondary | ICD-10-CM | POA: Diagnosis not present

## 2021-01-06 NOTE — Telephone Encounter (Signed)
RETURNED PATIENT'S DAUGHTER - Jerry [name] , SHE WANTS TO MOVE HER DAD'S FU , APPT. MOVED TO 01-18-21 @ 4:15 PM , PATIENT'S DAUGHTER - Jerry [name] AGREED TO THIS DATE AND TIME

## 2021-01-07 ENCOUNTER — Ambulatory Visit: Payer: Medicare Other | Admitting: Radiation Oncology

## 2021-01-08 ENCOUNTER — Telehealth: Payer: Self-pay

## 2021-01-08 NOTE — Telephone Encounter (Signed)
Patient wife and daughter is aware of Dr. Ronney Lion comments, Gave a verbal understanding and states that she is bringing him to get tested in the morning.

## 2021-01-08 NOTE — Telephone Encounter (Signed)
Coricidin is fine  Could also try dextromethorphan/Delsym-Coricidin actually has this medication inside of it

## 2021-01-08 NOTE — Telephone Encounter (Signed)
Patients daughter is calling stating patient is congested and having flu like symptoms and would like to know If there are any recommendations for medication to help with that that he can take with his high BP he tested neg for covid 1/5 and has been coughing and congested and would like a recommendation for over the counter medication

## 2021-01-09 ENCOUNTER — Other Ambulatory Visit: Payer: Medicare Other

## 2021-01-09 DIAGNOSIS — Z20822 Contact with and (suspected) exposure to covid-19: Secondary | ICD-10-CM

## 2021-01-12 ENCOUNTER — Telehealth: Payer: Self-pay

## 2021-01-12 DIAGNOSIS — Z20822 Contact with and (suspected) exposure to covid-19: Secondary | ICD-10-CM | POA: Diagnosis not present

## 2021-01-12 LAB — NOVEL CORONAVIRUS, NAA: SARS-CoV-2, NAA: NOT DETECTED

## 2021-01-12 NOTE — Telephone Encounter (Signed)
Daughter is on [place].  She is requesting a call back to review patients recent COVID test.

## 2021-01-13 NOTE — Telephone Encounter (Signed)
Returned pt call and results are negative pt daughter aware.

## 2021-01-17 NOTE — Progress Notes (Incomplete)
Radiation Oncology         [phone] ________________________________  Name: Jerry [name] MRN: [id number]  Date: 01/18/2021  DOB: Jul 10, 1936  Follow-Up Visit Note  CC: Jerry Olp, MD  Jerry Olp, MD    ICD-10-CM   1. History of lung cancer  Z85.118   2. Lung nodule  R91.1 CT Chest Wo Contrast    Diagnosis: Clinical Stage IA adenocarcinoma of the right upper lobe  Interval Since Last Radiation: Three years, eight months, one week, and two days  05/01/17 - 05/09/17: Right lung / 54 Gy in 3 fractions  07/2000: Prostate radioactive seed placement (Dr. Valere Dross)  Narrative:  The patient returns today for routine follow-up. He is doing well overall. Since his last visit, he underwent a chest CT scan on 01/06/2021 that showed stable post treatment changes in the right upper lobe and unchanged small bilateral pulmonary nodules.  The patient presented today for follow-up.  Upon presentation to the cancer center it was noted that the person who transported him tested positive for Covid and therefore he was not allowed to come in for his follow-up. This I spoke with Jerry [name] his close family member and she will relay the results of the CT scan to the patient.  According to [facility] Jerry [name] has been doing well with no new medical issues.  Despite several close family members having Covid he has not contracted the infection and has not tested positive.  On review of systems, he reports by discussion with family members no reports of chest pain or breathing issues.  No reports of hemoptysis.Marland Kitchen   ALLERGIES:  is allergic to namenda [memantine].  Meds: Current Outpatient Medications  Medication Sig Dispense Refill  . amLODipine-olmesartan (AZOR) 5-40 MG tablet TAKE 1 TABLET BY MOUTH DAILY 90 tablet 1  . atorvastatin (LIPITOR) 40 MG tablet TAKE 1 TABLET(40 MG) BY MOUTH DAILY 30 tablet 3  . diclofenac Sodium (VOLTAREN) 1 % GEL Apply 2 g topically 4 (four) times daily. 100 g 3  .  donepezil (ARICEPT) 10 MG tablet TAKE 1 TABLET BY MOUTH DAILY 90 tablet 1  . ferrous sulfate 325 (65 FE) MG tablet Take 325 mg by mouth daily. Patient taking three times a week (Patient not taking: Reported on 07/06/2020)     No current facility-administered medications for this encounter.    Physical Findings: Physical exam was not performed in light of issues as above  Lab Findings: Lab Results  Component Value Date   WBC 7.9 10/12/2020   HGB 12.9 (L) 10/12/2020   HCT 39.4 10/12/2020   MCV 91.6 10/12/2020   PLT 221 10/12/2020    Radiographic Findings: CT Chest Wo Contrast  Result Date: 01/06/2021 CLINICAL DATA:  None metastatic non-small cell lung cancer to assess treatment response EXAM: CT CHEST WITHOUT CONTRAST TECHNIQUE: Multidetector CT imaging of the chest was performed following the standard protocol without IV contrast. COMPARISON:  Multiple priors including chest CT June 23, 2020 FINDINGS: Cardiovascular: Thoracic aortic atherosclerosis. Calcified coronary artery disease. Normal size heart. No pericardial effusion. Mediastinum/Nodes: No enlarged mediastinal or axillary lymph nodes. Thyroid gland, trachea, and esophagus demonstrate no significant findings. Lungs/Pleura: Unchanged size of the nodule along the pleural surface in the RIGHT middle lobe which measures 7 x 4 mm (series 5, image 84) previously 7 by 4 mm. Small nodule in the RIGHT lung base approximately 7 mm unchanged (series 5, image 114). Small 3 mm peripheral nodule in the RIGHT lung base is unchanged in  size. The additional nodules along the medial pleural surface and other small nodules along the pleural surface in the RIGHT inferior chest are stable. Post treatment changes in the RIGHT upper lobe appears similar to prior with crescentic septal thickening and parenchymal distortion. Near complete resolution of the peripheral subpleural reticulation/ground-glass in the LEFT chest. Pulmonary nodule the left chest are  unchanged in size including the index small 3 mm nodule along the LEFT heart border is unchanged in size (series 5, image 81). Upper Abdomen: Aortic atherosclerosis. Punctate nonobstructive left renal stone. Musculoskeletal: Multilevel degenerative changes spine. No discrete lytic or blastic lesion of bone. IMPRESSION: 1. Stable post treatment changes in the RIGHT upper lobe. 2. Unchanged small bilateral pulmonary nodules. 3. Aortic atherosclerosis. Aortic Atherosclerosis (ICD10-I70.0). Electronically Signed   By: Dahlia Bailiff MD   On: 01/06/2021 18:14    Impression:  Clinical Stage IA adenocarcinoma of the right upper lobe  No evidence of recurrence on  recent chest CT scan.  Plan: The patient will follow up with radiation oncology in six months. He will undergo a chest CT scan prior to that visit.    ____________________________________  Blair Promise, PhD, MD  This document serves as a record of services personally performed by Gery Pray, MD. It was created on his behalf by Clerance Lav, a trained medical scribe. The creation of this record is based on the scribe's personal observations and the provider's statements to them. This document has been checked and approved by the attending provider.

## 2021-01-18 ENCOUNTER — Ambulatory Visit
Admission: RE | Admit: 2021-01-18 | Discharge: 2021-01-18 | Disposition: A | Discharge status: Home / Self Care | Payer: Medicare Other | Source: Ambulatory Visit | Attending: Radiation Oncology | Admitting: Radiation Oncology

## 2021-01-18 ENCOUNTER — Other Ambulatory Visit: Payer: Self-pay

## 2021-01-18 DIAGNOSIS — R911 Solitary pulmonary nodule: Secondary | ICD-10-CM

## 2021-01-18 DIAGNOSIS — Z85118 Personal history of other malignant neoplasm of bronchus and lung: Secondary | ICD-10-CM

## 2021-01-27 ENCOUNTER — Telehealth: Payer: Self-pay

## 2021-01-27 MED ORDER — AMLODIPINE-OLMESARTAN 5-40 MG PO TABS: 1.0000 | ORAL_TABLET | Freq: Every day | ORAL | 1 refills | Status: DC

## 2021-01-27 NOTE — Telephone Encounter (Signed)
  LAST APPOINTMENT DATE: 10/12/2020  NEXT APPOINTMENT DATE:@4 /06/2021  MEDICATION:amLODipine-olmesartan (AZOR) 5-40 MG tablet  PHARMACY:[id number] - [place], [name] is completely out and has not had medicine in two days!

## 2021-01-27 NOTE — Telephone Encounter (Signed)
Rx sent 

## 2021-02-17 ENCOUNTER — Telehealth: Payer: Self-pay

## 2021-02-19 NOTE — Telephone Encounter (Signed)
Error

## 2021-02-24 NOTE — Patient Instructions (Addendum)
Health Maintenance Due  Topic Date Due  . COVID-19 Vaccine - consider getting booster- I think its a good idea 06/24/2020   We will switch to rosuvastatin 20 mg as does not cross blood brain barrier to same degree as atorvastatin (may be better for his memory)  Restart iron/ferrous sulfate 325 mg three timex a week   Recommended follow up: Return in about 6 months (around 08/28/2021) for physical or sooner if needed. reschedule visit for April 7th since we [place]

## 2021-02-24 NOTE — Progress Notes (Signed)
Phone [phone] In person visit   Subjective:   Jerry [name] is a 85 y.o. year old very pleasant male patient who presents for/with See problem oriented charting Chief Complaint  Patient presents with  . Dementia   This visit occurred during the SARS-CoV-2 public health emergency.  Safety protocols were in place, including screening questions prior to the visit, additional usage of staff PPE, and extensive cleaning of exam room while observing appropriate contact time as indicated for disinfecting solutions.   Past Medical History-  Patient Active Problem List   Diagnosis Date Noted  . History of lung cancer 09/18/2017    Priority: High  . Moderate dementia without behavioral disturbance ([place]) 07/17/2015    Priority: High  . Hyperlipidemia 10/15/2014    Priority: Medium  . Gout of big toe 08/04/2008    Priority: Medium  . Essential hypertension 07/25/2007    Priority: Medium  . PROSTATE CANCER, HX OF 07/25/2007    Priority: Medium  . Aortic atherosclerosis ([place]) 01/06/2018    Priority: Low  . Pulmonary emphysema ([place])     Priority: Low  . Allergic rhinitis 10/15/2014    Priority: Low  . Overactive bladder 10/15/2014    Priority: Low  . CARDIAC MURMUR 01/10/2011    Priority: Low  . ORGANIC IMPOTENCE 08/12/2010    Priority: Low  . Exertional dyspnea 03/15/2017  . Parotiditis 02/08/2017  . Facial swelling 02/08/2017  . Lung nodule 02/08/2017  . Submandibular space infection     Medications- reviewed and updated Current Outpatient Medications  Medication Sig Dispense Refill  . amLODipine-olmesartan (AZOR) 5-40 MG tablet Take 1 tablet by mouth daily. 90 tablet 1  . atorvastatin (LIPITOR) 40 MG tablet TAKE 1 TABLET(40 MG) BY MOUTH DAILY 30 tablet 3  . diclofenac Sodium (VOLTAREN) 1 % GEL Apply 2 g topically 4 (four) times daily. 100 g 3  . donepezil (ARICEPT) 10 MG tablet TAKE 1 TABLET BY MOUTH DAILY 90 tablet 1  . ferrous sulfate 325 (65 FE) MG tablet Take 325  mg by mouth daily. Patient taking three times a week (Patient not taking: No sig reported)     No current facility-administered medications for this visit.     Objective:  BP 130/78   Pulse 63   Temp (!) 97.2 F (36.2 C) (Temporal)   Ht 5\' 7"  (1.702 m)   Wt 220 lb 3.2 oz (99.9 kg)   SpO2 98%   BMI 34.49 kg/m  Gen: NAD, resting comfortably CV: RRR. Stable murmur- see echo 02/10/17, no  rubs or gallops Lungs: CTAB no crackles, wheeze, rhonchi Ext: no edema Skin: warm, dry     Assessment and Plan    # Dementia S:patient looking at adult day care with [facility]. Continues to have significant memory issues. He does take his aricept.  Sounds like some sundowning. Talks about deceased love ones A/P: overall stable- continue aricept  # anemia S: ferritin last visit pretty low at 16- anemia has been stbale in 12. Has been off iron lately.  A/P: we discussed restarting iron at this point 3x a week- they agree to add this back.   -Had visit with GI on 07/09/2019 and plan was to not work up iron deficiency further due to his age and dementia enve though there is a chane of colon cancer. Some concern about how he would do under anesthesia  #hypertension S: medication: amlodipine- olmesartan 5-40 mg BP Readings from Last 3 Encounters:  02/25/21 130/78  10/12/20 126/72  07/06/20 (!) 130/54  A/P: Stable. Continue current medications.   #hyperlipidemia/aortic atherosclerosis S: Medication:atorvastatin 40mg . Could target LDL under 70 with aortic atherosclerosis but at his age opted out of this.   Lab Results  Component Value Date   CHOL 135 10/12/2020   HDL 36 (L) 10/12/2020   LDLCALC 81 10/12/2020   LDLDIRECT 65.0 11/23/2017   TRIG 101 10/12/2020   CHOLHDL 3.8 10/12/2020   A/P: cholesterol is mildly poorly controlled considering aortic atherosclerosis but at his age and with dementia do not want to be aggressive. We will switch to rosuvastatin 20 mg as does not cross blood brain  barrier to same degree as atorvastatin (may be better for his memory). Can finish up current atorvastatin  #emphysema on problem list but no significant breathing issues- alrgely asymptomatic- continue to monitor  Recommended follow up: Return in about 6 months (around 08/28/2021) for physical or sooner if needed.  reschedule visit for April 7th since we touched base todya  Future Appointments  Date Time Provider [place]  04/01/2021  4:00 PM Marin Olp, MD [facility]  07/19/2021 11:15 AM Gery Pray, MD [facility] None    Lab/Order associations:   ICD-10-CM   1. Moderate dementia without behavioral disturbance (HCC)  F03.90   2. Hyperlipidemia, unspecified hyperlipidemia type  E78.5   3. Essential hypertension  I10   4. Pulmonary emphysema, unspecified emphysema type ([place])  J43.9     Meds ordered this encounter  Medications  . rosuvastatin (CRESTOR) 20 MG tablet    Sig: Take 1 tablet (20 mg total) by mouth daily.    Dispense:  90 tablet    Refill:  3   Return precautions advised.  Garret Reddish, MD

## 2021-02-25 ENCOUNTER — Ambulatory Visit (INDEPENDENT_AMBULATORY_CARE_PROVIDER_SITE_OTHER): Payer: Medicare Other | Admitting: Family Medicine

## 2021-02-25 ENCOUNTER — Encounter: Payer: Self-pay | Admitting: Family Medicine

## 2021-02-25 ENCOUNTER — Other Ambulatory Visit: Payer: Self-pay

## 2021-02-25 VITALS — BP 130/78 | HR 63 | Temp 97.2°F | Ht 67.0 in | Wt 220.2 lb

## 2021-02-25 DIAGNOSIS — F0390 Unspecified dementia without behavioral disturbance: Secondary | ICD-10-CM

## 2021-02-25 DIAGNOSIS — I700 Atherosclerosis of aorta: Secondary | ICD-10-CM

## 2021-02-25 DIAGNOSIS — J439 Emphysema, unspecified: Secondary | ICD-10-CM | POA: Diagnosis not present

## 2021-02-25 DIAGNOSIS — E785 Hyperlipidemia, unspecified: Secondary | ICD-10-CM | POA: Diagnosis not present

## 2021-02-25 DIAGNOSIS — I10 Essential (primary) hypertension: Secondary | ICD-10-CM | POA: Diagnosis not present

## 2021-02-25 DIAGNOSIS — F03B0 Unspecified dementia, moderate, without behavioral disturbance, psychotic disturbance, mood disturbance, and anxiety: Secondary | ICD-10-CM

## 2021-02-25 MED ORDER — ROSUVASTATIN CALCIUM 20 MG PO TABS: 20.0000 mg | ORAL_TABLET | Freq: Every day | ORAL | 3 refills | Status: DC

## 2021-02-26 ENCOUNTER — Encounter: Payer: Self-pay | Admitting: Family Medicine

## 2021-03-18 ENCOUNTER — Telehealth: Payer: Self-pay

## 2021-03-18 NOTE — Telephone Encounter (Signed)
Pt daughter called asking to speak with medical assistant. She has some questions that she needs to discuss. Please advise. Please call Jerry [name]. [phone]

## 2021-03-18 NOTE — Telephone Encounter (Signed)
Lm for [employer].

## 2021-03-19 NOTE — Telephone Encounter (Signed)
Spoke with patient's daughter. We addressed a lot of her concerns in regards to her father and GI. Dr hunter is aware of the conversation we had. The daughter states that she will give [place] a call back once she figure out what she wants to do .

## 2021-03-22 ENCOUNTER — Ambulatory Visit (INDEPENDENT_AMBULATORY_CARE_PROVIDER_SITE_OTHER): Payer: Medicare Other

## 2021-03-22 ENCOUNTER — Ambulatory Visit: Payer: Medicare Other

## 2021-03-22 DIAGNOSIS — Z0000 Encounter for general adult medical examination without abnormal findings: Secondary | ICD-10-CM

## 2021-03-22 NOTE — Progress Notes (Addendum)
.Virtual Visit via Telephone Note  I connected with  Jerry [name] on 03/23/21 at  3:15 PM EDT by telephone and verified that I am speaking with the correct person using two identifiers and [name]/ caretaker Due to pt having dementia she completed AWV with pt in home.  Location: Patient: Home Provider: Office  Persons participating in the virtual visit: patient/Nurse Health Advisor and [name]    I discussed the limitations, risks, security and privacy concerns of performing an evaluation and management service by telephone and the availability of in person appointments. The patient expressed understanding and agreed to proceed.  Interactive audio and video telecommunications were attempted between this nurse and patient, however failed, due to patient having technical difficulties OR patient did not have access to video capability.  We continued and completed visit with audio only.  Some vital signs may be absent or patient reported.   Willette Brace, LPN    Subjective:   Jerry [name] is a 85 y.o. male who presents for Medicare Annual/Subsequent preventive examination with [name] at his side   Review of Systems     Cardiac Risk Factors include: advanced age (>62men, >70 women);hypertension;dyslipidemia;male gender;obesity (BMI >30kg/m2)     Objective:    There were no vitals filed for this visit. There is no height or weight on file to calculate BMI.  Advanced Directives 03/22/2021 07/06/2020 05/20/2020 12/26/2019 06/20/2019 10/18/2018 04/12/2018  Does Patient Have a Medical Advance Directive? No No Yes No No Yes No  Type of Advance Directive - - [place];Living will -  Does patient want to make changes to medical advance directive? - No - Patient declined - - - - -  Copy of [place] in Chart? - - No - copy requested - - No - copy requested -  Would patient like information on  creating a medical advance directive? No - Patient declined No - Patient declined - - - - -    Current Medications (verified) Outpatient Encounter Medications as of 03/22/2021  Medication Sig  . amLODipine-olmesartan (AZOR) 5-40 MG tablet Take 1 tablet by mouth daily.  Marland Kitchen atorvastatin (LIPITOR) 40 MG tablet Take 40 mg by mouth daily.  . diclofenac Sodium (VOLTAREN) 1 % GEL Apply 2 g topically 4 (four) times daily.  Marland Kitchen donepezil (ARICEPT) 10 MG tablet TAKE 1 TABLET BY MOUTH DAILY  . Multiple Vitamin (MULTIVITAMIN) tablet Take 1 tablet by mouth daily. Centrum silver  . ferrous sulfate 325 (65 FE) MG tablet Take 325 mg by mouth daily. Patient taking three times a week (Patient not taking: No sig reported)  . rosuvastatin (CRESTOR) 20 MG tablet Take 1 tablet (20 mg total) by mouth daily. (Patient not taking: Reported on 03/22/2021)   No facility-administered encounter medications on file as of 03/22/2021.    Allergies (verified) Namenda [memantine]   History: Past Medical History:  Diagnosis Date  . Cancer of prostate ([place]) 2001  . Chronic kidney disease (CKD), stage III (moderate) ([place])    [name] 02/08/2017  . Dementia ([place])   . DIVERTICULOSIS, COLON 07/25/2007   Qualifier: Diagnosis of  By: [name], LPN, [name]   . High cholesterol   . History of radiation therapy 05/01/17-05/09/17   right lung 54 Gy in 3 fractions  . Hypertension   . Lung cancer ([place]) 03/2017   Past Surgical History:  Procedure Laterality Date  . DIRECT LARYNGOSCOPY N/A 06/21/2017  Procedure: DIRECT LARYNGOSCOPY;  Surgeon: Melissa Montane, MD;  Location: [facility];  Service: ENT;  Laterality: N/A;  . EYE SURGERY    . INSERTION PROSTATE RADIATION SEED  07/2000   [name] 05/09/2011  . PENILE PROSTHESIS IMPLANT  02/2002   Insertion of Mentor three piece inflatable penile prosthesis./notes 05/09/2011  . PENILE PROSTHESIS PLACEMENT  06/2003   Placement of Mentor rod prosthesis/notes 05/09/2011  . REFRACTIVE SURGERY Bilateral    . REMOVAL OF PENILE PROSTHESIS  06/2003   Removal of three-piece Mentor prosthesis/notes 05/09/2011  . SCROTAL SURGERY  06/2003   Repair of scrotal defect/notes 05/09/2011   Family History  Problem Relation Age of Onset  . Early death Father        cirrhosis  . Alcoholism Father   . Cirrhosis Other   . Breast cancer Sister    Social History   Socioeconomic History  . Marital status: Widowed    Spouse name: Not on file  . Number of children: Not on file  . Years of education: Not on file  . Highest education level: Not on file  Occupational History  . Not on file  Tobacco Use  . Smoking status: Former Smoker    Packs/day: 0.50    Quit date: 12/26/1988    Years since quitting: 32.2  . Smokeless tobacco: Never Used  . Tobacco comment: "I don't remember how long really" (02/08/2017)  Vaping Use  . Vaping Use: Never used  Substance and Sexual Activity  . Alcohol use: No  . Drug use: No  . Sexual activity: Not Currently  Other Topics Concern  . Not on file  Social History Narrative   ** Merged History Encounter **       Separated since 2010. 4 kids. 4 grandkids. 2 greatgrandkids.   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 Dementia unfortunately stopped these travels.    Social Determinants of Health   Financial Resource Strain: Low Risk   . Difficulty of Paying Living Expenses: Not hard at all  Food Insecurity: No Food Insecurity  . Worried About Charity fundraiser in the Last Year: Never true  . Ran Out of Food in the Last Year: Never true  Transportation Needs: No Transportation Needs  . Lack of Transportation (Medical): No  . Lack of Transportation (Non-Medical): No  Physical Activity: Inactive  . Days of Exercise per Week: 0 days  . Minutes of Exercise per Session: 0 min  Stress: No Stress Concern Present  . Feeling of Stress : Only a little  Social Connections: Socially Isolated  . Frequency of Communication with  Friends and Family: Never  . Frequency of Social Gatherings with Friends and Family: Once a week  . Attends Religious Services: 1 to 4 times per year  . Active Member of Clubs or Organizations: No  . Attends Archivist Meetings: Never  . Marital Status: Widowed    Tobacco Counseling Counseling given: Not Answered Comment: "I don't remember how long really" (02/08/2017)   Clinical Intake:  Pre-visit preparation completed: Yes  Pain : No/denies pain     BMI - recorded: 34.49 Nutritional Status: BMI > 30  Obese Nutritional Risks: None Diabetes: No  How often do you need to have someone help you when you read instructions, pamphlets, or other written materials from your doctor or pharmacy?: 1 - Never  Diabetic?no  Interpreter Needed?: No  Information entered by :: Charlott Rakes, LPN  Activities of Daily Living In your present state of health, do you have any difficulty performing the following activities: 03/22/2021 05/07/2020  Hearing? N N  Vision? N N  Difficulty concentrating or making decisions? Y Y  Comment dementia trouble concentrating  Walking or climbing stairs? N N  Dressing or bathing? N N  Comment cothes set out for him -  Doing errands, shopping? N N  Preparing Food and eating ? Y -  Using the Toilet? N -  In the past six months, have you accidently leaked urine? Y -  Comment at times more nightly -  Do you have problems with loss of bowel control? Y -  Managing your Medications? N -  Managing your Finances? N -  Housekeeping or managing your Housekeeping? N -  Some recent data might be hidden    Patient Care Team: Marin Olp, MD as PCP - General (Family Medicine)  Indicate any recent Medical Services you may have received from other than [facility] providers in the past year (date may be approximate).     Assessment:   This is a routine wellness examination for [place].  Hearing/Vision screen  Hearing Screening   125Hz  250Hz  500Hz   1000Hz  2000Hz  3000Hz  4000Hz  6000Hz  8000Hz   Right ear:           Left ear:           Comments: Pt denies any hearing issues   Vision Screening Comments: Pt follows up with Dr Marylynn Pearson for annual exams   Dietary issues and exercise activities discussed: Current Exercise Habits: The patient does not participate in regular exercise at present  Goals    . patient     Alzheimer's Association / Family information and training  https://www.clark-whitaker.org/.asp Loss adjuster, chartered for information  Driving resource center; Can send out driving contract    SAFETY; TeleconferenceOnDemand.fr.asp#howdementiaaffects  [place]  (please mail donations to this address) [address]  [place], [place] [address] P: [phone] F: [place], [place] [address] P: [phone] F: [address], [address] [place], [place] [address] P: [phone] F: [phone]  Families to call the 800 number to get information on all the resources in the area 24 hour 1 [phone]  Can provide resources; Questions about Dementia; Support groups; Give the caregiver information regarding respite ([facility]ult [facility] for Enrichment) Call the [place] on Aging;as they oversee who gets the grant fund for certain areas (696-789-3810) Also have Tools to help navigate the needs of the patient  Opportunities for free educational programs online 100 support groups [place];  Just starting Early stage program and care partners for the patient and the family 12 weeks of a support group;  12 weeks of social engagement component End with Educational wrap up [place] the 800 number given for more information  All's connected  Navigator for those more comfortable with navigating the internet and allows one to develop a plan based on resources;  Website and review the educational  [place] for programs that are available.  They do care consultations with appointments  Packets for physician offices/  Packets physicians can give to newly dx patients Early stage flyers  Also have examples of Safety services and jewelry   [place], [place] [address] P: [phone] F: [phone]  Look up Derrel Nip online; she is an Administrator, arts on memory issues  BankingBets.fi  [employer]; [phone] Management consultant; Information regarding [place]  Diabetes and weight loss; Diabetes [facility]  Phone: [phone]   [place] Resources; 727-729-8265 / call here for transportation in [place] / [place]; [phone] Get resource to get information on any and all community programs for Seniors  [place]: 6503438993 Community Health Response Program -[phone] Public Health Dept; Need to be a skilled visit but can assist with bathing as well; 3400598036  Adult center for Enrichment;  Call Senior Line; [phone]  Adult day services include Adult Day Care, Adult Day Healthcare, Group Respite, Care Partners, Volunteer In [employer], Education and Support Program   Dept of Social Services; Call [phone] and ask for SW on call  Options for Medicaid include the Community Alternatives program; [place]-PCS.org (personal care services) or PACE program, which is a medical and social program combined   Caregiver support group and information regarding [place] is at the; [facility] Address: [address] [place], [place] [address]  Phone: [phone]   ----        . Patient Stated     None at this time      Depression Screen PHQ 2/9 Scores 03/22/2021 02/25/2021 10/12/2020 09/12/2019 11/07/2018 01/11/2018 09/18/2017  PHQ - 2 Score 1 0 0 2 0 0 0  PHQ- 9 Score - - - 8 - - -    Fall Risk Fall Risk  03/22/2021 02/25/2021 05/07/2020 09/12/2019  11/07/2018  Falls in the past year? 0 0 0 - 0  Number falls in past yr: 0 0 0 0 -  Injury with Fall? 0 0 - 0 -  Risk for fall due to : Impaired vision - - - -  Follow up Falls prevention discussed - Falls evaluation completed;Education provided - -    FALL RISK PREVENTION PERTAINING TO THE HOME:  Any stairs in or around the home? Yes  If so, are there any without handrails? No  Home free of loose throw rugs in walkways, pet beds, electrical cords, etc? Yes  Adequate lighting in your home to reduce risk of falls? Yes   ASSISTIVE DEVICES UTILIZED TO PREVENT FALLS:  Life alert? No  Use of a cane, walker or w/c? No  Grab bars in the bathroom? No  Shower chair or bench in shower? Yes  Elevated toilet seat or a handicapped toilet? No   TIMED UP AND GO:  Was the test performed? No   Cognitive Function: 6CIT declined at this time MMSE - Mini Mental State Exam 03/23/2017 09/26/2016 07/02/2015  Orientation to time 0 2 2  Orientation to Place 5 4 5   Registration 3 3 3   Attention/ Calculation 1 0 1  Recall 0 0 0  Language- name 2 objects 2 2 2   Language- repeat 1 1 1   Language- follow 3 step command 3 2 3   Language- read & follow direction 1 1 1   Write a sentence 1 1 1   Copy design 0 1 1  Total score 17 17 20         Immunizations Immunization History  Administered Date(s) Administered  . Fluad Quad(high Dose 65+) 10/12/2020  . Influenza Whole 11/06/2007, 10/07/2008, 12/09/2010  . Influenza, High Dose Seasonal PF 08/22/2016, 11/07/2018  . Influenza,inj,Quad PF,6+ Mos 10/15/2014  . PFIZER(Purple Top)SARS-COV-2 Vaccination 05/07/2020, 05/27/2020  . Pneumococcal Conjugate-13 05/08/2015  . Pneumococcal Polysaccharide-23 11/06/2007  . Td 12/26/2006, 05/03/2018    TDAP status: Due, Education has been provided regarding the importance of this vaccine. Advised may receive this  vaccine at local pharmacy or Health Dept. Aware to provide a copy of the vaccination record if obtained from  local pharmacy or Health Dept. Verbalized acceptance and understanding.  Flu Vaccine status: Up to date  Pneumococcal vaccine status: Up to date  Covid-19 vaccine status: Completed vaccines  Qualifies for Shingles Vaccine? Yes   Zostavax completed No   Shingrix Completed?: No.    Education has been provided regarding the importance of this vaccine. Patient has been advised to call insurance company to determine out of pocket expense if they have not yet received this vaccine. Advised may also receive vaccine at local pharmacy or Health Dept. Verbalized acceptance and understanding.  Screening Tests Health Maintenance  Topic Date Due  . COVID-19 Vaccine (3 - Pfizer risk 4-dose series) 06/24/2020  . TETANUS/TDAP  05/03/2028  . INFLUENZA VACCINE  Completed  . PNA vac Low Risk Adult  Completed  . HPV VACCINES  Aged Out    Health Maintenance  Health Maintenance Due  Topic Date Due  . COVID-19 Vaccine (3 - Pfizer risk 4-dose series) 06/24/2020    Colorectal cancer screening: No longer required.    Additional Screening:   Vision Screening: Recommended annual ophthalmology exams for early detection of glaucoma and other disorders of the eye. Is the patient up to date with their annual eye exam?  No  Who is the provider or what is the name of the office in which the patient attends annual eye exams? Dr Marylynn Pearson need to make appt  If pt is not established with a provider, would they like to be referred to a provider to establish care? No .   Dental Screening: Recommended annual dental exams for proper oral hygiene  Community Resource Referral / Chronic Care Management: CRR required this visit?  No   CCM required this visit?  No      Plan:     I have personally reviewed and noted the following in the patient's chart:   . Medical and social history . Use of alcohol, tobacco or illicit drugs  . Current medications and supplements . Functional ability and  status . Nutritional status . Physical activity . Advanced directives . List of other physicians . Hospitalizations, surgeries, and ER visits in previous 12 months . Vitals . Screenings to include cognitive, depression, and falls . Referrals and appointments  In addition, I have reviewed and discussed with patient/ [name] certain preventive protocols, quality metrics, and best practice recommendations. A written personalized care plan for preventive services as well as general preventive health recommendations were provided to patient.     Willette Brace, LPN   0/93/2355   Nurse Notes: Pt was not in the mood to do a virtual visit or take a shower today, [name] did AWV based on knowledge

## 2021-03-22 NOTE — Patient Instructions (Signed)
Jerry [name] , Thank you for taking time to come for your Medicare Wellness Visit. I appreciate your ongoing commitment to your health goals. Please review the following plan we discussed and let me know if I can assist you in the future.   Screening recommendations/referrals: Colonoscopy: No longer required Recommended yearly ophthalmology/optometry visit for glaucoma screening and checkup Recommended yearly dental visit for hygiene and checkup  Vaccinations: Influenza vaccine: Up to date Pneumococcal vaccine: Up to date Tdap vaccine: Up to date Shingles vaccine: Shingrix discussed. Please contact your pharmacy for coverage information.    Covid-19: Completed 5/13 & 05/27/20  Advanced directives: Advance directive discussed with you today. Even though you declined this today please call our office should you change your mind and we can give you the proper paperwork for you to fill out.  Conditions/risks identified: Completed 5/13 & 05/27/20  Next appointment: Follow up in one year for your annual wellness visit.   Preventive Care 28 Years and Older, Male Preventive care refers to lifestyle choices and visits with your health care provider that can promote health and wellness. What does preventive care include?  A yearly physical exam. This is also called an annual well check.  Dental exams once or twice a year.  Routine eye exams. Ask your health care provider how often you should have your eyes checked.  Personal lifestyle choices, including:  Daily care of your teeth and gums.  Regular physical activity.  Eating a healthy diet.  Avoiding tobacco and drug use.  Limiting alcohol use.  Practicing safe sex.  Taking low doses of aspirin every day.  Taking vitamin and mineral supplements as recommended by your health care provider. What happens during an annual well check? The services and screenings done by your health care provider during your annual well check will depend  on your age, overall health, lifestyle risk factors, and family history of disease. Counseling  Your health care provider may ask you questions about your:  Alcohol use.  Tobacco use.  Drug use.  Emotional well-being.  Home and relationship well-being.  Sexual activity.  Eating habits.  History of falls.  Memory and ability to understand (cognition).  Work and work Statistician. Screening  You may have the following tests or measurements:  Height, weight, and BMI.  Blood pressure.  Lipid and cholesterol levels. These may be checked every 5 years, or more frequently if you are over 72 years old.  Skin check.  Lung cancer screening. You may have this screening every year starting at age 88 if you have a 30-pack-year history of smoking and currently smoke or have quit within the past 15 years.  Fecal occult blood test (FOBT) of the stool. You may have this test every year starting at age 26.  Flexible sigmoidoscopy or colonoscopy. You may have a sigmoidoscopy every 5 years or a colonoscopy every 10 years starting at age 56.  Prostate cancer screening. Recommendations will vary depending on your family history and other risks.  Hepatitis C blood test.  Hepatitis B blood test.  Sexually transmitted disease (STD) testing.  Diabetes screening. This is done by checking your blood sugar (glucose) after you have not eaten for a while (fasting). You may have this done every 1-3 years.  Abdominal aortic aneurysm (AAA) screening. You may need this if you are a current or former smoker.  Osteoporosis. You may be screened starting at age 48 if you are at high risk. Talk with your health care provider about your  test results, treatment options, and if necessary, the need for more tests. Vaccines  Your health care provider may recommend certain vaccines, such as:  Influenza vaccine. This is recommended every year.  Tetanus, diphtheria, and acellular pertussis (Tdap, Td)  vaccine. You may need a Td booster every 10 years.  Zoster vaccine. You may need this after age 49.  Pneumococcal 13-valent conjugate (PCV13) vaccine. One dose is recommended after age 53.  Pneumococcal polysaccharide (PPSV23) vaccine. One dose is recommended after age 57. Talk to your health care provider about which screenings and vaccines you need and how often you need them. This information is not intended to replace advice given to you by your health care provider. Make sure you discuss any questions you have with your health care provider. Document Released: 01/08/2016 Document Revised: 08/31/2016 Document Reviewed: 10/13/2015 Elsevier Interactive Patient Education  2017 [place] Prevention in the Home Falls can cause injuries. They can happen to people of all ages. There are many things you can do to make your home safe and to help prevent falls. What can I do on the outside of my home?  Regularly fix the edges of walkways and driveways and fix any cracks.  Remove anything that might make you trip as you walk through a door, such as a raised step or threshold.  Trim any bushes or trees on the path to your home.  Use bright outdoor lighting.  Clear any walking paths of anything that might make someone trip, such as rocks or tools.  Regularly check to see if handrails are loose or broken. Make sure that both sides of any steps have handrails.  Any raised decks and porches should have guardrails on the edges.  Have any leaves, snow, or ice cleared regularly.  Use sand or salt on walking paths during winter.  Clean up any spills in your garage right away. This includes oil or grease spills. What can I do in the bathroom?  Use night lights.  Install grab bars by the toilet and in the tub and shower. Do not use towel bars as grab bars.  Use non-skid mats or decals in the tub or shower.  If you need to sit down in the shower, use a plastic, non-slip  stool.  Keep the floor dry. Clean up any water that spills on the floor as soon as it happens.  Remove soap buildup in the tub or shower regularly.  Attach bath mats securely with double-sided non-slip rug tape.  Do not have throw rugs and other things on the floor that can make you trip. What can I do in the bedroom?  Use night lights.  Make sure that you have a light by your bed that is easy to reach.  Do not use any sheets or blankets that are too big for your bed. They should not hang down onto the floor.  Have a firm chair that has side arms. You can use this for support while you get dressed.  Do not have throw rugs and other things on the floor that can make you trip. What can I do in the kitchen?  Clean up any spills right away.  Avoid walking on wet floors.  Keep items that you use a lot in easy-to-reach places.  If you need to reach something above you, use a strong step stool that has a grab bar.  Keep electrical cords out of the way.  Do not use floor polish or wax that  makes floors slippery. If you must use wax, use non-skid floor wax.  Do not have throw rugs and other things on the floor that can make you trip. What can I do with my stairs?  Do not leave any items on the stairs.  Make sure that there are handrails on both sides of the stairs and use them. Fix handrails that are broken or loose. Make sure that handrails are as long as the stairways.  Check any carpeting to make sure that it is firmly attached to the stairs. Fix any carpet that is loose or worn.  Avoid having throw rugs at the top or bottom of the stairs. If you do have throw rugs, attach them to the floor with carpet tape.  Make sure that you have a light switch at the top of the stairs and the bottom of the stairs. If you do not have them, ask someone to add them for you. What else can I do to help prevent falls?  Wear shoes that:  Do not have high heels.  Have rubber bottoms.  Are  comfortable and fit you well.  Are closed at the toe. Do not wear sandals.  If you use a stepladder:  Make sure that it is fully opened. Do not climb a closed stepladder.  Make sure that both sides of the stepladder are locked into place.  Ask someone to hold it for you, if possible.  Clearly mark and make sure that you can see:  Any grab bars or handrails.  First and last steps.  Where the edge of each step is.  Use tools that help you move around (mobility aids) if they are needed. These include:  Canes.  Walkers.  Scooters.  Crutches.  Turn on the lights when you go into a dark area. Replace any light bulbs as soon as they burn out.  Set up your furniture so you have a clear path. Avoid moving your furniture around.  If any of your floors are uneven, fix them.  If there are any pets around you, be aware of where they are.  Review your medicines with your doctor. Some medicines can make you feel dizzy. This can increase your chance of falling. Ask your doctor what other things that you can do to help prevent falls. This information is not intended to replace advice given to you by your health care provider. Make sure you discuss any questions you have with your health care provider. Document Released: 10/08/2009 Document Revised: 05/19/2016 Document Reviewed: 01/16/2015 Elsevier Interactive Patient Education  2017 [employer].

## 2021-03-25 ENCOUNTER — Emergency Department (HOSPITAL_COMMUNITY)
Admission: EM | Admit: 2021-03-25 | Discharge: 2021-03-25 | Disposition: A | Discharge status: Home / Self Care | Payer: Medicare Other | Attending: Emergency Medicine | Admitting: Emergency Medicine

## 2021-03-25 ENCOUNTER — Telehealth: Payer: Self-pay

## 2021-03-25 ENCOUNTER — Emergency Department (HOSPITAL_COMMUNITY): Payer: Medicare Other

## 2021-03-25 DIAGNOSIS — N183 Chronic kidney disease, stage 3 unspecified: Secondary | ICD-10-CM | POA: Diagnosis not present

## 2021-03-25 DIAGNOSIS — R410 Disorientation, unspecified: Secondary | ICD-10-CM | POA: Diagnosis not present

## 2021-03-25 DIAGNOSIS — Z79899 Other long term (current) drug therapy: Secondary | ICD-10-CM | POA: Insufficient documentation

## 2021-03-25 DIAGNOSIS — I129 Hypertensive chronic kidney disease with stage 1 through stage 4 chronic kidney disease, or unspecified chronic kidney disease: Secondary | ICD-10-CM | POA: Insufficient documentation

## 2021-03-25 DIAGNOSIS — Z8546 Personal history of malignant neoplasm of prostate: Secondary | ICD-10-CM | POA: Diagnosis not present

## 2021-03-25 DIAGNOSIS — Z85118 Personal history of other malignant neoplasm of bronchus and lung: Secondary | ICD-10-CM | POA: Insufficient documentation

## 2021-03-25 DIAGNOSIS — I10 Essential (primary) hypertension: Secondary | ICD-10-CM | POA: Diagnosis not present

## 2021-03-25 DIAGNOSIS — R9082 White matter disease, unspecified: Secondary | ICD-10-CM | POA: Diagnosis not present

## 2021-03-25 DIAGNOSIS — R4182 Altered mental status, unspecified: Secondary | ICD-10-CM | POA: Diagnosis present

## 2021-03-25 DIAGNOSIS — F0390 Unspecified dementia without behavioral disturbance: Secondary | ICD-10-CM | POA: Diagnosis not present

## 2021-03-25 DIAGNOSIS — Z87891 Personal history of nicotine dependence: Secondary | ICD-10-CM | POA: Insufficient documentation

## 2021-03-25 LAB — COMPREHENSIVE METABOLIC PANEL
ALT: 35 U/L (ref 0–44)
AST: 27 U/L (ref 15–41)
Albumin: 3.3 g/dL — ABNORMAL LOW (ref 3.5–5.0)
Alkaline Phosphatase: 66 U/L (ref 38–126)
Anion gap: 5 (ref 5–15)
BUN: 14 mg/dL (ref 8–23)
CO2: 26 mmol/L (ref 22–32)
Calcium: 9 mg/dL (ref 8.9–10.3)
Chloride: 110 mmol/L (ref 98–111)
Creatinine, Ser: 1.33 mg/dL — ABNORMAL HIGH (ref 0.61–1.24)
GFR, Estimated: 53 mL/min — ABNORMAL LOW (ref >60)
Glucose, Bld: 108 mg/dL — ABNORMAL HIGH (ref 70–99)
Potassium: 3.7 mmol/L (ref 3.5–5.1)
Sodium: 141 mmol/L (ref 135–145)
Total Bilirubin: 0.7 mg/dL (ref 0.3–1.2)
Total Protein: 6.7 g/dL (ref 6.5–8.1)

## 2021-03-25 LAB — URINALYSIS, ROUTINE W REFLEX MICROSCOPIC
Bilirubin Urine: NEGATIVE
Glucose, UA: NEGATIVE mg/dL
Hgb urine dipstick: NEGATIVE
Ketones, ur: NEGATIVE mg/dL
Leukocytes,Ua: NEGATIVE
Nitrite: NEGATIVE
Protein, ur: NEGATIVE mg/dL
Specific Gravity, Urine: 1.019 (ref 1.005–1.030)
pH: 5 (ref 5.0–8.0)

## 2021-03-25 LAB — CBC
HCT: 37.5 % — ABNORMAL LOW (ref 39.0–52.0)
Hemoglobin: 11.7 g/dL — ABNORMAL LOW (ref 13.0–17.0)
MCH: 28 pg (ref 26.0–34.0)
MCHC: 31.2 g/dL (ref 30.0–36.0)
MCV: 89.7 fL (ref 80.0–100.0)
Platelets: 215 10*3/uL (ref 150–400)
RBC: 4.18 MIL/uL — ABNORMAL LOW (ref 4.22–5.81)
RDW: 14.8 % (ref 11.5–15.5)
WBC: 7.7 10*3/uL (ref 4.0–10.5)
nRBC: 0 % (ref 0.0–0.2)

## 2021-03-25 LAB — AMMONIA: Ammonia: 18 umol/L (ref 9–35)

## 2021-03-25 LAB — CBG MONITORING, ED: Glucose-Capillary: 108 mg/dL — ABNORMAL HIGH (ref 70–99)

## 2021-03-25 NOTE — Telephone Encounter (Signed)
Nurse Assessment Nurse: Raenette Rover, RN, Zella Ball Date/Time Eilene Ghazi Time): 03/25/2021 8:43:45 AM Confirm and document reason for call. If symptomatic, describe symptoms. ---Transferred from the office. Caller states her dad has dementia but is more confused today and his PCP wants him to be triaged. Asked her where was he. When he got up this morning he was not sure where he was. Yesterday he did not understand how he got there, but they never left the house. Does not understand why he is so confused. Does the patient have any new or worsening symptoms? ---Yes Will a triage be completed? ---Yes Related visit to physician within the last 2 weeks? ---No Does the PT have any chronic conditions? (i.e. diabetes, asthma, this includes High risk factors for pregnancy, etc.) ---Yes List chronic conditions. ---Dementia, htn, cholesterol Is this a behavioral health or substance abuse call? ---No PLEASE NOTE: All timestamps contained within this report are represented as [place] Standard Time. CONFIDENTIALTY NOTICE: This fax transmission is intended only for the addressee. It contains information that is legally privileged, confidential or otherwise protected from use or disclosure. If you are not the intended recipient, you are strictly prohibited from reviewing, disclosing, copying using or disseminating any of this information or taking any action in reliance on or regarding this information. If you have received this fax in error, please notify us immediately by telephone so that we can arrange for its return to [place]. Phone: [phone], Toll-Free: [phone], Fax: [phone] Page: 2 of 2 Call Id: [id number] Guidelines Guideline Title Affirmed Question Affirmed Notes Nurse Date/Time Eilene Ghazi Time) Confusion - Delirium [1] Longstanding confusion (e.g., dementia, stroke) AND [2] worsening Lahoma Crocker 03/25/2021 8:45:11 AM Disp. Time Eilene Ghazi Time) Disposition Final User 03/25/2021  8:42:06 AM Send to Urgent Queue Schlegelmilch, Apolonio Schneiders 03/25/2021 8:52:34 AM See HCP within 4 Hours (or PCP triage) Yes Raenette Rover, RN, Herbert Deaner Disagree/Comply Comply Caller Understands Yes PreDisposition Did not know what to do Care Advice Given Per Guideline SEE HCP (OR PCP TRIAGE) WITHIN 4 HOURS: * UCC: Some UCCs can manage patients who are stable and have less serious symptoms (e.g., minor illnesses and injuries). The triager must know the [facility] capabilities before sending a patient there. If unsure, call ahead. CALL BACK IF: * You become worse CARE ADVICE given per Confusion-Delirium (Adult) guideline. Comments User: Wilson Singer, RN Date/Time Eilene Ghazi Time): 03/25/2021 8:49:24 AM Last night had a hamburger, Hasn't eaten or drank anything other than water with his meds this morning. User: Wilson Singer, RN Date/Time Eilene Ghazi Time): 03/25/2021 8:54:43 AM Call was transferred to [place] due to no appointments recommended ER Referrals GO TO FACILITY OTHER - SPECIFY

## 2021-03-25 NOTE — Telephone Encounter (Signed)
Patient's daughter called back, after speaking with Dr.Hunter it would be best that patient go to the ED or Urgent Care. Daughter said she is going to take him to the ED.

## 2021-03-25 NOTE — Telephone Encounter (Signed)
Noted  

## 2021-03-25 NOTE — ED Provider Notes (Signed)
[facility] EMERGENCY DEPARTMENT Provider Note   CSN: [id number] Arrival date & time: 03/25/21  1035     History Chief Complaint  Patient presents with  . Altered Mental Status    Jerry [name] is a 85 y.o. male with pertinent past medical history of moderate dementia, CKD stage III, hypercholesteremia, hypertension, prostate cancer that presents the emerge department today for altered mental status by daughter.  Patient is level 5 caveat due to altered mental status.  Daughter states that she thinks this is different than his typical dementia.  States that with his typical dementia he is normally confused about his deceased family members, however yesterday around this time she noticed that he kept asking where he was and he was confused about being in 2 places at once.  She states that he keeps repeating this.  She states that this lasted up until night and then he went to bed and then it started again this morning.  Denies any vomiting, fevers, diarrhea, rashes, cough.  She states that she primarily takes care of him, was in his normal health before this.  When I asked the patient where he is he knows he is in a hospital, knows he is in [place].  He states that he does not " keep up with" the year or the month.  He is alert to himself, he thinks he is here to get his teeth brushed.  Per chart review patient saw primary care physician Dr. Yong Channel this month, at the time he noted that he was still having significant memory issues, has been compliant with his Aricept.  Dr. Yong Channel also noticed that it sounded like he was sundowning.  HPI     Past Medical History:  Diagnosis Date  . Cancer of prostate ([place]) 2001  . Chronic kidney disease (CKD), stage III (moderate) ([place])    [name] 02/08/2017  . Dementia ([place])   . DIVERTICULOSIS, COLON 07/25/2007   Qualifier: Diagnosis of  By: [name], LPN, [name]   . High cholesterol   . History of radiation therapy 05/01/17-05/09/17    right lung 54 Gy in 3 fractions  . Hypertension   . Lung cancer ([place]) 03/2017    Patient Active Problem List   Diagnosis Date Noted  . Aortic atherosclerosis ([place]) 01/06/2018  . History of lung cancer 09/18/2017  . Exertional dyspnea 03/15/2017  . Pulmonary emphysema ([place])   . Parotiditis 02/08/2017  . Facial swelling 02/08/2017  . Lung nodule 02/08/2017  . Submandibular space infection   . Moderate dementia without behavioral disturbance ([place]) 07/17/2015  . Hyperlipidemia 10/15/2014  . Allergic rhinitis 10/15/2014  . Overactive bladder 10/15/2014  . CARDIAC MURMUR 01/10/2011  . ORGANIC IMPOTENCE 08/12/2010  . Gout of big toe 08/04/2008  . Essential hypertension 07/25/2007  . PROSTATE CANCER, HX OF 07/25/2007    Past Surgical History:  Procedure Laterality Date  . DIRECT LARYNGOSCOPY N/A 06/21/2017   Procedure: DIRECT LARYNGOSCOPY;  Surgeon: Melissa Montane, MD;  Location: [facility];  Service: ENT;  Laterality: N/A;  . EYE SURGERY    . INSERTION PROSTATE RADIATION SEED  07/2000   [name] 05/09/2011  . PENILE PROSTHESIS IMPLANT  02/2002   Insertion of Mentor three piece inflatable penile prosthesis./notes 05/09/2011  . PENILE PROSTHESIS PLACEMENT  06/2003   Placement of Mentor rod prosthesis/notes 05/09/2011  . REFRACTIVE SURGERY Bilateral   . REMOVAL OF PENILE PROSTHESIS  06/2003   Removal of three-piece Mentor prosthesis/notes 05/09/2011  . SCROTAL SURGERY  06/2003   Repair of scrotal defect/notes 05/09/2011       Family History  Problem Relation Age of Onset  . Early death Father        cirrhosis  . Alcoholism Father   . Cirrhosis Other   . Breast cancer Sister     Social History   Tobacco Use  . Smoking status: Former Smoker    Packs/day: 0.50    Quit date: 12/26/1988    Years since quitting: 32.2  . Smokeless tobacco: Never Used  . Tobacco comment: "I don't remember how long really" (02/08/2017)  Vaping Use  . Vaping Use: Never used  Substance Use Topics  .  Alcohol use: No  . Drug use: No    Home Medications Prior to Admission medications   Medication Sig Start Date End Date Taking? Authorizing Provider  amLODipine-olmesartan (AZOR) 5-40 MG tablet Take 1 tablet by mouth daily. 01/27/21   Marin Olp, MD  atorvastatin (LIPITOR) 40 MG tablet Take 40 mg by mouth daily. 03/21/21   [provider]  diclofenac Sodium (VOLTAREN) 1 % GEL Apply 2 g topically 4 (four) times daily. 10/12/20   Marin Olp, MD  donepezil (ARICEPT) 10 MG tablet TAKE 1 TABLET BY MOUTH DAILY 10/05/20   Marin Olp, MD  ferrous sulfate 325 (65 FE) MG tablet Take 325 mg by mouth daily. Patient taking three times a week Patient not taking: No sig reported    [provider]  Multiple Vitamin (MULTIVITAMIN) tablet Take 1 tablet by mouth daily. Centrum silver    [provider]  rosuvastatin (CRESTOR) 20 MG tablet Take 1 tablet (20 mg total) by mouth daily. Patient not taking: Reported on 03/22/2021 02/25/21   Marin Olp, MD    Allergies    Namenda [memantine]  Review of Systems   Review of Systems  Unable to perform ROS: Mental status change    Physical Exam Updated Vital Signs BP (!) 131/53   Pulse 71   Temp 99 F (37.2 C) (Oral)   Resp (!) 21   SpO2 100%   Physical Exam Constitutional:      General: He is not in acute distress.    Appearance: Normal appearance. He is not ill-appearing, toxic-appearing or diaphoretic.  HENT:     Mouth/Throat:     Mouth: Mucous membranes are moist.     Pharynx: Oropharynx is clear.  Eyes:     General: No scleral icterus.    Extraocular Movements: Extraocular movements intact.     Pupils: Pupils are equal, round, and reactive to light.  Cardiovascular:     Rate and Rhythm: Normal rate and regular rhythm.     Pulses: Normal pulses.     Heart sounds: Normal heart sounds.  Pulmonary:     Effort: Pulmonary effort is normal. No respiratory distress.     Breath sounds: Normal  breath sounds. No stridor. No wheezing, rhonchi or rales.  Chest:     Chest wall: No tenderness.  Abdominal:     General: Abdomen is flat. There is no distension.     Palpations: Abdomen is soft.     Tenderness: There is no abdominal tenderness. There is no guarding or rebound.  Musculoskeletal:        General: No swelling or tenderness. Normal range of motion.     Cervical back: Normal range of motion and neck supple. No rigidity.     Right lower leg: No edema.  Left lower leg: No edema.  Skin:    General: Skin is warm and dry.     Capillary Refill: Capillary refill takes less than 2 seconds.     Coloration: Skin is not pale.  Neurological:     Mental Status: He is alert.     Comments: Alert and oriented x2.  Clear speech. No facial droop. CNIII-XII grossly intact. Bilateral upper and lower extremities' sensation grossly intact. 5/5 symmetric strength with grip strength and with plantar and dorsi flexion bilaterally.Normal finger to nose bilaterally. Negative pronator drift.  Gait is steady and intact.  Psychiatric:        Mood and Affect: Mood normal.        Behavior: Behavior normal.     ED Results / Procedures / Treatments   Labs (all labs ordered are listed, but only abnormal results are displayed) Labs Reviewed  COMPREHENSIVE METABOLIC PANEL - Abnormal; Notable for the following components:      Result Value   Glucose, Bld 108 (*)    Creatinine, Ser 1.33 (*)    Albumin 3.3 (*)    GFR, Estimated 53 (*)    All other components within normal limits  CBC - Abnormal; Notable for the following components:   RBC 4.18 (*)    Hemoglobin 11.7 (*)    HCT 37.5 (*)    All other components within normal limits  CBG MONITORING, ED - Abnormal; Notable for the following components:   Glucose-Capillary 108 (*)    All other components within normal limits  URINE CULTURE  URINALYSIS, ROUTINE W REFLEX MICROSCOPIC  AMMONIA    EKG None  Radiology CT Head Wo Contrast  Result  Date: 03/25/2021 CLINICAL DATA:  Delirium EXAM: CT HEAD WITHOUT CONTRAST TECHNIQUE: Contiguous axial images were obtained from the base of the skull through the vertex without intravenous contrast. COMPARISON:  07/13/2015 FINDINGS: Brain: No evidence of acute infarction, hemorrhage, hydrocephalus, extra-axial collection or mass lesion/mass effect. Periventricular and deep white matter hypodensity. Vascular: No hyperdense vessel or unexpected calcification. Skull: Normal. Negative for fracture or focal lesion. Sinuses/Orbits: No acute finding. Other: None. IMPRESSION: No acute intracranial pathology. Small-vessel white matter disease. Electronically Signed   By: Eddie Candle M.D.   On: 03/25/2021 12:15    Procedures Procedures   Medications Ordered in ED Medications - No data to display  ED Course  I have reviewed the triage vital signs and the nursing notes.  Pertinent labs & imaging results that were available during my care of the patient were reviewed by me and considered in my medical decision making (see chart for details).    MDM Rules/Calculators/A&P                          BRADLY SANGIOVANNI is a 85 y.o. male with pertinent past medical history of moderate dementia, CKD stage III, hypercholesteremia, hypertension, prostate cancer that presents the emerge department today for altered mental status by daughter.  Daughter states that he is not at baseline because he keeps asking where he is, this is the only abnormality that is new for the patient, otherwise at baseline.  No signs of infection, patient is stable.  Differential to include worsening dementia, urinary tract infection, metabolic encephalopathy.  Patient is hemodynamically stable with a normal neuro exam.   Work-up today unremarkable with normal urinalysis, CBC and CMP unremarkable.  Creatinine and hemoglobin appear not too far from patient's baseline.  CT head without any  acute intracranial abnormality.  Upon repeat exam,  patient is alert and oriented x2 still, still at baseline.  Repeat normal neuro exam, with normal gait.  Shared decision making with daughter about further steps, did discuss options for home health since patient's increased confusion is most likely related to advancing dementia.  Discussed that we did not see any emergent reasons for patient's confusion especially since patient does not have much confusion on my exam, daughter states that she will follow up with PCP and she is agreeable with discharge at this time.  Already has home health coming to house, and are in the process of assisted living facility placement.  Doubt need for further emergent work up at this time. I explained the diagnosis and have given explicit precautions to return to the ER including for any other new or worsening symptoms. The patient understands and accepts the medical plan as it's been dictated and I have answered their questions. Discharge instructions concerning home care and prescriptions have been given. The patient is STABLE and is discharged to home in good condition.   I discussed this case with my attending physician who cosigned this note including patient's presenting symptoms, physical exam, and planned diagnostics and interventions. Attending physician stated agreement with plan or made changes to plan which were implemented.  Attending physician saw patient at bedside.   Final Clinical Impression(s) / ED Diagnoses Final diagnoses:  Dementia without behavioral disturbance, unspecified dementia type [facility])    Rx / DC Orders ED Discharge Orders    None       Alfredia Client, PA-C 03/25/21 1519    Davonna Belling, MD 03/26/21 [phone]

## 2021-03-25 NOTE — Discharge Instructions (Signed)
Your dad's work-up today was unremarkable, please follow-up with your primary care in regards to his dementia. Use the attached instructions.  Please come back to the emergency department today for any new or worsening concerning symptoms.

## 2021-03-25 NOTE — ED Triage Notes (Signed)
Pt BIB daughter, reports patient has had increased confusion since yesterday amongst family members. Denies hx of recent fever or illness. Hx of dementia.

## 2021-03-26 LAB — URINE CULTURE: Culture: NO GROWTH

## 2021-04-01 ENCOUNTER — Encounter: Payer: Medicare Other | Admitting: Family Medicine

## 2021-04-15 ENCOUNTER — Telehealth: Payer: Self-pay | Admitting: Family Medicine

## 2021-04-22 ENCOUNTER — Other Ambulatory Visit: Payer: Self-pay

## 2021-04-22 MED ORDER — ROSUVASTATIN CALCIUM 20 MG PO TABS: 20.0000 mg | ORAL_TABLET | Freq: Every day | ORAL | 3 refills | Status: DC

## 2021-04-22 NOTE — Telephone Encounter (Signed)
Called and spoke with Jerry [name] and informed that pt should only be on Rosuvastatin and I called and made pharmacy aware to no longer fill his Atorvastatin per Dr. Yong Channel note on 02/25/21.

## 2021-04-22 NOTE — Telephone Encounter (Signed)
Patient was informed from the pharmacy that Dr.Hunter may not refill this medication because they switched his medication to rouvastatin, but when they came to last visit Dr.Hunter advised they were going to go back to Atorvastatin. Asking a call back for clarification.

## 2021-06-07 ENCOUNTER — Other Ambulatory Visit: Payer: Self-pay | Admitting: Family Medicine

## 2021-06-07 DIAGNOSIS — R413 Other amnesia: Secondary | ICD-10-CM

## 2021-06-27 ENCOUNTER — Other Ambulatory Visit: Payer: Self-pay | Admitting: Family Medicine

## 2021-07-15 ENCOUNTER — Ambulatory Visit (HOSPITAL_COMMUNITY)
Admission: RE | Admit: 2021-07-15 | Discharge: 2021-07-15 | Disposition: A | Discharge status: Home / Self Care | Payer: Medicare Other | Source: Ambulatory Visit | Attending: Radiation Oncology | Admitting: Radiation Oncology

## 2021-07-15 ENCOUNTER — Other Ambulatory Visit: Payer: Self-pay

## 2021-07-15 DIAGNOSIS — R918 Other nonspecific abnormal finding of lung field: Secondary | ICD-10-CM | POA: Diagnosis not present

## 2021-07-15 DIAGNOSIS — C3490 Malignant neoplasm of unspecified part of unspecified bronchus or lung: Secondary | ICD-10-CM | POA: Diagnosis not present

## 2021-07-15 DIAGNOSIS — J439 Emphysema, unspecified: Secondary | ICD-10-CM | POA: Diagnosis not present

## 2021-07-15 DIAGNOSIS — R911 Solitary pulmonary nodule: Secondary | ICD-10-CM

## 2021-07-15 DIAGNOSIS — I700 Atherosclerosis of aorta: Secondary | ICD-10-CM | POA: Diagnosis not present

## 2021-07-17 NOTE — Progress Notes (Signed)
Radiation Oncology         [phone] ________________________________  Name: Jerry [name] MRN: [id number]  Date: 07/19/2021  DOB: [birth year]  Follow-Up Visit Note  CC: Marin Olp, MD  Marin Olp, MD    ICD-10-CM   1. Lung nodule  R91.1 CT CHEST WO CONTRAST    2. History of lung cancer  Z85.118       Diagnosis: Clinical Stage IA adenocarcinoma of the right upper lobe  Interval Since Last Radiation: 4 years, 2 months, and 10 days ago  05/01/17 - 05/09/17: Right lung / 54 Gy in 3 fractions   07/2000: Prostate radioactive seed placement (Dr. Valere Dross)   Narrative:  The patient returns today for routine follow-up, he was last seen on 07/06/20. Since his last visit, the patient received the following:   - Chest CT on 01/06/21 which demonstrated: stable post treatment changes in the right upper lobe as well as the unchanged appearance of the small bilateral pulmonary nodules.      -  CT of the head on 03/25/21 which demonstrated: no acute intracranial findings. Small-vessel white matter disease was noted.  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 Otherwise, no evidence of metastatic disease was seen in the chest. Of note: bilateral pulmonary nodules were seen to still be stable in appearance.   Of note: the patient presented to the [facility] ED on 03/25/21 with altered mental status. The patients daughter stated that she thought he was presenting signs that were different from his typical dementia. She stated that her father kept repeatedly asking where he was and he was confused about being in two places at once. This lasted for most of the day and continued the next morning once he woke up. Upon arrival to the ED, the patient was aware of his location however stated that he does not "keep up with the year or month". The patient thought that he was at the hospital to get his teeth  brushed. Otherwise, he presented with no acute symptoms of concern and was sent home.                       Allergies:  is allergic to namenda [memantine].  Meds: Current Outpatient Medications  Medication Sig Dispense Refill   amLODipine-olmesartan (AZOR) 5-40 MG tablet TAKE 1 TABLET BY MOUTH DAILY 90 tablet 1   diclofenac Sodium (VOLTAREN) 1 % GEL Apply 2 g topically 4 (four) times daily. 100 g 3   donepezil (ARICEPT) 10 MG tablet TAKE 1 TABLET BY MOUTH DAILY 90 tablet 1   Multiple Vitamin (MULTIVITAMIN) tablet Take 1 tablet by mouth daily. Centrum silver     rosuvastatin (CRESTOR) 20 MG tablet Take 1 tablet (20 mg total) by mouth daily. 90 tablet 3   ferrous sulfate 325 (65 FE) MG tablet Take 325 mg by mouth daily. Patient taking three times a week (Patient not taking: No sig reported)     No current facility-administered medications for this encounter.    Physical Findings: The patient is in no acute distress. Patient is alert and oriented.  height is 5' 7.3" (1.709 m) and weight is 227 lb (103 kg). His temperature is 97.8 F (36.6 C). His blood pressure is 128/80 and his pulse is 77. His respiration is 20 and oxygen saturation is 100%. .  No significant changes. Lungs are clear to auscultation  bilaterally. Heart has regular rate and rhythm. No palpable cervical, supraclavicular, or axillary adenopathy. Abdomen soft, non-tender, normal bowel sounds.    Lab Findings: Lab Results  Component Value Date   WBC 7.7 03/25/2021   HGB 11.7 (L) 03/25/2021   HCT 37.5 (L) 03/25/2021   MCV 89.7 03/25/2021   PLT 215 03/25/2021    Radiographic Findings: CT Chest Wo Contrast  Result Date: 07/17/2021 CLINICAL DATA:  Right upper lobe non-small cell lung cancer. Restaging. EXAM: CT CHEST WITHOUT CONTRAST TECHNIQUE: Multidetector CT imaging of the chest was performed following the standard protocol without IV contrast. COMPARISON:  01/06/2021 chest CT. FINDINGS: Cardiovascular: Normal heart  size. No significant pericardial effusion/thickening. Three-vessel coronary atherosclerosis. Atherosclerotic nonaneurysmal thoracic aorta. Normal caliber pulmonary arteries. Mediastinum/Nodes: No discrete thyroid nodules. Unremarkable esophagus. No pathologically enlarged axillary, mediastinal or hilar lymph nodes, noting limited sensitivity for the detection of hilar adenopathy on this noncontrast study. Lungs/Pleura: No pneumothorax. No pleural effusion. Mild centrilobular emphysema. Sharply marginated bandlike consolidation in the peripheral right upper lobe with associated mild distortion and volume loss, unchanged, compatible with radiation fibrosis. Several chronic subcentimeter solid pulmonary nodules scattered in both lungs measuring up to 0.8 cm in the peripheral right lower lobe (series 5/image 105), all stable since 09/20/2017 chest CT and considered benign. No acute consolidative airspace disease, lung masses or new significant pulmonary nodules. Upper abdomen: Small hiatal hernia. Musculoskeletal: No aggressive appearing focal osseous lesions. Marked thoracic spondylosis. IMPRESSION: 1. 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 2. No evidence of metastatic disease in the chest. 3. Three-vessel coronary atherosclerosis. 4. Small hiatal hernia. 5. Aortic Atherosclerosis (ICD10-I70.0) and Emphysema (ICD10-J43.9). Electronically Signed   By: Ilona Sorrel M.D.   On: 07/17/2021 12:22    Impression:  Clinical Stage IA adenocarcinoma of the right upper lobe  No evidence of recurrence on clinical exam today and recent imaging studies.  Plan: Patient will return for follow-up in 6 months.  Prior to this appointment he will undergo a repeat chest CT scan.   20 minutes of total time was spent for this patient encounter, including preparation, face-to-face counseling with the patient and coordination of care, physical exam, and documentation of the  encounter and ordering of upcoming chest CT scan. ____________________________________  Blair Promise, PhD, MD   This document serves as a record of services personally performed by Gery Pray, MD. It was created on his behalf by Roney Mans, a trained medical scribe. The creation of this record is based on the scribe's personal observations and the provider's statements to them. This document has been checked and approved by the attending provider.

## 2021-07-19 ENCOUNTER — Encounter: Payer: Self-pay | Admitting: Radiation Oncology

## 2021-07-19 ENCOUNTER — Other Ambulatory Visit: Payer: Self-pay

## 2021-07-19 ENCOUNTER — Ambulatory Visit
Admission: RE | Admit: 2021-07-19 | Discharge: 2021-07-19 | Disposition: A | Discharge status: Home / Self Care | Payer: Medicare Other | Source: Ambulatory Visit | Attending: Radiation Oncology | Admitting: Radiation Oncology

## 2021-07-19 VITALS — BP 128/80 | HR 77 | Temp 97.8°F | Resp 20 | Ht 67.3 in | Wt 227.0 lb

## 2021-07-19 DIAGNOSIS — Z79899 Other long term (current) drug therapy: Secondary | ICD-10-CM | POA: Insufficient documentation

## 2021-07-19 DIAGNOSIS — I700 Atherosclerosis of aorta: Secondary | ICD-10-CM | POA: Insufficient documentation

## 2021-07-19 DIAGNOSIS — Z08 Encounter for follow-up examination after completed treatment for malignant neoplasm: Secondary | ICD-10-CM | POA: Diagnosis not present

## 2021-07-19 DIAGNOSIS — Z85118 Personal history of other malignant neoplasm of bronchus and lung: Secondary | ICD-10-CM | POA: Insufficient documentation

## 2021-07-19 DIAGNOSIS — F0390 Unspecified dementia without behavioral disturbance: Secondary | ICD-10-CM | POA: Diagnosis not present

## 2021-07-19 DIAGNOSIS — I2510 Atherosclerotic heart disease of native coronary artery without angina pectoris: Secondary | ICD-10-CM | POA: Insufficient documentation

## 2021-07-19 DIAGNOSIS — J432 Centrilobular emphysema: Secondary | ICD-10-CM | POA: Insufficient documentation

## 2021-07-19 DIAGNOSIS — Z923 Personal history of irradiation: Secondary | ICD-10-CM | POA: Insufficient documentation

## 2021-07-19 DIAGNOSIS — K449 Diaphragmatic hernia without obstruction or gangrene: Secondary | ICD-10-CM | POA: Diagnosis not present

## 2021-07-19 DIAGNOSIS — R911 Solitary pulmonary nodule: Secondary | ICD-10-CM

## 2021-07-19 NOTE — Progress Notes (Signed)
Jerry [name] is here today for follow up post radiation to the lung.  Lung Side: Right, completed 05/09/17  Does the patient complain of any of the following: Pain:Patient denies pain.  Shortness of breath w/wo exertion: on exertion Cough: no Hemoptysis: no Pain with swallowing: no Swallowing/choking concerns: no Appetite: good Energy Level: good energy Post radiation skin Changes: intact   Additional comments if applicable:  Vitals:   14/97/02 1127  BP: 128/80  Pulse: 77  Resp: 20  Temp: 97.8 F (36.6 C)  SpO2: 100%  Weight: 227 lb (103 kg)  Height: 5' 7.3" (1.709 m)

## 2021-08-05 ENCOUNTER — Telehealth: Payer: Self-pay

## 2021-08-05 NOTE — Telephone Encounter (Signed)
Please schedule f/u with available provider.

## 2021-08-05 NOTE — Telephone Encounter (Signed)
Nurse Assessment Nurse: Rolin Barry, RN, Levada Dy Date/Time Eilene Ghazi Time): 08/04/2021 4:34:29 PM Confirm and document reason for call. If symptomatic, describe symptoms. ---Caller states her dad fell earlier today. His knees look like there is blood underneath. The office wanted him triaged. The skin is not broken. Redness on both knees. Advised that father is in a daycare program, another sister picked him up and advised that he tripped and fell. Unwitnessed fall. Does the patient have any new or worsening symptoms? ---Yes Will a triage be completed? ---Yes Related visit to physician within the last 2 weeks? ---No Does the PT have any chronic conditions? (i.e. diabetes, asthma, this includes High risk factors for pregnancy, etc.) ---Yes List chronic conditions. ---dementia HTN Is this a behavioral health or substance abuse call? ---No Guidelines Guideline Title Affirmed Question Affirmed Notes Nurse Date/Time (Eastern Time) Falls and Falling Small cut (scratch) or abrasion (scrape) is also present Rolin Barry, RN, Levada Dy 08/04/2021 4:38:02 PM PLEASE NOTE: All timestamps contained within this report are represented as [place] Standard Time. CONFIDENTIALTY NOTICE: This fax transmission is intended only for the addressee. It contains information that is legally privileged, confidential or otherwise protected from use or disclosure. If you are not the intended recipient, you are strictly prohibited from reviewing, disclosing, copying using or disseminating any of this information or taking any action in reliance on or regarding this information. If you have received this fax in error, please notify us immediately by telephone so that we can arrange for its return to [place]. Phone: [phone], Toll-Free: [phone], Fax: [phone] Page: 2 of 2 Call Id: [id number] Guidelines Guideline Title Affirmed Question Affirmed Notes Nurse Date/Time Eilene Ghazi Time) Knee Injury Minor injury or pain from  direct blow Deaton, RN, Levada Dy 08/04/2021 4:45:46 PM Disp. Time Eilene Ghazi Time) Disposition Final User 08/04/2021 4:45:24 PM Home Care Deaton, RN, Levada Dy 08/04/2021 4:48:37 PM Home Care Yes Deaton, RN, Cindee Lame Disagree/Comply Comply Caller Understands Yes PreDisposition Did not know what to do Care Advice Given Per Guideline HOME CARE: * You should be able to treat this at home. CALL BACK IF: * Doesn't heal within 10 days * You become worse CARE ADVICE given per Fall and Falling (Adult) guideline. HOME CARE: * You should be able to treat this at home. REASSURANCE AND EDUCATION: * It sounds like a bruised muscle or bone. * We can treat that at home. TREATMENT OF MILD CONTUSIONS (E.G., DIRECT BLOW TO KNEE AREA): * Use R.I.C.E. (rest, ice, compression, and elevation) for the first 24 to 48 hours. * Continue to apply crushed ICE in a plastic bag for 10-20 minutes every hour for the first 4 hours. Then apply ice for 10-20 minutes 4 times a day for the first two days. USE A COLD PACK FOR PAIN, SWELLING, OR BRUISING: * Put a cold pack or an ice bag (wrapped in a moist towel) on the area for 20 minutes. * Repeat in 1 hour, then every 4 hours while awake. * Continue this for the first 48 hours (2 days) after an injury. * This will help decrease pain, swelling, and bruising. * ACETAMINOPHEN - EXTRA STRENGTH TYLENOL: Take 1,000 mg (two 500 mg pills) every 8 hours as needed. Each Extra Strength Tylenol pill has 500 mg of acetaminophen. The most you should take each day is 3,000 mg (6 pills a day). EXPECTED COURSE: * Pain and swelling usually peak on day 2 or 3. Swelling is usually gone by 7 days. * Pain may take 2 weeks to completely resolve. CALL  BACK IF: * Severe pain persists over 2 hours after pain medicine and ice * Swelling or bruise becomes over 2 inches (5 cm). * Pain or swelling lasts over 2 weeks * Pain not improved after 3 days * You become worse CARE ADVICE given per Knee Injury (Adult)  guideline. Comments User: Saverio Danker, RN Date/Time Eilene Ghazi Time): 08/04/2021 4:48:29 PM Caller denies any open wound. User: Saverio Danker, RN Date/Time Eilene Ghazi Time): 08/04/2021 4:50:38 PM Caller advised that father has dementia and that he told her he fell to his knees No open wounds, no obvious deformity, no cuts on his pants where he fell, caller wearing long pants. No limping. Advised to monitor and call back

## 2021-08-05 NOTE — Telephone Encounter (Signed)
Patient's daughter states that she is just going to watch how he is doing, and will call if she notices any limping or pain.

## 2021-09-09 ENCOUNTER — Ambulatory Visit (INDEPENDENT_AMBULATORY_CARE_PROVIDER_SITE_OTHER): Payer: Medicare Other | Admitting: Family Medicine

## 2021-09-09 ENCOUNTER — Other Ambulatory Visit: Payer: Self-pay

## 2021-09-09 ENCOUNTER — Encounter: Payer: Self-pay | Admitting: Family Medicine

## 2021-09-09 VITALS — BP 119/66 | HR 71 | Temp 99.0°F | Ht 67.0 in | Wt 228.8 lb

## 2021-09-09 DIAGNOSIS — I10 Essential (primary) hypertension: Secondary | ICD-10-CM | POA: Diagnosis not present

## 2021-09-09 DIAGNOSIS — R739 Hyperglycemia, unspecified: Secondary | ICD-10-CM | POA: Diagnosis not present

## 2021-09-09 DIAGNOSIS — Z23 Encounter for immunization: Secondary | ICD-10-CM

## 2021-09-09 DIAGNOSIS — E785 Hyperlipidemia, unspecified: Secondary | ICD-10-CM | POA: Diagnosis not present

## 2021-09-09 DIAGNOSIS — Z8546 Personal history of malignant neoplasm of prostate: Secondary | ICD-10-CM | POA: Diagnosis not present

## 2021-09-09 DIAGNOSIS — Z0000 Encounter for general adult medical examination without abnormal findings: Secondary | ICD-10-CM

## 2021-09-09 LAB — CBC WITH DIFFERENTIAL/PLATELET
Basophils Absolute: 0.1 10*3/uL (ref 0.0–0.1)
Basophils Relative: 0.9 % (ref 0.0–3.0)
Eosinophils Absolute: 0.1 10*3/uL (ref 0.0–0.7)
Eosinophils Relative: 2 % (ref 0.0–5.0)
HCT: 33 % — ABNORMAL LOW (ref 39.0–52.0)
Hemoglobin: 10.4 g/dL — ABNORMAL LOW (ref 13.0–17.0)
Lymphocytes Relative: 24.7 % (ref 12.0–46.0)
Lymphs Abs: 1.8 10*3/uL (ref 0.7–4.0)
MCHC: 31.5 g/dL (ref 30.0–36.0)
MCV: 79.3 fl (ref 78.0–100.0)
Monocytes Absolute: 0.7 10*3/uL (ref 0.1–1.0)
Monocytes Relative: 8.8 % (ref 3.0–12.0)
Neutro Abs: 4.7 10*3/uL (ref 1.4–7.7)
Neutrophils Relative %: 63.6 % (ref 43.0–77.0)
Platelets: 201 10*3/uL (ref 150.0–400.0)
RBC: 4.16 Mil/uL — ABNORMAL LOW (ref 4.22–5.81)
RDW: 18 % — ABNORMAL HIGH (ref 11.5–15.5)
WBC: 7.4 10*3/uL (ref 4.0–10.5)

## 2021-09-09 LAB — HEMOGLOBIN A1C: Hgb A1c MFr Bld: 6.1 % (ref 4.6–6.5)

## 2021-09-09 LAB — PSA: PSA: 0.2 ng/mL (ref 0.10–4.00)

## 2021-09-09 NOTE — Patient Instructions (Addendum)
Health Maintenance Due  Topic Date Due   Zoster Vaccines- Shingrix (1 of 2) Please check with your pharmacy to see if they have the shingrix vaccine. If they do- please get this immunization and update [place] by phone call or mychart with dates you receive the vaccine  Never done   COVID-19 Vaccine (3 - Pfizer risk series)   Recommend Omicron Specific booster at you local pharmacy ONLY! Please let us know when you have received this. 06/24/2020   Thanks for doing labs If you have mychart- we will send your results within 3 business days of [place] receiving them.  If you do not have mychart- we will call you about results within 5 business days of [place] receiving them.  *please also note that you will see labs on mychart as soon as they post. I will later go in and write notes on them- will say "notes from Dr. Yong Channel"  You have gained weight - I want to encourage you to join your group workouts.  I recommend that you try to get back in with a dentist and ophthalmologist .  Recommended follow up: Return in about 6 months (around 03/09/2022) for follow up- or sooner if needed.

## 2021-09-09 NOTE — Progress Notes (Signed)
Phone: [phone]   Subjective:  Patient presents today for their annual physical. Chief complaint-noted.   See problem oriented charting- ROS- full  review of systems was completed and negative  except for: Cold intolerance, agitation, behavior issues, confusion, anxiety  The following were reviewed and entered/updated in epic: Past Medical History:  Diagnosis Date   Cancer of prostate ([place]) 2001   Chronic kidney disease (CKD), stage III (moderate) ([place])    Jerry [name] 02/08/2017   Dementia ([place])    DIVERTICULOSIS, COLON 07/25/2007   Qualifier: Diagnosis of  By: [name], LPN, [name]    High cholesterol    History of radiation therapy 05/01/17-05/09/17   right lung 54 Gy in 3 fractions   Hypertension    Lung cancer ([place]) 03/2017   Patient Active Problem List   Diagnosis Date Noted   History of lung cancer 09/18/2017    Priority: High   Moderate dementia without behavioral disturbance ([place]) 07/17/2015    Priority: High   Hyperlipidemia 10/15/2014    Priority: Medium   Gout of big toe 08/04/2008    Priority: Medium   Essential hypertension 07/25/2007    Priority: Medium   PROSTATE CANCER, HX OF 07/25/2007    Priority: Medium   Aortic atherosclerosis ([place]) 01/06/2018    Priority: Low   Pulmonary emphysema ([place])     Priority: Low   Allergic rhinitis 10/15/2014    Priority: Low   Overactive bladder 10/15/2014    Priority: Low   CARDIAC MURMUR 01/10/2011    Priority: Low   ORGANIC IMPOTENCE 08/12/2010    Priority: Low   Exertional dyspnea 03/15/2017   Parotiditis 02/08/2017   Facial swelling 02/08/2017   Lung nodule 02/08/2017   Submandibular space infection    Past Surgical History:  Procedure Laterality Date   DIRECT LARYNGOSCOPY N/A 06/21/2017   Procedure: DIRECT LARYNGOSCOPY;  Surgeon: Melissa Montane, MD;  Location: [facility];  Service: ENT;  Laterality: N/A;   [place]  07/2000   Jerry [name] 05/09/2011   PENILE PROSTHESIS IMPLANT   02/2002   Insertion of Mentor three piece inflatable penile prosthesis./notes 05/09/2011   PENILE PROSTHESIS PLACEMENT  06/2003   Placement of Mentor rod prosthesis/notes 05/09/2011   REFRACTIVE SURGERY Bilateral    REMOVAL OF PENILE PROSTHESIS  06/2003   Removal of three-piece Mentor prosthesis/notes 05/09/2011   SCROTAL SURGERY  06/2003   Repair of scrotal defect/notes 05/09/2011    Family History  Problem Relation Age of Onset   Early death Father        cirrhosis   Alcoholism Father    Cirrhosis Other    Breast cancer Sister     Medications- reviewed and updated Current Outpatient Medications  Medication Sig Dispense Refill   amLODipine-olmesartan (AZOR) 5-40 MG tablet TAKE 1 TABLET BY MOUTH DAILY 90 tablet 1   diclofenac Sodium (VOLTAREN) 1 % GEL Apply 2 g topically 4 (four) times daily. 100 g 3   donepezil (ARICEPT) 10 MG tablet TAKE 1 TABLET BY MOUTH DAILY 90 tablet 1   Multiple Vitamin (MULTIVITAMIN) tablet Take 1 tablet by mouth daily. Centrum silver     rosuvastatin (CRESTOR) 20 MG tablet Take 1 tablet (20 mg total) by mouth daily. 90 tablet 3   ferrous sulfate 325 (65 FE) MG tablet Take 325 mg by mouth daily. Patient taking three times a week (Patient not taking: Reported on 09/09/2021)     No current facility-administered medications for this visit.  Allergies-reviewed and updated Allergies  Allergen Reactions   Namenda [Memantine] Other (See Comments)    Made pt feel dizzy    Social History   Social History Narrative   ** Merged History Encounter **       Separated since 2010. 4 kids. 4 grandkids. 2 greatgrandkids.   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 Dementia unfortunately stopped these travels.    Objective  Objective:  BP 119/66   Pulse 71   Temp 99 F (37.2 C) (Temporal)   Ht 5\' 7"  (1.702 m)   Wt 228 lb 12.8 oz (103.8 kg)   SpO2 97%   BMI 35.84 kg/m  Gen: NAD, resting comfortably HEENT:  Mucous membranes are moist. Oropharynx normal Neck: no thyromegaly CV: RRR stable murmur-known sclerosis of aortic valve lungs: CTAB no crackles, wheeze, rhonchi Abdomen: soft/nontender/nondistended/normal bowel sounds. No rebound or guarding.  Ext: no edema Skin: warm, dry Neuro: pleasantly confused   Assessment and Plan  85 y.o. male presenting for annual physical.  Health Maintenance counseling: 1. Anticipatory guidance: Patient counseled regarding regular dental exams - advisedq6 months, eye exams - advised yearly,  avoiding smoking and second hand smoke , limiting alcohol to 2 beverages per day - doesn't drink.  No illicit drugs 2. Risk factor reduction:  Advised patient of need for regular exercise and diet rich and fruits and vegetables to reduce risk of heart attack and stroke. Exercise- not walking as much due to knee pain- encouraged chair exercise. Diet-poor control.  Patient with continued gradual weight gain.  We discussed improving diet to reverse this. Wt Readings from Last 3 Encounters:  09/09/21 228 lb 12.8 oz (103.8 kg)  07/19/21 227 lb (103 kg)  02/25/21 220 lb 3.2 oz (99.9 kg)  3. Immunizations/screenings/ancillary studies- discussed [place], 3rd COVID-19 booster-recommended follow-up specific booster, and Flu shot-high-dose today- otherwise up-to-date. Immunization History  Administered Date(s) Administered   Fluad Quad(high Dose 65+) 10/12/2020, 09/09/2021   Influenza Whole 11/06/2007, 10/07/2008, 12/09/2010   Influenza, High Dose Seasonal PF 08/22/2016, 11/07/2018   Influenza,inj,Quad PF,6+ Mos 10/15/2014   PFIZER(Purple Top)SARS-COV-2 Vaccination 05/07/2020, 05/27/2020   Pneumococcal Conjugate-13 05/08/2015   Pneumococcal Polysaccharide-23 11/06/2007   Td 12/26/2006, 05/03/2018  4. Prostate cancer screening- followed Dr. Amalia Hailey from [facility] due to history of prostate cancer -PSA trend has been encouraging.  Encouraged follow-up with their office. -  Following up with Dr. Sondra Come with history of radiation Lab Results  Component Value Date   PSA 0.11 10/12/2020   PSA 0.09 (L) 06/18/2019   PSA 0.05 (L) 08/01/2016   5. Colon cancer screening - no longer required for screening on 03/22/21 -  with his dementia we opted to not pursue this unless symptomatic such as blood in stool 6. Skin cancer screening- Lower risk due to melanin content. advised regular sunscreen use. Denies worrisome, changing, or new skin lesions.  7.  Former smoker-quit in 1990s-no regular screening required at his age 55. STD screening - patient opts out as not sexually active   Status of chronic or acute concerns   # ED F/U for Dementia without behavioral distubance S:Patient presented to [facility] ED on [facility]25[facility]21 and was seen for altered mental status by daughter. Denied any vomiting, fevers, diarrhea, rashes, cough. Differential to include worsening dementia, urinary tract infection, metabolic encephalopathy. His daughter stated that it was different than his typical dementia - noticed that  he kept asking where he was and  he was confused about being in 2 places at once and he kept repeating that same statement up until the next day.  From EDP "Jerry [name] is a 85 y.o. male with pertinent past medical history of moderate dementia, CKD stage III, hypercholesteremia, hypertension, prostate cancer that presents the emerge department today for altered mental status by daughter.  Daughter states that he is not at baseline because he keeps asking where he is, this is the only abnormality that is new for the patient, otherwise at baseline.  No signs of infection, patient is stable.   Differential to include worsening dementia, urinary tract infection, metabolic encephalopathy.  Patient is hemodynamically stable with a normal neuro exam.    Work-up today unremarkable with normal urinalysis, CBC and CMP unremarkable.  Creatinine and hemoglobin appear not too far from  patient's baseline.  CT head without any acute intracranial abnormality.  Upon repeat exam, patient is alert and oriented x2 still, still at baseline.  Repeat normal neuro exam, with normal gait.  Shared decision making with daughter about further steps, did discuss options for home health since patient's increased confusion is most likely related to advancing dementia.  Discussed that we did not see any emergent reasons for patient's confusion especially since patient does not have much confusion on my exam, daughter states that she will follow up with PCP and she is agreeable with discharge at this time.  Already has home health coming to house, and are in the process of assisted living facility placement."  medication: Aricept 10 mg daily  - in 02/25/21 visit patient was looking at adult daycare with [facility]- he is doing 5 days a week - sounded like sundowning in last visit A/P: Patient with ongoing issues with dementia-continue Aricept 10mg  and discussed likelihood of continued gradual decline -continue adult daycare with [facility] -daughter sleeping over to try to help him when waking up  #hypertension S: medication: Amlodipine-olmesartan 5-40 mg daily  Home readings #s: not checking BP Readings from Last 3 Encounters:  09/09/21 119/66  07/19/21 128/80  03/25/21 120/89  A/P: Controlled. Continue current medications.   #hyperlipidemia/aortic atherosclerosis S: Medication: rosuvastatin 20 mg daily  Lab Results  Component Value Date   CHOL 135 10/12/2020   HDL 36 (L) 10/12/2020   LDLCALC 81 10/12/2020   LDLDIRECT 65.0 11/23/2017   TRIG 101 10/12/2020   CHOLHDL 3.8 10/12/2020   A/P: Previously switched to rosuvastatin due to lower risk of crossing blood-brain barrier compared to atorvastatin.  Update lipid panel today to ensure improvement    # Anemia S:medication: ferrous sulfate 325 mg  in past- he stopped taking this worried about constipation  -trying to get some raisin  bran-Due to dementia on GI visit 07/09/2019 plan was to not work-up iron deficiency further due to age and dementia and risk with anesthesia Lab Results  Component Value Date   WBC 7.7 03/25/2021   HGB 11.7 (L) 03/25/2021   HCT 37.5 (L) 03/25/2021   MCV 89.7 03/25/2021   PLT 215 03/25/2021  A/P: could try slow FE if hgb lower since Gi wants to avoid intervention   Recommended follow up: Return in about 6 months (around 03/09/2022) for follow up- or sooner if needed. Future Appointments  Date Time Provider [place]  01/20/2022 11:30 AM Gery Pray, MD [facility] None  03/28/2022  3:15 PM [facility] HEALTH COACH [facility]   Lab/Order associations:NOT fasting   ICD-10-CM   1. Preventative health care  Z00.00  2. Essential hypertension  I10 CBC with Differential/Platelet    Comprehensive metabolic panel    Lipid panel    3. Hyperlipidemia, unspecified hyperlipidemia type  E78.5 CBC with Differential/Platelet    Comprehensive metabolic panel    Lipid panel    4. Need for immunization against influenza  Z23 Flu Vaccine QUAD High Dose(Fluad)    5. PROSTATE CANCER, HX OF  Z85.46 PSA    6. Hyperglycemia - last CBG slightly high - will check a1c as not fasting to recheck today R73.9 Hemoglobin A1c     I,Harris Phan,acting as a scribe for Garret Reddish, MD.,have documented all relevant documentation on the behalf of Garret Reddish, MD,as directed by  Garret Reddish, MD while in the presence of Garret Reddish, MD.  I, Garret Reddish, MD, have reviewed all documentation for this visit. The documentation on 09/09/21 for the exam, diagnosis, procedures, and orders are all accurate and complete.  Return precautions advised.  Garret Reddish, MD

## 2021-09-10 ENCOUNTER — Other Ambulatory Visit: Payer: Self-pay

## 2021-09-10 DIAGNOSIS — R7989 Other specified abnormal findings of blood chemistry: Secondary | ICD-10-CM

## 2021-09-10 DIAGNOSIS — D649 Anemia, unspecified: Secondary | ICD-10-CM

## 2021-09-10 LAB — COMPREHENSIVE METABOLIC PANEL
ALT: 21 U/L (ref 0–53)
AST: 17 U/L (ref 0–37)
Albumin: 3.6 g/dL (ref 3.5–5.2)
Alkaline Phosphatase: 54 U/L (ref 39–117)
BUN: 19 mg/dL (ref 6–23)
CO2: 22 mEq/L (ref 19–32)
Calcium: 8.7 mg/dL (ref 8.4–10.5)
Chloride: 115 mEq/L — ABNORMAL HIGH (ref 96–112)
Creatinine, Ser: 1.82 mg/dL — ABNORMAL HIGH (ref 0.40–1.50)
GFR: 34.01 mL/min — ABNORMAL LOW (ref >60.00)
Glucose, Bld: 92 mg/dL (ref 70–99)
Potassium: 4.6 mEq/L (ref 3.5–5.1)
Sodium: 140 mEq/L (ref 135–145)
Total Bilirubin: 0.3 mg/dL (ref 0.2–1.2)
Total Protein: 6.5 g/dL (ref 6.0–8.3)

## 2021-09-10 LAB — LIPID PANEL
Cholesterol: 119 mg/dL (ref 0–200)
HDL: 31.9 mg/dL — ABNORMAL LOW (ref >39.00)
NonHDL: 87.01
Total CHOL/HDL Ratio: 4
Triglycerides: 223 mg/dL — ABNORMAL HIGH (ref 0.0–149.0)
VLDL: 44.6 mg/dL — ABNORMAL HIGH (ref 0.0–40.0)

## 2021-09-10 LAB — LDL CHOLESTEROL, DIRECT: Direct LDL: 58 mg/dL

## 2021-09-17 ENCOUNTER — Telehealth: Payer: Self-pay

## 2021-09-17 NOTE — Telephone Encounter (Signed)
I spoke with the pt's daughter to discuss concerns. She questioned what time to give the pt his medication. She says that when he missed 1 dose of medicine she noticed that he was behaving strangely. She refilled his script and began to give it in the evening, and felt like this was the better option. I recommended for her to monitor his actions once giving medications, to see when it best works for him. And continue administering at the same time. She will update if anything changes.

## 2021-09-17 NOTE — Telephone Encounter (Signed)
Nurse Assessment Nurse: Laurann Montana, RN, [facility] Date/Time Eilene Ghazi Time): 09/16/2021 11:32:30 PM Confirm and document reason for call. If symptomatic, describe symptoms. ---Caller states that patient has dementia. Caller states that patient ran out of medication. Caller states that patient has missed one dose of medication. Caller states that patient's behavior has been off. Caller wants to talk about dose and time of medication. Caller states no sx at this time. Does the patient have any new or worsening symptoms? ---No Disp. Time Eilene Ghazi Time) Disposition Final User 09/16/2021 11:39:29 PM Clinical Call Yes Laurann Montana, RN, Luana Shu

## 2021-09-17 NOTE — Telephone Encounter (Signed)
Team if not already enrolled please enroll him with chronic care management with our pharmacist-they can do an advanced review of timing of medications together but for now if doing well can continue current plan

## 2021-09-17 NOTE — Telephone Encounter (Signed)
Please see message below. Thank You.

## 2021-10-07 ENCOUNTER — Telehealth: Payer: Self-pay

## 2021-10-07 NOTE — Telephone Encounter (Signed)
Reasonable to start with primary care and then it can be determined if he needs to see a specialist-I do not recall talking about this last visit.  Typically a spot on penis would not be related to prostate

## 2021-10-07 NOTE — Telephone Encounter (Signed)
Do you want to see pt for the red spot on his penis or dermatology?

## 2021-10-07 NOTE — Telephone Encounter (Signed)
Patient Name: Jerry [name] Gender: Male DOB: 04/07/1936 Age: 85 Y 2 M 26 D Return Phone Number: [phone] (Primary) Address: City/ State/ Zip: [place]  [address] Client [place] [facility] [place] [facility] [place] [facility] Physician Garret Reddish- MD Contact Type Call Who Is Calling Patient / Member / Family / Caregiver Call Type Triage / Clinical Caller Name [name] Relationship To Patient Daughter Return Phone Number [phone] (Primary) Chief Complaint Penis Symptoms Reason for Call Symptomatic / Request for [place] states her father has a red spot on his genitals. Translation No No Triage Reason Other Nurse Assessment Nurse: Raphael Gibney, RN, Vanita Ingles Date/Time Eilene Ghazi Time): 10/07/2021 9:11:25 AM Confirm and document reason for call. If symptomatic, describe symptoms. ---Caller states father is in a memory care program and has gone to that. has red spot on his penis around his urethra. not sure if it is painful. does not think he has a fever. Does the patient have any new or worsening symptoms? ---Yes Will a triage be completed? ---No [place] reason for no triage. ---Other Please document clinical information provided and list any resource used. ---pt is at a memory care program now and daughter has not looked at the penis regarding the red spot. States Dr. Yong Channel said pt should go to a specialist as he is a history of prostate cancer but can not remember what kind of doctor. red spot is about the size of a green pea. advised caller that outcome depends whether or not it is painful, draining, etc advised caller that note will be sent to the office regarding the red spot and info for the specialist. Verbalized understanding Disp. Time Eilene Ghazi Time) Disposition Final User 10/07/2021 9:29:11 AM Clinical Call Yes Raphael Gibney, RN, Vanita Ingles PLEASE NOTE: All timestamps contained  within this report are represented as [place] Standard Time. CONFIDENTIALTY NOTICE: This fax transmission is intended only for the addressee. It contains information that is legally privileged, confidential or otherwise protected from use or disclosure. If you are not the intended recipient, you are strictly prohibited from reviewing, disclosing, copying using or disseminating any of this information or taking any action in reliance on or regarding this information. If you have received this fax in error, please notify us immediately by telephone so that we can arrange for its return to [place]. Phone: [phone], Toll-Free: [phone], Fax: [phone] Page: 2 of 2 Call Id: [id number] Comments User: Dannielle Burn, RN Date/Time Eilene Ghazi Time): 10/07/2021 9:27:45 AM please call daughter back regarding what kind of doctor Dr. Yong Channel wanted pt to see and about the red spot on his Penis.

## 2021-10-07 NOTE — Telephone Encounter (Signed)
Pt's daughter called stating that Jerry [name] has a red spot in his private area. [name] stated that she is unsure what do to and if it would be regarding his prostate. [name] also stated that she really isnt able to come in today or tomorrow because Sipriano goes to a day center and she has to work. I spoke to triage and a nurse will be calling her back. Please Advise.

## 2021-10-08 NOTE — Telephone Encounter (Signed)
Please schedule pt for ov with Dr. Yong Channel per message below.

## 2021-10-11 NOTE — Telephone Encounter (Signed)
Called and LVM to call the office for an appt.

## 2021-10-12 ENCOUNTER — Other Ambulatory Visit: Payer: Self-pay | Admitting: Family Medicine

## 2021-10-12 DIAGNOSIS — R413 Other amnesia: Secondary | ICD-10-CM

## 2021-10-20 ENCOUNTER — Telehealth: Payer: Self-pay | Admitting: Family Medicine

## 2021-10-20 NOTE — Chronic Care Management (AMB) (Signed)
  Care Management  Note   10/20/2021 Name: Jerry [name] MRN: [id number] DOB: 06/12/1936  [name] is a 85 y.o. year old male who is a primary care patient of Yong Channel, Brayton Mars, MD. The care management team was consulted for assistance with chronic disease management and care coordination needs.   [name] was given information about Care Management services today including:  CCM service includes personalized support from designated clinical staff supervised by the physician, including individualized plan of care and coordination with other care providers 24/7 contact phone numbers for assistance for urgent and routine care needs. Service will only be billed when office clinical staff spend 20 minutes or more in a month to coordinate care. Only one practitioner may furnish and bill the service in a calendar month. The patient may stop CCM services at amy time (effective at the end of the month) by phone call to the office staff. The patient will be responsible for cost sharing (co-pay) or up to 20% of the service fee (after annual deductible is met)  Patient agreed to services and verbal consent obtained.  Follow up plan:   Face to Face appointment with care management team member scheduled for: 11/15/21 _0   Noelle Penner [employer] Scheduler

## 2021-10-21 ENCOUNTER — Ambulatory Visit: Payer: Medicare Other | Admitting: Family Medicine

## 2021-10-25 ENCOUNTER — Encounter: Payer: Self-pay | Admitting: Family Medicine

## 2021-10-25 NOTE — Progress Notes (Signed)
Phone [phone] In person visit   Subjective:   Jerry [name] is a 85 y.o. year old very pleasant male patient who presents for/with See problem oriented charting Chief Complaint  Patient presents with   Red spot in private area    This visit occurred during the SARS-CoV-2 public health emergency.  Safety protocols were in place, including screening questions prior to the visit, additional usage of staff PPE, and extensive cleaning of exam room while observing appropriate contact time as indicated for disinfecting solutions.   Past Medical History-  Patient Active Problem List   Diagnosis Date Noted   History of lung cancer 09/18/2017    Priority: High   Moderate dementia without behavioral disturbance ([place]) 07/17/2015    Priority: High   Hyperlipidemia 10/15/2014    Priority: Medium    Gout of big toe 08/04/2008    Priority: Medium    Essential hypertension 07/25/2007    Priority: Medium    PROSTATE CANCER, HX OF 07/25/2007    Priority: Medium    Aortic atherosclerosis ([place]) 01/06/2018    Priority: Low   Pulmonary emphysema (HCC)     Priority: Low   Allergic rhinitis 10/15/2014    Priority: Low   Overactive bladder 10/15/2014    Priority: Low   CARDIAC MURMUR 01/10/2011    Priority: Low   ORGANIC IMPOTENCE 08/12/2010    Priority: Low   Exertional dyspnea 03/15/2017   Parotiditis 02/08/2017   Facial swelling 02/08/2017   Lung nodule 02/08/2017   Submandibular space infection     Medications- reviewed and updated Current Outpatient Medications  Medication Sig Dispense Refill   amLODipine-olmesartan (AZOR) 5-40 MG tablet TAKE 1 TABLET BY MOUTH DAILY 90 tablet 1   diclofenac Sodium (VOLTAREN) 1 % GEL Apply 2 g topically 4 (four) times daily. 100 g 3   donepezil (ARICEPT) 10 MG tablet TAKE 1 TABLET BY MOUTH DAILY 90 tablet 1   Multiple Vitamin (MULTIVITAMIN) tablet Take 1 tablet by mouth daily. Centrum silver     rosuvastatin (CRESTOR) 20 MG tablet Take 1  tablet (20 mg total) by mouth daily. 90 tablet 3   ferrous sulfate 325 (65 FE) MG tablet Take 325 mg by mouth daily. Patient taking three times a week (Patient not taking: No sig reported)     No current facility-administered medications for this visit.     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 Patient claims this is from bumping into something.   A/P: red spot in underwear- history prostate cancer- advised follow up with urology and patients daughter will schedule. Will check UA and culture.   #Dementia- overall stable- as above can be negative at times. . Still on aricept  10 mg daily.    #hypertension S: medication: Amlodipine-olmesartan 5-40 mg daily  BP Readings from Last 3 Encounters:  10/26/21 124/60  09/09/21 119/66  07/19/21 128/80  A/P: Controlled. Continue current medications.   #hyperlipidemia/aortic atherosclerosis S: Medication: rosuvastatin 20 mg daily  -  Previously switched to rosuvastatin due to lower risk of crossing blood-brain barrier compared to atorvastatin.  Lab Results  Component Value Date   CHOL 119 09/09/2021   HDL 31.90 (L) 09/09/2021   LDLCALC 81 10/12/2020   LDLDIRECT 58.0 09/09/2021   TRIG 223.0 (H) 09/09/2021   CHOLHDL 4 09/09/2021   A/P: lipids mildly poorly controlled in  light of aortic atherosclerosis and LDL goal under 70- with that being said with memory concerns do not feel strongly about increasing dose of medicine  # Anemia S:medication: ferrous sulfate 325 mg  in past- he stopped taking this worried about constipation - has not started iron yet -Tried to get some raisin bran-Due to dementia on GI visit 07/09/2019 plan was to not work-up iron deficiency further due to age and dementia and risk with anesthesia -Patient did have worsening renal function at last visit and thought to be related to chronic kidney disease-plan was for repeat CBC and CMP but patient never came back for this-we can complete this today  A/P:renal function worsening- anemia of chronic disease/chronic kidney disease could be the cause of anemia- we will still recheck and I do want him to get on iron- since not on now- hold off on checking ferritin  If gfr dips further- consider renal consult  Recommended follow up: keep march visit or sooner if needed Future Appointments  Date Time Provider [place]  11/15/2021  9:00 AM [facility] CCM PHARMACIST [facility]  01/20/2022 11:30 AM Gery Pray, MD [facility] None  03/15/2022 11:00 AM Marin Olp, MD [facility]  03/28/2022  3:15 PM [facility] HEALTH COACH [facility]    Lab/Order associations:   ICD-10-CM   1. Essential hypertension  I10     2. Hyperlipidemia, unspecified hyperlipidemia type  E78.5     3. Aortic atherosclerosis (HCC)  I70.0     4. Anemia, unspecified type  D64.9     5. Moderate dementia without behavioral disturbance  F03.B0       No orders of the defined types were placed in this encounter.  I,Jada Bradford,acting as a scribe for Garret Reddish, MD.,have documented all relevant documentation on the behalf of Garret Reddish, MD,as directed by  Garret Reddish, MD while in the presence of Garret Reddish, MD.   I, Garret Reddish, MD, have reviewed all documentation for this visit. The  documentation on 10/26/21 for the exam, diagnosis, procedures, and orders are all accurate and complete.   Return precautions advised.  Garret Reddish, MD

## 2021-10-26 ENCOUNTER — Other Ambulatory Visit: Payer: Self-pay

## 2021-10-26 ENCOUNTER — Encounter: Payer: Self-pay | Admitting: Family Medicine

## 2021-10-26 ENCOUNTER — Ambulatory Visit (INDEPENDENT_AMBULATORY_CARE_PROVIDER_SITE_OTHER): Payer: Medicare Other | Admitting: Family Medicine

## 2021-10-26 VITALS — BP 124/60 | HR 63 | Temp 99.1°F | Ht 67.0 in | Wt 227.0 lb

## 2021-10-26 DIAGNOSIS — E785 Hyperlipidemia, unspecified: Secondary | ICD-10-CM | POA: Diagnosis not present

## 2021-10-26 DIAGNOSIS — I10 Essential (primary) hypertension: Secondary | ICD-10-CM

## 2021-10-26 DIAGNOSIS — I700 Atherosclerosis of aorta: Secondary | ICD-10-CM

## 2021-10-26 DIAGNOSIS — F03B0 Unspecified dementia, moderate, without behavioral disturbance, psychotic disturbance, mood disturbance, and anxiety: Secondary | ICD-10-CM | POA: Diagnosis not present

## 2021-10-26 DIAGNOSIS — R58 Hemorrhage, not elsewhere classified: Secondary | ICD-10-CM

## 2021-10-26 DIAGNOSIS — D649 Anemia, unspecified: Secondary | ICD-10-CM | POA: Diagnosis not present

## 2021-10-26 LAB — CBC WITH DIFFERENTIAL/PLATELET
Basophils Absolute: 0 10*3/uL (ref 0.0–0.1)
Basophils Relative: 0.6 % (ref 0.0–3.0)
Eosinophils Absolute: 0.1 10*3/uL (ref 0.0–0.7)
Eosinophils Relative: 1.5 % (ref 0.0–5.0)
HCT: 32.9 % — ABNORMAL LOW (ref 39.0–52.0)
Hemoglobin: 10.4 g/dL — ABNORMAL LOW (ref 13.0–17.0)
Lymphocytes Relative: 32.7 % (ref 12.0–46.0)
Lymphs Abs: 2.6 10*3/uL (ref 0.7–4.0)
MCHC: 31.6 g/dL (ref 30.0–36.0)
MCV: 78.4 fl (ref 78.0–100.0)
Monocytes Absolute: 0.5 10*3/uL (ref 0.1–1.0)
Monocytes Relative: 6.1 % (ref 3.0–12.0)
Neutro Abs: 4.6 10*3/uL (ref 1.4–7.7)
Neutrophils Relative %: 59.1 % (ref 43.0–77.0)
Platelets: 201 10*3/uL (ref 150.0–400.0)
RBC: 4.19 Mil/uL — ABNORMAL LOW (ref 4.22–5.81)
RDW: 18.5 % — ABNORMAL HIGH (ref 11.5–15.5)
WBC: 7.9 10*3/uL (ref 4.0–10.5)

## 2021-10-26 LAB — COMPREHENSIVE METABOLIC PANEL
ALT: 23 U/L (ref 0–53)
AST: 20 U/L (ref 0–37)
Albumin: 3.7 g/dL (ref 3.5–5.2)
Alkaline Phosphatase: 54 U/L (ref 39–117)
BUN: 19 mg/dL (ref 6–23)
CO2: 26 mEq/L (ref 19–32)
Calcium: 9.2 mg/dL (ref 8.4–10.5)
Chloride: 113 mEq/L — ABNORMAL HIGH (ref 96–112)
Creatinine, Ser: 1.48 mg/dL (ref 0.40–1.50)
GFR: 43.55 mL/min — ABNORMAL LOW (ref >60.00)
Glucose, Bld: 108 mg/dL — ABNORMAL HIGH (ref 70–99)
Potassium: 4.3 mEq/L (ref 3.5–5.1)
Sodium: 143 mEq/L (ref 135–145)
Total Bilirubin: 0.3 mg/dL (ref 0.2–1.2)
Total Protein: 6.9 g/dL (ref 6.0–8.3)

## 2021-10-26 LAB — POC URINALSYSI DIPSTICK (AUTOMATED)
Bilirubin, UA: NEGATIVE
Blood, UA: NEGATIVE
Glucose, UA: NEGATIVE
Ketones, UA: NEGATIVE
Leukocytes, UA: NEGATIVE
Nitrite, UA: NEGATIVE
Protein, UA: POSITIVE — AB
Spec Grav, UA: 1.02 (ref 1.010–1.025)
Urobilinogen, UA: 0.2 E.U./dL
pH, UA: 6 (ref 5.0–8.0)

## 2021-10-26 NOTE — Patient Instructions (Addendum)
Health Maintenance Due  Topic Date Due   Zoster Vaccines- Shingrix (1 of 2)- Please check with your pharmacy to see if they have the shingrix vaccine. If they do- please get this immunization and update [place] by phone call or mychart with dates you receive the vaccine  Never done   COVID-19 Vaccine (3 - Pfizer risk series)- Please consider new bivalent booster shot at your local pharmacy and when received send [place] the date so we can put it in our system.  06/24/2020   Please stop by lab before you go If you have mychart- we will send your results within 3 business days of [place] receiving them.  If you do not have mychart- we will call you about results within 5 business days of [place] receiving them.  *please also note that you will see labs on mychart as soon as they post. I will later go in and write notes on them- will say "notes from Dr. Yong Channel"  Recommended follow up: keep march visit or sooner if needed

## 2021-10-26 NOTE — Addendum Note (Signed)
Addended by: Loura Back on: 10/26/2021 02:04 PM   Modules accepted: Orders

## 2021-10-27 LAB — URINE CULTURE
MICRO NUMBER:: 12578042
Result:: NO GROWTH
SPECIMEN QUALITY:: ADEQUATE

## 2021-10-29 ENCOUNTER — Telehealth: Payer: Self-pay

## 2021-10-29 DIAGNOSIS — D649 Anemia, unspecified: Secondary | ICD-10-CM

## 2021-10-29 NOTE — Telephone Encounter (Signed)
Orders placed for stool card

## 2021-11-03 ENCOUNTER — Telehealth: Payer: Self-pay | Admitting: Family Medicine

## 2021-11-03 NOTE — Chronic Care Management (AMB) (Signed)
  Care Management  Note   11/03/2021 Name: [name] MRN: [id number] DOB: 03/24/1936  Jerry [name] is a 85 y.o. year old male who is a primary care patient of Yong Channel, Brayton Mars, MD. The care management team was consulted for assistance with chronic disease management and care coordination needs.   Jerry [name] was given information about Care Management services today including:  CCM service includes personalized support from designated clinical staff supervised by the physician, including individualized plan of care and coordination with other care providers 24/7 contact phone numbers for assistance for urgent and routine care needs. Service will only be billed when office clinical staff spend 20 minutes or more in a month to coordinate care. Only one practitioner may furnish and bill the service in a calendar month. The patient may stop CCM services at amy time (effective at the end of the month) by phone call to the office staff. The patient will be responsible for cost sharing (co-pay) or up to 20% of the service fee (after annual deductible is met)  Patient agreed to services and verbal consent obtained.  Follow up plan:   Face to Face appointment with care management team member scheduled for: 11/15/21 $RemoveBefor'@9am'[id number]$   [place]

## 2021-11-09 ENCOUNTER — Telehealth: Payer: Self-pay | Admitting: Pharmacist

## 2021-11-09 NOTE — Progress Notes (Signed)
Chronic Care Management Pharmacy Note  11/15/2021 Name:  Jerry [name] MRN:  [id number] DOB:  01-24-1936  Summary: Initial visit with PharmD.  Not taking iron, low Hgb last labs - asked for prescription to be sent in to [facility].  Transportation issues with medication.  All meds reviewed and he seems to be doing well overall.  Memory continues to be a concern for daughter.  She is living with him most of the time.  Daughter mentions issues with food affordability  Recommendations/Changes made from today's visit: New rx for Ferrous sulfate to [facility] Monitor GFR for renal dosing of Crestor Consider referral with Education officer, museum for community resources  Plan: Set up with [facility] synchronization and packs FU 6 months   Subjective: Jerry [name] is an 85 y.o. year old male who is a primary patient of Hunter, Brayton Mars, MD.  The CCM team was consulted for assistance with disease management and care coordination needs.    Engaged with patient face to face for initial visit in response to provider referral for pharmacy case management and/or care coordination services.   Consent to Services:  The patient was given the following information about Chronic Care Management services today, agreed to services, and gave verbal consent: 1. CCM service includes personalized support from designated clinical staff supervised by the primary care provider, including individualized plan of care and coordination with other care providers 2. 24/7 contact phone numbers for assistance for urgent and routine care needs. 3. Service will only be billed when office clinical staff spend 20 minutes or more in a month to coordinate care. 4. Only one practitioner may furnish and bill the service in a calendar month. 5.The patient may stop CCM services at any time (effective at the end of the month) by phone call to the office staff. 6. The patient will be responsible for cost sharing (co-pay) of up to 20% of the  service fee (after annual deductible is met). Patient agreed to services and consent obtained.  Patient Care Team: Marin Olp, MD as PCP - General (Family Medicine) Edythe Clarity, [facility] as Pharmacist (Pharmacist)  Recent office visits:  10/26/2021 [facility] (PCP) Marin Olp, MD; start back taking iron since not on now.   09/09/2021 [facility] (PCP) Marin Olp, MD; no medication changes indicated   Recent consult visits:  07/19/2021 [facility] (Rad Onc) Gery Pray, MD; no medication changes indicated.   Hospital visits:  None in previous 6 months   Objective:  Lab Results  Component Value Date   CREATININE 1.48 10/26/2021   BUN 19 10/26/2021   GFR 43.55 (L) 10/26/2021   GFRNONAA 53 (L) 03/25/2021   GFRAA >60 06/21/2017   NA 143 10/26/2021   K 4.3 10/26/2021   CALCIUM 9.2 10/26/2021   CO2 26 10/26/2021   GLUCOSE 108 (H) 10/26/2021    Lab Results  Component Value Date/Time   HGBA1C 6.1 09/09/2021 02:15 PM   GFR 43.55 (L) 10/26/2021 01:46 PM   GFR 34.01 (L) 09/09/2021 02:15 PM    Last diabetic Eye exam: No results found for: HMDIABEYEEXA  Last diabetic Foot exam: No results found for: HMDIABFOOTEX   Lab Results  Component Value Date   CHOL 119 09/09/2021   HDL 31.90 (L) 09/09/2021   LDLCALC 81 10/12/2020   LDLDIRECT 58.0 09/09/2021   TRIG 223.0 (H) 09/09/2021   CHOLHDL 4 09/09/2021    Hepatic Function Latest Ref Rng & Units 10/26/2021 09/09/2021 03/25/2021  Total Protein 6.0 -  8.3 g/dL 6.9 6.5 6.7  Albumin 3.5 - 5.2 g/dL 3.7 3.6 3.3(L)  AST 0 - 37 U/L '20 17 27  ' ALT 0 - 53 U/L 23 21 35  Alk Phosphatase 39 - 117 U/L 54 54 66  Total Bilirubin 0.2 - 1.2 mg/dL 0.3 0.3 0.7  Bilirubin, Direct 0.0 - 0.3 mg/dL - - -    Lab Results  Component Value Date/Time   TSH 1.00 06/18/2019 02:53 PM   TSH 0.62 08/01/2016 11:32 AM    CBC Latest Ref Rng & Units 10/26/2021 09/09/2021 03/25/2021  WBC 4.0 - 10.5 K/uL 7.9 7.4 7.7  Hemoglobin 13.0 - 17.0 g/dL 10.4(L) 10.4(L)  11.7(L)  Hematocrit 39.0 - 52.0 % 32.9(L) 33.0(L) 37.5(L)  Platelets 150.0 - 400.0 K/uL 201.0 201.0 215    No results found for: VD25OH  Clinical ASCVD: No  The ASCVD Risk score (Arnett DK, et al., 2019) failed to calculate for the following reasons:   The 2019 ASCVD risk score is only valid for ages 12 to 22    Depression screen PHQ 2/9 03/22/2021 02/25/2021 10/12/2020  Decreased Interest 1 0 0  Down, Depressed, Hopeless 0 0 0  PHQ - 2 Score 1 0 0  Altered sleeping - - -  Tired, decreased energy - - -  Change in appetite - - -  Feeling bad or failure about yourself  - - -  Trouble concentrating - - -  Moving slowly or fidgety/restless - - -  Suicidal thoughts - - -  PHQ-9 Score - - -  Difficult doing work/chores - - -      Social History   Tobacco Use  Smoking Status Former   Packs/day: 0.50   Types: Cigarettes   Quit date: 12/26/1988   Years since quitting: 32.9  Smokeless Tobacco Never  Tobacco Comments   "I don't remember how long really" (02/08/2017)   BP Readings from Last 3 Encounters:  10/26/21 124/60  09/09/21 119/66  07/19/21 128/80   Pulse Readings from Last 3 Encounters:  10/26/21 63  09/09/21 71  07/19/21 77   Wt Readings from Last 3 Encounters:  10/26/21 227 lb (103 kg)  09/09/21 228 lb 12.8 oz (103.8 kg)  07/19/21 227 lb (103 kg)   BMI Readings from Last 3 Encounters:  10/26/21 35.55 kg/m  09/09/21 35.84 kg/m  07/19/21 35.24 kg/m    Assessment/Interventions: Review of patient past medical history, allergies, medications, health status, including review of consultants reports, laboratory and other test data, was performed as part of comprehensive evaluation and provision of chronic care management services.   SDOH:  (Social Determinants of Health) assessments and interventions performed: Yes  Financial Resource Strain: Low Risk    Difficulty of Paying Living Expenses: Not hard at all    SDOH Screenings   Alcohol Screen: Not on file   Depression (PHQ2-9): Low Risk    PHQ-2 Score: 1  Financial Resource Strain: Low Risk    Difficulty of Paying Living Expenses: Not hard at all  Food Insecurity: No Food Insecurity   Worried About Charity fundraiser in the Last Year: Never true   Arboriculturist in the Last Year: Never true  Housing: Low Risk    Last Housing Risk Score: 0  Physical Activity: Inactive   Days of Exercise per Week: 0 days   Minutes of Exercise per Session: 0 min  Social Connections: Socially Isolated   Frequency of Communication with Friends and Family: Never   Frequency  of Social Gatherings with Friends and Family: Once a week   Attends Religious Services: 1 to 4 times per year   Active Member of [employer] or Organizations: No   Attends Archivist Meetings: Never   Marital Status: Widowed  Stress: No Stress Concern Present   Feeling of Stress : Only a little  Tobacco Use: Medium Risk   Smoking Tobacco Use: Former   Smokeless Tobacco Use: Never   Passive Exposure: Not on file  Transportation Needs: No Transportation Needs   Lack of Transportation (Medical): No   Lack of Transportation (Non-Medical): No    CCM Care Plan  Allergies  Allergen Reactions   Namenda [Memantine] Other (See Comments)    Made pt feel dizzy    Medications Reviewed Today     Reviewed by Edythe Clarity, [facility] (Pharmacist) on 11/15/21 at 1012  Med List Status: <None>   Medication Order Taking? Sig Documenting Provider Last Dose Status Informant  amLODipine-olmesartan (AZOR) 5-40 MG tablet [id number] Yes TAKE 1 TABLET BY MOUTH DAILY Marin Olp, MD Taking Active   diclofenac Sodium (VOLTAREN) 1 % GEL [id number] Yes Apply 2 g topically 4 (four) times daily. Marin Olp, MD Taking Active   donepezil (ARICEPT) 10 MG tablet [id number] Yes TAKE 1 TABLET BY MOUTH DAILY Marin Olp, MD Taking Active   ferrous sulfate 325 (65 FE) MG tablet [id number] Yes Take 325 mg by mouth daily. Patient taking three  times a week [provider] Taking Active   Multiple Vitamin (MULTIVITAMIN) tablet [id number] Yes Take 1 tablet by mouth daily. Centrum silver [provider] Taking Active   rosuvastatin (CRESTOR) 20 MG tablet [id number] Yes Take 1 tablet (20 mg total) by mouth daily. Marin Olp, MD Taking Active             Patient Active Problem List   Diagnosis Date Noted   Aortic atherosclerosis ([place]) 01/06/2018   History of lung cancer 09/18/2017   Exertional dyspnea 03/15/2017   Pulmonary emphysema ([place])    Parotiditis 02/08/2017   Facial swelling 02/08/2017   Lung nodule 02/08/2017   Submandibular space infection    Moderate dementia without behavioral disturbance ([place]) 07/17/2015   Hyperlipidemia 10/15/2014   Allergic rhinitis 10/15/2014   Overactive bladder 10/15/2014   CARDIAC MURMUR 01/10/2011   ORGANIC IMPOTENCE 08/12/2010   Gout of big toe 08/04/2008   Essential hypertension 07/25/2007   PROSTATE CANCER, HX OF 07/25/2007    Immunization History  Administered Date(s) Administered   Fluad Quad(high Dose 65+) 10/12/2020, 09/09/2021   Influenza Whole 11/06/2007, 10/07/2008, 12/09/2010   Influenza, High Dose Seasonal PF 08/22/2016, 11/07/2018   Influenza,inj,Quad PF,6+ Mos 10/15/2014   PFIZER(Purple Top)SARS-COV-2 Vaccination 05/07/2020, 05/27/2020   Pneumococcal Conjugate-13 05/08/2015   Pneumococcal Polysaccharide-23 11/06/2007   Td 12/26/2006, 05/03/2018    Conditions to be addressed/monitored:  HTN, Allergic Rhinitis, Dementia, HLD  Care Plan : General Pharmacy (Adult)  Updates made by Edythe Clarity, RPH since 11/15/2021 12:00 AM     Problem: HTN, Allergic Rhinitis, Dementia, HLD   Priority: High  Onset Date: 11/15/2021     Long-Range Goal: Patient-Specific Goal   Start Date: 11/15/2021  Expected End Date: 05/15/2022  This Visit's Progress: On track  Priority: High  Note:   Current Barriers:  Transportation issues with med pick  up  Pharmacist Clinical Goal(s):  Patient will maintain control of BP/cholesterol as evidenced by labs/monitoring  through collaboration with PharmD and provider.   Interventions: 1:1 collaboration  with Marin Olp, MD regarding development and update of comprehensive plan of care as evidenced by provider attestation and co-signature Inter-disciplinary care team collaboration (see longitudinal plan of care) Comprehensive medication review performed; medication list updated in electronic medical record  Hypertension (BP goal <140/90) -Controlled -Current treatment: Amlodipine-olmesartan 5-66m daily -Medications previously tried: lisinopril -Current home readings: not checking -Current dietary habits: eats breakfast and lunch at adult daycare, dinner is home cooked meal with meat/veggies/starch -Current exercise habits: minimal, chair exercises at adult daycare -Denies hypotensive/hypertensive symptoms -Educated on BP goals and benefits of medications for prevention of heart attack, stroke and kidney damage; Symptoms of hypotension and importance of maintaining adequate hydration; -Counseled to monitor BP at home if able once weekly, document, and provide log at future appointments -Recommended to continue current medication  Hyperlipidemia: (LDL goal < 100) -Controlled -Current treatment: Rosuvastatin 240mdaily -Medications previously tried: atorvastatin  -Current dietary patterns: see HTN -Current exercise habits: see HTN -Educated on Cholesterol goals;  Benefits of statin for ASCVD risk reduction; Importance of limiting foods high in cholesterol; -Recommended to continue current medication Continue to monitor GFR as < 30 would need to switch statins   Dementia (Goal: Prevent progression) -Controlled -Current treatment  Donepezil 1031maily -Medications previously tried: memantine (dizziness) -Takes med with no adverse effects, denies any GI symptoms  -Recommended  to continue current medication Will set patient up with [facility] pharmacy for medication packaging and synchronization to help with transportation and memory concern.  Patient Goals/Self-Care Activities Patient will:  - take medications as prescribed as evidenced by patient report and record review focus on medication adherence by pill packs check blood pressure periodically, document, and provide at future appointments  Follow Up Plan: The care management team will reach out to the patient again over the next 180 days.        Medication Assistance: None required.  Patient affirms current coverage meets needs.  Compliance/Adherence/Medication fill history: Care Gaps: Shingles Vaccine  Star-Rating Drugs: Rosuvastatin 10/03/21 90ds  Patient's [facility] is:  [address] [place] [id number]: [phone]: [id number]: [phone]: [phone] pill box? Yes Pt endorses 100% compliance  We discussed: Benefits of medication synchronization, packaging and delivery as well as enhanced pharmacist oversight with [facility]. Patient decided to: Utilize [facility] pharmacy for medication synchronization, packaging and delivery Verbal consent obtained for [facility] enhanced pharmacy services (medication synchronization, adherence packaging, delivery coordination). A medication sync plan was created to allow patient to get all medications delivered once every 30 to 90 days per patient preference. Patient understands they have freedom to choose pharmacy and clinical pharmacist will coordinate care between all prescribers and [facility].  Care Plan and Follow Up Patient Decision:  Patient agrees to Care Plan  and Follow-up.  Plan: The care management team will reach out to the patient again over the next 180 days.  ChrBeverly MilchharmD Clinical Pharmacist  [phone]

## 2021-11-09 NOTE — Chronic Care Management (AMB) (Signed)
Chronic Care Management Pharmacy Assistant   Name: Jerry [name]  MRN: [id number] DOB: 1936/11/01   Reason for Encounter: Chart Review For Initial Visit With Clinical Pharmacist   Conditions to be addressed/monitored: HTN, AA, Pulmonary Emphysema, HLD  Primary concerns for visit include: HTN, HLD  Recent office visits:  10/26/2021 OV (PCP) Marin Olp, MD; start back taking iron since not on now.  09/09/2021 OV (PCP) Marin Olp, MD; no medication changes indicated  Recent consult visits:  07/19/2021 [facility] (Rad Onc) Gery Pray, MD; no medication changes indicated.  Hospital visits:  None in previous 6 months  Medications: Outpatient Encounter Medications as of 11/09/2021  Medication Sig   amLODipine-olmesartan (AZOR) 5-40 MG tablet TAKE 1 TABLET BY MOUTH DAILY   diclofenac Sodium (VOLTAREN) 1 % GEL Apply 2 g topically 4 (four) times daily.   donepezil (ARICEPT) 10 MG tablet TAKE 1 TABLET BY MOUTH DAILY   ferrous sulfate 325 (65 FE) MG tablet Take 325 mg by mouth daily. Patient taking three times a week (Patient not taking: No sig reported)   Multiple Vitamin (MULTIVITAMIN) tablet Take 1 tablet by mouth daily. Centrum silver   rosuvastatin (CRESTOR) 20 MG tablet Take 1 tablet (20 mg total) by mouth daily.   No facility-administered encounter medications on file as of 11/09/2021.   Current Medications: Donepezil 10 mg last filled 09/14/2021 90 DS Amlodipine-Olmesartan 5-40 mg last filled 10/31/2021 90 DS Rosuvastatin 20 mg last filled 10/03/2021 90 DS Multiple Vitamin Diclofenac Sodium 1% gel last filled 10/12/2020 Ferrous Sulfate 325 mg - not currently taking  Patient Questions: Any changes in your medications or health? Patients daughter states the patient has dementia. She saw a little red dot on his underwear once a few months ago, she hasn't seen any since.  Any side effects from any medications?  Patients daughter states the patient doesn't  currently have any side effects from any of his medications that they are aware of.  Do you have any symptoms or problems not managed by your medications? Patients daughter states he doesn't have any problems or symptoms that are not currently managed by his medications.  Any concerns about your health right now? She states her main concerns for the patient is his dementia.  Has your provider asked that you check blood pressure, blood sugar, or follow special diet at home? The patient does not current check blood pressure or blood sugar at home. His daughter tries to follow mediterranean diet.  Do you get any type of exercise on a regular basis? The patient does some chair exercises at his adult daycare. His daughter states he also does other activities.  Can you think of a goal you would like to reach for your health? She states she would like for the patient to lose some weight as he has recently gained weight. Also to be more active.  Do you have any problems getting your medications? Yes, they have transportation issues.  Is there anything that you would like to discuss during the appointment?  Patients daughter would like to discuss using [facility].  Please bring medications and supplements to appointment   Care Gaps: Medicare Annual Wellness: Completed 03/22/2021 Hemoglobin A1C: 6.1% on 09/09/2021 Colonoscopy: Aged out   Future Appointments  Date Time Provider [place]  11/15/2021  9:00 AM [facility] CCM PHARMACIST [facility]  01/20/2022 11:30 AM Gery Pray, MD [facility] None  03/15/2022 11:00 AM Marin Olp, MD [facility]  03/28/2022  3:15  PM [facility] HEALTH COACH [facility]     Star Rating Drugs: Amlodipine-Olmesartan 5-40 mg last filled 10/31/2021 90 DS Rosuvastatin 20 mg last filled 10/03/2021 90 DS  April D Calhoun, [place] Pharmacist Assistant [phone]

## 2021-11-15 ENCOUNTER — Ambulatory Visit (INDEPENDENT_AMBULATORY_CARE_PROVIDER_SITE_OTHER): Payer: Medicare Other | Admitting: Pharmacist

## 2021-11-15 ENCOUNTER — Other Ambulatory Visit: Payer: Self-pay

## 2021-11-15 DIAGNOSIS — E785 Hyperlipidemia, unspecified: Secondary | ICD-10-CM

## 2021-11-15 DIAGNOSIS — F03B0 Unspecified dementia, moderate, without behavioral disturbance, psychotic disturbance, mood disturbance, and anxiety: Secondary | ICD-10-CM

## 2021-11-15 DIAGNOSIS — I10 Essential (primary) hypertension: Secondary | ICD-10-CM

## 2021-11-15 MED ORDER — FERROUS SULFATE 325 (65 FE) MG PO TABS: ORAL_TABLET | ORAL | 3 refills | Status: DC

## 2021-11-15 NOTE — Patient Instructions (Signed)
Visit Information   Goals Addressed   None    There are no care plans to display for this patient.   Jerry [name] was given information about Chronic Care Management services today including:  CCM service includes personalized support from designated clinical staff supervised by his physician, including individualized plan of care and coordination with other care providers 24/7 contact phone numbers for assistance for urgent and routine care needs. Standard insurance, coinsurance, copays and deductibles apply for chronic care management only during months in which we provide at least 20 minutes of these services. Most insurances cover these services at 100%, however patients may be responsible for any copay, coinsurance and/or deductible if applicable. This service may help you avoid the need for more expensive face-to-face services. Only one practitioner may furnish and bill the service in a calendar month. The patient may stop CCM services at any time (effective at the end of the month) by phone call to the office staff.  Patient agreed to services and verbal consent obtained.   The patient verbalized understanding of instructions, educational materials, and care plan provided today and agreed to receive a mailed copy of patient instructions, educational materials, and care plan.  Telephone follow up appointment with pharmacy team member scheduled for: 6 months  Edythe Clarity, [place]

## 2021-11-15 NOTE — Addendum Note (Signed)
Addended by: Marin Olp on: 11/15/2021 07:27 PM   Modules accepted: Orders

## 2021-11-16 ENCOUNTER — Other Ambulatory Visit: Payer: Self-pay

## 2021-11-16 DIAGNOSIS — R413 Other amnesia: Secondary | ICD-10-CM

## 2021-11-16 MED ORDER — AMLODIPINE-OLMESARTAN 5-40 MG PO TABS: 1.0000 | ORAL_TABLET | Freq: Every day | ORAL | 1 refills | Status: DC

## 2021-11-16 MED ORDER — FERROUS SULFATE 325 (65 FE) MG PO TABS: ORAL_TABLET | ORAL | 3 refills | Status: DC

## 2021-11-16 MED ORDER — DONEPEZIL HCL 10 MG PO TABS: 10.0000 mg | ORAL_TABLET | Freq: Every day | ORAL | 1 refills | Status: DC

## 2021-11-16 MED ORDER — ROSUVASTATIN CALCIUM 20 MG PO TABS: 20.0000 mg | ORAL_TABLET | Freq: Every day | ORAL | 3 refills | Status: DC

## 2021-11-23 DIAGNOSIS — Z1159 Encounter for screening for other viral diseases: Secondary | ICD-10-CM | POA: Diagnosis not present

## 2021-11-23 DIAGNOSIS — Z20822 Contact with and (suspected) exposure to covid-19: Secondary | ICD-10-CM | POA: Diagnosis not present

## 2021-11-24 DIAGNOSIS — E785 Hyperlipidemia, unspecified: Secondary | ICD-10-CM

## 2021-11-24 DIAGNOSIS — F03B0 Unspecified dementia, moderate, without behavioral disturbance, psychotic disturbance, mood disturbance, and anxiety: Secondary | ICD-10-CM | POA: Diagnosis not present

## 2021-11-24 DIAGNOSIS — I10 Essential (primary) hypertension: Secondary | ICD-10-CM

## 2021-12-04 DIAGNOSIS — Z20822 Contact with and (suspected) exposure to covid-19: Secondary | ICD-10-CM | POA: Diagnosis not present

## 2021-12-08 DIAGNOSIS — Z20822 Contact with and (suspected) exposure to covid-19: Secondary | ICD-10-CM | POA: Diagnosis not present

## 2022-01-13 ENCOUNTER — Telehealth: Payer: Self-pay | Admitting: *Deleted

## 2022-01-13 NOTE — Telephone Encounter (Signed)
CALLED PATIENT TO ASK ABOUT ALTERING FU ON 01-20-22 DUE TO DR. KINARD BEING IN THE OR, RESCHEDULED FOR 01-24-22 @ 3:30 PM, LVM FOR A RETURN CALL

## 2022-01-14 ENCOUNTER — Telehealth: Payer: Self-pay | Admitting: *Deleted

## 2022-01-14 NOTE — Telephone Encounter (Signed)
RETURNED PATIENT'S DAUGHTER'S PHONE [place], SPOKE [place]

## 2022-01-19 ENCOUNTER — Encounter: Payer: Self-pay | Admitting: Radiology

## 2022-01-19 ENCOUNTER — Other Ambulatory Visit: Payer: Self-pay

## 2022-01-19 ENCOUNTER — Ambulatory Visit (HOSPITAL_COMMUNITY)
Admission: RE | Admit: 2022-01-19 | Discharge: 2022-01-19 | Disposition: A | Discharge status: Home / Self Care | Payer: Medicare Other | Source: Ambulatory Visit | Attending: Radiation Oncology | Admitting: Radiation Oncology

## 2022-01-19 DIAGNOSIS — I700 Atherosclerosis of aorta: Secondary | ICD-10-CM | POA: Diagnosis not present

## 2022-01-19 DIAGNOSIS — R911 Solitary pulmonary nodule: Secondary | ICD-10-CM | POA: Insufficient documentation

## 2022-01-19 DIAGNOSIS — J439 Emphysema, unspecified: Secondary | ICD-10-CM | POA: Diagnosis not present

## 2022-01-19 DIAGNOSIS — C3490 Malignant neoplasm of unspecified part of unspecified bronchus or lung: Secondary | ICD-10-CM | POA: Diagnosis not present

## 2022-01-20 ENCOUNTER — Ambulatory Visit: Payer: Self-pay | Admitting: Radiation Oncology

## 2022-01-23 NOTE — Progress Notes (Signed)
Radiation Oncology         [phone] ________________________________  Name: Jerry [name] MRN: [id number]  Date: 01/24/2022  DOB: 1936-03-21  Follow-Up Visit Note  CC: Marin Olp, MD  Marin Olp, MD    ICD-10-CM   1. History of lung cancer  Z85.118     2. PROSTATE CANCER, HX OF  Z85.46     3. Lung nodule  R91.1 CT CHEST WO CONTRAST      Diagnosis: Clinical Stage IA adenocarcinoma of the right upper lobe  Interval Since Last Radiation: 4 years, 8 months, and 12 days   05/01/17 - 05/09/17: Right lung / 54 Gy in 3 fractions   07/2000: Prostate radioactive seed placement (Dr. Valere Dross)  Narrative:  The patient returns today for routine follow-up and to review recent imaging, he was last seen here for follow up on 07/19/21.     Most recent chest CT on 01/19/22 demonstrated continued stability of tiny bilateral sub pleural, parenchymal, and perifissural nodules bilaterally, and stable treatment related changes in the peripheral right upper lobe. No new progressive findings were appreciated.      Otherwise, no significant interval history since the patient was last seen.  The daughter reports the patient has had some problems with nosebleeds and will speak to primary care if this continues to be a problem.  Patient is dementia and goes to a memory care program 5 days a week which has been helpful.  He has some dyspnea with exertion but no chest pain or history of coughing up blood.                      Allergies:  is allergic to namenda [memantine].  Meds: Current Outpatient Medications  Medication Sig Dispense Refill   amLODipine-olmesartan (AZOR) 5-40 MG tablet Take 1 tablet by mouth daily. 90 tablet 1   diclofenac Sodium (VOLTAREN) 1 % GEL Apply 2 g topically 4 (four) times daily. 100 g 3   donepezil (ARICEPT) 10 MG tablet Take 1 tablet (10 mg total) by mouth daily. 90 tablet 1   Multiple Vitamin (MULTIVITAMIN) tablet Take 1 tablet by mouth daily. Centrum  silver     rosuvastatin (CRESTOR) 20 MG tablet Take 1 tablet (20 mg total) by mouth daily. 90 tablet 3   ferrous sulfate 325 (65 FE) MG tablet Take three times a week (Patient not taking: Reported on 01/24/2022) 40 tablet 3   No current facility-administered medications for this encounter.    Physical Findings: The patient is in no acute distress. Patient is alert and oriented.  height is 5\' 7"  (1.702 m) and weight is 228 lb (103.4 kg). His temporal temperature is 97 F (36.1 C) (abnormal). His blood pressure is 151/57 (abnormal) and his pulse is 63. His respiration is 18 and oxygen saturation is 100%. .  No significant changes. Lungs are clear to auscultation bilaterally. Heart has regular rate and rhythm. No palpable cervical, supraclavicular, or axillary adenopathy. Abdomen soft, non-tender, normal bowel sounds.    Lab Findings: Lab Results  Component Value Date   WBC 7.9 10/26/2021   HGB 10.4 (L) 10/26/2021   HCT 32.9 (L) 10/26/2021   MCV 78.4 10/26/2021   PLT 201.0 10/26/2021    Radiographic Findings: CT CHEST WO CONTRAST  Result Date: 01/21/2022 CLINICAL DATA:  Non-small-cell lung cancer.  Restaging. EXAM: CT CHEST WITHOUT CONTRAST TECHNIQUE: Multidetector CT imaging of the chest was performed following the standard protocol without IV contrast. RADIATION  DOSE REDUCTION: This exam was performed according to the departmental dose-optimization program which includes automated exposure control, adjustment of the mA and/or kV according to patient size and/or use of iterative reconstruction technique. COMPARISON:  07/15/2021 FINDINGS: Cardiovascular: The heart size is normal. No substantial pericardial effusion. Coronary artery calcification is evident. Mild atherosclerotic calcification is noted in the wall of the thoracic aorta. Mediastinum/Nodes: No mediastinal lymphadenopathy. No evidence for gross hilar lymphadenopathy although assessment is limited by the lack of intravenous contrast  on the current study. The esophagus has normal imaging features. There is no axillary lymphadenopathy. Lungs/Pleura: Centrilobular emphsyema noted. Bandlike scarring in the peripheral right upper lobe is stable, consistent with treatment related changes. Triangle shaped subpleural nodule in the right middle lobe also stable in the interval likely lymph node. Previously identified 8 mm nodule in the right lung base is 7 mm today on image 118/7. Several tiny perifissural nodules in the left lung are unchanged, likely lymph nodes. No new suspicious pulmonary nodule or mass. No focal airspace consolidation. There is no evidence of pleural effusion. Upper Abdomen: Unremarkable. Musculoskeletal: No worrisome lytic or sclerotic osseous abnormality. IMPRESSION: 1. Stable exam. No new or progressive findings. 2. Stable treatment related changes in the peripheral right upper lobe. 3. Continued stability of tiny bilateral sub pleural, parenchymal, and perifissural nodules bilaterally, compatible with benign etiology. 4. Aortic Atherosclerosis (ICD10-I70.0) and Emphysema (ICD10-J43.9). Electronically Signed   By: Misty Stanley M.D.   On: 01/21/2022 09:18    Impression: Clinical Stage IA adenocarcinoma of the right upper lobe  No evidence of recurrence on clinical exam today.  Recent chest CT scan shows stability without recurrence or new problems.  Plan: Routine follow-up in 6 months.  Prior to this scan patient will undergo a chest CT scan.  At that point he will be 5 years out from treatment and will move to once a year scans.   20 minutes of total time was spent for this patient encounter, including preparation, face-to-face counseling with the patient and coordination of care, physical exam, and documentation of the encounter. ____________________________________  Blair Promise, PhD, MD  This document serves as a record of services personally performed by Gery Pray, MD. It was created on his behalf by  Roney Mans, a trained medical scribe. The creation of this record is based on the scribe's personal observations and the provider's statements to them. This document has been checked and approved by the attending provider.

## 2022-01-24 ENCOUNTER — Encounter: Payer: Self-pay | Admitting: Radiation Oncology

## 2022-01-24 ENCOUNTER — Ambulatory Visit
Admission: RE | Admit: 2022-01-24 | Discharge: 2022-01-24 | Disposition: A | Discharge status: Home / Self Care | Payer: Medicare Other | Source: Ambulatory Visit | Attending: Radiation Oncology | Admitting: Radiation Oncology

## 2022-01-24 VITALS — BP 151/57 | HR 63 | Temp 97.0°F | Resp 18 | Ht 67.0 in | Wt 228.0 lb

## 2022-01-24 DIAGNOSIS — F0390 Unspecified dementia without behavioral disturbance: Secondary | ICD-10-CM | POA: Diagnosis not present

## 2022-01-24 DIAGNOSIS — R918 Other nonspecific abnormal finding of lung field: Secondary | ICD-10-CM | POA: Insufficient documentation

## 2022-01-24 DIAGNOSIS — Z8546 Personal history of malignant neoplasm of prostate: Secondary | ICD-10-CM | POA: Insufficient documentation

## 2022-01-24 DIAGNOSIS — Z85118 Personal history of other malignant neoplasm of bronchus and lung: Secondary | ICD-10-CM | POA: Insufficient documentation

## 2022-01-24 DIAGNOSIS — Z923 Personal history of irradiation: Secondary | ICD-10-CM | POA: Insufficient documentation

## 2022-01-24 DIAGNOSIS — J439 Emphysema, unspecified: Secondary | ICD-10-CM | POA: Insufficient documentation

## 2022-01-24 DIAGNOSIS — Z79899 Other long term (current) drug therapy: Secondary | ICD-10-CM | POA: Diagnosis not present

## 2022-01-24 DIAGNOSIS — Z08 Encounter for follow-up examination after completed treatment for malignant neoplasm: Secondary | ICD-10-CM | POA: Diagnosis not present

## 2022-01-24 DIAGNOSIS — R911 Solitary pulmonary nodule: Secondary | ICD-10-CM

## 2022-01-24 NOTE — Progress Notes (Signed)
Jerry [name] is here today for follow up post radiation to the lung.  Lung Side: right  Completed radiation treatment on: 05/09/2017  Does the patient complain of any of the following: Pain:denies Shortness of breath w/wo exertion: shortness of breath with exertion Cough: denies Hemoptysis: denies Pain with swallowing: denies Swallowing/choking concerns: denies Appetite: good Energy Level: good Post radiation skin Changes: n/a    Additional comments if applicable: nosebleeds recently  Vitals:   01/24/22 1527  BP: (!) 151/57  Pulse: 63  Resp: 18  Temp: (!) 97 F (36.1 C)  TempSrc: Temporal  SpO2: 100%  Weight: 228 lb (103.4 kg)  Height: 5\' 7"  (1.702 m)

## 2022-01-31 ENCOUNTER — Other Ambulatory Visit: Payer: Self-pay | Admitting: Family Medicine

## 2022-02-08 ENCOUNTER — Telehealth: Payer: Self-pay

## 2022-02-08 DIAGNOSIS — U071 COVID-19: Secondary | ICD-10-CM | POA: Diagnosis not present

## 2022-02-08 DIAGNOSIS — Z03818 Encounter for observation for suspected exposure to other biological agents ruled out: Secondary | ICD-10-CM | POA: Diagnosis not present

## 2022-02-08 NOTE — Telephone Encounter (Signed)
Type of form received:Health Medical Examination Report   Form should be Faxed to: [facility] [phone]   Call patient once it has been faxed - [name] [phone]  Form placed:  [place] charge sheet.   Individual made aware of 3-5 business day turn around (Y/N)?

## 2022-02-08 NOTE — Telephone Encounter (Signed)
Noted  

## 2022-02-10 NOTE — Telephone Encounter (Signed)
Jerry [name] called wanting updated info on form.- Patient stated if I can be completed and faxed back as soon as possible if not early next week.. Dr Yong Channel not in office daughter aware.

## 2022-02-14 NOTE — Telephone Encounter (Signed)
Called and spoke with Jerry [name] and made her aware that Dr. Yong Channel has been out of the office, however we have been receiving her messages and have received the form which is in Dr. Ronney Lion box to be signed and we will let her know when the form has been completed and faxed. She would like the letter to be placed up front regarding jury duty once available. Ok to write letter?

## 2022-02-14 NOTE — Telephone Encounter (Signed)
[name] has called back in regard for status update.  Is requesting call back at [phone].  Also, is requesting a letter stating patients disabilities and why patient is not able to perform jury duty.

## 2022-02-14 NOTE — Telephone Encounter (Signed)
Does not need letter-Any person summoned as a juror who is a Charity fundraiser at an out of state educational institution and who wishes to be excused or who is 70 years or older and who wishes to be excused, deferred, or exempted, may make the request without appearing in person by filing a signed statement of the ground of the request at any time five business days before the date upon which the person is summoned to appear.  https://www.[place].gov/help-topics/jury-service/jury-service#:~:text=Any%20person%20summoned%20as%20a,ground%20of%20the%20request%20at

## 2022-02-15 NOTE — Telephone Encounter (Signed)
Called and made [name] aware that I have faxed the paperwork to [facility] and also made her  aware of Dr. Yong Channel response below.

## 2022-02-16 ENCOUNTER — Other Ambulatory Visit: Payer: Self-pay | Admitting: Family Medicine

## 2022-02-23 NOTE — Telephone Encounter (Signed)
Daughter has called stating that [facility] had not received form.  Is requesting to fax again.    Please follow up with [facility] and [name]) at [phone].  Daughter states that patient can not attend program if not received by Friday 3/3.

## 2022-02-24 NOTE — Telephone Encounter (Signed)
The form was faxed on 02/21. After faxed it was placed in the "to scan" folder so the form is no longer in the clinic. I reached out to [facility] to have them fax another form and I am awaiting a call back from [place]. Will contact pt daughter once I hear from [place].  ?

## 2022-02-25 NOTE — Telephone Encounter (Signed)
Form has been faxed again and picked up by [facility]. ?

## 2022-03-15 ENCOUNTER — Encounter: Payer: Self-pay | Admitting: Family Medicine

## 2022-03-15 ENCOUNTER — Ambulatory Visit (INDEPENDENT_AMBULATORY_CARE_PROVIDER_SITE_OTHER): Payer: Medicare Other | Admitting: Family Medicine

## 2022-03-15 VITALS — BP 120/54 | HR 58 | Temp 98.1°F | Ht 67.0 in | Wt 230.2 lb

## 2022-03-15 DIAGNOSIS — D649 Anemia, unspecified: Secondary | ICD-10-CM

## 2022-03-15 DIAGNOSIS — F03B0 Unspecified dementia, moderate, without behavioral disturbance, psychotic disturbance, mood disturbance, and anxiety: Secondary | ICD-10-CM

## 2022-03-15 DIAGNOSIS — Z85118 Personal history of other malignant neoplasm of bronchus and lung: Secondary | ICD-10-CM | POA: Diagnosis not present

## 2022-03-15 DIAGNOSIS — J439 Emphysema, unspecified: Secondary | ICD-10-CM

## 2022-03-15 DIAGNOSIS — Z8546 Personal history of malignant neoplasm of prostate: Secondary | ICD-10-CM | POA: Diagnosis not present

## 2022-03-15 DIAGNOSIS — I700 Atherosclerosis of aorta: Secondary | ICD-10-CM | POA: Diagnosis not present

## 2022-03-15 DIAGNOSIS — E785 Hyperlipidemia, unspecified: Secondary | ICD-10-CM | POA: Diagnosis not present

## 2022-03-15 LAB — CBC WITH DIFFERENTIAL/PLATELET
Basophils Absolute: 0.1 10*3/uL (ref 0.0–0.1)
Basophils Relative: 0.9 % (ref 0.0–3.0)
Eosinophils Absolute: 0.1 10*3/uL (ref 0.0–0.7)
Eosinophils Relative: 1.4 % (ref 0.0–5.0)
HCT: 31.7 % — ABNORMAL LOW (ref 39.0–52.0)
Hemoglobin: 9.9 g/dL — ABNORMAL LOW (ref 13.0–17.0)
Lymphocytes Relative: 26.4 % (ref 12.0–46.0)
Lymphs Abs: 2 10*3/uL (ref 0.7–4.0)
MCHC: 31.3 g/dL (ref 30.0–36.0)
MCV: 76.4 fl — ABNORMAL LOW (ref 78.0–100.0)
Monocytes Absolute: 0.5 10*3/uL (ref 0.1–1.0)
Monocytes Relative: 6.5 % (ref 3.0–12.0)
Neutro Abs: 4.8 10*3/uL (ref 1.4–7.7)
Neutrophils Relative %: 64.8 % (ref 43.0–77.0)
Platelets: 217 10*3/uL (ref 150.0–400.0)
RBC: 4.15 Mil/uL — ABNORMAL LOW (ref 4.22–5.81)
RDW: 19.9 % — ABNORMAL HIGH (ref 11.5–15.5)
WBC: 7.5 10*3/uL (ref 4.0–10.5)

## 2022-03-15 LAB — COMPREHENSIVE METABOLIC PANEL
ALT: 16 U/L (ref 0–53)
AST: 15 U/L (ref 0–37)
Albumin: 3.7 g/dL (ref 3.5–5.2)
Alkaline Phosphatase: 49 U/L (ref 39–117)
BUN: 21 mg/dL (ref 6–23)
CO2: 24 mEq/L (ref 19–32)
Calcium: 8.8 mg/dL (ref 8.4–10.5)
Chloride: 115 mEq/L — ABNORMAL HIGH (ref 96–112)
Creatinine, Ser: 1.47 mg/dL (ref 0.40–1.50)
GFR: 43.78 mL/min — ABNORMAL LOW (ref >60.00)
Glucose, Bld: 106 mg/dL — ABNORMAL HIGH (ref 70–99)
Potassium: 4.3 mEq/L (ref 3.5–5.1)
Sodium: 145 mEq/L (ref 135–145)
Total Bilirubin: 0.3 mg/dL (ref 0.2–1.2)
Total Protein: 6.5 g/dL (ref 6.0–8.3)

## 2022-03-15 LAB — IBC + FERRITIN
Ferritin: 7.1 ng/mL — ABNORMAL LOW (ref 22.0–322.0)
Iron: 75 ug/dL (ref 42–165)
Saturation Ratios: 18.8 % — ABNORMAL LOW (ref 20.0–50.0)
TIBC: 399 ug/dL (ref 250.0–450.0)
Transferrin: 285 mg/dL (ref 212.0–360.0)

## 2022-03-15 LAB — PSA: PSA: 0.2 ng/mL (ref 0.10–4.00)

## 2022-03-15 NOTE — Progress Notes (Signed)
?Phone [phone] ?In person visit ?  ?Subjective:  ? ?Jerry [name] is a 86 y.o. year old very pleasant male patient who presents for/with See problem oriented charting ?Chief Complaint  ?Patient presents with  ? Follow-up  ? Hypertension  ? ?This visit occurred during the SARS-CoV-2 public health emergency.  Safety protocols were in place, including screening questions prior to the visit, additional usage of staff PPE, and extensive cleaning of exam room while observing appropriate contact time as indicated for disinfecting solutions.  ? ?Past Medical History-  ?Patient Active Problem List  ? Diagnosis Date Noted  ? History of lung cancer 09/18/2017  ?  Priority: High  ? Moderate dementia without behavioral disturbance ([place]) 07/17/2015  ?  Priority: High  ? Hyperlipidemia 10/15/2014  ?  Priority: Medium   ? Gout of big toe 08/04/2008  ?  Priority: Medium   ? Essential hypertension 07/25/2007  ?  Priority: Medium   ? PROSTATE CANCER, HX OF 07/25/2007  ?  Priority: Medium   ? Aortic atherosclerosis ([place]) 01/06/2018  ?  Priority: Low  ? Pulmonary emphysema ([place])   ?  Priority: Low  ? Parotiditis 02/08/2017  ?  Priority: Low  ? Allergic rhinitis 10/15/2014  ?  Priority: Low  ? Overactive bladder 10/15/2014  ?  Priority: Low  ? CARDIAC MURMUR 01/10/2011  ?  Priority: Low  ? ORGANIC IMPOTENCE 08/12/2010  ?  Priority: Low  ? ? ?Medications- reviewed and updated ?Current Outpatient Medications  ?Medication Sig Dispense Refill  ? amLODipine-olmesartan (AZOR) 5-40 MG tablet TAKE 1 TABLET BY MOUTH DAILY 90 tablet 0  ? diclofenac Sodium (VOLTAREN) 1 % GEL Apply 2 g topically 4 (four) times daily. 100 g 3  ? donepezil (ARICEPT) 10 MG tablet Take 1 tablet (10 mg total) by mouth daily. 90 tablet 1  ? ferrous sulfate 325 (65 FE) MG tablet Take three times a week 40 tablet 3  ? Multiple Vitamin (MULTIVITAMIN) tablet Take 1 tablet by mouth daily. Centrum silver    ? rosuvastatin (CRESTOR) 20 MG tablet Take 1 tablet (20 mg  total) by mouth daily. 90 tablet 3  ? ?No current facility-administered medications for this visit.  ? ?  ?Objective:  ?BP (!) 120/54   Pulse (!) 58   Temp 98.1 ?F (36.7 ?C)   Ht 5\' 7"  (1.702 m)   Wt 230 lb 3.2 oz (104.4 kg)   SpO2 97%   BMI 36.05 kg/m?  ?Gen: NAD, resting comfortably ?CV: RRR (low normal HR)  no murmurs rubs or gallops ?Lungs: CTAB no crackles, wheeze, rhonchi ?Ext: no edema ?Skin: warm, dry ?Neuro: pleasantly confused ?  ? ?Assessment and Plan  ? ?#History of lung cancer-annual imaging with Dr. Iantha Fallen recently 01/19/2022  And was stable ? ?#History of prostate cancer-follows with [facility] urology yearly in the past- had advised follow up but they dont take his insurance- we opted to check another psa with psa trending up but on other hand at his age with dementia would be harder decision on whether to pursue workup ? ?# anemia- was not able to do stool cards and not sure if would even want colonoscopy- we will check iron stores with anemia slightly worsening.  ? ?#Dementia ?S: Medication: Donepezil 10 mg ?-Did not tolerate Namenda due to dizziness ?-Most recent neuroimaging 03/25/2021 head CT without contrast due to delirium-no acute intracranial pathology but did have some small vessel white matter disease and prior to that 07/13/2015 MRI of the  brain-no obvious cause for memory loss ?Neurology: Previously seen by Dr. Jerene Pitch visit in 2017 ? ?- at adult day center and has enjoyed that ?- ongoing mild worsening of memory ?MMSE - Mini Mental State Exam 03/23/2017 09/26/2016 07/02/2015  ?Orientation to time 0 2 2  ?Orientation to Place 5 4 5   ?Registration 3 3 3   ?Attention/ Calculation 1 0 1  ?Recall 0 0 0  ?Language- name 2 objects 2 2 2   ?Language- repeat 1 1 1   ?Language- follow 3 step command 3 2 3   ?Language- read & follow direction 1 1 1   ?Write a sentence 1 1 1   ?Copy design 0 1 1  ?Total score 17 17 20   ?A/P: mild worsening- continue to monitor- continue current meds. Consider  repeating MMSE in future for update as no longer seeing neurology ?  ?#hypertension ?S: medication: Amlodipine-olmesartan 5-40 mg ?A/P: Controlled. Continue current medications.  ? ?#hyperlipidemia ?#Aortic atherosclerosis ?S: Medication: Rosuvastatin 20 mg ?Lab Results  ?Component Value Date  ? CHOL 119 09/09/2021  ? HDL 31.90 (L) 09/09/2021  ? [place] 81 10/12/2020  ? LDLDIRECT 58.0 09/09/2021  ? TRIG 223.0 (H) 09/09/2021  ? CHOLHDL 4 09/09/2021  ? A/P: LDL at goal on last check- continue current meds. Likely aortic atherosclerosis stable- continue current medicine ? ?#Emphysema-incidental finding on imaging ?S: Medication: None. No breathing issues but also not very active- does some chair exercises at day program ?A/P: overall stable without inhalers- continue to monitor  ?  ?Recommended follow up: Return in about 6 months (around 09/15/2022) for physical or sooner if needed.Schedule b4 you leave. ?Future Appointments  ?Date Time Provider [place]  ?03/28/2022  3:15 PM [facility] HEALTH COACH [facility]  ?05/23/2022  2:45 PM [facility] CCM PHARMACIST [facility]  ?07/25/2022  3:15 PM Gery Pray, MD [facility] None  ? ?Lab/Order associations: ?  ICD-10-CM   ?1. Moderate dementia without behavioral disturbance (HCC)  F03.B0   ?  ?2. Pulmonary emphysema, unspecified emphysema type (HCC) Chronic J43.9   ?  ?3. Aortic atherosclerosis (HCC) Chronic I70.0   ?  ?4. History of lung cancer  Z85.118   ?  ?5. Hyperlipidemia, unspecified hyperlipidemia type  E78.5 CBC with Differential/Platelet  ?  Comprehensive metabolic panel  ?  ?6. PROSTATE CANCER, HX OF  Z85.46 PSA  ?  ?7. Anemia, unspecified type  D64.9 IBC + Ferritin  ?  ? ? ?Return precautions advised.  ?Garret Reddish, MD ? ?

## 2022-03-15 NOTE — Patient Instructions (Addendum)
Thanks for doing labs today ?If you have mychart- we will send your results within 3 business days of [place] receiving them.  ?If you do not have mychart- we will call you about results within 5 business days of [place] receiving them.  ?*please also note that you will see labs on mychart as soon as they post. I will later go in and write notes on them- will say "notes from Dr. Yong Channel"  ? ?No changes today unless PSA continues to go up- would be hard decision on treatment with dementia- also check anemia- also would be hard decision as may not do well with anesthesia ? ?Recommended follow up: Return in about 6 months (around 09/15/2022) for physical or sooner if needed.Schedule b4 you leave. ?

## 2022-03-22 ENCOUNTER — Other Ambulatory Visit: Payer: Self-pay

## 2022-03-22 ENCOUNTER — Telehealth: Payer: Self-pay | Admitting: Family Medicine

## 2022-03-22 DIAGNOSIS — D649 Anemia, unspecified: Secondary | ICD-10-CM

## 2022-03-22 NOTE — Telephone Encounter (Signed)
Called and spoke with [name] and labs reviewed.  ?

## 2022-03-22 NOTE — Telephone Encounter (Signed)
Pt's daughter, [name], returning a call to our office regarding pt's labs. ?

## 2022-03-28 ENCOUNTER — Telehealth: Payer: Self-pay | Admitting: Family Medicine

## 2022-03-28 ENCOUNTER — Ambulatory Visit: Payer: Medicare Other

## 2022-03-28 NOTE — Telephone Encounter (Signed)
Pt's daughter states they are seeking respite services. She states she would like to speak with [facility] during pt's lab appt on 04/18/22 ?

## 2022-03-29 NOTE — Telephone Encounter (Signed)
Tried to reach [place] to see what questions she has for Dr. Yong Channel however she did not answer and mailbox was full. ?

## 2022-04-12 ENCOUNTER — Telehealth: Payer: Self-pay

## 2022-04-12 NOTE — Telephone Encounter (Signed)
Daughter is calling in requesting a letter stating patients diagnosis.  States due to patient's progressed dementia.  She is having to replace her sister as caregiver.  States she was in a [employer] and has had to withdraw. States school is requesting information.  Daughter is currently on DPR and will send POA via email.

## 2022-04-13 NOTE — Telephone Encounter (Signed)
To whom it may concern, ? ?Jerry [name] is a primary care patient of mine and has advanced dementia and requires regular support from caregivers. ? ?Thanks, ?Garret Reddish, MD ?

## 2022-04-13 NOTE — Telephone Encounter (Signed)
Called and lm on [name] vm that letter has been written.  ?

## 2022-04-13 NOTE — Telephone Encounter (Signed)
Dr. Yong Channel, please see message. ?

## 2022-04-18 ENCOUNTER — Other Ambulatory Visit: Payer: Medicare Other

## 2022-04-18 ENCOUNTER — Other Ambulatory Visit (INDEPENDENT_AMBULATORY_CARE_PROVIDER_SITE_OTHER): Payer: Medicare Other

## 2022-04-18 DIAGNOSIS — D649 Anemia, unspecified: Secondary | ICD-10-CM | POA: Diagnosis not present

## 2022-04-18 LAB — CBC WITH DIFFERENTIAL/PLATELET
Basophils Absolute: 0.1 10*3/uL (ref 0.0–0.1)
Basophils Relative: 0.8 % (ref 0.0–3.0)
Eosinophils Absolute: 0.1 10*3/uL (ref 0.0–0.7)
Eosinophils Relative: 1.1 % (ref 0.0–5.0)
HCT: 35.2 % — ABNORMAL LOW (ref 39.0–52.0)
Hemoglobin: 11 g/dL — ABNORMAL LOW (ref 13.0–17.0)
Lymphocytes Relative: 31.1 % (ref 12.0–46.0)
Lymphs Abs: 2.3 10*3/uL (ref 0.7–4.0)
MCHC: 31.1 g/dL (ref 30.0–36.0)
MCV: 78.9 fl (ref 78.0–100.0)
Monocytes Absolute: 0.5 10*3/uL (ref 0.1–1.0)
Monocytes Relative: 6.6 % (ref 3.0–12.0)
Neutro Abs: 4.5 10*3/uL (ref 1.4–7.7)
Neutrophils Relative %: 60.4 % (ref 43.0–77.0)
Platelets: 203 10*3/uL (ref 150.0–400.0)
RBC: 4.46 Mil/uL (ref 4.22–5.81)
RDW: 22.4 % — ABNORMAL HIGH (ref 11.5–15.5)
WBC: 7.5 10*3/uL (ref 4.0–10.5)

## 2022-04-22 ENCOUNTER — Telehealth: Payer: Self-pay | Admitting: Family Medicine

## 2022-04-22 NOTE — Telephone Encounter (Signed)
See below, I am unsure what to order and Dr. Jerline Pain has gone for the day. ?

## 2022-04-22 NOTE — Telephone Encounter (Signed)
Ok with me. Please place any necessary orders. 

## 2022-04-22 NOTE — Telephone Encounter (Signed)
Called and spoke with pt daughter and she states she has covid symptoms and lives with her father and wants him to get tested when she goes to get tested today at 2pm. She states she was told she needs an Rx for him faxed to [facility] on [address]. Is this something you can do for pt in Dr. Yong Channel absence? ?

## 2022-04-22 NOTE — Telephone Encounter (Signed)
Daughter of pt is requesting a COVID-19 testing prescription to be faxed to  ?[phone] ?[facility]. ? ?Please call back for any needed information. ?

## 2022-04-22 NOTE — Telephone Encounter (Signed)
Pt's daughter states she was told: RN or PA can send "doctor's order" if Rx is not appropriate. ?

## 2022-04-25 NOTE — Telephone Encounter (Signed)
I am not sure either-could call the testing site ?

## 2022-05-09 NOTE — Progress Notes (Deleted)
Chronic Care Management Pharmacy Note  05/09/2022 Name:  Jerry [name] MRN:  [id number] DOB:  11/02/1936  Summary: Initial visit with PharmD.  Not taking iron, low Hgb last labs - asked for prescription to be sent in to [facility].  Transportation issues with medication.  All meds reviewed and he seems to be doing well overall.  Memory continues to be a concern for daughter.  She is living with him most of the time.  Daughter mentions issues with food affordability  Recommendations/Changes made from today's visit: New rx for Ferrous sulfate to [facility] Monitor GFR for renal dosing of Crestor Consider referral with Education officer, museum for community resources  Plan: Set up with [facility] synchronization and packs FU 6 months   Subjective: Jerry [name] is an 86 y.o. year old male who is a primary patient of Hunter, Brayton Mars, MD.  The CCM team was consulted for assistance with disease management and care coordination needs.    Engaged with patient face to face for initial visit in response to provider referral for pharmacy case management and/or care coordination services.   Consent to Services:  The patient was given the following information about Chronic Care Management services today, agreed to services, and gave verbal consent: 1. CCM service includes personalized support from designated clinical staff supervised by the primary care provider, including individualized plan of care and coordination with other care providers 2. 24/7 contact phone numbers for assistance for urgent and routine care needs. 3. Service will only be billed when office clinical staff spend 20 minutes or more in a month to coordinate care. 4. Only one practitioner may furnish and bill the service in a calendar month. 5.The patient may stop CCM services at any time (effective at the end of the month) by phone call to the office staff. 6. The patient will be responsible for cost sharing (co-pay) of up to 20% of the  service fee (after annual deductible is met). Patient agreed to services and consent obtained.  Patient Care Team: Marin Olp, MD as PCP - General (Family Medicine) Edythe Clarity, [facility] as Pharmacist (Pharmacist)  Recent office visits:  10/26/2021 OV (PCP) Marin Olp, MD; start back taking iron since not on now.   09/09/2021 OV (PCP) Marin Olp, MD; no medication changes indicated   Recent consult visits:  07/19/2021 OV (Rad Onc) Gery Pray, MD; no medication changes indicated.   Hospital visits:  None in previous 6 months   Objective:  Lab Results  Component Value Date   CREATININE 1.47 03/15/2022   BUN 21 03/15/2022   GFR 43.78 (L) 03/15/2022   GFRNONAA 53 (L) 03/25/2021   GFRAA >60 06/21/2017   NA 145 03/15/2022   K 4.3 03/15/2022   CALCIUM 8.8 03/15/2022   CO2 24 03/15/2022   GLUCOSE 106 (H) 03/15/2022    Lab Results  Component Value Date/Time   HGBA1C 6.1 09/09/2021 02:15 PM   GFR 43.78 (L) 03/15/2022 11:43 AM   GFR 43.55 (L) 10/26/2021 01:46 PM    Last diabetic Eye exam: No results found for: HMDIABEYEEXA  Last diabetic Foot exam: No results found for: HMDIABFOOTEX   Lab Results  Component Value Date   CHOL 119 09/09/2021   HDL 31.90 (L) 09/09/2021   LDLCALC 81 10/12/2020   LDLDIRECT 58.0 09/09/2021   TRIG 223.0 (H) 09/09/2021   CHOLHDL 4 09/09/2021       Latest Ref Rng & Units 03/15/2022   11:43 AM 10/26/2021  1:46 PM 09/09/2021    2:15 PM  Hepatic Function  Total Protein 6.0 - 8.3 g/dL 6.5   6.9   6.5    Albumin 3.5 - 5.2 g/dL 3.7   3.7   3.6    AST 0 - 37 U/L _0 ALT 0 - 53 U/L _1 Alk Phosphatase 39 - 117 U/L 49   54   54    Total Bilirubin 0.2 - 1.2 mg/dL 0.3   0.3   0.3      Lab Results  Component Value Date/Time   TSH 1.00 06/18/2019 02:53 PM   TSH 0.62 08/01/2016 11:32 AM       Latest Ref Rng & Units 04/18/2022   10:28 AM 03/15/2022   11:43 AM 10/26/2021    1:46 PM  CBC  WBC 4.0  - 10.5 K/uL 7.5   7.5   7.9    Hemoglobin 13.0 - 17.0 g/dL 11.0   9.9   10.4    Hematocrit 39.0 - 52.0 % 35.2   31.7   32.9    Platelets 150.0 - 400.0 K/uL 203.0   217.0   201.0      No results found for: VD25OH  Clinical ASCVD: No  The ASCVD Risk score (Arnett DK, et al., 2019) failed to calculate for the following reasons:   The 2019 ASCVD risk score is only valid for ages 18 to 53       03/22/2021    3:26 PM 02/25/2021   10:11 AM 10/12/2020    3:49 PM  Depression screen PHQ 2/9  Decreased Interest 1 0 0  Down, Depressed, Hopeless 0 0 0  PHQ - 2 Score 1 0 0      Social History   Tobacco Use  Smoking Status Former   Packs/day: 0.50   Types: Cigarettes   Quit date: 12/26/1988   Years since quitting: 33.3  Smokeless Tobacco Never  Tobacco Comments   "I don't remember how long really" (02/08/2017)   BP Readings from Last 3 Encounters:  03/15/22 (!) 120/54  01/24/22 (!) 151/57  10/26/21 124/60   Pulse Readings from Last 3 Encounters:  03/15/22 (!) 58  01/24/22 63  10/26/21 63   Wt Readings from Last 3 Encounters:  03/15/22 230 lb 3.2 oz (104.4 kg)  01/24/22 228 lb (103.4 kg)  10/26/21 227 lb (103 kg)   BMI Readings from Last 3 Encounters:  03/15/22 36.05 kg/m  01/24/22 35.71 kg/m  10/26/21 35.55 kg/m    Assessment/Interventions: Review of patient past medical history, allergies, medications, health status, including review of consultants reports, laboratory and other test data, was performed as part of comprehensive evaluation and provision of chronic care management services.   SDOH:  (Social Determinants of Health) assessments and interventions performed: Yes  Financial Resource Strain: Not on file    SDOH Screenings   Alcohol Screen: Not on file  Depression (PHQ2-9): Not on file  Financial Resource Strain: Not on file  Food Insecurity: Not on file  Housing: Not on file  Physical Activity: Not on file  Social Connections: Not on file  Stress: Not  on file  Tobacco Use: Medium Risk   Smoking Tobacco Use: Former   Smokeless Tobacco Use: Never   Passive Exposure: Not on file  Transportation Needs: Not on file    [place]  Allergies  Allergen Reactions  Namenda [Memantine] Other (See Comments)    Made pt feel dizzy    Medications Reviewed Today     Reviewed by Angela Adam, CMA (Certified Medical Assistant) on 03/15/22 at 1124  Med List Status: <None>   Medication Order Taking? Sig Documenting Provider Last Dose Status Informant  amLODipine-olmesartan (AZOR) 5-40 MG tablet [id number]  TAKE 1 TABLET BY MOUTH DAILY Marin Olp, MD  Active   diclofenac Sodium (VOLTAREN) 1 % GEL [id number]  Apply 2 g topically 4 (four) times daily. Marin Olp, MD  Active   donepezil (ARICEPT) 10 MG tablet [id number]  Take 1 tablet (10 mg total) by mouth daily. Marin Olp, MD  Active   ferrous sulfate 325 (65 FE) MG tablet [id number]  Take three times a week  Patient not taking: Reported on 01/24/2022   Marin Olp, MD  Active   Multiple Vitamin (MULTIVITAMIN) tablet [id number]  Take 1 tablet by mouth daily. Centrum silver [provider]  Active   rosuvastatin (CRESTOR) 20 MG tablet [id number]  Take 1 tablet (20 mg total) by mouth daily. Marin Olp, MD  Active             Patient Active Problem List   Diagnosis Date Noted   Aortic atherosclerosis ([place]) 01/06/2018   History of lung cancer 09/18/2017   Pulmonary emphysema ([place])    Parotiditis 02/08/2017   Moderate dementia without behavioral disturbance ([place]) 07/17/2015   Hyperlipidemia 10/15/2014   Allergic rhinitis 10/15/2014   Overactive bladder 10/15/2014   CARDIAC MURMUR 01/10/2011   ORGANIC IMPOTENCE 08/12/2010   Gout of big toe 08/04/2008   Essential hypertension 07/25/2007   PROSTATE CANCER, HX OF 07/25/2007    Immunization History  Administered Date(s) Administered   Fluad Quad(high Dose 65+) 10/12/2020, 09/09/2021   Influenza  Whole 11/06/2007, 10/07/2008, 12/09/2010   Influenza, High Dose Seasonal PF 08/22/2016, 11/07/2018   Influenza,inj,Quad PF,6+ Mos 10/15/2014   PFIZER(Purple Top)SARS-COV-2 Vaccination 05/07/2020, 05/27/2020   Pneumococcal Conjugate-13 05/08/2015   Pneumococcal Polysaccharide-23 11/06/2007   Td 12/26/2006, 05/03/2018    Conditions to be addressed/monitored:  HTN, Allergic Rhinitis, Dementia, HLD  There are no care plans that you recently modified to display for this patient.     Medication Assistance: None required.  Patient affirms current coverage meets needs.  Compliance/Adherence/Medication fill history: Care Gaps: Shingles Vaccine  Star-Rating Drugs: Rosuvastatin 10/03/21 90ds  Patient's preferred pharmacy is:  [id number] - [address], [address] [place] [address] Phone: [phone] Fax: [phone]  [facility] [address] [place] [place] [address] Phone: [phone] Fax: [phone]   Uses pill box? Yes Pt endorses 100% compliance  We discussed: Benefits of medication synchronization, packaging and delivery as well as enhanced pharmacist oversight with [facility]. Patient decided to: Utilize [facility] pharmacy for medication synchronization, packaging and delivery Verbal consent obtained for [facility] enhanced pharmacy services (medication synchronization, adherence packaging, delivery coordination). A medication sync plan was created to allow patient to get all medications delivered once every 30 to 90 days per patient preference. Patient understands they have freedom to choose pharmacy and clinical pharmacist will coordinate care between all prescribers and [facility].  Care Plan and Follow Up Patient Decision:  Patient agrees to Care Plan and Follow-up.  Plan: The care management team will reach out  to the patient again  over the next 180 days.  Beverly Milch, PharmD Clinical Pharmacist  [employer] [phone]    Current Barriers:  Transportation issues with med pick up  Pharmacist Clinical Goal(s):  Patient will maintain control of BP/cholesterol as evidenced by labs/monitoring  through collaboration with PharmD and provider.   Interventions: 1:1 collaboration with Marin Olp, MD regarding development and update of comprehensive plan of care as evidenced by provider attestation and co-signature Inter-disciplinary care team collaboration (see longitudinal plan of care) Comprehensive medication review performed; medication list updated in electronic medical record  Hypertension (BP goal <140/90) -Controlled -Current treatment: Amlodipine-olmesartan 5-44m daily -Medications previously tried: lisinopril -Current home readings: not checking -Current dietary habits: eats breakfast and lunch at adult daycare, dinner is home cooked meal with meat/veggies/starch -Current exercise habits: minimal, chair exercises at adult daycare -Denies hypotensive/hypertensive symptoms -Educated on BP goals and benefits of medications for prevention of heart attack, stroke and kidney damage; Symptoms of hypotension and importance of maintaining adequate hydration; -Counseled to monitor BP at home if able once weekly, document, and provide log at future appointments -Recommended to continue current medication  Hyperlipidemia: (LDL goal < 100) -Controlled -Current treatment: Rosuvastatin 280mdaily -Medications previously tried: atorvastatin  -Current dietary patterns: see HTN -Current exercise habits: see HTN -Educated on Cholesterol goals;  Benefits of statin for ASCVD risk reduction; Importance of limiting foods high in cholesterol; -Recommended to continue current medication Continue to monitor GFR as < 30 would need to switch statins   Dementia (Goal: Prevent  progression) -Controlled -Current treatment  Donepezil 1024maily -Medications previously tried: memantine (dizziness) -Takes med with no adverse effects, denies any GI symptoms  -Recommended to continue current medication Will set patient up with [facility] for medication packaging and synchronization to help with transportation and memory concern.  Patient Goals/Self-Care Activities Patient will:  - take medications as prescribed as evidenced by patient report and record review focus on medication adherence by pill packs check blood pressure periodically, document, and provide at future appointments  Follow Up Plan: The care management team will reach out to the patient again over the next 180 days.

## 2022-05-16 ENCOUNTER — Telehealth: Payer: Self-pay | Admitting: Family Medicine

## 2022-05-16 NOTE — Telephone Encounter (Signed)
Patient Name: Jerry [name] Gender: Male DOB: 05/10/1936 Age: 86 Y 78 M 4 D Return Phone Number: [phone] (Primary) Address: City/ State/ Zip: [place]  [address] Client [place] [facility] [place] Client Site [place] [facility] [place] Night Provider Garret Reddish- MD Contact Type Call Who Is Calling Patient / Member / Family / Caregiver Call Type Triage / Clinical Caller Name Yates Weisgerber Relationship To Patient Daughter Return Phone Number [phone] (Primary) Chief Complaint Strange, Abnormal, or Paranoid Behavior Reason for Call Symptomatic / Request for [place] stated that her Dad has dementia and she is having some caregiver stress. Caller stated that her Dad has walked off to return home a few times but his home is unlivable at the time. Caller stated that the police have to be called when he walks off and she is concerned that he should be going to a facility for his own safety. Caller wants some advice on the matter and what to do if there is an emergency with him. Caller also stated that her Dad picked up a chair at her the other night. Translation No Nurse Assessment Nurse: Ronnald Ramp, RN, Rollene Fare Date/Time (Eastern Time): 05/14/2022 8:28:52 PM Confirm and document reason for call. If symptomatic, describe symptoms. ---Caller stated that her Dad has dementia and she is having some caregiver stress. Caller stated that her Dad has walked off to return home a few times but his home is unlivable at the time. Caller stated that the police have to be called when he walks off and she is concerned that he should be going to a facility for his own safety. Caller wants some advice on the matter and what to do if there is an emergency with him. Caller also stated that her Dad picked up a chair at her the other night. Does the patient have any new or worsening symptoms? ---Yes Will a  triage be completed? ---Yes Related visit to physician within the last 2 weeks? ---No Does the PT have any chronic conditions? (i.e. diabetes, asthma, this includes High risk factors for pregnancy, etc.) ---Yes Nurse Assessment List chronic conditions. ---Dementia HTN Is this a behavioral health or substance abuse call? ---No Guidelines Guideline Title Affirmed Question Affirmed Notes Nurse Date/Time (Eastern Time) Dementia Symptoms and Questions [1] Caller has NONURGENT question (includes prescribed medication questions) AND [2] triager unable to answer Ronnald Ramp, RN, Rollene Fare 05/14/2022 8:29:18 PM Disp. Time Eilene Ghazi Time) Disposition Final User 05/14/2022 8:45:04 PM Call PCP when Office is Open Yes Ronnald Ramp, RN, Doree Albee Disagree/Comply Comply Caller Understands Yes PreDisposition Call Doctor Care Advice Given Per Guideline CALL PCP WHEN OFFICE IS OPEN: * You need to discuss this with your doctor (or NP/PA) within the next few days. * Call the office when it is open. CALL BACK IF: * Symptoms become worse.

## 2022-05-16 NOTE — Telephone Encounter (Signed)
See below

## 2022-05-16 NOTE — Telephone Encounter (Signed)
Please schedule OV to f/u on this.

## 2022-05-16 NOTE — Telephone Encounter (Signed)
Pt's daughter states the call was more about her not the pt. She has questions about his care and other concerns. She is asking for an appt to discuss this with [facility]. She is asking if it's possible to do a virtual or an appt this Wendesday or Thursday. Please advise

## 2022-05-16 NOTE — Telephone Encounter (Signed)
Opening Thursday at 9 20 or potentially could work in virtual at end of day- he would need to at least be present (cant be just her for visit about him)

## 2022-05-17 NOTE — Telephone Encounter (Signed)
See below

## 2022-05-18 NOTE — Telephone Encounter (Signed)
I have spoken with pt's daughter, [name]. She has decided to wait to talk to [facility]. She is checking with insurance to find out what kind of care her father can receive. He currently goes to a day program but she is trying to find him in-home care for the evenings and overnight. She will call back once she finds out what her options are.

## 2022-05-23 ENCOUNTER — Telehealth: Payer: Medicare Other

## 2022-05-25 ENCOUNTER — Other Ambulatory Visit: Payer: Self-pay | Admitting: Family Medicine

## 2022-05-25 DIAGNOSIS — R413 Other amnesia: Secondary | ICD-10-CM

## 2022-05-27 NOTE — Progress Notes (Signed)
Chronic Care Management Pharmacy Note  05/31/2022 Name:  SYLVAN SOOKDEO MRN:  [id number] DOB:  10/20/1936  Summary: PharmD FU.  Mr. Hashimi is staying with his daughter at her house currently while his is undergoing repairs.  She mentions stressors of this and increased aggression and some wandering outside of the apartment from him.  She is managing it herself currently  Recommendations/Changes made from today's visit: Consider the caregiver classes at [facility] that they are offering. Come in to see PCP if aggression worsens  Plan: FU 6 months   Subjective: KIM LAUVER is an 86 y.o. year old male who is a primary patient of Hunter, Brayton Mars, MD.  The CCM team was consulted for assistance with disease management and care coordination needs.    Engaged with patient face to face for follow up visit in response to provider referral for pharmacy case management and/or care coordination services.   Consent to Services:  The patient was given the following information about Chronic Care Management services today, agreed to services, and gave verbal consent: 1. CCM service includes personalized support from designated clinical staff supervised by the primary care provider, including individualized plan of care and coordination with other care providers 2. 24/7 contact phone numbers for assistance for urgent and routine care needs. 3. Service will only be billed when office clinical staff spend 20 minutes or more in a month to coordinate care. 4. Only one practitioner may furnish and bill the service in a calendar month. 5.The patient may stop CCM services at any time (effective at the end of the month) by phone call to the office staff. 6. The patient will be responsible for cost sharing (co-pay) of up to 20% of the service fee (after annual deductible is met). Patient agreed to services and consent obtained.  Patient Care Team: Marin Olp, MD as PCP - General (Family  Medicine) Edythe Clarity, [facility] as Pharmacist (Pharmacist)  Recent office visits:  10/26/2021 OV (PCP) Marin Olp, MD; start back taking iron since not on now.   09/09/2021 OV (PCP) Marin Olp, MD; no medication changes indicated   Recent consult visits:  07/19/2021 OV (Rad Onc) Gery Pray, MD; no medication changes indicated.   Hospital visits:  None in previous 6 months   Objective:  Lab Results  Component Value Date   CREATININE 1.47 03/15/2022   BUN 21 03/15/2022   GFR 43.78 (L) 03/15/2022   GFRNONAA 53 (L) 03/25/2021   GFRAA >60 06/21/2017   NA 145 03/15/2022   K 4.3 03/15/2022   CALCIUM 8.8 03/15/2022   CO2 24 03/15/2022   GLUCOSE 106 (H) 03/15/2022    Lab Results  Component Value Date/Time   HGBA1C 6.1 09/09/2021 02:15 PM   GFR 43.78 (L) 03/15/2022 11:43 AM   GFR 43.55 (L) 10/26/2021 01:46 PM    Last diabetic Eye exam: No results found for: HMDIABEYEEXA  Last diabetic Foot exam: No results found for: HMDIABFOOTEX   Lab Results  Component Value Date   CHOL 119 09/09/2021   HDL 31.90 (L) 09/09/2021   LDLCALC 81 10/12/2020   LDLDIRECT 58.0 09/09/2021   TRIG 223.0 (H) 09/09/2021   CHOLHDL 4 09/09/2021       Latest Ref Rng & Units 03/15/2022   11:43 AM 10/26/2021    1:46 PM 09/09/2021    2:15 PM  Hepatic Function  Total Protein 6.0 - 8.3 g/dL 6.5   6.9   6.5    Albumin  3.5 - 5.2 g/dL 3.7   3.7   3.6    AST 0 - 37 U/L _0 ALT 0 - 53 U/L _1 Alk Phosphatase 39 - 117 U/L 49   54   54    Total Bilirubin 0.2 - 1.2 mg/dL 0.3   0.3   0.3      Lab Results  Component Value Date/Time   TSH 1.00 06/18/2019 02:53 PM   TSH 0.62 08/01/2016 11:32 AM       Latest Ref Rng & Units 04/18/2022   10:28 AM 03/15/2022   11:43 AM 10/26/2021    1:46 PM  CBC  WBC 4.0 - 10.5 K/uL 7.5   7.5   7.9    Hemoglobin 13.0 - 17.0 g/dL 11.0   9.9   10.4    Hematocrit 39.0 - 52.0 % 35.2   31.7   32.9    Platelets 150.0 - 400.0 K/uL 203.0    217.0   201.0      No results found for: VD25OH  Clinical ASCVD: No  The ASCVD Risk score (Arnett DK, et al., 2019) failed to calculate for the following reasons:   The 2019 ASCVD risk score is only valid for ages 76 to 26       03/22/2021    3:26 PM 02/25/2021   10:11 AM 10/12/2020    3:49 PM  Depression screen PHQ 2/9  Decreased Interest 1 0 0  Down, Depressed, Hopeless 0 0 0  PHQ - 2 Score 1 0 0      Social History   Tobacco Use  Smoking Status Former   Packs/day: 0.50   Types: Cigarettes   Quit date: 12/26/1988   Years since quitting: 33.4  Smokeless Tobacco Never  Tobacco Comments   "I don't remember how long really" (02/08/2017)   BP Readings from Last 3 Encounters:  03/15/22 (!) 120/54  01/24/22 (!) 151/57  10/26/21 124/60   Pulse Readings from Last 3 Encounters:  03/15/22 (!) 58  01/24/22 63  10/26/21 63   Wt Readings from Last 3 Encounters:  03/15/22 230 lb 3.2 oz (104.4 kg)  01/24/22 228 lb (103.4 kg)  10/26/21 227 lb (103 kg)   BMI Readings from Last 3 Encounters:  03/15/22 36.05 kg/m  01/24/22 35.71 kg/m  10/26/21 35.55 kg/m    Assessment/Interventions: Review of patient past medical history, allergies, medications, health status, including review of consultants reports, laboratory and other test data, was performed as part of comprehensive evaluation and provision of chronic care management services.   SDOH:  (Social Determinants of Health) assessments and interventions performed: Yes  Financial Resource Strain: Not on file    SDOH Screenings   Alcohol Screen: Not on file  Depression (PHQ2-9): Not on file  Financial Resource Strain: Not on file  Food Insecurity: Not on file  Housing: Not on file  Physical Activity: Not on file  Social Connections: Not on file  Stress: Not on file  Tobacco Use: Medium Risk   Smoking Tobacco Use: Former   Smokeless Tobacco Use: Never   Passive Exposure: Not on file  Transportation Needs: Not on file     Cunningham [Memantine] Other (See Comments)    Made pt feel dizzy    Medications Reviewed Today     Reviewed by Edythe Clarity, [facility] (Pharmacist)  on 05/31/22 at 1124  Med List Status: <None>   Medication Order Taking? Sig Documenting Provider Last Dose Status Informant  amLODipine-olmesartan (AZOR) 5-40 MG tablet [id number] Yes TAKE 1 TABLET BY MOUTH DAILY Marin Olp, MD Taking Active   diclofenac Sodium (VOLTAREN) 1 % GEL [id number] Yes Apply 2 g topically 4 (four) times daily. Marin Olp, MD Taking Active   donepezil (ARICEPT) 10 MG tablet [id number] Yes TAKE ONE TABLET BY MOUTH EVERY DAY Marin Olp, MD Taking Active   ferrous sulfate 325 (65 FE) MG tablet [id number] Yes Take three times a week Marin Olp, MD Taking Active   Multiple Vitamin (MULTIVITAMIN) tablet [id number] Yes Take 1 tablet by mouth daily. Centrum silver [provider] Taking Active   rosuvastatin (CRESTOR) 20 MG tablet [id number] Yes TAKE ONE TABLET BY MOUTH EVERY DAY Marin Olp, MD Taking Active             Patient Active Problem List   Diagnosis Date Noted   Aortic atherosclerosis ([place]) 01/06/2018   History of lung cancer 09/18/2017   Pulmonary emphysema ([place])    Parotiditis 02/08/2017   Moderate dementia without behavioral disturbance ([place]) 07/17/2015   Hyperlipidemia 10/15/2014   Allergic rhinitis 10/15/2014   Overactive bladder 10/15/2014   CARDIAC MURMUR 01/10/2011   ORGANIC IMPOTENCE 08/12/2010   Gout of big toe 08/04/2008   Essential hypertension 07/25/2007   PROSTATE CANCER, HX OF 07/25/2007    Immunization History  Administered Date(s) Administered   Fluad Quad(high Dose 65+) 10/12/2020, 09/09/2021   Influenza Whole 11/06/2007, 10/07/2008, 12/09/2010   Influenza, High Dose Seasonal PF 08/22/2016, 11/07/2018   Influenza,inj,Quad PF,6+ Mos 10/15/2014   PFIZER(Purple Top)SARS-COV-2 Vaccination  05/07/2020, 05/27/2020   Pneumococcal Conjugate-13 05/08/2015   Pneumococcal Polysaccharide-23 11/06/2007   Td 12/26/2006, 05/03/2018    Conditions to be addressed/monitored:  HTN, Allergic Rhinitis, Dementia, HLD  Care Plan : General Pharmacy (Adult)  Updates made by Edythe Clarity, RPH since 05/31/2022 12:00 AM     Problem: HTN, Allergic Rhinitis, Dementia, HLD   Priority: High  Onset Date: 11/15/2021     Long-Range Goal: Patient-Specific Goal   Start Date: 11/15/2021  Expected End Date: 05/15/2022  Recent Progress: On track  Priority: High  Note:   Current Barriers:  Memory/aggression towards caregivers  Pharmacist Clinical Goal(s):  Patient will maintain control of BP/cholesterol as evidenced by labs/monitoring  through collaboration with PharmD and provider.   Interventions: 1:1 collaboration with Marin Olp, MD regarding development and update of comprehensive plan of care as evidenced by provider attestation and co-signature Inter-disciplinary care team collaboration (see longitudinal plan of care) Comprehensive medication review performed; medication list updated in electronic medical record  Hypertension (BP goal <140/90) -Controlled -Current treatment: Amlodipine-olmesartan 5-72m daily -Medications previously tried: lisinopril -Current home readings: not checking -Current dietary habits: eats breakfast and lunch at adult daycare, dinner is home cooked meal with meat/veggies/starch -Current exercise habits: minimal, chair exercises at adult daycare -Denies hypotensive/hypertensive symptoms -Educated on BP goals and benefits of medications for prevention of heart attack, stroke and kidney damage; Symptoms of hypotension and importance of maintaining adequate hydration; -Counseled to monitor BP at home if able once weekly, document, and provide log at future appointments -Recommended to continue current medication  Hyperlipidemia: (LDL goal <  100) -Controlled -Current treatment: Rosuvastatin 259mdaily Appropriate, Effective, Safe, Accessible -Medications previously tried: atorvastatin  -Current dietary patterns: see HTN -Current exercise habits: see HTN -Educated on Cholesterol goals;  Benefits of  statin for ASCVD risk reduction; Importance of limiting foods high in cholesterol; -Recommended to continue current medication Continue to monitor GFR as < 30 would need to switch statins  Update 05/31/22 Continues Crestor with no concerns LDL is controlled. Continue as current and recommend routine lipid panel  yearly. Continue to monitor GFR for dose adjustments.   Dementia (Goal: Prevent progression) -Controlled -Current treatment  Donepezil 54m daily Appropriate, Effective, Safe, Accessible -Medications previously tried: memantine (dizziness) -Takes med with no adverse effects, denies any GI symptoms  -Recommended to continue current medication Will set patient up with [facility] for medication packaging and synchronization to help with transportation and memory concern.  Update 05/31/22 Was not able to get him set up with delivery due to his locations and distance from  normal delivery routes. Currently, he is staying with his daughter at her house while his house is undergoing repairs.  [facility]  He is there for the majority of the day.  Daughter reports some aggression towards her and some wandering out of the house.  He is asking her repeated questions. She does not fear for her safety, however, mentions the difficulty in being is full time caregiver. Unsure of their future plans for living situation. Advised her if it gets worse or aggression increases to please come in to see Dr. HYong Channel No changes to meds at this time.  Patient Goals/Self-Care Activities Patient will:  - take medications as prescribed as evidenced by patient report and record review focus on  medication adherence by pill packs check blood pressure periodically, document, and provide at future appointments  Follow Up Plan: The care management team will reach out to the patient again over the next 180 days.            Medication Assistance: None required.  Patient affirms current coverage meets needs.  Compliance/Adherence/Medication fill history: Care Gaps: Shingles Vaccine  Star-Rating Drugs: Rosuvastatin 10/03/21 90ds  [id number]Phone: [phone]: [phone] [id number]: [phone]: [phone]  Uses pill box? Yes Pt endorses 100% compliance  We discussed: Benefits of medication synchronization, packaging and delivery as well as enhanced pharmacist oversight with [facility]. Patient decided to: Utilize [facility] for medication synchronization, packaging and delivery Verbal consent obtained for [facility] enhanced pharmacy services (medication synchronization, adherence packaging, delivery coordination). A medication sync plan was created to allow patient to get all medications delivered once every 30 to 90 days per patient preference. Patient understands they have freedom to choose pharmacy and clinical pharmacist will coordinate care between all prescribers and [facility].  Care Plan and Follow Up Patient Decision:  Patient agrees to Care Plan and Follow-up.  Plan: The care management team will reach out to the patient again over the next 180 days.  CBeverly Milch PharmD Clinical Pharmacist  [facility]([phone]

## 2022-05-31 ENCOUNTER — Ambulatory Visit: Payer: Medicare Other | Admitting: Pharmacist

## 2022-05-31 DIAGNOSIS — F03B0 Unspecified dementia, moderate, without behavioral disturbance, psychotic disturbance, mood disturbance, and anxiety: Secondary | ICD-10-CM

## 2022-05-31 DIAGNOSIS — E785 Hyperlipidemia, unspecified: Secondary | ICD-10-CM

## 2022-05-31 NOTE — Patient Instructions (Addendum)
Visit Information   Goals Addressed             This Visit's Progress    Manage My Medicine   On track    Timeframe:  Long-Range Goal Priority:  High Start Date:  11/15/21                           Expected End Date:  05/15/22                     Follow Up Date 02/15/22    - keep a list of all the medicines I take; vitamins and herbals too  -Maintain adherence using [facility] packaging   Why is this important?   These steps will help you keep on track with your medicines.   Notes:        Patient Care Plan: General Pharmacy (Adult)     Problem Identified: HTN, Allergic Rhinitis, Dementia, HLD   Priority: High  Onset Date: 11/15/2021     Long-Range Goal: Patient-Specific Goal   Start Date: 11/15/2021  Expected End Date: 05/15/2022  Recent Progress: On track  Priority: High  Note:   Current Barriers:  Memory/aggression towards caregivers  Pharmacist Clinical Goal(s):  Patient will maintain control of BP/cholesterol as evidenced by labs/monitoring  through collaboration with PharmD and provider.   Interventions: 1:1 collaboration with Marin Olp, MD regarding development and update of comprehensive plan of care as evidenced by provider attestation and co-signature Inter-disciplinary care team collaboration (see longitudinal plan of care) Comprehensive medication review performed; medication list updated in electronic medical record  Hypertension (BP goal <140/90) -Controlled -Current treatment: Amlodipine-olmesartan 5-40mg  daily -Medications previously tried: lisinopril -Current home readings: not checking -Current dietary habits: eats breakfast and lunch at adult daycare, dinner is home cooked meal with meat/veggies/starch -Current exercise habits: minimal, chair exercises at adult daycare -Denies hypotensive/hypertensive symptoms -Educated on BP goals and benefits of medications for prevention of heart attack, stroke and kidney damage; Symptoms of  hypotension and importance of maintaining adequate hydration; -Counseled to monitor BP at home if able once weekly, document, and provide log at future appointments -Recommended to continue current medication  Hyperlipidemia: (LDL goal < 100) -Controlled -Current treatment: Rosuvastatin 20mg  daily Appropriate, Effective, Safe, Accessible -Medications previously tried: atorvastatin  -Current dietary patterns: see HTN -Current exercise habits: see HTN -Educated on Cholesterol goals;  Benefits of statin for ASCVD risk reduction; Importance of limiting foods high in cholesterol; -Recommended to continue current medication Continue to monitor GFR as < 30 would need to switch statins  Update 05/31/22 Continues Crestor with no concerns LDL is controlled. Continue as current and recommend routine lipid panel  yearly. Continue to monitor GFR for dose adjustments.   Dementia (Goal: Prevent progression) -Controlled -Current treatment  Donepezil 10mg  daily Appropriate, Effective, Safe, Accessible -Medications previously tried: memantine (dizziness) -Takes med with no adverse effects, denies any GI symptoms  -Recommended to continue current medication Will set patient up with [facility] for medication packaging and synchronization to help with transportation and memory concern.  Update 05/31/22 Was not able to get him set up with delivery due to his locations and distance from  normal delivery routes. Currently, he is staying with his daughter at her house while his house is undergoing repairs.  [facility].  He is there for the majority of the day.  Daughter reports some aggression towards her and some  wandering out of the house.  He is asking her repeated questions. She does not fear for her safety, however, mentions the difficulty in being is full time caregiver. Unsure of their future plans for living situation. Advised her if it gets worse or  aggression increases to please come in to see Dr. Yong Channel. No changes to meds at this time.  Patient Goals/Self-Care Activities Patient will:  - take medications as prescribed as evidenced by patient report and record review focus on medication adherence by pill packs check blood pressure periodically, document, and provide at future appointments  Follow Up Plan: The care management team will reach out to the patient again over the next 180 days.           The patient verbalized understanding of instructions, educational materials, and care plan provided today and DECLINED offer to receive copy of patient instructions, educational materials, and care plan.  Telephone follow up appointment with pharmacy team member scheduled for: 6 months  Edythe Clarity, [place], PharmD Clinical Pharmacist  [facility] [phone]

## 2022-07-08 ENCOUNTER — Telehealth: Payer: Self-pay | Admitting: Pharmacist

## 2022-07-08 NOTE — Progress Notes (Signed)
Chronic Care Management Pharmacy Assistant   Name: Jerry [name]  MRN: [id number] DOB: 1936/02/06   Reason for Encounter: General Adherence Call    Recent office visits:  None  Recent consult visits:  None  Hospital visits:  None in previous 6 months  Medications: Outpatient Encounter Medications as of 07/08/2022  Medication Sig   amLODipine-olmesartan (AZOR) 5-40 MG tablet TAKE 1 TABLET BY MOUTH DAILY   diclofenac Sodium (VOLTAREN) 1 % GEL Apply 2 g topically 4 (four) times daily.   donepezil (ARICEPT) 10 MG tablet TAKE ONE TABLET BY MOUTH EVERY DAY   ferrous sulfate 325 (65 FE) MG tablet Take three times a week   Multiple Vitamin (MULTIVITAMIN) tablet Take 1 tablet by mouth daily. Centrum silver   rosuvastatin (CRESTOR) 20 MG tablet TAKE ONE TABLET BY MOUTH EVERY DAY   No facility-administered encounter medications on file as of 07/08/2022.  Contacted Jerry [name] for General Review Call   Chart Review:  Have there been any documented new, changed, or discontinued medications since last visit?  (If yes, include name, dose, frequency, date) Has there been any documented recent hospitalizations or ED visits since last visit with Clinical Pharmacist?  Brief Summary (including medication and/or Diagnosis changes):   Adherence Review:  Does the Clinical Pharmacist Assistant have access to adherence rates?  Adherence rates for STAR metric medications (List medication(s)/day supply/ last 2 fill dates). Adherence rates for medications indicated for disease state being reviewed (List medication(s)/day supply/ last 2 fill dates). Does the patient have >5 day gap between last estimated fill dates for any of the above medications or other medication gaps?  Reason for medication gaps.   Disease State Questions:  Able to connect with Patient?  Did patient have any problems with their health recently? Note problems and Concerns: Have you had any admissions or  emergency room visits or worsening of your condition(s) since last visit?  Details of ED visit, hospital visit and/or worsening condition(s): Have you had any visits with new specialists or providers since your last visit?  Explain: Have you had any new health care problem(s) since your last visit?  New problem(s) reported: Have you run out of any of your medications since you last spoke with clinical pharmacist?  What caused you to run out of your medications? Are there any medications you are not taking as prescribed?  What kept you from taking your medications as prescribed? Are you having any issues or side effects with your medications? Note of issues or side effects: Do you have any other health concerns or questions you want to discuss with your Clinical Pharmacist before your next visit?  Note additional concerns and questions from Patient. Are there any health concerns that you feel we can do a better job addressing?  Note Patient's response. Are you having any problems with any of the following since the last visit: (select all that apply  Details: 12. Any falls since last visit?   Details: 13. Any increased or uncontrolled pain since last visit?   Details: 14. Next visit Type:       Visit with:        Date:        Time:  75. Additional Details?   **Unsuccessful attempt at reaching patient to complete this call**   Care Gaps: Medicare Annual Wellness: Completed 03/28/2022 Hemoglobin A1C: 6.1% on 09/09/2021  Future Appointments  Date Time Provider [place]  07/25/2022  3:15 PM Gery Pray, MD [facility]  None  12/13/2022  9:00 AM [facility] CCM PHARMACIST [facility]   Star Rating Drugs: Amlodipine-Olmesartan 40 mg last filled 04/11/2022 90 DS Rosuvastatin 20 mg last filled 06/06/2022 90 DS  April D Calhoun, [place] Pharmacist Assistant [phone]

## 2022-07-11 ENCOUNTER — Telehealth: Payer: Self-pay | Admitting: *Deleted

## 2022-07-11 NOTE — Telephone Encounter (Signed)
CONTINUOUS OF PREVIOUS NOTE, PATIENT TO RECEIVE RESULTS FROM DR. KINARD ON 07-25-22 @ 3:15 PM, SPOKE WITH PATIENT'S DAUGHTER GELICIA [name] AND SHE IS AWARE OF THESE APPTS.

## 2022-07-11 NOTE — Telephone Encounter (Signed)
Called patient's daughter Estrella Myrtle) to inform of  her dad's Ct for 07-22-22- arrival time- 1:45 pm @ [facility] no restrictions to test, patient to receive results from Dr. Sondra Come

## 2022-07-12 ENCOUNTER — Other Ambulatory Visit: Payer: Self-pay | Admitting: Family Medicine

## 2022-07-18 ENCOUNTER — Encounter: Payer: Self-pay | Admitting: Family Medicine

## 2022-07-18 ENCOUNTER — Telehealth: Payer: Self-pay | Admitting: Family Medicine

## 2022-07-18 ENCOUNTER — Ambulatory Visit: Payer: Medicare Other | Admitting: Family Medicine

## 2022-07-18 VITALS — BP 122/60 | HR 62 | Temp 98.5°F | Ht 67.0 in | Wt 228.1 lb

## 2022-07-18 DIAGNOSIS — R350 Frequency of micturition: Secondary | ICD-10-CM

## 2022-07-18 DIAGNOSIS — F03B0 Unspecified dementia, moderate, without behavioral disturbance, psychotic disturbance, mood disturbance, and anxiety: Secondary | ICD-10-CM

## 2022-07-18 LAB — POCT URINALYSIS DIPSTICK
Bilirubin, UA: POSITIVE
Blood, UA: NEGATIVE
Clarity, UA: NEGATIVE
Glucose, UA: NEGATIVE
Ketones, UA: NEGATIVE
Leukocytes, UA: NEGATIVE
Nitrite, UA: NEGATIVE
Protein, UA: NEGATIVE
Spec Grav, UA: >=1.03 — AB (ref 1.010–1.025)
Urobilinogen, UA: 0.2 E.U./dL
pH, UA: 5 (ref 5.0–8.0)

## 2022-07-18 NOTE — Patient Instructions (Addendum)
Work on regular bathroom breaks.   Flavor water,  popsicles/icees.  Let us know-will consider meds for the urine.

## 2022-07-18 NOTE — Telephone Encounter (Signed)
.  Type of form received: Long Term FL2 Form  Additional comments:   Received by: Adonis Brook  Form should be Faxed to:  Form should be mailed to:    Is patient requesting call for pickup: yes   Form placed:  In provider's box  Attach charge sheet. yes  Individual made aware of 3-5 business day turn around (Y/N)? yes

## 2022-07-18 NOTE — Telephone Encounter (Signed)
Pt's daughter states pt has been having issues with his balance while walking up and down stairs and she is concerned. She is asking if he may need to have a head CT or MRI. Please advise

## 2022-07-18 NOTE — Progress Notes (Signed)
Subjective:     Patient ID: Jerry [name], male    DOB: [birth year], 86 y.o.   MRN: [id number]  Chief Complaint  Patient presents with   Memory issues   Urinary Incontinence    Have been having frequent accidents recently, make sure he doesn't have a UTI    HPI-here w/dau from MI(who comes down in summer to care for him). Dementia-going to [facility] and more urinary incont problems.  Occ depends.  No dysuria.  Some urgency.  Some pressure when has to go.  [place] Kyla Balzarine go and sometimes not getting there.  [facility] wanted dau to get him checked for UTI.    Dementia-Has to get more ques to do tasks-bathing, eating, etc.  memory worsening. Considering long term care.  May need some short term help.   There are no preventive care reminders to display for this patient.  Past Medical History:  Diagnosis Date   Cancer of prostate ([place]) 2001   Chronic kidney disease (CKD), stage III (moderate) ([place])    /notes 02/08/2017   Dementia ([place])    DIVERTICULOSIS, COLON 07/25/2007   Qualifier: Diagnosis of  By: [name], LPN, [name]    High cholesterol    History of radiation therapy 05/01/17-05/09/17   right lung 54 Gy in 3 fractions   History of radiation therapy    prostate radioactive seed placement 07/2000  Dr Valere Dross   Hypertension    Lung cancer ([place]) 03/2017    Past Surgical History:  Procedure Laterality Date   DIRECT LARYNGOSCOPY N/A 06/21/2017   Procedure: DIRECT LARYNGOSCOPY;  Surgeon: Melissa Montane, MD;  Location: [place];  Service: ENT;  Laterality: N/A;   EYE SURGERY     INSERTION PROSTATE RADIATION SEED  07/2000   [name] 05/09/2011   PENILE PROSTHESIS IMPLANT  02/2002   Insertion of Mentor three piece inflatable penile prosthesis./notes 05/09/2011   PENILE PROSTHESIS PLACEMENT  06/2003   Placement of Mentor rod prosthesis/notes 05/09/2011   REFRACTIVE SURGERY Bilateral    REMOVAL OF PENILE PROSTHESIS  06/2003   Removal of three-piece Mentor prosthesis/notes 05/09/2011    SCROTAL SURGERY  06/2003   Repair of scrotal defect/notes 05/09/2011    Outpatient Medications Prior to Visit  Medication Sig Dispense Refill   amLODipine-olmesartan (AZOR) 5-40 MG tablet TAKE ONE TABLET BY MOUTH EVERY DAY 90 tablet 0   diclofenac Sodium (VOLTAREN) 1 % GEL Apply 2 g topically 4 (four) times daily. 100 g 3   donepezil (ARICEPT) 10 MG tablet TAKE ONE TABLET BY MOUTH EVERY DAY 90 tablet 1   ferrous sulfate 325 (65 FE) MG tablet Take three times a week 40 tablet 3   Multiple Vitamin (MULTIVITAMIN) tablet Take 1 tablet by mouth daily. Centrum silver     rosuvastatin (CRESTOR) 20 MG tablet TAKE ONE TABLET BY MOUTH EVERY DAY 90 tablet 1   No facility-administered medications prior to visit.    Allergies  Allergen Reactions   Namenda [Memantine] Other (See Comments)    Made pt feel dizzy   ROS neg/noncontributory except as noted HPI/below Can't get him to drink water.       Objective:     BP 122/60   Pulse 62   Temp 98.5 F (36.9 C) (Temporal)   Ht 5\' 7"  (1.702 m)   Wt 228 lb 2 oz (103.5 kg)   SpO2 98%   BMI 35.73 kg/m  Wt Readings from Last 3 Encounters:  07/18/22 228 lb 2 oz (103.5 kg)  03/15/22 230 lb 3.2 oz (104.4 kg)  01/24/22 228 lb (103.4 kg)    Physical Exam   Gen: WDWN NAD HEENT: NCAT, conjunctiva not injected, sclera nonicteric NECK:  supple, no thyromegaly, no nodes, no carotid bruits CARDIAC: RRR, S1S2+,1/6 murmur.  LUNGS: CTAB. No wheezes ABDOMEN:  BS+, soft, NTND, No HSM, no masses EXT:  no edema MSK: no gross abnormalities.  NEURO: A&O.  CN II-XII intact.  PSYCH: normal mood. Good eye contact  Results for orders placed or performed in visit on 07/18/22  POCT urinalysis dipstick  Result Value Ref Range   Color, UA yellow    Clarity, UA cleat neg    Glucose, UA Negative Negative   Bilirubin, UA positive    Ketones, UA neg    Spec Grav, UA >=1.030 (A) 1.010 - 1.025   Blood, UA neg    pH, UA 5.0 5.0 - 8.0   Protein, UA Negative  Negative   Urobilinogen, UA 0.2 0.2 or 1.0 E.U./dL   Nitrite, UA neg    Leukocytes, UA Negative Negative   Appearance     Odor      Spent 35 minutes getting history from daughter, listening to concerns, hearing some of her plans, discussing options and deciding on a plan.    Assessment & Plan:   Problem List Items Addressed This Visit       Nervous and Auditory   Moderate dementia without behavioral disturbance ([place])   Other Visit Diagnoses     Frequent urination    -  Primary   Relevant Orders   POCT urinalysis dipstick (Completed)   Urine Culture     1.  Urinary frequency-chronic, but worsening.  UA was negative for UTI, however is very concentrated.  Will check culture to make sure no infection.  He does have a history of prostate cancer that was treated with radiation.  Discussed pros and cons/side effects of medications.  Daughter and I agree that they will first try scheduled bathroom breaks.  Try to encourage the use of depends more.  [facility] wants him to wear sweatpants so he can get his pants down quicker, however daughter states that he enjoys wearing the clothes he already is, and this would cause more mental anguish if we change his clothing.  She will let us know if they want to do medication.  Declined any further labs today (I agree) 2.  Dementia-progressively worsening.  [facility].  Daughter is looking into more long-term care.  She may also needs some help with short-term home care.  Her daughter may come live with the patient to do some basic assistance.  Discussed a downhill slope of dementia.  Sounds like he is on this path.  No orders of the defined types were placed in this encounter.   Wellington Hampshire, MD

## 2022-07-18 NOTE — Telephone Encounter (Signed)
Please schedule OV in order for this to be assessed.

## 2022-07-19 LAB — URINE CULTURE
MICRO NUMBER:: 13685578
SPECIMEN QUALITY:: ADEQUATE

## 2022-07-19 NOTE — Telephone Encounter (Signed)
Pt scheduled for 07/27

## 2022-07-21 ENCOUNTER — Ambulatory Visit: Payer: Medicare Other | Admitting: Family Medicine

## 2022-07-22 ENCOUNTER — Telehealth: Payer: Self-pay | Admitting: Family Medicine

## 2022-07-22 ENCOUNTER — Ambulatory Visit (HOSPITAL_COMMUNITY): Payer: Medicare Other

## 2022-07-22 NOTE — Telephone Encounter (Signed)
Called patient's daughter to inform them that an OV will be needing for the [facility] form completion. Upon stating this, the daughter informed me that she will have to hold off on scheduling for now due to needing to speak with her sister about the need for long term care for their father.

## 2022-07-22 NOTE — Telephone Encounter (Signed)
Noted, will hold on to form.

## 2022-07-25 ENCOUNTER — Ambulatory Visit: Payer: Medicare Other | Admitting: Radiation Oncology

## 2022-07-27 ENCOUNTER — Telehealth: Payer: Self-pay | Admitting: *Deleted

## 2022-07-27 ENCOUNTER — Ambulatory Visit (HOSPITAL_COMMUNITY): Payer: Medicare Other

## 2022-07-27 NOTE — Telephone Encounter (Signed)
Called patient's daughter Jamelle Haring) to inform of CT being rescheduled for 08-04-22- arrival time- 4 pm @ [facility], no restrictions to test, patient to receive results from Dr. Sondra Come on 08-11-22 @ 4 pm, lvm for a return call

## 2022-07-28 ENCOUNTER — Telehealth: Payer: Self-pay | Admitting: Family Medicine

## 2022-07-28 ENCOUNTER — Ambulatory Visit
Admission: RE | Admit: 2022-07-28 | Discharge: 2022-07-28 | Disposition: A | Discharge status: Home / Self Care | Payer: Medicare Other | Source: Ambulatory Visit | Attending: Radiation Oncology | Admitting: Radiation Oncology

## 2022-07-28 NOTE — Telephone Encounter (Signed)
Error

## 2022-07-28 NOTE — Telephone Encounter (Signed)
I believe Jerry [name] has POA from prior conversations- she would have decision making power but family would certainly be able to weigh in with her. I am not aware of any orders I need to sign for patient to leave state. Office visit is reasonable given other message abotu worsenign symptoms

## 2022-07-28 NOTE — Telephone Encounter (Signed)
Caller states: -Patient's dementia has seemingly become worse recently.  - Patient is now needing to hear each step in order to get dressed properly and requires more attention and assistance   Caller requests:  - Additional hours in home health due to patient's dementia worsening

## 2022-07-28 NOTE — Telephone Encounter (Signed)
FYI

## 2022-07-28 NOTE — Telephone Encounter (Signed)
Caller states: - She has questions for Dr. Yong Channel in regards to patient's care and who is able to officially make these decisions.  - Wanted to know if a PCP had to sign in order for a dementia patient to be taken out of state.  - She would not like for her sister to know if she has called to check in on patient's care  Patient requests: - A telephone call with Dr. Yong Channel to address patient's care   I informed patient that most of these questions can be answered by medical records. Also informed patient that she is more than welcome to make an OV with patient present to discuss further recommendations for patient's care. I provided caller with medical records contact info. States she will callback if she would like an OV.

## 2022-07-28 NOTE — Telephone Encounter (Signed)
Not sure which agency is helping but if dementia is worsening- ok to provide order to home health to reevaluate and see if patient qualifies for additional hours

## 2022-07-29 NOTE — Telephone Encounter (Signed)
Ok to schedule virtual to discuss pt care.

## 2022-08-02 ENCOUNTER — Ambulatory Visit (INDEPENDENT_AMBULATORY_CARE_PROVIDER_SITE_OTHER): Payer: Medicare Other | Admitting: Family Medicine

## 2022-08-02 ENCOUNTER — Encounter: Payer: Self-pay | Admitting: Family Medicine

## 2022-08-02 VITALS — BP 112/60 | HR 62 | Temp 98.6°F | Ht 67.0 in | Wt 225.4 lb

## 2022-08-02 DIAGNOSIS — G301 Alzheimer's disease with late onset: Secondary | ICD-10-CM | POA: Diagnosis not present

## 2022-08-02 DIAGNOSIS — Z8546 Personal history of malignant neoplasm of prostate: Secondary | ICD-10-CM

## 2022-08-02 DIAGNOSIS — Z85118 Personal history of other malignant neoplasm of bronchus and lung: Secondary | ICD-10-CM | POA: Diagnosis not present

## 2022-08-02 DIAGNOSIS — I10 Essential (primary) hypertension: Secondary | ICD-10-CM

## 2022-08-02 DIAGNOSIS — E785 Hyperlipidemia, unspecified: Secondary | ICD-10-CM | POA: Diagnosis not present

## 2022-08-02 DIAGNOSIS — F02C0 Dementia in other diseases classified elsewhere, severe, without behavioral disturbance, psychotic disturbance, mood disturbance, and anxiety: Secondary | ICD-10-CM | POA: Diagnosis not present

## 2022-08-02 DIAGNOSIS — R32 Unspecified urinary incontinence: Secondary | ICD-10-CM | POA: Diagnosis not present

## 2022-08-02 NOTE — Patient Instructions (Addendum)
Flu shot- we should have these available within a month or two but please let us know if you get at outside pharmacy  We will call you within two weeks about your referral to home health- asking for PT/OT/aide. If you do not hear within 2 weeks, give [place] a call.   Recommended follow up: Return for followup or sooner if needed.Schedule b4 you leave.

## 2022-08-02 NOTE — Progress Notes (Signed)
Phone [phone] In person visit   Subjective:   Jerry [name] is a 86 y.o. year old very pleasant male patient who presents for/with See problem oriented charting Chief Complaint  Patient presents with   Dementia    Pt is here to f/u on worsening dementia, see recent phone notes from pt other daughter and extended hours for home health.   Past Medical History-  Patient Active Problem List   Diagnosis Date Noted   History of lung cancer 09/18/2017    Priority: High   Severe dementia ([name]) 07/17/2015    Priority: High   Hyperlipidemia 10/15/2014    Priority: Medium    Gout of big toe 08/04/2008    Priority: Medium    Essential hypertension 07/25/2007    Priority: Medium    PROSTATE CANCER, HX OF 07/25/2007    Priority: Medium    Aortic atherosclerosis ([name]) 01/06/2018    Priority: Low   Pulmonary emphysema ([name])     Priority: Low   Parotiditis 02/08/2017    Priority: Low   Allergic rhinitis 10/15/2014    Priority: Low   Overactive bladder 10/15/2014    Priority: Low   CARDIAC MURMUR 01/10/2011    Priority: Low   ORGANIC IMPOTENCE 08/12/2010    Priority: Low   Urinary incontinence 08/02/2022    Medications- reviewed and updated Current Outpatient Medications  Medication Sig Dispense Refill   amLODipine-olmesartan (AZOR) 5-40 MG tablet TAKE ONE TABLET BY MOUTH EVERY DAY 90 tablet 0   diclofenac Sodium (VOLTAREN) 1 % GEL Apply 2 g topically 4 (four) times daily. 100 g 3   donepezil (ARICEPT) 10 MG tablet TAKE ONE TABLET BY MOUTH EVERY DAY 90 tablet 1   ferrous sulfate 325 (65 FE) MG tablet Take three times a week 40 tablet 3   Multiple Vitamin (MULTIVITAMIN) tablet Take 1 tablet by mouth daily. Centrum silver     rosuvastatin (CRESTOR) 20 MG tablet TAKE ONE TABLET BY MOUTH EVERY DAY 90 tablet 1   No current facility-administered medications for this visit.     Objective:  BP 112/60   Pulse 62   Temp 98.6 F (37 C)   Ht 5\' 7"  (1.702 m)   Wt 225 lb 6.4  oz (102.2 kg)   SpO2 96%   BMI 35.30 kg/m  Gen: NAD, resting comfortably CV: RRR no murmurs rubs or gallops Lungs: CTAB no crackles, wheeze, rhonchi Skin: warm, dry Neuro: Follows basic instructions, limited dialogue    Assessment and Plan   #Social update/current and long term disposition- has worked with Education officer, museum through [employer] she believes- does Designer, fashion/clothing with [name] and Engineering geologist (here today). A lot of pressure on Gloria to care of grandbabies plus caring for her dad per [place] report of discussion from social work- reports can be more confrontational with patient as [name] suspects he can remember/handle more than he can and has additional stressors. As a result would like to transition to another short term disposition- apparently have 35 potential hours available for home health aide care - considering long term- [place], friends home- medicaid long term -daughter of glecia (granddaughter Darnelle Spangle) can stay overnight with him and help into morning and get [facility] to [facility] from 9 am to 4 pm- would need coverage in the evening each day 4 pm to 8 pm until she gets home (would be working in those hours) plus some additional weekend hours - will request home health aide plus with  as noted under dementia generalized weakness/trouble with stairs- will request PT/OT- troubel with right foot which has been long term issues (we did not opt to scan brain at this time)  #Dementia S: Medication: Donepezil 10 mg -Did not tolerate Namenda due to dizziness -Neurology: Previously seen by Dr. Jerene Pitch visit in 2017 -he is not noticing that home is his own- and questioning leaving- sometimes some frustration if others leaving- particularly daughter [name] -forgetfulness needs repeat every minute- had been 5-10 minutes  -steps 7 up and then landing then 7 more additional steps- has fallen a few times on the stairs coming down  even with holding on-  seems more steady going up .  - right foot has hard time lifting up - this has been a long term issue- no acute change  -daughter Cristopher Estimable in town since June - has noted deterioration (with him today) -needing a lot more ques for specific steps- could say shower before - but now needs step by step instructions  -eating is one of few things does not need step by step instructions  - family has noted more incontinence- sitter has called- they have had to change clothes several times- tested for UTI on 07/18/22 -scheduling visits more to the bathroom helped somewhat with incontinence  A/P: Dementia continues to progress-listed as severe now-suspect alcohol-related-continue current medication-see discussion above but believe needs to look into either memory care (comprehensive) or skilled nursing-does not appear with current set up that patient will be able to be cared for long-term at home-at least in the short-term granddaughter can help  -Not sure patient would be able to participate but could trial updating MMSE in the future -Considerable decline at home could not even knowing that it is his home and time.  Patient seems sleepy through most of the exam today though will follow basic instructions-in the past has done a lot of joking and visits but basically only short sentences and following basic instructions today  #hypertension S: medication: Amlodipine-olmesartan 5-40 mg BP Readings from Last 3 Encounters:  08/02/22 112/60  07/18/22 122/60  03/15/22 (!) 120/54   A/P:  Controlled. Continue current medications.    #hyperlipidemia #Aortic atherosclerosis S: Medication: Rosuvastatin 20 mg Lab Results  Component Value Date   CHOL 119 09/09/2021   HDL 31.90 (L) 09/09/2021   LDLCALC 81 10/12/2020   LDLDIRECT 58.0 09/09/2021   TRIG 223.0 (H) 09/09/2021   CHOLHDL 4 09/09/2021    A/P: Lipid goal 70 or less bad cholesterol and has been below goal for aortic atherosclerosis (presumed stable).   Reasonable control x2-continue current medication  #on iron for iron deficiency anemia- we realize could have some underlying issues but withoverall health do not want to investigate further- [place] ok with this  #History of lung cancer-annual imaging with Dr. Iantha Fallen recently 01/19/2022-and scheduled in August for repeat   #History of prostate cancer-follows with [facility] urology yearly in the past-have opted to discontinue follow-up with them- thankfully PSAs normal Lab Results  Component Value Date   PSA 0.20 03/15/2022   PSA 0.20 09/09/2021   PSA 0.11 10/12/2020    Recommended follow up: Return for followup or sooner if needed.Schedule b4 you leave. Future Appointments  Date Time Provider [place]  08/04/2022  4:30 PM [facility] 1 [facility] [place]  08/11/2022  4:00 PM Gery Pray, MD [facility] None  12/13/2022  9:00 AM [facility] PHARMACIST [facility]    Lab/Order associations:   ICD-10-CM   1. Severe late  onset Alzheimer's dementia without behavioral disturbance, psychotic disturbance, mood disturbance, or anxiety ([place])  G30.1 Ambulatory referral to [place].C0     2. Urinary incontinence, unspecified type  R32     3. Hyperlipidemia, unspecified hyperlipidemia type  E78.5     4. Essential hypertension  I10     5. PROSTATE CANCER, HX OF  Z85.46     6. History of lung cancer  Z85.118       Time Spent: 51 minutes of total time (11:40 AM- 12:31 PM) was spent on the date of the encounter performing the following actions: chart review prior to seeing the patient, obtaining history and discussing both short-term and long-term disposition for patient with family,  performing a medically necessary exam, counseling on the treatment plan and once again long term disposition, and documenting in our EHR.    Return precautions advised.  Garret Reddish, MD

## 2022-08-04 ENCOUNTER — Ambulatory Visit (HOSPITAL_COMMUNITY)
Admission: RE | Admit: 2022-08-04 | Discharge: 2022-08-04 | Disposition: A | Discharge status: Home / Self Care | Payer: Medicare Other | Source: Ambulatory Visit | Attending: Radiation Oncology | Admitting: Radiation Oncology

## 2022-08-04 DIAGNOSIS — R911 Solitary pulmonary nodule: Secondary | ICD-10-CM | POA: Insufficient documentation

## 2022-08-04 DIAGNOSIS — C3490 Malignant neoplasm of unspecified part of unspecified bronchus or lung: Secondary | ICD-10-CM | POA: Diagnosis not present

## 2022-08-08 ENCOUNTER — Ambulatory Visit: Payer: Medicare Other | Admitting: Family Medicine

## 2022-08-08 ENCOUNTER — Ambulatory Visit: Payer: Medicare Other | Admitting: Radiation Oncology

## 2022-08-11 ENCOUNTER — Ambulatory Visit
Admission: RE | Admit: 2022-08-11 | Discharge: 2022-08-11 | Disposition: A | Discharge status: Home / Self Care | Payer: Medicare Other | Source: Ambulatory Visit | Attending: Radiation Oncology | Admitting: Radiation Oncology

## 2022-08-12 ENCOUNTER — Other Ambulatory Visit: Payer: Self-pay | Admitting: Radiation Oncology

## 2022-08-12 DIAGNOSIS — R911 Solitary pulmonary nodule: Secondary | ICD-10-CM

## 2022-09-01 ENCOUNTER — Other Ambulatory Visit: Payer: Self-pay | Admitting: Family Medicine

## 2022-09-01 DIAGNOSIS — R413 Other amnesia: Secondary | ICD-10-CM

## 2022-09-17 ENCOUNTER — Encounter (HOSPITAL_COMMUNITY): Payer: Self-pay

## 2022-09-17 ENCOUNTER — Other Ambulatory Visit: Payer: Self-pay

## 2022-09-17 ENCOUNTER — Emergency Department (HOSPITAL_COMMUNITY)
Admission: EM | Admit: 2022-09-17 | Discharge: 2022-09-17 | Disposition: A | Discharge status: Home / Self Care | Payer: Medicare Other | Attending: Emergency Medicine | Admitting: Emergency Medicine

## 2022-09-17 ENCOUNTER — Emergency Department (HOSPITAL_COMMUNITY): Payer: Medicare Other

## 2022-09-17 DIAGNOSIS — F0390 Unspecified dementia without behavioral disturbance: Secondary | ICD-10-CM | POA: Diagnosis not present

## 2022-09-17 DIAGNOSIS — Z85118 Personal history of other malignant neoplasm of bronchus and lung: Secondary | ICD-10-CM | POA: Insufficient documentation

## 2022-09-17 DIAGNOSIS — Z79899 Other long term (current) drug therapy: Secondary | ICD-10-CM | POA: Diagnosis not present

## 2022-09-17 DIAGNOSIS — N183 Chronic kidney disease, stage 3 unspecified: Secondary | ICD-10-CM | POA: Insufficient documentation

## 2022-09-17 DIAGNOSIS — Z87891 Personal history of nicotine dependence: Secondary | ICD-10-CM | POA: Diagnosis not present

## 2022-09-17 DIAGNOSIS — R079 Chest pain, unspecified: Secondary | ICD-10-CM | POA: Diagnosis not present

## 2022-09-17 DIAGNOSIS — R0789 Other chest pain: Secondary | ICD-10-CM

## 2022-09-17 DIAGNOSIS — Z8546 Personal history of malignant neoplasm of prostate: Secondary | ICD-10-CM | POA: Insufficient documentation

## 2022-09-17 DIAGNOSIS — I129 Hypertensive chronic kidney disease with stage 1 through stage 4 chronic kidney disease, or unspecified chronic kidney disease: Secondary | ICD-10-CM | POA: Insufficient documentation

## 2022-09-17 DIAGNOSIS — R0602 Shortness of breath: Secondary | ICD-10-CM | POA: Diagnosis not present

## 2022-09-17 LAB — TROPONIN I (HIGH SENSITIVITY)
Troponin I (High Sensitivity): 10 ng/L (ref <18)
Troponin I (High Sensitivity): 10 ng/L (ref <18)

## 2022-09-17 LAB — CBC
HCT: 43.7 % (ref 39.0–52.0)
Hemoglobin: 14.1 g/dL (ref 13.0–17.0)
MCH: 29 pg (ref 26.0–34.0)
MCHC: 32.3 g/dL (ref 30.0–36.0)
MCV: 89.7 fL (ref 80.0–100.0)
Platelets: 181 10*3/uL (ref 150–400)
RBC: 4.87 MIL/uL (ref 4.22–5.81)
RDW: 15.9 % — ABNORMAL HIGH (ref 11.5–15.5)
WBC: 7.9 10*3/uL (ref 4.0–10.5)
nRBC: 0 % (ref 0.0–0.2)

## 2022-09-17 LAB — BASIC METABOLIC PANEL
Anion gap: 5 (ref 5–15)
BUN: 17 mg/dL (ref 8–23)
CO2: 25 mmol/L (ref 22–32)
Calcium: 9.1 mg/dL (ref 8.9–10.3)
Chloride: 113 mmol/L — ABNORMAL HIGH (ref 98–111)
Creatinine, Ser: 1.5 mg/dL — ABNORMAL HIGH (ref 0.61–1.24)
GFR, Estimated: 46 mL/min — ABNORMAL LOW (ref >60)
Glucose, Bld: 92 mg/dL (ref 70–99)
Potassium: 4.6 mmol/L (ref 3.5–5.1)
Sodium: 143 mmol/L (ref 135–145)

## 2022-09-17 MED ORDER — ACETAMINOPHEN 500 MG PO TABS
1000.0000 mg | ORAL_TABLET | Freq: Once | ORAL | Status: AC
  Administered 2022-09-17: 1000 mg via ORAL
  Filled 2022-09-17: qty 2

## 2022-09-17 NOTE — Discharge Instructions (Addendum)
We evaluated you for your chest pain.  Your cardiac enzyme tests were negative.  Your x-ray did not show any dangerous problems in your chest.  Your EKG did not show any new findings.  Please take Tylenol for your pain.  You can take 650 mg of Tylenol every 6 hours as needed for your chest pain.  Please follow-up closely with your primary physician.  Return to the emergency department if you develop any new or worsening symptoms such as shortness of breath, severe pain, nausea or vomiting, or any other concerning symptoms.

## 2022-09-17 NOTE — ED Triage Notes (Signed)
Pt arrives to ED with his Granddaughter. She states patient has been experiencing chest pain and sob since this morning. Pt has dementia, he is at his reported baseline.

## 2022-09-17 NOTE — ED Provider Notes (Signed)
[place] EMERGENCY DEPARTMENT Provider Note  CSN: [id number] Arrival date & time: 09/17/22 1210  Chief Complaint(s) Chest Pain  HPI Jerry [name] is a 86 y.o. male with history of prostate cancer, chronic kidney disease, dementia, hyperlipidemia, hypertension presenting to the emergency department with chest pain.  Per daughter, patient was in his normal state of health, was having multiple bowel movements this morning, and complained of chest pain.  The patient denies any shortness of breath, nausea, vomiting, diaphoresis, lightheadedness or dizziness.  The granddaughter reports patient has not recently fallen or complained of any other symptoms.  His mental status seems at his baseline.   Past Medical History Past Medical History:  Diagnosis Date   Cancer of prostate ([place]) 2001   Chronic kidney disease (CKD), stage III (moderate) ([place])    /notes 02/08/2017   Dementia ([place])    DIVERTICULOSIS, COLON 07/25/2007   Qualifier: Diagnosis of  By: [name], LPN, [name]    High cholesterol    History of radiation therapy 05/01/17-05/09/17   right lung 54 Gy in 3 fractions   History of radiation therapy    prostate radioactive seed placement 07/2000  Dr Valere Dross   Hypertension    Lung cancer ([place]) 03/2017   Patient Active Problem List   Diagnosis Date Noted   Urinary incontinence 08/02/2022   Aortic atherosclerosis ([place]) 01/06/2018   History of lung cancer 09/18/2017   Pulmonary emphysema ([place])    Parotiditis 02/08/2017   Severe dementia ([place]) 07/17/2015   Hyperlipidemia 10/15/2014   Allergic rhinitis 10/15/2014   Overactive bladder 10/15/2014   CARDIAC MURMUR 01/10/2011   ORGANIC IMPOTENCE 08/12/2010   Gout of big toe 08/04/2008   Essential hypertension 07/25/2007   PROSTATE CANCER, HX OF 07/25/2007   Home Medication(s) Prior to Admission medications   Medication Sig Start Date End Date Taking? Authorizing Provider  amLODipine-olmesartan (AZOR) 5-40 MG  tablet TAKE ONE TABLET BY MOUTH EVERY DAY 07/13/22   Marin Olp, MD  diclofenac Sodium (VOLTAREN) 1 % GEL Apply 2 g topically 4 (four) times daily. 10/12/20   Marin Olp, MD  donepezil (ARICEPT) 10 MG tablet TAKE ONE TABLET BY MOUTH EVERY DAY 09/01/22   Marin Olp, MD  ferrous sulfate 325 (65 FE) MG tablet Take three times a week 11/16/21   Marin Olp, MD  Multiple Vitamin (MULTIVITAMIN) tablet Take 1 tablet by mouth daily. Centrum silver    [provider]  rosuvastatin (CRESTOR) 20 MG tablet TAKE ONE TABLET BY MOUTH EVERY DAY 09/01/22   Marin Olp, MD                                                                                                                                    Past Surgical History Past Surgical History:  Procedure Laterality Date   DIRECT LARYNGOSCOPY N/A 06/21/2017   Procedure: DIRECT LARYNGOSCOPY;  Surgeon: Melissa Montane, MD;  Location: [facility] OR;  Service: ENT;  Laterality: N/A;   EYE SURGERY     INSERTION PROSTATE RADIATION SEED  07/2000   [name] 05/09/2011   PENILE PROSTHESIS IMPLANT  02/2002   Insertion of Mentor three piece inflatable penile prosthesis./notes 05/09/2011   PENILE PROSTHESIS PLACEMENT  06/2003   Placement of Mentor rod prosthesis/notes 05/09/2011   REFRACTIVE SURGERY Bilateral    REMOVAL OF PENILE PROSTHESIS  06/2003   Removal of three-piece Mentor prosthesis/notes 05/09/2011   SCROTAL SURGERY  06/2003   Repair of scrotal defect/notes 05/09/2011   Family History Family History  Problem Relation Age of Onset   Early death Father        cirrhosis   Alcoholism Father    Cirrhosis Other    Breast cancer Sister     Social History Social History   Tobacco Use   Smoking status: Former    Packs/day: 0.50    Types: Cigarettes    Quit date: 12/26/1988    Years since quitting: 33.7   Smokeless tobacco: Never   Tobacco comments:    "I don't remember how long really" (02/08/2017)  Vaping Use   Vaping Use: Never  used  Substance Use Topics   Alcohol use: No   Drug use: No   Allergies Namenda [memantine]  Review of Systems Review of Systems  All other systems reviewed and are negative.   Physical Exam Vital Signs  I have reviewed the triage vital signs BP (!) 143/78   Pulse 76   Temp 98.9 F (37.2 C) (Oral)   Resp 18   Ht 5\' 7"  (1.702 m)   Wt 102.1 kg   SpO2 97%   BMI 35.24 kg/m  Physical Exam Vitals and nursing note reviewed.  Constitutional:      General: He is not in acute distress.    Appearance: Normal appearance.  HENT:     Mouth/Throat:     Mouth: Mucous membranes are moist.  Eyes:     Conjunctiva/sclera: Conjunctivae normal.  Cardiovascular:     Rate and Rhythm: Normal rate and regular rhythm.  Pulmonary:     Effort: Pulmonary effort is normal. No respiratory distress.     Breath sounds: Normal breath sounds.  Chest:     Chest wall: Tenderness (chest pain reproducible on palpation) present.  Abdominal:     General: Abdomen is flat.     Palpations: Abdomen is soft.     Tenderness: There is no abdominal tenderness.  Musculoskeletal:     Right lower leg: No edema.     Left lower leg: No edema.  Skin:    General: Skin is warm and dry.     Capillary Refill: Capillary refill takes less than 2 seconds.  Neurological:     General: No focal deficit present.     Mental Status: He is alert. Mental status is at baseline.  Psychiatric:        Mood and Affect: Mood normal.        Behavior: Behavior normal.     ED Results and Treatments Labs (all labs ordered are listed, but only abnormal results are displayed) Labs Reviewed  BASIC METABOLIC PANEL - Abnormal; Notable for the following components:      Result Value   Chloride 113 (*)    Creatinine, Ser 1.50 (*)    GFR, Estimated 46 (*)    All other components within normal limits  CBC - Abnormal; Notable for the following components:   RDW 15.9 (*)  All other components within normal limits  TROPONIN I (HIGH  SENSITIVITY)  TROPONIN I (HIGH SENSITIVITY)                                                                                                                          Radiology DG Chest 2 View  Result Date: 09/17/2022 CLINICAL DATA:  Chest pain and shortness of breath. EXAM: CHEST - 2 VIEW COMPARISON:  One-view chest x-ray 03/22/2017 FINDINGS: Heart size exaggerated by low lung volumes. No edema or effusion is present. No focal airspace disease is present. IMPRESSION: 1. Low lung volumes. 2. No acute cardiopulmonary disease. Electronically Signed   By: San Morelle M.D.   On: 09/17/2022 13:17    Pertinent labs & imaging results that were available during my care of the patient were reviewed by me and considered in my medical decision making (see MDM for details).  Medications Ordered in ED Medications  acetaminophen (TYLENOL) tablet 1,000 mg (1,000 mg Oral Given 09/17/22 1513)                                                                                                                                     Procedures Procedures  (including critical care time)  Medical Decision Making / ED Course   MDM:  86 year old male presenting to the emergency department chest pain  EKG appears unchanged from prior.  No findings concerning for acute ischemia.  History atypical, no associated symptoms, low concern for ACS, pain is also reproducible on palpation.  Suspect most likely musculoskeletal pain.  No symptoms or lung sounds to suggest a pneumonia and chest x-ray clear.  Chest x-ray without evidence of pneumothorax.  Doubt PE without tachycardia, hypoxia, recent travel or surgeries.  Doubt dissection, no radiation of the pain to the back, pain is not severe, pain comes and goes.  Pain did start relatively recently so we will likely check delta troponin.  Delta troponin negative and labs otherwise unremarkable, likely discharge.  Will treat pain with Tylenol.  Clinical Course as of  09/17/22 1531  Sat Sep 17, 2022  1350 Creatinine(!): 1.50 At baseline [WS]  1350 Troponin I (High Sensitivity): 10 normal [WS]  7654 Signed out pending repeat troponin.  [WS]    Clinical Course User Index [WS] Cristie Hem, MD     Additional history obtained: -Additional history obtained from family -External records from outside  source obtained and reviewed including: Chart review including previous notes, labs, imaging, consultation notes   Lab Tests: -I ordered, reviewed, and interpreted labs.   The pertinent results include:   Labs Reviewed  BASIC METABOLIC PANEL - Abnormal; Notable for the following components:      Result Value   Chloride 113 (*)    Creatinine, Ser 1.50 (*)    GFR, Estimated 46 (*)    All other components within normal limits  CBC - Abnormal; Notable for the following components:   RDW 15.9 (*)    All other components within normal limits  TROPONIN I (HIGH SENSITIVITY)  TROPONIN I (HIGH SENSITIVITY)      EKG   EKG Interpretation  Date/Time:  Saturday September 17 2022 13:23:36 EDT Ventricular Rate:  61 PR Interval:  164 QRS Duration: 143 QT Interval:  463 QTC Calculation: 467 R Axis:   268 Text Interpretation: Normal sinus rhythm Right bundle branch block Anterolateral infarct, age indeterminate No significant change since last tracing Confirmed by Garnette Gunner [phone]) on 09/17/2022 1:34:00 PM         Imaging Studies ordered: I ordered imaging studies including chest x-ray On my interpretation imaging demonstrates no acute process I independently visualized and interpreted imaging. I agree with the radiologist interpretation   Medicines ordered and prescription drug management: Meds ordered this encounter  Medications   acetaminophen (TYLENOL) tablet 1,000 mg    -I have reviewed the patients home medicines and have made adjustments as needed    Social Determinants of Health:  Factors impacting patients care  include: former smoker   Reevaluation: After the interventions noted above, I reevaluated the patient and found that they have improved  Co morbidities that complicate the patient evaluation  Past Medical History:  Diagnosis Date   Cancer of prostate ([place]) 2001   Chronic kidney disease (CKD), stage III (moderate) ([place])    [name] 02/08/2017   Dementia ([place])    DIVERTICULOSIS, COLON 07/25/2007   Qualifier: Diagnosis of  By: [name], LPN, [name]    High cholesterol    History of radiation therapy 05/01/17-05/09/17   right lung 54 Gy in 3 fractions   History of radiation therapy    prostate radioactive seed placement 07/2000  Dr Valere Dross   Hypertension    Lung cancer [facility]) 03/2017      Dispostion: Pending at sign out    Final Clinical Impression(s) / ED Diagnoses Final diagnoses:  Chest wall pain     This chart was dictated using voice recognition software.  Despite best efforts to proofread,  errors can occur which can change the documentation meaning.    Cristie Hem, MD 09/17/22 1531

## 2022-09-19 ENCOUNTER — Encounter: Payer: Self-pay | Admitting: *Deleted

## 2022-09-20 ENCOUNTER — Ambulatory Visit (INDEPENDENT_AMBULATORY_CARE_PROVIDER_SITE_OTHER): Payer: Medicare Other | Admitting: Family Medicine

## 2022-09-20 ENCOUNTER — Encounter: Payer: Self-pay | Admitting: Family Medicine

## 2022-09-20 VITALS — BP 128/68 | HR 63 | Temp 98.4°F | Ht 67.0 in | Wt 226.2 lb

## 2022-09-20 DIAGNOSIS — R0789 Other chest pain: Secondary | ICD-10-CM

## 2022-09-20 DIAGNOSIS — E785 Hyperlipidemia, unspecified: Secondary | ICD-10-CM

## 2022-09-20 DIAGNOSIS — I10 Essential (primary) hypertension: Secondary | ICD-10-CM | POA: Diagnosis not present

## 2022-09-20 DIAGNOSIS — Z23 Encounter for immunization: Secondary | ICD-10-CM

## 2022-09-20 DIAGNOSIS — F03C0 Unspecified dementia, severe, without behavioral disturbance, psychotic disturbance, mood disturbance, and anxiety: Secondary | ICD-10-CM

## 2022-09-20 NOTE — Progress Notes (Signed)
Phone [phone] In person visit   Subjective:   Jerry [name] is a 86 y.o. year old very pleasant male patient who presents for/with See problem oriented charting Chief Complaint  Patient presents with   Follow-up    Pt is here to f/u from ER visit for chest pain.     Past Medical History-  Patient Active Problem List   Diagnosis Date Noted   History of lung cancer 09/18/2017    Priority: High   Severe dementia ([place]) 07/17/2015    Priority: High   CKD (chronic kidney disease), stage III ([place]) 02/08/2017    Priority: Medium    Hyperlipidemia 10/15/2014    Priority: Medium    Gout of big toe 08/04/2008    Priority: Medium    Essential hypertension 07/25/2007    Priority: Medium    PROSTATE CANCER, HX OF 07/25/2007    Priority: Medium    Aortic atherosclerosis ([place]) 01/06/2018    Priority: Low   Pulmonary emphysema ([place])     Priority: Low   Parotiditis 02/08/2017    Priority: Low   Allergic rhinitis 10/15/2014    Priority: Low   Overactive bladder 10/15/2014    Priority: Low   CARDIAC MURMUR 01/10/2011    Priority: Low   ORGANIC IMPOTENCE 08/12/2010    Priority: Low   Urinary incontinence 08/02/2022    Medications- reviewed and updated Current Outpatient Medications  Medication Sig Dispense Refill   amLODipine-olmesartan (AZOR) 5-40 MG tablet TAKE ONE TABLET BY MOUTH EVERY DAY 90 tablet 0   diclofenac Sodium (VOLTAREN) 1 % GEL Apply 2 g topically 4 (four) times daily. 100 g 3   donepezil (ARICEPT) 10 MG tablet TAKE ONE TABLET BY MOUTH EVERY DAY 90 tablet PRN   ferrous sulfate 325 (65 FE) MG tablet Take three times a week 40 tablet 3   Multiple Vitamin (MULTIVITAMIN) tablet Take 1 tablet by mouth daily. Centrum silver     rosuvastatin (CRESTOR) 20 MG tablet TAKE ONE TABLET BY MOUTH EVERY DAY 90 tablet PRN   No current facility-administered medications for this visit.     Objective:  BP 128/68   Pulse 63   Temp 98.4 F (36.9 C)   Ht 5\' 7"  (1.702 m)    Wt 226 lb 3.2 oz (102.6 kg)   SpO2 98%   BMI 35.43 kg/m  Gen: NAD,  Patient friendly, makes several jokes but at other times takes nap during visit CV: RRR no murmurs rubs or gallops No chest wall tenderness today Lungs: CTAB no crackles, wheeze, rhonchi Ext: no edema Skin: warm, dry    Assessment and Plan   #social update-   #Chest wall pain S: Patient noted to emergency department on 09/17/2022-has been in his normal state of health but after having multiple bowel movements started complaining of chest pain with no associated shortness of breath, nausea, vomiting, diaphoresis, lightheadedness or dizziness or left arm or neck pain  EKG unchanged from prior with no concerns for acute ischemia.  Reproducible pain on exam thought to be musculoskeletal.  Chest x-ray was reassuring without pneumothorax or pneumonia.  CT scan also reassuring of the chest- no changes in prior lung cancer after treatment (will have follow up with Dr. Sondra Come in January) It was thought PE less likely without tachycardia, hypoxia or shortness of breath along with no recent potential triggers such as travel or surgeries.  Delta troponins were checked and low risk for cardiac disease.  He was discharged home for PCP  follow-up  No more pain since getting home from ED.  A/P: Thankfully chest wall pain has resolved and no recurrence of symptoms-does certainly sound musculoskeletal in origin-excellent work-up by emergency room team/physician-no further work-up at this time.   #Dementia S: Medication: Donepezil 10 mg -Did not tolerate Namenda due to dizziness -Daughter working with [facility] senior [facility] out of [place] to help with resources for her dad.  -granddaughter staying in the night and gets him off to senior center/adult day center -then daughter comes in at 4-8 pm daily and Saturday, Sunday (daughter gets more time at home now) -No recent worsening reported A/P: Overall stable-continue current  medications   #hypertension S: medication: Amlodipine-olmesartan 5-40 mg BP Readings from Last 3 Encounters:  09/20/22 128/68  09/17/22 128/67  08/02/22 112/60  A/P: Controlled. Continue current medications.   #CKD stage III-GFR typically in the 40s or 50s-discussed diagnosis today-continue risk factor modification  #hyperlipidemia #Aortic atherosclerosis S: Medication: Rosuvastatin 20 mg Lab Results  Component Value Date   CHOL 119 09/09/2021   HDL 31.90 (L) 09/09/2021   LDLCALC 81 10/12/2020   LDLDIRECT 58.0 09/09/2021   TRIG 223.0 (H) 09/09/2021   CHOLHDL 4 09/09/2021   A/P: last LDL looked really good under 70 for aortic atherosclerosis (presumed stable and also had some coronary atherosclerosis)- contiue current meds  every 6 months would be reasonable in general Future Appointments  Date Time Provider [place]  12/13/2022  9:00 AM [facility] CCM PHARMACIST [facility]  02/13/2023  3:00 PM Gery Pray, MD [facility] None    Lab/Order associations:   ICD-10-CM   1. Chest wall pain  R07.89     2. Essential hypertension  I10     3. Severe dementia without behavioral disturbance, psychotic disturbance, mood disturbance, or anxiety, unspecified dementia type ([place])  F03.C0     4. Hyperlipidemia, unspecified hyperlipidemia type  E78.5     5. Need for immunization against influenza  Z23 Flu Vaccine QUAD High Dose(Fluad)      No orders of the defined types were placed in this encounter.   Return precautions advised.  Garret Reddish, MD

## 2022-09-20 NOTE — Patient Instructions (Addendum)
No changes today. Glad pain has resolved. Follow up if recurrent issues. Hold off on labs today since just done  Recommended follow up: We did not discuss specific follow-up but every 6 months would be reasonable in general

## 2022-09-23 ENCOUNTER — Emergency Department (HOSPITAL_COMMUNITY)
Admission: EM | Admit: 2022-09-23 | Discharge: 2022-09-23 | Disposition: A | Discharge status: Home / Self Care | Payer: Medicare Other | Attending: Emergency Medicine | Admitting: Emergency Medicine

## 2022-09-23 ENCOUNTER — Other Ambulatory Visit: Payer: Self-pay

## 2022-09-23 ENCOUNTER — Encounter (HOSPITAL_COMMUNITY): Payer: Self-pay | Admitting: Emergency Medicine

## 2022-09-23 DIAGNOSIS — J00 Acute nasopharyngitis [common cold]: Secondary | ICD-10-CM | POA: Diagnosis not present

## 2022-09-23 DIAGNOSIS — R0989 Other specified symptoms and signs involving the circulatory and respiratory systems: Secondary | ICD-10-CM | POA: Diagnosis present

## 2022-09-23 NOTE — ED Provider Notes (Signed)
[place] EMERGENCY DEPARTMENT Provider Note   CSN: [id number] Arrival date & time: 09/23/22  1918     History  Chief Complaint  Patient presents with   vitals check    Jerry [name] is a 86 y.o. male who presents emergency department for evaluation of runny nose.  Patient's daughter brought him in from his memory care facility to which she goes during the day.  She was concerned because he had a runny nose.  She has noticed some increased confusion but thinks he might have a UTI.  She does not wish to stay and only wanted to have his vital signs drawn to make sure he did not have any hypoxia.  She plans on seeing the doctor tomorrow.  He has very mild confusion and is demented at baseline.  No fevers or chills.  No cough or shortness of breath.  HPI     Home Medications Prior to Admission medications   Medication Sig Start Date End Date Taking? Authorizing Provider  amLODipine-olmesartan (AZOR) 5-40 MG tablet TAKE ONE TABLET BY MOUTH EVERY DAY 07/13/22   Marin Olp, MD  diclofenac Sodium (VOLTAREN) 1 % GEL Apply 2 g topically 4 (four) times daily. 10/12/20   Marin Olp, MD  donepezil (ARICEPT) 10 MG tablet TAKE ONE TABLET BY MOUTH EVERY DAY 09/01/22   Marin Olp, MD  ferrous sulfate 325 (65 FE) MG tablet Take three times a week 11/16/21   Marin Olp, MD  Multiple Vitamin (MULTIVITAMIN) tablet Take 1 tablet by mouth daily. Centrum silver    [provider]  rosuvastatin (CRESTOR) 20 MG tablet TAKE ONE TABLET BY MOUTH EVERY DAY 09/01/22   Marin Olp, MD      Allergies    Namenda [memantine]    Review of Systems   Review of Systems  Physical Exam Updated Vital Signs BP (!) 146/80   Pulse 83   Temp 99.9 F (37.7 C) (Oral)   Resp 18   SpO2 98%  Physical Exam Vitals and nursing note reviewed.  Constitutional:      General: He is not in acute distress.    Appearance: He is well-developed. He is not diaphoretic.   HENT:     Head: Normocephalic and atraumatic.  Eyes:     General: No scleral icterus.    Conjunctiva/sclera: Conjunctivae normal.  Cardiovascular:     Rate and Rhythm: Normal rate and regular rhythm.     Heart sounds: Normal heart sounds.  Pulmonary:     Effort: Pulmonary effort is normal. No respiratory distress.     Breath sounds: Normal breath sounds.  Abdominal:     Palpations: Abdomen is soft.     Tenderness: There is no abdominal tenderness.  Musculoskeletal:     Cervical back: Normal range of motion and neck supple.  Skin:    General: Skin is warm and dry.  Neurological:     Mental Status: He is alert. Mental status is at baseline.  Psychiatric:        Behavior: Behavior normal.     ED Results / Procedures / Treatments   Labs (all labs ordered are listed, but only abnormal results are displayed) Labs Reviewed - No data to display  EKG None  Radiology No results found.  Procedures Procedures    Medications Ordered in ED Medications - No data to display  ED Course/ Medical Decision Making/ A&P  Medical Decision Making  Patient vital signs within normal limits.  She does not wish to stay to have a work-up for increased confusion with baseline dementia.  Plans on having him checked for UTI at his doctor's office tomorrow.  No fever shortness of breath hypoxia.  He has mild cold symptoms.  Follow-up in the outpatient setting is appropriate.  Discussed return precautions        Final Clinical Impression(s) / ED Diagnoses Final diagnoses:  Common cold    Rx / DC Orders ED Discharge Orders     None         Margarita Mail, PA-C 09/23/22 1940    Audley Hose, MD 09/24/22 1119

## 2022-09-23 NOTE — ED Triage Notes (Signed)
Pt BIB his daughter, who is concerned that pt has had a cold for the last few days. Daughter requesting vitals to be checked and states she will take him to his regular doctor tomorrow. Pt appears to be in no acute distress.

## 2022-09-23 NOTE — Discharge Instructions (Signed)
Get help right away for any new or worsening complaints

## 2022-09-24 ENCOUNTER — Encounter (HOSPITAL_COMMUNITY): Payer: Self-pay | Admitting: *Deleted

## 2022-09-24 ENCOUNTER — Other Ambulatory Visit: Payer: Self-pay

## 2022-09-24 ENCOUNTER — Ambulatory Visit (HOSPITAL_COMMUNITY)
Admission: EM | Admit: 2022-09-24 | Discharge: 2022-09-24 | Disposition: A | Discharge status: Home / Self Care | Payer: Medicare Other | Attending: Emergency Medicine | Admitting: Emergency Medicine

## 2022-09-24 DIAGNOSIS — Z87891 Personal history of nicotine dependence: Secondary | ICD-10-CM | POA: Diagnosis not present

## 2022-09-24 DIAGNOSIS — U071 COVID-19: Secondary | ICD-10-CM | POA: Diagnosis not present

## 2022-09-24 DIAGNOSIS — R410 Disorientation, unspecified: Secondary | ICD-10-CM

## 2022-09-24 LAB — POCT URINALYSIS DIPSTICK, ED / UC
Bilirubin Urine: NEGATIVE
Glucose, UA: NEGATIVE mg/dL
Ketones, ur: NEGATIVE mg/dL
Leukocytes,Ua: NEGATIVE
Nitrite: NEGATIVE
Protein, ur: 30 mg/dL — AB
Specific Gravity, Urine: 1.025 (ref 1.005–1.030)
Urobilinogen, UA: 0.2 mg/dL (ref 0.0–1.0)
pH: 5 (ref 5.0–8.0)

## 2022-09-24 LAB — SARS CORONAVIRUS 2 (TAT 6-24 HRS): SARS Coronavirus 2: POSITIVE — AB

## 2022-09-24 NOTE — ED Triage Notes (Signed)
Pt has had in crease confusion since Tuesday. Pt sent to [facility] with Daughter to get checked for UTI. Pt goes to Adult Day Care where some one tested positive for COVID. Pt has a runny nose . Family wants a COVID test and Urine check.

## 2022-09-24 NOTE — Discharge Instructions (Signed)
At this time unknown cause for increased mental status  Urinalysis is negative for infection  COVID test is pending up to 24 hours, you will be notified of positive test results only, if positive based on age will be treated with antivirals which ideally will minimize severity and timeline of illness but does not fully take it away, as COVID is a virus and will resolve with time and you may use over-the-counter medicines as needed to treat the symptoms, if positive will need to quarantine for 5 days from the onset of his runny nose  If increased confusion continues to persist please schedule an appointment with your primary doctor for further evaluation

## 2022-09-24 NOTE — ED Provider Notes (Signed)
[facility]    CSN: [id number] Arrival date & time: 09/24/22  1019      History   Chief Complaint Chief Complaint  Patient presents with   Altered Mental Status    HPI Jerry [name] is a 86 y.o. male.   Patient presents for evaluation of increased confusion for 4 days.  During this timeframe has also experienced runny nose and a subjective fever.  Attends adult daycare from approximately 9-4, known exposure to [place].  Denies urinary frequency, urgency that is changed from baseline, dysuria, hematuria, abdominal or back pain, fever.  Denies cough, shortness of breath, wheezing, ear pain, sore throat.   All history obtained from family member    Past Medical History:  Diagnosis Date   Cancer of prostate ([place]) 2001   Chronic kidney disease (CKD), stage III (moderate) ([place])    [name] 02/08/2017   Dementia ([place])    DIVERTICULOSIS, COLON 07/25/2007   Qualifier: Diagnosis of  By: [name], LPN, [name]    High cholesterol    History of radiation therapy 05/01/17-05/09/17   right lung 54 Gy in 3 fractions   History of radiation therapy    prostate radioactive seed placement 07/2000  Dr Valere Dross   Hypertension    Lung cancer ([place]) 03/2017    Patient Active Problem List   Diagnosis Date Noted   Urinary incontinence 08/02/2022   Aortic atherosclerosis ([place]) 01/06/2018   History of lung cancer 09/18/2017   Pulmonary emphysema ([place])    Parotiditis 02/08/2017   CKD (chronic kidney disease), stage III ([place]) 02/08/2017   Severe dementia ([place]) 07/17/2015   Hyperlipidemia 10/15/2014   Allergic rhinitis 10/15/2014   Overactive bladder 10/15/2014   CARDIAC MURMUR 01/10/2011   ORGANIC IMPOTENCE 08/12/2010   Gout of big toe 08/04/2008   Essential hypertension 07/25/2007   PROSTATE CANCER, HX OF 07/25/2007    Past Surgical History:  Procedure Laterality Date   DIRECT LARYNGOSCOPY N/A 06/21/2017   Procedure: DIRECT LARYNGOSCOPY;  Surgeon: Melissa Montane, MD;  Location: [facility];  Service: ENT;  Laterality: N/A;   [place]  07/2000   [name] 05/09/2011   PENILE PROSTHESIS IMPLANT  02/2002   Insertion of Mentor three piece inflatable penile prosthesis./notes 05/09/2011   PENILE PROSTHESIS PLACEMENT  06/2003   Placement of Mentor rod prosthesis/notes 05/09/2011   REFRACTIVE SURGERY Bilateral    REMOVAL OF PENILE PROSTHESIS  06/2003   Removal of three-piece Mentor prosthesis/notes 05/09/2011   SCROTAL SURGERY  06/2003   Repair of scrotal defect/notes 05/09/2011       Home Medications    Prior to Admission medications   Medication Sig Start Date End Date Taking? Authorizing Provider  amLODipine-olmesartan (AZOR) 5-40 MG tablet TAKE ONE TABLET BY MOUTH EVERY DAY 07/13/22   Marin Olp, MD  diclofenac Sodium (VOLTAREN) 1 % GEL Apply 2 g topically 4 (four) times daily. 10/12/20   Marin Olp, MD  donepezil (ARICEPT) 10 MG tablet TAKE ONE TABLET BY MOUTH EVERY DAY 09/01/22   Marin Olp, MD  ferrous sulfate 325 (65 FE) MG tablet Take three times a week 11/16/21   Marin Olp, MD  Multiple Vitamin (MULTIVITAMIN) tablet Take 1 tablet by mouth daily. Centrum silver    [provider]  rosuvastatin (CRESTOR) 20 MG tablet TAKE ONE TABLET BY MOUTH EVERY DAY 09/01/22   Marin Olp, MD    Family History Family History  Problem Relation  Age of Onset   Early death Father        cirrhosis   Alcoholism Father    Cirrhosis Other    Breast cancer Sister     Social History Social History   Tobacco Use   Smoking status: Former    Packs/day: 0.50    Types: Cigarettes    Quit date: 12/26/1988    Years since quitting: 33.7   Smokeless tobacco: Never   Tobacco comments:    "I don't remember how long really" (02/08/2017)  Vaping Use   Vaping Use: Never used  Substance Use Topics   Alcohol use: No   Drug use: No     Allergies   Namenda [memantine]   Review of Systems Review of  Systems Defer to HPI    Physical Exam Triage Vital Signs ED Triage Vitals [09/24/22 1126]  Enc Vitals Group     BP 99/65     Pulse Rate 99     Resp 18     Temp 98.9 F (37.2 C)     Temp src      SpO2 99 %     Weight      Height      Head Circumference      Peak Flow      Pain Score      Pain Loc      Pain Edu?      Excl. in [place]?    No data found.  Updated Vital Signs BP 99/65   Pulse 99   Temp 98.9 F (37.2 C)   Resp 18   SpO2 99%   Visual Acuity Right Eye Distance:   Left Eye Distance:   Bilateral Distance:    Right Eye Near:   Left Eye Near:    Bilateral Near:     Physical Exam Constitutional:      Appearance: Normal appearance.  HENT:     Head: Normocephalic.     Right Ear: Tympanic membrane, ear canal and external ear normal.     Left Ear: Tympanic membrane, ear canal and external ear normal.     Nose: Congestion present.     Mouth/Throat:     Mouth: Mucous membranes are moist.     Pharynx: Oropharynx is clear.  Eyes:     Extraocular Movements: Extraocular movements intact.  Cardiovascular:     Rate and Rhythm: Normal rate and regular rhythm.     Pulses: Normal pulses.     Heart sounds: Normal heart sounds.  Pulmonary:     Effort: Pulmonary effort is normal.     Breath sounds: Normal breath sounds.  Skin:    General: Skin is warm and dry.  Neurological:     Mental Status: He is alert and oriented to person, place, and time. Mental status is at baseline.  Psychiatric:        Mood and Affect: Mood normal.        Behavior: Behavior normal.      UC Treatments / Results  Labs (all labs ordered are listed, but only abnormal results are displayed) Labs Reviewed - No data to display  EKG   Radiology No results found.  Procedures Procedures (including critical care time)  Medications Ordered in UC Medications - No data to display  Initial Impression / Assessment and Plan / UC Course  I have reviewed the triage vital signs and the  nursing notes.  Pertinent labs & imaging results that were available during my care of  the patient were reviewed by me and considered in my medical decision making (see chart for details).  Confusion  Vital signs are stable and patient is in no signs of distress nor toxic appearing, no abnormalities noted on exam, mild congestion to the nasal turbinate, urinalysis is negative for infection, discussed findings with family, COVID test is pending, if positive qualifies for antiviral, discussed medication use and quarantine guidelines, advised Coricidin for management of cold and flu symptoms due to history of hypertension, advised follow-up with PCP in 1 week if increased confusion continues to persist Final Clinical Impressions(s) / UC Diagnoses   Final diagnoses:  None   Discharge Instructions   None    ED Prescriptions   None    PDMP not reviewed this encounter.   Hans Eden, NP 09/24/22 [phone]

## 2022-09-26 ENCOUNTER — Telehealth: Payer: Self-pay | Admitting: Family Medicine

## 2022-09-26 NOTE — Telephone Encounter (Signed)
Pt was seen on [facility]  Patient Name: Jerry [name] Gender: Male DOB: 09/14/1936 Age: 86 Y 2 M 13 D Return Phone Number: [phone] (Primary) Address: City/ State/ Zip: [place]  [address] Client [facility] [place] Client Site [facility] [facility] [place] Night Provider Garret Reddish- MD Contact Type Call Who Is Calling Patient / Member / Family / Caregiver Call Type Triage / Clinical Caller Name Ellard Nan Relationship To Patient Daughter Return Phone Number [phone] (Primary) Chief Complaint CONFUSION - new onset Reason for Call Symptomatic / Request for [place] states father, eyes has been watering and he is warm. His nose is running. He went to ER last night. Reportedly he has confusion. The nurse suggested he goes back to check for a UTI. Has a hx of dementia. Translation No Nurse Assessment Nurse: Roselie Awkward, RN, Heather Date/Time (Eastern Time): 09/24/2022 8:15:46 AM Confirm and document reason for call. If symptomatic, describe symptoms. ---caller states dad felt warm and had a runny nose yesterday. Eyes were also watering. Had some confusion last night. Went to the ER last night but only had his vitals checked. Also seen in ER on 10/23 for chest pain. Received a flu shot on Tuesday. Has some cough and congestion this morning Does the patient have any new or worsening symptoms? ---Yes Will a triage be completed? ---Yes Related visit to physician within the last 2 weeks? ---Yes Does the PT have any chronic conditions? (i.e. diabetes, asthma, this includes High risk factors for pregnancy, etc.) ---Yes List chronic conditions. ---HTN, Is this a behavioral health or substance abuse call? ---No Guidelines Guideline Title Affirmed Question Affirmed Notes Nurse Date/Time ([place] Time) COVID-19 - Diagnosed or Suspected MILD difficulty breathing (e.g., minimal/no  SOB at rest, SOB with walking, pulse <100) Roselie Awkward, RN, Heather 09/24/2022 8:28:52 AM Disp. Time Eilene Ghazi Time) Disposition Final User 09/24/2022 8:12:15 AM Send to Urgent Queue [place] 09/24/2022 8:42:25 AM See HCP within 4 Hours (or PCP triage) Yes Roselie Awkward, RN, Heather Final Disposition 09/24/2022 8:42:25 AM See HCP within 4 Hours (or PCP triage) Yes Roselie Awkward, RN, Soyla Murphy Disagree/Comply Comply Caller Understands Yes PreDisposition Call Doctor Care Advice Given Per Guideline SEE HCP (OR PCP TRIAGE) WITHIN 4 HOURS: * IF OFFICE WILL BE OPEN: You need to be seen within the next 3 or 4 hours. Call your doctor (or NP/PA) now or as soon as the office opens. CALL BACK IF: * You become worse CARE ADVICE given per COVID-19 - DIAGNOSED OR SUSPECTED (Adult) guideline. * UCC: Some UCCs can manage patients who are stable and have less serious symptoms (e.g., minor illnesses and injuries). The triager must know the [facility] capabilities before sending a patient there. If unsure, call ahead. Comments User: Mauri Pole, RN Date/Time Eilene Ghazi Time): 09/24/2022 8:47:46 AM caller denies confusion this morning. States father was responding appropriately and was able to walk to the bathroom Referrals [place] UNDECIDED

## 2022-09-26 NOTE — Telephone Encounter (Signed)
Patient Name: Jerry [name] Gender: Male DOB: 05/19/1936 Age: 86 Y 2 M 12 D Return Phone Number: [phone] (Primary), [phone] (Secondary) Address: City/ State/ Zip: [place]  [address] Client [facility] [place] Client Site [place] [facility] [place] Night Provider Garret Reddish- MD Contact Type Call Who Is Calling Patient / Member / Family / Caregiver Call Type Triage / Clinical Caller Name Rayvion Stumph Relationship To Patient Daughter Return Phone Number [phone] (Primary) Chief Complaint Runny or [place] Reason for Call Symptomatic / Request for [place] states her fathers nose is running and his eyes are watery. He does feel warm to her. Translation No Disp. Time Eilene Ghazi Time) Disposition Final User 09/23/2022 6:00:48 PM Attempt made - no message left Junius Creamer, RN, Hilda Blades 09/23/2022 6:01:58 PM Attempt made - message left Junius Creamer, RN, Hilda Blades 09/23/2022 6:13:08 PM FINAL ATTEMPT MADE - no message left Yes Junius Creamer RN, Hilda Blades Final Disposition 09/23/2022 6:13:08 PM FINAL ATTEMPT MADE - no message left Yes Junius Creamer, RN, Hilda Blades Comments User: Sandi Carne, RN Date/Time Eilene Ghazi Time): 09/23/2022 6:01:01 PM Mailbox full. No message

## 2022-09-26 NOTE — Telephone Encounter (Signed)
Pt was seen in [facility] on 09/23/22   Patient Name: Jerry [name] Gender: Male DOB: 01/02/1936 Age: 86 Y 2 M 13 D Return Phone Number: [phone] (Primary), [phone] (Secondary), [phone] (Alternate) Address: City/ State/ Zip: [place]  [address] Client [facility] [place] Client Site [place] [facility] [place] Night Provider Garret Reddish- MD Contact Type Call Who Is Calling Patient / Member / Family / Caregiver Call Type Triage / Clinical Caller Name Owain Eckerman Relationship To Patient Daughter Return Phone Number [phone] (Primary) Chief Complaint CONFUSION - new onset Reason for Call Symptomatic / Request for [place] states her dad has a runny nose and watery eyes. She would like to know if she can give him Benadryl with his three current medications. He is also experiencing some confusion. Translation No Nurse Assessment Nurse: Surprenant, RN, Monique Date/Time (Eastern Time): 09/23/2022 10:37:01 PM Confirm and document reason for call. If symptomatic, describe symptoms. ---Caller states her dad has a runny nose and watery eyes. No temp. No coughing. Sxs started this evening. Had some confusion earlier today but seems ok right now. Was just at the drs office 3 days ago. Did have a flu shot 3 days ago. Was in ER over last weekend d/t chest discomfort. Had a lot of blood work and check up Does the patient have any new or worsening symptoms? ---Yes Will a triage be completed? ---Yes Related visit to physician within the last 2 weeks? ---Yes Does the PT have any chronic conditions? (i.e. diabetes, asthma, this includes High risk factors for pregnancy, etc.) ---Yes List chronic conditions. ---HTN - takes medication High cholesterol Is this a behavioral health or substance abuse call? ---No Guidelines Guideline Title Affirmed Question Affirmed Notes Nurse Date/Time  (Eastern Time) Common Cold Common cold with no complications Surprenant, RN, Monique 09/23/2022 10:44:09 PM Confusion - Delirium [1] Acting confused (e.g., disoriented, slurred speech) AND [2] brief (now gone) Surprenant, Therapist, sports, [facility] 09/23/2022 10:49:51 PM Disp. Time Eilene Ghazi Time) Disposition Final User 09/23/2022 10:35:54 PM Send to Urgent Queue [place] P[place] 09/23/2022 10:49:03 PM [place], RN, [facility] 09/23/2022 10:57:57 PM See HCP within 4 Hours (or PCP triage) Yes Surprenant, RN, Monique Final Disposition 09/23/2022 10:57:57 PM See HCP within 4 Hours (or PCP triage) Yes Surprenant, RN, Beckie Busing Caller Disagree/Comply Comply Caller Understands Yes PreDisposition Did not know what to do Care Advice Given Per Guideline HOME CARE: * You should be able to treat this at home. * Blowing your nose helps clean out your nose. Use a handkerchief or a paper tissue. * Nasal mucus and discharge help wash viruses and bacteria out of the nose and sinuses. EXPECTED COURSE: * Fever usually lasts 2 to 3 days. * Runny nose usually lasts 7 to 10 days. * Cough may last 2 to 3 weeks. CALL BACK IF: * Fever lasts over 3 days * Runny nose lasts over 10 days * You become short of breath * You become worse * Hydrate: Drink adequate liquids. * IF OFFICE WILL BE OPEN: You need to be seen within the next 3 or 4 hours. Call your doctor (or NP/PA) now or as soon as the office opens. CALL BACK IF: * You become worse * IF OFFICE WILL BE CLOSED AND NO PCP (PRIMARY CARE PROVIDER) SECOND-LEVEL TRIAGE: You need to be seen within the next 3 or 4 hours. A nearby Urgent Care Center [facility]) is often a good source of care. Another choice  is to go to the ED. Go sooner if you become worse. Referrals [place] - ED

## 2022-09-27 ENCOUNTER — Other Ambulatory Visit: Payer: Self-pay | Admitting: Family Medicine

## 2022-09-27 ENCOUNTER — Telehealth (INDEPENDENT_AMBULATORY_CARE_PROVIDER_SITE_OTHER): Payer: Medicare Other | Admitting: Family Medicine

## 2022-09-27 VITALS — Ht 70.0 in | Wt 226.0 lb

## 2022-09-27 DIAGNOSIS — U071 COVID-19: Secondary | ICD-10-CM | POA: Diagnosis not present

## 2022-09-27 MED ORDER — NIRMATRELVIR/RITONAVIR (PAXLOVID) TABLET (RENAL DOSING): 2.0000 | ORAL_TABLET | Freq: Two times a day (BID) | ORAL | 0 refills | Status: AC

## 2022-09-27 NOTE — Progress Notes (Signed)
Virtual Visit via Video Note  I connected with Jerry [name]  on 09/27/22 at 12:00 PM EDT by a video enabled telemedicine application and verified that I am speaking with the correct person using two identifiers.  Location patient: [place] Location provider:work or home office Persons participating in the virtual visit: patient, provider  I discussed the limitations and requested verbal permission for telemedicine visit. The patient expressed understanding and agreed to proceed.   HPI:  Acute telemedicine visit for Covid19: -Onset: 4 days ago,caregiver reports tested positive for covid 2 days ago at urgent care - had exposure a few days before -Symptoms include: nasal congestion, cough, initially felt tired and had confusion (this has resolved) -Denies: CP, SOB, NVD, continued confusion -Pertinent past medical history: see below, GFR 46  -Pertinent medication allergies: Allergies  Allergen Reactions   Namenda [Memantine] Other (See Comments)    Made pt feel dizzy   -COVID-19 vaccine status:  Immunization History  Administered Date(s) Administered   Fluad Quad(high Dose 65+) 10/12/2020, 09/09/2021, 09/20/2022   Influenza Whole 11/06/2007, 10/07/2008, 12/09/2010   Influenza, High Dose Seasonal PF 08/22/2016, 11/07/2018   Influenza,inj,Quad PF,6+ Mos 10/15/2014   PFIZER(Purple Top)SARS-COV-2 Vaccination 05/07/2020, 05/27/2020   Pneumococcal Conjugate-13 05/08/2015   Pneumococcal Polysaccharide-23 11/06/2007   Td 12/26/2006, 05/03/2018     ROS: See pertinent positives and negatives per HPI.  Past Medical History:  Diagnosis Date   Cancer of prostate ([place]) 2001   Chronic kidney disease (CKD), stage III (moderate) ([place])    /notes 02/08/2017   Dementia ([place])    DIVERTICULOSIS, COLON 07/25/2007   Qualifier: Diagnosis of  By: [name], LPN, [name]    High cholesterol    History of radiation therapy 05/01/17-05/09/17   right lung 54 Gy in 3 fractions   History of radiation therapy     prostate radioactive seed placement 07/2000  Dr Valere Dross   Hypertension    Lung cancer ([place]) 03/2017    Past Surgical History:  Procedure Laterality Date   DIRECT LARYNGOSCOPY N/A 06/21/2017   Procedure: DIRECT LARYNGOSCOPY;  Surgeon: Melissa Montane, MD;  Location: [facility];  Service: ENT;  Laterality: N/A;   EYE SURGERY     INSERTION PROSTATE RADIATION SEED  07/2000   [name] 05/09/2011   PENILE PROSTHESIS IMPLANT  02/2002   Insertion of Mentor three piece inflatable penile prosthesis./notes 05/09/2011   PENILE PROSTHESIS PLACEMENT  06/2003   Placement of Mentor rod prosthesis/notes 05/09/2011   REFRACTIVE SURGERY Bilateral    REMOVAL OF PENILE PROSTHESIS  06/2003   Removal of three-piece Mentor prosthesis/notes 05/09/2011   SCROTAL SURGERY  06/2003   Repair of scrotal defect/notes 05/09/2011     Current Outpatient Medications:    amLODipine-olmesartan (AZOR) 5-40 MG tablet, TAKE ONE TABLET BY MOUTH EVERY DAY, Disp: 90 tablet, Rfl: 0   donepezil (ARICEPT) 10 MG tablet, TAKE ONE TABLET BY MOUTH EVERY DAY, Disp: 90 tablet, Rfl: PRN   ferrous sulfate 325 (65 FE) MG tablet, Take three times a week, Disp: 40 tablet, Rfl: 3   Multiple Vitamin (MULTIVITAMIN) tablet, Take 1 tablet by mouth daily. Centrum silver, Disp: , Rfl:    nirmatrelvir/ritonavir EUA, renal dosing, (PAXLOVID) 10 x 150 MG & 10 x 100MG  TABS, Take 2 tablets by mouth 2 (two) times daily for 5 days. (Take nirmatrelvir 150 mg one tablet twice daily for 5 days and ritonavir 100 mg one tablet twice daily for 5 days) Patient GFR is 46, Disp: 20 tablet, Rfl: 0   rosuvastatin (CRESTOR) 20  MG tablet, TAKE ONE TABLET BY MOUTH EVERY DAY, Disp: 90 tablet, Rfl: PRN   diclofenac Sodium (VOLTAREN) 1 % GEL, Apply 2 g topically 4 (four) times daily. (Patient not taking: Reported on 09/27/2022), Disp: 100 g, Rfl: 3  EXAM:  VITALS per patient if applicable:  GENERAL: alert, oriented, appears well and in no acute distress  HEENT: atraumatic, conjunttiva  clear, no obvious abnormalities on inspection of external nose and ears  NECK: normal movements of the head and neck  LUNGS: on inspection no signs of respiratory distress, breathing rate appears normal, no obvious gross SOB, gasping or wheezing  CV: no obvious cyanosis  MS: moves all visible extremities without noticeable abnormality  PSYCH/NEURO: pleasant and cooperative, no obvious depression or anxiety, speech and thought processing grossly intact  ASSESSMENT AND PLAN:  Discussed the following assessment and plan:  COVID-19   Discussed treatment options, side effect and risk of drug interactions, ideal treatment window, potential complications, isolation and precautions for COVID-19.  Discussed possibility of rebound with or without antivirals. Checked for/reviewed last GFR - listed in HPI if available. After lengthy discussion, the patient opted for treatment with Paxlovid due to being higher risk for complications of covid or severe disease and other factors. Discussed EUA status of this drug and the fact that there is preliminary limited knowledge of risks/interactions/side effects per EUA document vs possible benefits and precautions. Advised patient and caregiver of need to hold statin. Restart 3 days after finishing course and potential interaction with BP med. This information was shared with patient and care giver during the visit and also was provided in patient instructions. Also, advised that patient discuss risks/interactions and use with pharmacist/treatment team as well. Other symptomatic care measures summarized in patient instructions.Also let them know if cant get medication at their pharmacy, that the [facility] could assist or to call back today before 4pm - let them know I do not work Architectural technologist.  Advised to seek prompt virtual visit or in person care if worsening, new symptoms arise, or if is not improving with treatment as expected per our conversation of expected  course. Discussed options for follow up care. Did let this patient know that I do telemedicine on Tuesdays and Thursdays for [place] and those are the days I am logged into the system. Advised to schedule follow up visit with PCP, [place] virtual visits or UCC if any further questions or concerns to avoid delays in care.   I discussed the assessment and treatment plan with the patient. The patient was provided an opportunity to ask questions and all were answered. The patient agreed with the plan and demonstrated an understanding of the instructions.     Lucretia Kern, DO

## 2022-09-27 NOTE — Patient Instructions (Addendum)
HOME CARE TIPS:  -COVID19 testing information: [email or web address]  Most pharmacies also offer testing and home test kits. If the Covid19 test is positive and you desire antiviral treatment, please contact a [facility] or schedule a follow up virtual visit through your primary care office or through the [facility].  Other test to treat options: ConnectRV.is?[email or web address]  -I sent the medication(s) we discussed to your pharmacy: Meds ordered this encounter  Medications   nirmatrelvir/ritonavir EUA, renal dosing, (PAXLOVID) 10 x 150 MG & 10 x 100MG  TABS    Sig: Take 2 tablets by mouth 2 (two) times daily for 5 days. (Take nirmatrelvir 150 mg one tablet twice daily for 5 days and ritonavir 100 mg one tablet twice daily for 5 days) Patient GFR is 46    Dispense:  20 tablet    Refill:  0     -I sent in the [facility] treatment or referral you requested per our discussion. Please see the information provided below and discuss further with the pharmacist/treatment team.  -If taking Paxlovid, please review all medications, supplement and over the counter drugs with your pharmacist and ask them to check for any interactions. Please make the following changes to your regular medications while taking Paxlovid: *HOLD your Crestor (rosuvastatin) while taking Paxlovid, can restart 3 days after finishing treatment *possible interaction with blood pressure medication, monitor and contact doctor if any concerns for low blood pressure  -there is a chance of rebound illness with covid after improving. This can happen whether or not you take an antiviral treatment. If you become sick again with covid after getting better, please schedule a follow up virtual visit and isolate again.  -nasal saline sinus rinses twice daily  -stay hydrated, drink plenty of fluids and eat small healthy meals - avoid dairy  -follow up with your doctor in 2-3 days  unless improving and feeling better  -stay home while sick, except to seek medical care. If you have COVID19, you will likely be contagious for 7-10 days. Flu or Influenza is likely contagious for about 7 days. Other respiratory viral infections remain contagious for 5-10+ days depending on the virus and many other factors. Wear a good mask that fits snugly (such as N95 or KN95) if around others to reduce the risk of transmission.  It was nice to meet you today, and I really hope you are feeling better soon. I help [facility] out with telemedicine visits on Tuesdays and Thursdays and am happy to help if you need a follow up virtual visit on those days. Otherwise, if you have any concerns or questions following this visit please schedule a follow up visit with your Primary Care doctor or seek care at a local urgent care clinic to avoid delays in care.    Seek in person care or schedule a follow up video visit promptly if your symptoms worsen, new concerns arise or you are not improving with treatment. Call 911 and/or seek emergency care if your symptoms are severe or life threatening.   See the following link for the most recent information regarding Paxlovid:  www.paxlovid.com   Nirmatrelvir; Ritonavir Tablets What is this medication? NIRMATRELVIR; RITONAVIR (NIR ma TREL vir; ri TOE na veer) treats mild to moderate COVID-19. It may help people who are at high risk of developing severe illness. This medication works by limiting the spread of the virus in your body. The FDA has allowed the emergency use of this medication. This medicine may be used for other purposes; ask  your health care provider or pharmacist if you have questions. COMMON BRAND NAME(S): PAXLOVID What should I tell my care team before I take this medication? They need to know if you have any of these conditions: Any allergies Any serious illness Kidney disease Liver disease An unusual or allergic reaction to nirmatrelvir,  ritonavir, other medications, foods, dyes, or preservatives Pregnant or trying to get pregnant Breast-feeding How should I use this medication? This product contains 2 different medications that are packaged together. For the standard dose, take 2 pink tablets of nirmatrelvir with 1 white tablet of ritonavir (3 tablets total) by mouth with water twice daily. Talk to your care team if you have kidney disease. You may need a different dose. Swallow the tablets whole. You can take it with or without food. If it upsets your stomach, take it with food. Take all of this medication unless your care team tells you to stop it early. Keep taking it even if you think you are better. Talk to your care team about the use of this medication in children. While it may be prescribed for children as young as 12 years for selected conditions, precautions do apply. Overdosage: If you think you have taken too much of this medicine contact a poison control center or emergency room at once. NOTE: This medicine is only for you. Do not share this medicine with others. What if I miss a dose? If you miss a dose, take it as soon as you can unless it is more than 8 hours late. If it is more than 8 hours late, skip the missed dose. Take the next dose at the normal time. Do not take extra or 2 doses at the same time to make up for the missed dose. What may interact with this medication? Do not take this medication with any of the following medications: Alfuzosin Certain medications for anxiety or sleep like midazolam, triazolam Certain medications for cancer like apalutamide, enzalutamide Certain medications for cholesterol like lovastatin, simvastatin Certain medications for irregular heart beat like amiodarone, dronedarone, flecainide, propafenone, quinidine Certain medications for pain like meperidine, piroxicam Certain medications for psychotic disorders like clozapine, lurasidone, pimozide Certain medications for seizures  like carbamazepine, phenobarbital, phenytoin Colchicine Eletriptan Eplerenone Ergot alkaloids like dihydroergotamine, ergonovine, ergotamine, methylergonovine Finerenone Flibanserin Ivabradine Lomitapide Naloxegol Ranolazine Rifampin Sildenafil Silodosin St. John's Wort Tolvaptan Ubrogepant Voclosporin This medication may also interact with the following medications: Bedaquiline Birth control pills Bosentan Certain antibiotics like erythromycin or clarithromycin Certain medications for blood pressure like amlodipine, diltiazem, felodipine, nicardipine, nifedipine Certain medications for cancer like abemaciclib, ceritinib, dasatinib, encorafenib, ibrutinib, ivosidenib, neratinib, nilotinib, venetoclax, vinblastine, vincristine Certain medications for cholesterol like atorvastatin, rosuvastatin Certain medications for depression like bupropion, trazodone Certain medications for fungal infections like isavuconazonium, itraconazole, ketoconazole, voriconazole Certain medications for hepatitis C like elbasvir; grazoprevir, dasabuvir; ombitasvir; paritaprevir; ritonavir, glecaprevir; pibrentasvir, sofosbuvir; velpatasvir; voxilaprevir Certain medications for HIV or AIDS Certain medications for irregular heartbeat like lidocaine Certain medications that treat or prevent blood clots like rivaroxaban, warfarin Digoxin Fentanyl Medications that lower your chance of fighting infection like cyclosporine, sirolimus, tacrolimus Methadone Quetiapine Rifabutin Salmeterol Steroid medications like betamethasone, budesonide, ciclesonide, dexamethasone, fluticasone, methylprednisone, mometasone, triamcinolone This list may not describe all possible interactions. Give your health care provider a list of all the medicines, herbs, non-prescription drugs, or dietary supplements you use. Also tell them if you smoke, drink alcohol, or use illegal drugs. Some items may interact with your medicine. What  should I watch for while using  this medication? Your condition will be monitored carefully while you are receiving this medication. Visit your care team for regular checkups. Tell your care team if your symptoms do not start to get better or if they get worse. If you have untreated HIV infection, this medication may lead to some HIV medications not working as well in the future. Birth control may not work properly while you are taking this medication. Talk to your care team about using an extra method of birth control. What side effects may I notice from receiving this medication? Side effects that you should report to your care team as soon as possible: Allergic reactions--skin rash, itching, hives, swelling of the face, lips, tongue, or throat Liver injury--right upper belly pain, loss of appetite, nausea, light-colored stool, dark yellow or brown urine, yellowing skin or eyes, unusual weakness or fatigue Redness, blistering, peeling, or loosening of the skin, including inside the mouth Side effects that usually do not require medical attention (report these to your care team if they continue or are bothersome): Change in taste Diarrhea General discomfort and fatigue Increase in blood pressure Muscle pain Nausea Stomach pain This list may not describe all possible side effects. Call your doctor for medical advice about side effects. You may report side effects to FDA at 1-800-FDA-1088. Where should I keep my medication? Keep out of the reach of children and pets. Store at room temperature between 20 and 25 degrees C (68 and 77 degrees F). Get rid of any unused medication after the expiration date. To get rid of medications that are no longer needed or have expired: Take the medication to a medication take-back program. Check with your pharmacy or law enforcement to find a location. If you cannot return the medication, check the label or package insert to see if the medication should be  thrown out in the garbage or flushed down the toilet. If you are not sure, ask your care team. If it is safe to put it in the trash, take the medication out of the container. Mix the medication with cat litter, dirt, coffee grounds, or other unwanted substance. Seal the mixture in a bag or container. Put it in the trash. NOTE: This sheet is a summary. It may not cover all possible information. If you have questions about this medicine, talk to your doctor, pharmacist, or health care provider.  2022 Elsevier/Gold Standard (2021-09-13 00:00:00)

## 2022-09-29 ENCOUNTER — Telehealth: Payer: Self-pay | Admitting: Family Medicine

## 2022-09-29 NOTE — Telephone Encounter (Signed)
If medication is causing confusion and lets have him stop it-there is a chance that the confusion is coming from COVID itself-oftentimes dementia worsens after COVID unfortunately  Since issues seemed to start 4 days prior to September 30 per urgent care note at that time-he would not currently be in timeframe to switch to an alternate medication-I think the main treatment at this time would be holding the Paxlovid

## 2022-09-29 NOTE — Telephone Encounter (Signed)
See below

## 2022-09-29 NOTE — Telephone Encounter (Signed)
Pt was prescribed Paxlovid due to being Covid+. Since he started it, his confusion has gotten much worse. Is there something else he can take? He only has a slight cough but it was suggested to take a medication for a viral infection. Or can he take something over the counter, such as Coricidin for high BP. Please advise

## 2022-10-03 ENCOUNTER — Ambulatory Visit (INDEPENDENT_AMBULATORY_CARE_PROVIDER_SITE_OTHER): Payer: Medicare Other | Admitting: Family Medicine

## 2022-10-03 ENCOUNTER — Telehealth: Payer: Self-pay | Admitting: Family Medicine

## 2022-10-03 ENCOUNTER — Encounter: Payer: Self-pay | Admitting: Family Medicine

## 2022-10-03 VITALS — BP 110/60 | HR 65 | Temp 98.4°F | Ht 70.0 in | Wt 223.8 lb

## 2022-10-03 DIAGNOSIS — I10 Essential (primary) hypertension: Secondary | ICD-10-CM

## 2022-10-03 DIAGNOSIS — F03C0 Unspecified dementia, severe, without behavioral disturbance, psychotic disturbance, mood disturbance, and anxiety: Secondary | ICD-10-CM

## 2022-10-03 DIAGNOSIS — U071 COVID-19: Secondary | ICD-10-CM | POA: Diagnosis not present

## 2022-10-03 NOTE — Patient Instructions (Addendum)
Appears to be healing from COVID.  Since he did not seem to tolerate the Paxlovid lets hold off on that medication.  May have some lingering confusion-I have seen several patients with dementia not bounced back as far as memory goes after COVID.  If worsening symptoms with memory or other symptoms at this point please return to see us-hopefully continues to gradually improve  Recommended follow up: Return for as needed for new, worsening, persistent symptoms.

## 2022-10-03 NOTE — Telephone Encounter (Signed)
Lm for pt tcb. 

## 2022-10-03 NOTE — Progress Notes (Signed)
Phone [phone] In person visit   Subjective:   Jerry [name] is a 86 y.o. year old very pleasant male patient who presents for/with See problem oriented charting Chief Complaint  Patient presents with   Follow-up    F/U on confusion    Past Medical History-  Patient Active Problem List   Diagnosis Date Noted   History of lung cancer 09/18/2017    Priority: High   Severe dementia ([place]) 07/17/2015    Priority: High   CKD (chronic kidney disease), stage III ([place]) 02/08/2017    Priority: Medium    Hyperlipidemia 10/15/2014    Priority: Medium    Gout of big toe 08/04/2008    Priority: Medium    Essential hypertension 07/25/2007    Priority: Medium    PROSTATE CANCER, HX OF 07/25/2007    Priority: Medium    Aortic atherosclerosis ([facility]) 01/06/2018    Priority: Low   Pulmonary emphysema ([place])     Priority: Low   Parotiditis 02/08/2017    Priority: Low   Allergic rhinitis 10/15/2014    Priority: Low   Overactive bladder 10/15/2014    Priority: Low   CARDIAC MURMUR 01/10/2011    Priority: Low   ORGANIC IMPOTENCE 08/12/2010    Priority: Low   Urinary incontinence 08/02/2022    Medications- reviewed and updated Current Outpatient Medications  Medication Sig Dispense Refill   amLODipine-olmesartan (AZOR) 5-40 MG tablet TAKE ONE TABLET BY MOUTH EVERY DAY 90 tablet 0   diclofenac Sodium (VOLTAREN) 1 % GEL Apply 2 g topically 4 (four) times daily. 100 g 3   donepezil (ARICEPT) 10 MG tablet TAKE ONE TABLET BY MOUTH EVERY DAY 90 tablet PRN   ferrous sulfate 325 (65 FE) MG tablet Take three times a week 40 tablet 3   Multiple Vitamin (MULTIVITAMIN) tablet Take 1 tablet by mouth daily. Centrum silver     rosuvastatin (CRESTOR) 20 MG tablet TAKE ONE TABLET BY MOUTH EVERY DAY 90 tablet PRN   No current facility-administered medications for this visit.     Objective:  BP 110/60   Pulse 65   Temp 98.4 F (36.9 C)   Ht 5\' 10"  (1.778 m)   Wt 223 lb 12.8 oz (101.5 kg)    SpO2 96%   BMI 32.11 kg/m  Gen: NAD, resting comfortably, sleepy at times during visit, answers some short questions but defers mostly to his daughter CV: RRR no murmurs rubs or gallops Lungs: CTAB no crackles, wheeze, rhonchi Abdomen: soft/nontender/nondistended/normal bowel sounds.  Ext: no edema Skin: warm, dry     Assessment and Plan    # Covid 19 S:tested positive on 09-25-22.  Started with symptoms Friday before last.  On September 23, 2022 presented to the hospital with chest pain but EKG was unchanged from prior and had reproducible pain on palpation.  Reassuring chest x-ray with no pneumonia or pneumothorax.  They thought he was low risk for pulmonary embolism.  Delta troponins reported as negative and CBC and BMP were largely reassuring other than CKD stage III-discharged with Tylenol.  We saw him for hospital follow-up on 09/20/2022-did not have respiratory some lungs at that time and chest pain had resolved and no exposure to COVID at that point  He went to urgent care on on September 24, 2022 for runny nose and subjective fever with known contact for COVID-19 ultimately testing positive then had a virtual visit with Dr. Maudie Mercury on 09/27/2022 and started on Paxlovid full dose-pharmacist later changed to  renal adjusted dose.  Patient did not tolerate medicine and after a few doses seemed more confused and agitated-when family stopped this he started to feel better.  Still has 3 days of medicine left and family request my opinion on restarting.  He did have some mild congested sounds and cough yesterday slightly worse than the day prior  Patient still has slight increased confusion from his baseline and apparently this has been reported by his adult day facility but unclear how far back this went A/P: COVID-19-did not tolerate paxlovid-she is on day 10 and has 3 pills remaining and I thought it was low yield for him to restart this especially with reported increased confusion on  medication-we opted to hold off.  Prior urinalysis without UTI.  Considered blood work but given overall improvement we opted to hold off for now  #Dementia S: Medication: Donepezil 10 mg -Did not tolerate Namenda due to dizziness -Most recent neuroimaging 03/25/2021 head CT without contrast due to delirium-no acute intracranial pathology but did have some small vessel white matter disease and prior to that 07/13/2015 MRI of the brain-no obvious cause for memory loss A/P: Slightly worsened confusion with COVID-continue current medications for now   #hypertension S: medication: Amlodipine-olmesartan 5-40 mg A/P:  Controlled. Continue current medications.    Recommended follow up: Return for as needed for new, worsening, persistent symptoms. Future Appointments  Date Time Provider [facility]  12/13/2022  9:00 AM [facility] CCM PHARMACIST [facility]  02/13/2023  3:00 PM Gery Pray, MD [facility] None    Lab/Order associations:   ICD-10-CM   1. COVID-19  U07.1     2. Severe dementia without behavioral disturbance, psychotic disturbance, mood disturbance, or anxiety, unspecified dementia type ([place])  F03.C0     3. Essential hypertension  I10      No orders of the defined types were placed in this encounter.   Return precautions advised.  Garret Reddish, MD

## 2022-10-03 NOTE — Telephone Encounter (Signed)
Patient Name: Jerry [name] Gender: Male DOB: 09/26/1936 Age: 86 Y 2 M 21 D Return Phone Number: [phone] (Primary) Address: City/ State/ Zip: [place]  [address] Client [facility] [place] Client Site [facility] [place] Night Provider Garret Reddish- MD Contact Type Call Who Is Calling Patient / Member / Family / Caregiver Call Type Triage / Clinical Caller Name Ellard Nan Relationship To Patient Daughter Return Phone Number [phone] (Primary) Chief Complaint Strange, Abnormal, or Paranoid Behavior Reason for Call Symptomatic / Request for [place] states her dad has dementia and she has questions regarding advice she received earlier. He has confusion and she was advised to go to the ER, but he has dementia, so she's looking for alternate advice. Translation No Nurse Assessment Nurse: [name], RN, [place] Date/Time (Eastern Time): 10/01/2022 9:18:21 PM Confirm and document reason for call. If symptomatic, describe symptoms. ---Caller states that she was told to go to ED with father this morning because of increased confusion. Caller states that he is still having the same sx that he has been having the past two nights. Increasing confusion and disorientation. Caller states that pt is unable to recognize own house and is requesting that he go home. Does the patient have any new or worsening symptoms? ---No Please document clinical information provided and list any resource used. ---RN recommended caller follow advise given to her by previous nurse. RN explained to caller why it was suggested that he be seen. Caller states that they will take him into ED. Disp. Time Eilene Ghazi Time) Disposition Final User 10/01/2022 9:25:52 PM Clinical Call Yes [name] RN, [place] Final Disposition 10/01/2022 9:25:52 PM Clinical Call Yes [name], RN, Wilburn Mylar

## 2022-10-03 NOTE — Telephone Encounter (Signed)
Pt was advised to go to ED.   Patient Name: Jerry [name] Gender: Male DOB: 09-Jul-1936 Age: 86 Y 2 M 20 D Return Phone Number: [phone] (Primary) Address: City/ State/ Zip: [place]  [address] Client [facility] [place] Client Site [facility] [place] Night Provider Garret Reddish- MD Contact Type Call Who Is Calling Patient / Member / Family / Caregiver Call Type Triage / Clinical Caller Name Flemon Kelty Relationship To Patient Daughter Return Phone Number [phone] (Primary) Chief Complaint Medication reaction Reason for Call Symptomatic / Request for [place] states she believes the Rx of Paxlovid, is making her father is making his dementia worse so she stop the Rx and she wants to know if she should get a new Rx. Translation No Nurse Assessment Nurse: Nicki Reaper, RN, Malachy Mood Date/Time (Eastern Time): 10/01/2022 10:00:13 AM Confirm and document reason for call. If symptomatic, describe symptoms. ---Caller states her father was Dx with Covid and Rx for Paxlovid given on Monday and he started medication Monday and he received 1 pill, on Tuesday morning he took 2 pill in morning and evening, and 2 on Wednesday morning, he was restless and angry Wednesday and she stopped medication, she called office on Friday and no one returned her call or she missed the call, medication seems to make his Dementia worse, he is more agitated, he is not sure where he is when he is at his own house, today continues to have some confusion but is following directions, continues to have some congestion with coughing every 2 hours, denies runny nose, fever and other symptoms, Does the patient have any new or worsening symptoms? ---Yes Will a triage be completed? ---Yes Related visit to physician within the last 2 weeks? ---Yes Does the PT have any chronic conditions? (i.e. diabetes, asthma,  this includes High risk factors for pregnancy, etc.) ---Yes List chronic conditions. ---HTN Nurse Assessment Is this a behavioral health or substance abuse call? ---No Guidelines Guideline Title Affirmed Question Affirmed Notes Nurse Date/Time (Eastern Time) Dementia Symptoms and Questions [1] Worsening confusion AND [2] new-onset (hours to 3 days) Nicki Reaper, RN, Malachy Mood 10/01/2022 10:27:54 AM Disp. Time Eilene Ghazi Time) Disposition Final User 10/01/2022 9:29:59 AM Attempt made - no message left Nicki Reaper, RNMalachy Mood 10/01/2022 10:29:16 AM Go to ED Now Yes Nicki Reaper, RN, Malachy Mood Final Disposition 10/01/2022 10:29:16 AM Go to ED Now Yes Nicki Reaper, RN, Erskine Speed Disagree/Comply Disagree Caller Understands Yes PreDisposition Call Doctor Care Advice Given Per Guideline GO TO ED NOW: * You need to be seen in the Emergency Department. Comments User: Burna Sis, RN Date/Time Eilene Ghazi Time): 10/01/2022 9:30:19 AM Mailbox is full, unable to leave message User: [place], Dyann Ruddle Date/Time (Eastern Time): 10/01/2022 9:54:44 AM [facility]WN. Caller states she had a miss call from the nurse and was calling back. User: Burna Sis, RN Date/Time Eilene Ghazi Time): 10/01/2022 10:30:20 AM Refused ER Referrals GO TO FACILITY REFUSED

## 2022-10-06 ENCOUNTER — Emergency Department (HOSPITAL_COMMUNITY): Payer: Medicare Other

## 2022-10-06 ENCOUNTER — Emergency Department (HOSPITAL_COMMUNITY)
Admission: EM | Admit: 2022-10-06 | Discharge: 2022-10-06 | Disposition: A | Discharge status: Home / Self Care | Payer: Medicare Other | Attending: Emergency Medicine | Admitting: Emergency Medicine

## 2022-10-06 ENCOUNTER — Ambulatory Visit (INDEPENDENT_AMBULATORY_CARE_PROVIDER_SITE_OTHER): Payer: Medicare Other | Admitting: Internal Medicine

## 2022-10-06 ENCOUNTER — Encounter: Payer: Self-pay | Admitting: Internal Medicine

## 2022-10-06 VITALS — BP 126/66 | HR 67 | Temp 98.2°F | Resp 14 | Ht 70.0 in | Wt 223.8 lb

## 2022-10-06 DIAGNOSIS — R6889 Other general symptoms and signs: Secondary | ICD-10-CM | POA: Diagnosis not present

## 2022-10-06 DIAGNOSIS — Z789 Other specified health status: Secondary | ICD-10-CM | POA: Diagnosis not present

## 2022-10-06 DIAGNOSIS — R42 Dizziness and giddiness: Secondary | ICD-10-CM

## 2022-10-06 DIAGNOSIS — R410 Disorientation, unspecified: Secondary | ICD-10-CM

## 2022-10-06 DIAGNOSIS — R404 Transient alteration of awareness: Secondary | ICD-10-CM | POA: Diagnosis not present

## 2022-10-06 DIAGNOSIS — Z743 Need for continuous supervision: Secondary | ICD-10-CM | POA: Diagnosis not present

## 2022-10-06 LAB — CBC WITH DIFFERENTIAL/PLATELET
Abs Immature Granulocytes: 0.03 10*3/uL (ref 0.00–0.07)
Basophils Absolute: 0 10*3/uL (ref 0.0–0.1)
Basophils Relative: 1 %
Eosinophils Absolute: 0.1 10*3/uL (ref 0.0–0.5)
Eosinophils Relative: 2 %
HCT: 45.3 % (ref 39.0–52.0)
Hemoglobin: 14 g/dL (ref 13.0–17.0)
Immature Granulocytes: 0 %
Lymphocytes Relative: 30 %
Lymphs Abs: 2.4 10*3/uL (ref 0.7–4.0)
MCH: 28.4 pg (ref 26.0–34.0)
MCHC: 30.9 g/dL (ref 30.0–36.0)
MCV: 91.9 fL (ref 80.0–100.0)
Monocytes Absolute: 0.5 10*3/uL (ref 0.1–1.0)
Monocytes Relative: 6 %
Neutro Abs: 5 10*3/uL (ref 1.7–7.7)
Neutrophils Relative %: 61 %
Platelets: 208 10*3/uL (ref 150–400)
RBC: 4.93 MIL/uL (ref 4.22–5.81)
RDW: 15.4 % (ref 11.5–15.5)
WBC: 8.1 10*3/uL (ref 4.0–10.5)
nRBC: 0 % (ref 0.0–0.2)

## 2022-10-06 LAB — BASIC METABOLIC PANEL
Anion gap: 7 (ref 5–15)
BUN: 16 mg/dL (ref 8–23)
CO2: 19 mmol/L — ABNORMAL LOW (ref 22–32)
Calcium: 8.4 mg/dL — ABNORMAL LOW (ref 8.9–10.3)
Chloride: 118 mmol/L — ABNORMAL HIGH (ref 98–111)
Creatinine, Ser: 1.45 mg/dL — ABNORMAL HIGH (ref 0.61–1.24)
GFR, Estimated: 48 mL/min — ABNORMAL LOW (ref >60)
Glucose, Bld: 99 mg/dL (ref 70–99)
Potassium: 4.1 mmol/L (ref 3.5–5.1)
Sodium: 144 mmol/L (ref 135–145)

## 2022-10-06 LAB — URINALYSIS, ROUTINE W REFLEX MICROSCOPIC
Bilirubin Urine: NEGATIVE
Glucose, UA: NEGATIVE mg/dL
Hgb urine dipstick: NEGATIVE
Ketones, ur: NEGATIVE mg/dL
Leukocytes,Ua: NEGATIVE
Nitrite: NEGATIVE
Protein, ur: NEGATIVE mg/dL
Specific Gravity, Urine: 1.023 (ref 1.005–1.030)
pH: 5 (ref 5.0–8.0)

## 2022-10-06 MED ORDER — LACTATED RINGERS IV BOLUS
1000.0000 mL | Freq: Once | INTRAVENOUS | Status: AC
  Administered 2022-10-06: 1000 mL via INTRAVENOUS

## 2022-10-06 NOTE — Progress Notes (Signed)
[place] at [employer]:  [phone]   Routine Medical Office Visit  Patient:  Jerry [name]      Age: 86 y.o.       Sex:  male  Date:   10/06/2022  PCP:    Marin Olp, MD    Masiel Gentzler Provider: Loralee Pacas, MD  Assessment/Plan:   Blaise was seen today for dizziness and altered mental status.  Dizziness  Disorientation  Need for follow-up by social worker  Long discussion with this patient's daughter who is taking care of him and because the patient would not really able to give a reliable history.  I think there is a need for evaluate for delirium at the hospital and social work consult to make sure that the level of care he has that is appropriate now that he may have progressed dementia and not delirium.  Arranged ambulance transport due to above and challenges with getting patient reliably agreeable for private transport.  I called and arranged and remained available until they came and transported him to ER.  Return in about 2 weeks (around 10/20/2022) for hospital follow up, Dr. Yong Channel preferred, ccm if possible.      Subjective:   Jerry [name] is a 86 y.o. male with PMH significant for: Past Medical History:  Diagnosis Date   Cancer of prostate ([place]) 2001   Chronic kidney disease (CKD), stage III (moderate) ([place])    /notes 02/08/2017   Dementia ([place])    DIVERTICULOSIS, COLON 07/25/2007   Qualifier: Diagnosis of  By: [name], LPN, [name]    High cholesterol    History of radiation therapy 05/01/17-05/09/17   right lung 54 Gy in 3 fractions   History of radiation therapy    prostate radioactive seed placement 07/2000  Dr Valere Dross   Hypertension    Lung cancer [facility]) 03/2017    He is presenting today with: Chief Complaint  Patient presents with   Dizziness    This morning when he woke up and set up in the bed.   Altered Mental Status    Asked about his mother (whom is deceased), which was abnormal and was confused.  Daughter doesn't want to discuss in detail in front of patient. Has stopped taking Paxlovid as of Wednesday (causes aggression in patient in the evening).     Additional physician collected history: Had covid the Friday before Completed paxlovid Had confusion noted with covid 2 Fridays ago Brought by [name] who lives with him since 2021 and essentially all history taken from her  because patient poor historian and drowsy/sleepy.  She lives with and with help of niece helps him to get all his meds Dizziness/ataxia, and ams have been progressive worsening.  Dizziness Reported occurred while getting dress Caught the rail to stop from falling Keeps happening for all morning Last known well time on dizziness is last night- the dizziness and falling is gone now.  Has been asked when he is going home Has been difficult to get compliance for going to hospital as advised due to AMS / dementia Asked about his mother who is deceased Daughter brought him in because he is wobbling/dizzy/mental status seems worse . she was told to go to ED with father this morning because of increased confusion. Caller states that he is still having the same sx that he has been having the past two nights. Increasing confusion and disorientation. Caller states that pt is unable to recognize own house and  is requesting that he go home. Feels like its just a big wait at the hospital She has difficulty with waiting in ER for long time due to behavioral disturbances.  Started to go the other day to ER.  09/29/22 Dr. Yong Channel stated "If medication is causing confusion and lets have him stop it-there is a chance that the confusion is coming from COVID itself-oftentimes dementia worsens after COVID unfortunately- Since issues seemed to start 4 days prior to September 30 per urgent care note at that time-he would not currently be in timeframe to switch to an alternate medication-I think the main treatment at this time would be  holding the Paxlovid" He normally lives at home.  Daughter usually checks on and has sitter several days per week.  Also he is in [facility] day program for dementia. He has been showing more aggression there recently- maybe a month ago per social work              Objective:  Physical Exam: BP 126/66 (BP Location: Left Arm, Patient Position: Sitting)   Pulse 67   Temp 98.2 F (36.8 C) (Temporal)   Resp 14   Ht 5\' 10"  (1.778 m)   Wt 223 lb 12.8 oz (101.5 kg)   SpO2 94%   BMI 32.11 kg/m   He  is confused.  Daughter gives all history. He cannot give any, but knows his name. Constitutional: NAD, AAO, not ill-appearing  Neuro: drowsy, no focal deficit obvious, articulate speech but nonsensical (no slurring) gait is pretty steady Psych: inappropriate fluctuating mood, behavior, thought content   Problem specific physical exam findings:  Drowsy, sleepy, poor historian, poor command following. Able to walk ok at this time.  Most verbal statements are nonsensical.   Results:  No results found for any visits on 10/06/22.   Recent Results (from the past 2160 hour(s))  POCT urinalysis dipstick     Status: Abnormal   Collection Time: 07/18/22  3:04 PM  Result Value Ref Range   Color, UA yellow    Clarity, UA cleat neg    Glucose, UA Negative Negative   Bilirubin, UA positive     Comment: 1+ (1 mg)   Ketones, UA neg    Spec Grav, UA >=1.030 (A) 1.010 - 1.025   Blood, UA neg    pH, UA 5.0 5.0 - 8.0   Protein, UA Negative Negative   Urobilinogen, UA 0.2 0.2 or 1.0 E.U./dL   Nitrite, UA neg    Leukocytes, UA Negative Negative   Appearance     Odor    Urine Culture     Status: None   Collection Time: 07/18/22  4:18 PM   Specimen: Urine  Result Value Ref Range   MICRO NUMBER: [id number]    SPECIMEN QUALITY: Adequate    Sample Source URINE    STATUS: FINAL    Result:      Mixed genital flora isolated. These superficial bacteria are not indicative of a urinary tract  infection. No further organism identification is warranted on this specimen. If clinically indicated, recollect clean-catch, mid-stream urine and transfer  immediately to Urine Culture Transport Tube.   Basic metabolic panel     Status: Abnormal   Collection Time: 09/17/22 12:23 PM  Result Value Ref Range   Sodium 143 135 - 145 mmol/L   Potassium 4.6 3.5 - 5.1 mmol/L   Chloride 113 (H) 98 - 111 mmol/L   CO2 25 22 - 32 mmol/L   Glucose, Bld  92 70 - 99 mg/dL    Comment: Glucose reference range applies only to samples taken after fasting for at least 8 hours.   BUN 17 8 - 23 mg/dL   Creatinine, Ser 1.50 (H) 0.61 - 1.24 mg/dL   Calcium 9.1 8.9 - 10.3 mg/dL   GFR, Estimated 46 (L) >60 mL/min    Comment: (NOTE) Calculated using the CKD-EPI Creatinine Equation (2021)    Anion gap 5 5 - 15    Comment: Performed at [facility] [address], [place], [place] [address]  CBC     Status: Abnormal   Collection Time: 09/17/22 12:23 PM  Result Value Ref Range   WBC 7.9 4.0 - 10.5 K/uL   RBC 4.87 4.22 - 5.81 MIL/uL   Hemoglobin 14.1 13.0 - 17.0 g/dL   HCT 43.7 39.0 - 52.0 %   MCV 89.7 80.0 - 100.0 fL   MCH 29.0 26.0 - 34.0 pg   MCHC 32.3 30.0 - 36.0 g/dL   RDW 15.9 (H) 11.5 - 15.5 %   Platelets 181 150 - 400 K/uL   nRBC 0.0 0.0 - 0.2 %    Comment: Performed at [facility], [place] [address], [place], [place] [address]  Troponin I (High Sensitivity)     Status: None   Collection Time: 09/17/22 12:23 PM  Result Value Ref Range   Troponin I (High Sensitivity) 10 <18 ng/L    Comment: (NOTE) Elevated high sensitivity troponin I (hsTnI) values and significant  changes across serial measurements may suggest ACS but many other  chronic and acute conditions are known to elevate hsTnI results.  Refer to the "Links" section for chest pain algorithms and additional  guidance. Performed at [facility], [place] [address], [place], [place] [address]   Troponin I (High Sensitivity)      Status: None   Collection Time: 09/17/22  3:12 PM  Result Value Ref Range   Troponin I (High Sensitivity) 10 <18 ng/L    Comment: (NOTE) Elevated high sensitivity troponin I (hsTnI) values and significant  changes across serial measurements may suggest ACS but many other  chronic and acute conditions are known to elevate hsTnI results.  Refer to the "Links" section for chest pain algorithms and additional  guidance. Performed at [facility], [place] [address], [place], [place] [address]   POC Urinalysis dipstick     Status: Abnormal   Collection Time: 09/24/22 12:03 PM  Result Value Ref Range   Glucose, UA NEGATIVE NEGATIVE mg/dL   Bilirubin Urine NEGATIVE NEGATIVE   Ketones, ur NEGATIVE NEGATIVE mg/dL   Specific Gravity, Urine 1.025 1.005 - 1.030   Hgb urine dipstick TRACE (A) NEGATIVE   pH 5.0 5.0 - 8.0   Protein, ur 30 (A) NEGATIVE mg/dL   Urobilinogen, UA 0.2 0.0 - 1.0 mg/dL   Nitrite NEGATIVE NEGATIVE   Leukocytes,Ua NEGATIVE NEGATIVE    Comment: Biochemical Testing Only. Please order routine urinalysis from main lab if confirmatory testing is needed.  SARS CORONAVIRUS 2 (TAT 6-24 HRS) Anterior Nasal Swab     Status: Abnormal   Collection Time: 09/24/22  3:15 PM   Specimen: Anterior Nasal Swab  Result Value Ref Range   SARS Coronavirus 2 POSITIVE (A) NEGATIVE    Comment: (NOTE) SARS-CoV-2 target nucleic acids are DETECTED.  The SARS-CoV-2 RNA is generally detectable in upper and lower respiratory specimens during the acute phase of infection. Positive results are indicative of the presence  of SARS-CoV-2 RNA. Clinical correlation with patient history and other diagnostic information is  necessary to determine patient infection status. Positive results do not rule out bacterial infection or co-infection with other viruses.  The expected result is Negative.  Fact Sheet for Patients: SugarRoll.be  Fact Sheet for Healthcare  Providers: https://www.woods-mathews.com/  This test is not yet approved or cleared by the [place] FDA and  has been authorized for detection and/or diagnosis of SARS-CoV-2 by FDA under an Emergency Use Authorization (EUA). This EUA will remain  in effect (meaning this test can be used) for the duration of the COVID-19 declaration under Section 564(b)(1) of the Act, 21 U. S.C. section 360bbb-3(b)(1), unless the authorization is terminated or revoked sooner.   Performed at [facility], [place] [address], [place], [place] [address]

## 2022-10-06 NOTE — ED Triage Notes (Signed)
Ems brings pt in from home. Pt states he was dizzy this morning. Pt went to PCP but was no longer dizzy. Pt sent to ER for further evaluation. Pt denies dizziness at this time.

## 2022-10-06 NOTE — ED Notes (Signed)
Patient transported to CT 

## 2022-10-06 NOTE — Patient Instructions (Addendum)
It was a pleasure seeing you today!  Today the plan is...  Go to hospital by ambulance for  Dizziness  Disorientation  Need for follow-up by social worker     Loralee Pacas, MD   Return in about 2 weeks (around 10/20/2022) for hospital follow up, Dr. Yong Channel preferred, ccm if possible.

## 2022-10-06 NOTE — ED Provider Notes (Signed)
[name]   CSN: [id number] Arrival date & time: 10/06/22  1318     History Chief Complaint  Patient presents with   Dizziness    HPI Jerry [name] is a 86 y.o. male presenting for multiple complaints.  Patient has no concerns himself.  He feels fine he would like to be discharged.  However patient's daughter states that earlier today he was confused did not know what year it was and was intermittently dizzy taking longer to get ready than his normal.  He missed his daycare program for 2 days in a row.  Patient's daughter states that he normally is functional.  Denies fevers or chills, nausea vomiting, syncope shortness of breath and is otherwise ambulatory tolerating p.o. intake.  Patient's daughter she states that he has no return of his baseline has no further concerns with his care but was told by the doctor to bring him to the emergency department today for a "head scan". Chart review reveals that there is increasing concern for patient's safety at home.  Pulled daughter side to discuss this.  She has no concerns Jerry [name], he has been eating well since he was recovered from [place] and he has been bathing himself and she feels comfortable take care of him.  She does acknowledge that they are considering long-term placement but that they have not made a long-term decision yet.  Planning to discuss with family when they come in town for the holidays.  No falls or other injuries..   Patient's recorded medical, surgical, social, medication list and allergies were reviewed in the Snapshot window as part of the initial history.   Review of Systems   Review of Systems  Constitutional:  Negative for chills and fever.  HENT:  Negative for ear pain and sore throat.   Eyes:  Negative for pain and visual disturbance.  Respiratory:  Negative for cough and shortness of breath.   Cardiovascular:  Negative for chest pain and palpitations.   Gastrointestinal:  Negative for abdominal pain and vomiting.  Genitourinary:  Negative for dysuria and hematuria.  Musculoskeletal:  Negative for arthralgias and back pain.  Skin:  Negative for color change and rash.  Neurological:  Positive for dizziness. Negative for seizures and syncope.  All other systems reviewed and are negative.   Physical Exam Updated Vital Signs BP 137/67   Pulse (!) 59   Resp 18   SpO2 100%  Physical Exam Vitals and nursing note reviewed.  Constitutional:      General: He is not in acute distress.    Appearance: He is well-developed.  HENT:     Head: Normocephalic and atraumatic.  Eyes:     Conjunctiva/sclera: Conjunctivae normal.  Cardiovascular:     Rate and Rhythm: Normal rate and regular rhythm.     Heart sounds: No murmur heard. Pulmonary:     Effort: Pulmonary effort is normal. No respiratory distress.     Breath sounds: Normal breath sounds.  Abdominal:     Palpations: Abdomen is soft.     Tenderness: There is no abdominal tenderness.  Musculoskeletal:        General: No swelling.     Cervical back: Neck supple.  Skin:    General: Skin is warm and dry.     Capillary Refill: Capillary refill takes less than 2 seconds.  Neurological:     General: No focal deficit present.     Mental Status: He is alert. Mental status is  at baseline.     Cranial Nerves: No cranial nerve deficit.     Motor: No weakness.  Psychiatric:        Mood and Affect: Mood normal.      ED Course/ Medical Decision Making/ A&P    Procedures Procedures   Medications Ordered in ED Medications  lactated ringers bolus 1,000 mL (1,000 mLs Intravenous New Bag/Given 10/06/22 1427)    Medical Decision Making:    Jerry [name] is a 86 y.o. male who presented to the ED today with episode of AMS detailed above.     Patient's presentation is complicated by their history of advanced age, dementia, underlying CKD.  Patient placed on continuous vitals and  telemetry monitoring while in ED which was reviewed periodically.   Complete initial physical exam performed, notably the patient  was hemodynamically stable in no acute distress.      Reviewed and confirmed nursing documentation for past medical history, family history, social history.    Initial Assessment:   With the patient's presentation of altered mental status, most likely diagnosis is delirium likely secondary to underlying dementia. Other diagnoses were considered including (but not limited to) active infection, metabolic disturbance, endocrinologic disturbance, CVA, intracranial hemorrhage. These are considered less likely due to history of present illness and physical exam findings.   This is most consistent with an acute life/limb threatening illness complicated by underlying chronic conditions.  Initial Plan:  Evaluate for structural intracranial pathology with CT head, given duration of symptoms and low severity of symptoms, no indication to proceed with MRI of multiple Screening labs including CBC and Metabolic panel to evaluate for infectious or metabolic etiology of disease.  Urinalysis with reflex culture ordered to evaluate for UTI or relevant urologic/nephrologic pathology.  EKG to evaluate for cardiac pathology. Objective evaluation as below reviewed with plan for close reassessment  Initial Study Results:   Laboratory  All laboratory results reviewed without evidence of clinically relevant pathology.     EKG EKG was reviewed independently. Rate, rhythm, axis, intervals all examined and without medically relevant abnormality. ST segments without concerns for elevations.    Radiology  All images reviewed independently. Agree with radiology report at this time.   CT HEAD WO CONTRAST (5MM)  Result Date: 10/06/2022 CLINICAL DATA:  Head trauma, minor (Age >= 65y).  Dizziness. EXAM: CT HEAD WITHOUT CONTRAST TECHNIQUE: Contiguous axial images were obtained from the base of  the skull through the vertex without intravenous contrast. RADIATION DOSE REDUCTION: This exam was performed according to the departmental dose-optimization program which includes automated exposure control, adjustment of the mA and/or kV according to patient size and/or use of iterative reconstruction technique. COMPARISON:  03/15/2021 FINDINGS: Brain: There is atrophy and chronic small vessel disease changes. No acute intracranial abnormality. Specifically, no hemorrhage, hydrocephalus, mass lesion, acute infarction, or significant intracranial injury. Vascular: No hyperdense vessel or unexpected calcification. Skull: No acute calvarial abnormality. Sinuses/Orbits: No acute findings Other: None IMPRESSION: Atrophy, chronic microvascular disease. No acute intracranial abnormality. Electronically Signed   By: Rolm Baptise M.D.   On: 10/06/2022 14:22     Final Assessment and Plan:   I was called back to bedside for patient.  Family members requesting discharge.  She feels patient has been at his baseline since he got here and patient is requesting discharge.  He is ambulatory tolerating p.o. intake.  Results are grossly reassuring at this time and given otherwise well appearance, I do not believe there is any indication for  further intervention at this time.  Patient stable for outpatient follow-up with primary care provider and outpatient evaluation for need for escalation of support. Disposition:  Based on the above findings, I believe patient is stable for discharge.    Patient/family educated about specific return precautions for given chief complaint and symptoms.  Patient/family educated about follow-up with PCP.     Patient/family expressed understanding of return precautions and need for follow-up. Patient spoken to regarding all imaging and laboratory results and appropriate follow up for these results. All education provided in verbal form with additional information in written form. Time was  allowed for answering of patient questions. Patient discharged.    Emergency Department Medication Summary:   Medications  lactated ringers bolus 1,000 mL (1,000 mLs Intravenous New Bag/Given 10/06/22 1427)         Clinical Impression:  1. Dizziness      Discharge   Final Clinical Impression(s) / ED Diagnoses Final diagnoses:  Dizziness    Rx / DC Orders ED Discharge Orders     None         Tretha Sciara, MD 10/06/22 1443

## 2022-10-22 ENCOUNTER — Ambulatory Visit (HOSPITAL_COMMUNITY)
Admission: EM | Admit: 2022-10-22 | Discharge: 2022-10-22 | Disposition: A | Discharge status: Home / Self Care | Payer: Medicare Other | Attending: Physician Assistant | Admitting: Physician Assistant

## 2022-10-22 ENCOUNTER — Encounter (HOSPITAL_COMMUNITY): Payer: Self-pay

## 2022-10-22 DIAGNOSIS — J069 Acute upper respiratory infection, unspecified: Secondary | ICD-10-CM | POA: Diagnosis not present

## 2022-10-22 DIAGNOSIS — Z1152 Encounter for screening for COVID-19: Secondary | ICD-10-CM | POA: Insufficient documentation

## 2022-10-22 LAB — SARS CORONAVIRUS 2 (TAT 6-24 HRS): SARS Coronavirus 2: NEGATIVE

## 2022-10-22 NOTE — Discharge Instructions (Addendum)
COVID test will be completed in 48 hours.  If you do not get a call from this office that indicates the test is negative.  Log onto MyChart to review the results when it post in 48 hours. Advised to use only symptomatic treatment, Mucinex for congestion if needed. Advised follow-up PCP or return to urgent care as needed.

## 2022-10-22 NOTE — ED Provider Notes (Signed)
[place]    CSN: [id number] Arrival date & time: 10/22/22  1007      History   Chief Complaint Chief Complaint  Patient presents with   Covid Exposure    HPI Jerry [name] is a 86 y.o. male.   86 year old male presents with congestion.  Caregiver indicates that the patient goes to [facility] during the week.  She indicates that there has been some cases of COVID at the center.  Patient has started having over the past couple days mild runny nose and upper respiratory congestion.  Caregiver indicates he has not had any fever, chills, behavior changes.  She indicates he is eating and drinking fluids well.  She indicates that she needs a COVID test due to [facility] not wanting to allow the client back into the center unless there is a negative test.     Past Medical History:  Diagnosis Date   Cancer of prostate ([place]) 2001   Chronic kidney disease (CKD), stage III (moderate) ([place])    [name] 02/08/2017   Dementia ([place])    DIVERTICULOSIS, COLON 07/25/2007   Qualifier: Diagnosis of  By: [name], LPN, [name]    High cholesterol    History of radiation therapy 05/01/17-05/09/17   right lung 54 Gy in 3 fractions   History of radiation therapy    prostate radioactive seed placement 07/2000  Dr Valere Dross   Hypertension    Lung cancer ([place]) 03/2017    Patient Active Problem List   Diagnosis Date Noted   Urinary incontinence 08/02/2022   Aortic atherosclerosis ([place]) 01/06/2018   History of lung cancer 09/18/2017   Pulmonary emphysema ([place])    Parotiditis 02/08/2017   CKD (chronic kidney disease), stage III ([place]) 02/08/2017   Severe dementia ([place]) 07/17/2015   Hyperlipidemia 10/15/2014   Allergic rhinitis 10/15/2014   Overactive bladder 10/15/2014   CARDIAC MURMUR 01/10/2011   ORGANIC IMPOTENCE 08/12/2010   Gout of big toe 08/04/2008   Essential hypertension 07/25/2007   PROSTATE CANCER, HX OF 07/25/2007    Past Surgical History:  Procedure  Laterality Date   DIRECT LARYNGOSCOPY N/A 06/21/2017   Procedure: DIRECT LARYNGOSCOPY;  Surgeon: Melissa Montane, MD;  Location: [place];  Service: ENT;  Laterality: N/A;   [place]  07/2000   [name] 05/09/2011   PENILE PROSTHESIS IMPLANT  02/2002   Insertion of Mentor three piece inflatable penile prosthesis./notes 05/09/2011   PENILE PROSTHESIS PLACEMENT  06/2003   Placement of Mentor rod prosthesis/notes 05/09/2011   REFRACTIVE SURGERY Bilateral    REMOVAL OF PENILE PROSTHESIS  06/2003   Removal of three-piece Mentor prosthesis/notes 05/09/2011   SCROTAL SURGERY  06/2003   Repair of scrotal defect/notes 05/09/2011       Home Medications    Prior to Admission medications   Medication Sig Start Date End Date Taking? Authorizing Provider  amLODipine-olmesartan (AZOR) 5-40 MG tablet TAKE ONE TABLET BY MOUTH EVERY DAY 09/27/22  Yes Marin Olp, MD  diclofenac Sodium (VOLTAREN) 1 % GEL Apply 2 g topically 4 (four) times daily. 10/12/20  Yes Marin Olp, MD  donepezil (ARICEPT) 10 MG tablet TAKE ONE TABLET BY MOUTH EVERY DAY 09/01/22  Yes Marin Olp, MD  ferrous sulfate 325 (65 FE) MG tablet Take three times a week 11/16/21  Yes Marin Olp, MD  Multiple Vitamin (MULTIVITAMIN) tablet Take 1 tablet by mouth daily. Centrum silver   Yes [provider]  PAXLOVID, 300/100, 20 x 150 MG & 10 x 100MG  TBPK Take 2 tablets by mouth 2 (two) times daily. 09/27/22  Yes [provider]  rosuvastatin (CRESTOR) 20 MG tablet TAKE ONE TABLET BY MOUTH EVERY DAY 09/01/22  Yes Marin Olp, MD    Family History Family History  Problem Relation Age of Onset   Early death Father        cirrhosis   Alcoholism Father    Cirrhosis Other    Breast cancer Sister     Social History Social History   Tobacco Use   Smoking status: Former    Packs/day: 0.50    Types: Cigarettes    Quit date: 12/26/1988    Years since quitting: 33.8    Smokeless tobacco: Never   Tobacco comments:    "I don't remember how long really" (02/08/2017)  Vaping Use   Vaping Use: Never used  Substance Use Topics   Alcohol use: No   Drug use: No     Allergies   Namenda [memantine]   Review of Systems Review of Systems  HENT:  Positive for sinus pressure.      Physical Exam Triage Vital Signs ED Triage Vitals  Enc Vitals Group     BP 10/22/22 1051 108/70     Pulse Rate 10/22/22 1051 65     Resp 10/22/22 1051 16     Temp 10/22/22 1051 98.5 F (36.9 C)     Temp Source 10/22/22 1051 Oral     SpO2 10/22/22 1051 96 %     Weight --      Height --      Head Circumference --      Peak Flow --      Pain Score 10/22/22 1050 0     Pain Loc --      Pain Edu? --      Excl. in [place]? --    No data found.  Updated Vital Signs BP 108/70 (BP Location: Right Arm)   Pulse 65   Temp 98.5 F (36.9 C) (Oral)   Resp 16   SpO2 96%   Visual Acuity Right Eye Distance:   Left Eye Distance:   Bilateral Distance:    Right Eye Near:   Left Eye Near:    Bilateral Near:     Physical Exam Constitutional:      Appearance: Normal appearance.  HENT:     Right Ear: Tympanic membrane and ear canal normal.     Left Ear: Tympanic membrane and ear canal normal.     Mouth/Throat:     Mouth: Mucous membranes are moist.     Pharynx: Oropharynx is clear.  Cardiovascular:     Rate and Rhythm: Normal rate and regular rhythm.     Heart sounds: Normal heart sounds.  Pulmonary:     Effort: Pulmonary effort is normal.     Breath sounds: Normal air entry. No wheezing, rhonchi or rales.  Lymphadenopathy:     Cervical: No cervical adenopathy.  Neurological:     Mental Status: He is alert.      UC Treatments / Results  Labs (all labs ordered are listed, but only abnormal results are displayed) Labs Reviewed  SARS CORONAVIRUS 2 (TAT 6-24 HRS)    EKG   Radiology No results found.  Procedures Procedures (including critical care  time)  Medications Ordered in UC Medications - No data to display  Initial Impression / Assessment and Plan / UC Course  I  have reviewed the triage vital signs and the nursing notes.  Pertinent labs & imaging results that were available during my care of the patient were reviewed by me and considered in my medical decision making (see chart for details).    Plan: 1.  Screening for COVID will be treated with the following: A.  Treatment will be adjusted depending on the results of the COVID test. 2.  Upper respiratory tract infection will be treated with the following: A.  Advised symptomatic treatment only, Mucinex every 12 hours as needed for congestion. 3.  Advised follow-up PCP return to urgent care if symptoms fail to improve. Final Clinical Impressions(s) / UC Diagnoses   Final diagnoses:  Acute upper respiratory infection  Encounter for screening for COVID-19     Discharge Instructions      COVID test will be completed in 48 hours.  If you do not get a call from this office that indicates the test is negative.  Log onto MyChart to review the results when it post in 48 hours. Advised to use only symptomatic treatment, Mucinex for congestion if needed. Advised follow-up PCP or return to urgent care as needed.    ED Prescriptions   None    PDMP not reviewed this encounter.   Nyoka Lint, PA-C 10/22/22 1139

## 2022-10-22 NOTE — ED Triage Notes (Signed)
Patient daughter states Patient is in memory care during the week. Was informed that he has runny nose so needs a COVID test before he can go back.

## 2022-11-03 ENCOUNTER — Ambulatory Visit (INDEPENDENT_AMBULATORY_CARE_PROVIDER_SITE_OTHER): Payer: Medicare Other | Admitting: Family Medicine

## 2022-11-03 ENCOUNTER — Encounter: Payer: Self-pay | Admitting: Family Medicine

## 2022-11-03 ENCOUNTER — Ambulatory Visit: Payer: Medicare Other | Admitting: Physician Assistant

## 2022-11-03 VITALS — BP 119/63 | HR 64 | Temp 98.6°F | Ht 70.0 in | Wt 224.4 lb

## 2022-11-03 DIAGNOSIS — R350 Frequency of micturition: Secondary | ICD-10-CM | POA: Diagnosis not present

## 2022-11-03 LAB — POCT URINALYSIS DIPSTICK
Bilirubin, UA: NEGATIVE
Blood, UA: NEGATIVE
Glucose, UA: NEGATIVE
Ketones, UA: NEGATIVE
Leukocytes, UA: NEGATIVE
Nitrite, UA: NEGATIVE
Protein, UA: NEGATIVE
Spec Grav, UA: 1.015 (ref 1.010–1.025)
Urobilinogen, UA: 0.2 E.U./dL
pH, UA: 6 (ref 5.0–8.0)

## 2022-11-03 NOTE — Progress Notes (Signed)
Subjective:     Patient ID: Jerry [name], male    DOB: 02/24/1936, 86 y.o.   MRN: [id number]  Chief Complaint  Patient presents with   Urinary Frequency    Going to the bathroom more frequently, goes to a day program and the staff called yesterday because they noticed he had been going to the bathroom more     HPI-here w/dau Goes to [facility].  Noted that pt going to urinate more and wanted checked for UTI.  No F/C. No change in mentation.  H/o prostate ca. Some more accidents.  Pt dementia-denies dysuria,abd pain/cough/sob.   Health Maintenance Due  Topic Date Due   Medicare Annual Wellness (AWV)  03/22/2022    Past Medical History:  Diagnosis Date   Cancer of prostate ([place]) 2001   Chronic kidney disease (CKD), stage III (moderate) ([place])    /notes 02/08/2017   Dementia ([place])    DIVERTICULOSIS, COLON 07/25/2007   Qualifier: Diagnosis of  By: [name], LPN, [name]    High cholesterol    History of radiation therapy 05/01/17-05/09/17   right lung 54 Gy in 3 fractions   History of radiation therapy    prostate radioactive seed placement 07/2000  Dr Valere Dross   Hypertension    Lung cancer ([place]) 03/2017    Past Surgical History:  Procedure Laterality Date   DIRECT LARYNGOSCOPY N/A 06/21/2017   Procedure: DIRECT LARYNGOSCOPY;  Surgeon: Melissa Montane, MD;  Location: [facility];  Service: ENT;  Laterality: N/A;   EYE SURGERY     INSERTION PROSTATE RADIATION SEED  07/2000   [name] 05/09/2011   PENILE PROSTHESIS IMPLANT  02/2002   Insertion of Mentor three piece inflatable penile prosthesis./notes 05/09/2011   PENILE PROSTHESIS PLACEMENT  06/2003   Placement of Mentor rod prosthesis/notes 05/09/2011   REFRACTIVE SURGERY Bilateral    REMOVAL OF PENILE PROSTHESIS  06/2003   Removal of three-piece Mentor prosthesis/notes 05/09/2011   SCROTAL SURGERY  06/2003   Repair of scrotal defect/notes 05/09/2011    Outpatient Medications Prior to Visit  Medication Sig Dispense Refill    amLODipine-olmesartan (AZOR) 5-40 MG tablet TAKE ONE TABLET BY MOUTH EVERY DAY 90 tablet 0   diclofenac Sodium (VOLTAREN) 1 % GEL Apply 2 g topically 4 (four) times daily. 100 g 3   donepezil (ARICEPT) 10 MG tablet TAKE ONE TABLET BY MOUTH EVERY DAY 90 tablet PRN   ferrous sulfate 325 (65 FE) MG tablet Take three times a week 40 tablet 3   Multiple Vitamin (MULTIVITAMIN) tablet Take 1 tablet by mouth daily. Centrum silver     rosuvastatin (CRESTOR) 20 MG tablet TAKE ONE TABLET BY MOUTH EVERY DAY 90 tablet PRN   PAXLOVID, 300/100, 20 x 150 MG & 10 x 100MG  TBPK Take 2 tablets by mouth 2 (two) times daily. (Patient not taking: Reported on 11/03/2022)     No facility-administered medications prior to visit.    Allergies  Allergen Reactions   Namenda [Memantine] Other (See Comments)    Made pt feel dizzy   ROS neg/noncontributory except as noted HPI/below      Objective:     BP 119/63   Pulse 64   Temp 98.6 F (37 C) (Temporal)   Ht 5\' 10"  (1.778 m)   Wt 224 lb 6 oz (101.8 kg)   SpO2 98%   BMI 32.19 kg/m  Wt Readings from Last 3 Encounters:  11/03/22 224 lb 6 oz (101.8 kg)  10/06/22 223 lb  12.8 oz (101.5 kg)  10/03/22 223 lb 12.8 oz (101.5 kg)    Physical Exam   Gen: WDWN NAD Elderly male HEENT: NCAT, conjunctiva not injected, sclera nonicteric NECK:  supple, no thyromegaly, no nodes, no carotid bruits CARDIAC: RRR, S1S2+, no murmur. DP 2+B LUNGS: CTAB. No wheezes ABDOMEN:  BS+, soft, NTND, No HSM, no masses EXT:  no edema MSK: no gross abnormalities.  NEURO: A&O x1.  CN II-XII intact.  PSYCH: normal mood. Good eye contact  Results for orders placed or performed in visit on 11/03/22  POCT urinalysis dipstick  Result Value Ref Range   Color, UA YELLOW    Clarity, UA CLEAR    Glucose, UA Negative Negative   Bilirubin, UA NEG    Ketones, UA NEG    Spec Grav, UA 1.015 1.010 - 1.025   Blood, UA NEG    pH, UA 6.0 5.0 - 8.0   Protein, UA Negative Negative    Urobilinogen, UA 0.2 0.2 or 1.0 E.U./dL   Nitrite, UA NEG    Leukocytes, UA Negative Negative   Appearance     Odor          Assessment & Plan:   Problem List Items Addressed This Visit   None Visit Diagnoses     Urinary frequency    -  Primary   Relevant Orders   POCT urinalysis dipstick (Completed)      Urinary frequency-UA neg.  Could still be UTI-will check cx.  Could be oab, prostate, other.  Discussed poss tx if U cx neg.  She would prefer no tx and monitor for now.  Just ruling out UTI.    No orders of the defined types were placed in this encounter.   Wellington Hampshire, MD

## 2022-11-03 NOTE — Patient Instructions (Addendum)
Will wait on culture.  Urine is clear-no sign of infection.   Monitor frequency and how often going.  May need meds for that.  Don't think it's an infection.

## 2022-11-11 ENCOUNTER — Ambulatory Visit (HOSPITAL_COMMUNITY): Payer: Medicare Other

## 2022-12-02 NOTE — Progress Notes (Signed)
Chronic Care Management Pharmacy Note  12/13/2022 Name:  Jerry [name] MRN:  [id number] DOB:  03/27/1936  Summary: PharmD FU.  Living situation is still the same as previous.  [name] mentions continued confusion and progressing dementia.  They are discussing his care over the holidays with her other sister and determining the best course of action.  Jerry [name] prefers to keep him in home if possible but would like some aid so she has time for herself.  Recommendations/Changes made from today's visit: Jerry [name] would like to speak with social work if we can provide - will connect if ok with PCP  Plan: FU 6 months   Subjective: Jerry [name] is an 86 y.o. year old male who is a primary patient of Hunter, Brayton Mars, MD.  The CCM team was consulted for assistance with disease management and care coordination needs.    Engaged with patient by telephone for follow up visit in response to provider referral for pharmacy case management and/or care coordination services.   Consent to Services:  The patient was given the following information about Chronic Care Management services today, agreed to services, and gave verbal consent: 1. CCM service includes personalized support from designated clinical staff supervised by the primary care provider, including individualized plan of care and coordination with other care providers 2. 24/7 contact phone numbers for assistance for urgent and routine care needs. 3. Service will only be billed when office clinical staff spend 20 minutes or more in a month to coordinate care. 4. Only one practitioner may furnish and bill the service in a calendar month. 5.The patient may stop CCM services at any time (effective at the end of the month) by phone call to the office staff. 6. The patient will be responsible for cost sharing (co-pay) of up to 20% of the service fee (after annual deductible is met). Patient agreed to services and consent obtained.  Patient  Care Team: Marin Olp, MD as PCP - General (Family Medicine) Edythe Clarity, [facility] as Pharmacist (Pharmacist)  Recent office visits:  10/26/2021 OV (PCP) Marin Olp, MD; start back taking iron since not on now.   09/09/2021 OV (PCP) Marin Olp, MD; no medication changes indicated   Recent consult visits:  07/19/2021 OV (Rad Onc) Gery Pray, MD; no medication changes indicated.   Hospital visits:  None in previous 6 months   Objective:  Lab Results  Component Value Date   CREATININE 1.45 (H) 10/06/2022   BUN 16 10/06/2022   GFR 43.78 (L) 03/15/2022   GFRNONAA 48 (L) 10/06/2022   GFRAA >60 06/21/2017   NA 144 10/06/2022   K 4.1 10/06/2022   CALCIUM 8.4 (L) 10/06/2022   CO2 19 (L) 10/06/2022   GLUCOSE 99 10/06/2022    Lab Results  Component Value Date/Time   HGBA1C 6.1 09/09/2021 02:15 PM   GFR 43.78 (L) 03/15/2022 11:43 AM   GFR 43.55 (L) 10/26/2021 01:46 PM    Last diabetic Eye exam: No results found for: "HMDIABEYEEXA"  Last diabetic Foot exam: No results found for: "HMDIABFOOTEX"   Lab Results  Component Value Date   CHOL 119 09/09/2021   HDL 31.90 (L) 09/09/2021   LDLCALC 81 10/12/2020   LDLDIRECT 58.0 09/09/2021   TRIG 223.0 (H) 09/09/2021   CHOLHDL 4 09/09/2021       Latest Ref Rng & Units 03/15/2022   11:43 AM 10/26/2021    1:46 PM 09/09/2021    2:15 PM  Hepatic  Function  Total Protein 6.0 - 8.3 g/dL 6.5  6.9  6.5   Albumin 3.5 - 5.2 g/dL 3.7  3.7  3.6   AST 0 - 37 U/L _0 ALT 0 - 53 U/L _1 Alk Phosphatase 39 - 117 U/L 49  54  54   Total Bilirubin 0.2 - 1.2 mg/dL 0.3  0.3  0.3     Lab Results  Component Value Date/Time   TSH 1.00 06/18/2019 02:53 PM   TSH 0.62 08/01/2016 11:32 AM       Latest Ref Rng & Units 10/06/2022    1:43 PM 09/17/2022   12:23 PM 04/18/2022   10:28 AM  CBC  WBC 4.0 - 10.5 K/uL 8.1  7.9  7.5   Hemoglobin 13.0 - 17.0 g/dL 14.0  14.1  11.0   Hematocrit 39.0 - 52.0 % 45.3   43.7  35.2   Platelets 150 - 400 K/uL 208  181  203.0     No results found for: "VD25OH"  Clinical ASCVD: No  The ASCVD Risk score (Arnett DK, et al., 2019) failed to calculate for the following reasons:   The 2019 ASCVD risk score is only valid for ages 5 to 9       07/18/2022    3:25 PM 03/22/2021    3:26 PM 02/25/2021   10:11 AM  Depression screen PHQ 2/9  Decreased Interest 0 1 0  Down, Depressed, Hopeless 0 0 0  PHQ - 2 Score 0 1 0      Social History   Tobacco Use  Smoking Status Former   Packs/day: 0.50   Types: Cigarettes   Quit date: 12/26/1988   Years since quitting: 33.9  Smokeless Tobacco Never  Tobacco Comments   "I don't remember how long really" (02/08/2017)   BP Readings from Last 3 Encounters:  11/03/22 119/63  10/22/22 108/70  10/06/22 137/67   Pulse Readings from Last 3 Encounters:  11/03/22 64  10/22/22 65  10/06/22 (!) 59   Wt Readings from Last 3 Encounters:  11/03/22 224 lb 6 oz (101.8 kg)  10/06/22 223 lb 12.8 oz (101.5 kg)  10/03/22 223 lb 12.8 oz (101.5 kg)   BMI Readings from Last 3 Encounters:  11/03/22 32.19 kg/m  10/06/22 32.11 kg/m  10/03/22 32.11 kg/m    Assessment/Interventions: Review of patient past medical history, allergies, medications, health status, including review of consultants reports, laboratory and other test data, was performed as part of comprehensive evaluation and provision of chronic care management services.   SDOH:  (Social Determinants of Health) assessments and interventions performed: Yes Financial Resource Strain: Low Risk  (03/22/2021)   Overall Financial Resource Strain (CARDIA)    Difficulty of Paying Living Expenses: Not hard at all   Food Insecurity: No Food Insecurity (03/22/2021)   Hunger Vital Sign    Worried About Running Out of Food in the Last Year: Never true    Ran Out of Food in the Last Year: Never true    SDOH Interventions    [place] Office Visit from 09/12/2019 in [place]  SDOH Interventions   Depression Interventions/Treatment  Counseling      Financial Resource Strain: Low Risk  (03/22/2021)   Overall Financial Resource Strain (CARDIA)    Difficulty of Paying Living Expenses: Not hard at all    [place]: No Food Insecurity (03/22/2021)  Housing:  Low Risk  (03/22/2021)  Transportation Needs: No Transportation Needs (03/22/2021)  Depression (PHQ2-9): Low Risk  (07/18/2022)  Financial Resource Strain: Low Risk  (03/22/2021)  Physical Activity: Inactive (03/22/2021)  Social Connections: Socially Isolated (03/22/2021)  Stress: No Stress Concern Present (03/22/2021)  Tobacco Use: Medium Risk (11/03/2022)    [place]  Allergies  Allergen Reactions   Namenda [Memantine] Other (See Comments)    Made pt feel dizzy    Medications Reviewed Today     Reviewed by Edythe Clarity, [facility] (Pharmacist) on 12/13/22 at 0951  Med List Status: <None>   Medication Order Taking? Sig Documenting Provider Last Dose Status Informant  amLODipine-olmesartan (AZOR) 5-40 MG tablet [id number] Yes TAKE ONE TABLET BY MOUTH EVERY DAY Marin Olp, MD Taking Active   diclofenac Sodium (VOLTAREN) 1 % GEL [id number] Yes Apply 2 g topically 4 (four) times daily. Marin Olp, MD Taking Active   donepezil (ARICEPT) 10 MG tablet [id number] Yes TAKE ONE TABLET BY MOUTH EVERY DAY Marin Olp, MD Taking Active   ferrous sulfate 325 (65 FE) MG tablet [id number] Yes Take three times a week Marin Olp, MD Taking Active   Multiple Vitamin (MULTIVITAMIN) tablet [id number] Yes Take 1 tablet by mouth daily. Centrum silver [provider] Taking Active   rosuvastatin (CRESTOR) 20 MG tablet [id number] Yes TAKE ONE TABLET BY MOUTH EVERY DAY Marin Olp, MD Taking Active             Patient Active Problem List   Diagnosis Date Noted   Urinary incontinence 08/02/2022   Aortic atherosclerosis ([place])  01/06/2018   History of lung cancer 09/18/2017   Pulmonary emphysema ([place])    Parotiditis 02/08/2017   CKD (chronic kidney disease), stage III ([place]) 02/08/2017   Severe dementia ([place]) 07/17/2015   Hyperlipidemia 10/15/2014   Allergic rhinitis 10/15/2014   Overactive bladder 10/15/2014   CARDIAC MURMUR 01/10/2011   ORGANIC IMPOTENCE 08/12/2010   Gout of big toe 08/04/2008   Essential hypertension 07/25/2007   PROSTATE CANCER, HX OF 07/25/2007    Immunization History  Administered Date(s) Administered   Fluad Quad(high Dose 65+) 10/12/2020, 09/09/2021, 09/20/2022   Influenza Whole 11/06/2007, 10/07/2008, 12/09/2010   Influenza, High Dose Seasonal PF 08/22/2016, 11/07/2018   Influenza,inj,Quad PF,6+ Mos 10/15/2014   PFIZER(Purple Top)SARS-COV-2 Vaccination 05/07/2020, 05/27/2020   Pneumococcal Conjugate-13 05/08/2015   Pneumococcal Polysaccharide-23 11/06/2007   Td 12/26/2006, 05/03/2018    Conditions to be addressed/monitored:  HTN, Allergic Rhinitis, Dementia, HLD  Care Plan : General Pharmacy (Adult)  Updates made by Edythe Clarity, RPH since 12/13/2022 12:00 AM     Problem: HTN, Allergic Rhinitis, Dementia, HLD   Priority: High  Onset Date: 11/15/2021     Long-Range Goal: Patient-Specific Goal   Start Date: 11/15/2021  Expected End Date: 05/15/2022  Recent Progress: On track  Priority: High  Note:   Current Barriers:  Memory/aggression towards caregivers  Pharmacist Clinical Goal(s):  Patient will maintain control of BP/cholesterol as evidenced by labs/monitoring  through collaboration with PharmD and provider.   Interventions: 1:1 collaboration with Marin Olp, MD regarding development and update of comprehensive plan of care as evidenced by provider attestation and co-signature Inter-disciplinary care team collaboration (see longitudinal plan of care) Comprehensive medication review performed; medication list updated in electronic medical  record  Hypertension (BP goal <140/90) -Controlled -Current treatment: Amlodipine-olmesartan 5-62m daily -Medications previously tried: lisinopril -Current home readings: not checking -Current dietary habits: eats breakfast and lunch at adult  daycare, dinner is home cooked meal with meat/veggies/starch -Current exercise habits: minimal, chair exercises at adult daycare -Denies hypotensive/hypertensive symptoms -Educated on BP goals and benefits of medications for prevention of heart attack, stroke and kidney damage; Symptoms of hypotension and importance of maintaining adequate hydration; -Counseled to monitor BP at home if able once weekly, document, and provide log at future appointments -Recommended to continue current medication  Hyperlipidemia: (LDL goal < 100) 12/13/22 -Controlled -Current treatment: Rosuvastatin 51m daily Appropriate, Effective, Safe, Accessible -Medications previously tried: atorvastatin  -Current dietary patterns: see HTN -Current exercise habits: see HTN -Educated on Cholesterol goals;  Benefits of statin for ASCVD risk reduction; Most recent LDL controlled. Need to continue to monitor kidney function but ok for now.  No need to switch. Would drop Crestor to 140mif GFR ever drops below 30.  Update 05/31/22 Continues Crestor with no concerns LDL is controlled. Continue as current and recommend routine lipid panel  yearly. Continue to monitor GFR for dose adjustments.   Dementia (Goal: Prevent progression) 12/13/22 -Controlled -Current treatment  Donepezil 1037maily Appropriate, Effective, Safe, Accessible -Medications previously tried: memantine (dizziness) -Takes med with no adverse effects, denies any GI symptoms  -Recommended to continue current medication Now using My Pharmacy and medication delivery and administration is going well.  Sister Jerry Congoys he is adherent with medications.  Still having confusion daily but is under 24 hour  care.  Sisters plan to discuss the future of his care over the holidays.  They are considering either some form of care for a few hours in the home or possibly nursing home.  Jerry Congoefers to keep him in his own space if possible. Did discuss possible referral to social worker and they are interested. No changes to medications. CMA to check on safety in 2 months.  Update 05/31/22 Was not able to get him set up with delivery due to his locations and distance from  normal delivery routes. Currently, he is staying with his daughter at her house while his house is undergoing repairs.  [facility].  He is there for the majority of the day.  Daughter reports some aggression towards her and some wandering out of the house.  He is asking her repeated questions. She does not fear for her safety, however, mentions the difficulty in being is full time caregiver. Unsure of their future plans for living situation. Advised her if it gets worse or aggression increases to please come in to see Dr. HunYong Channelo changes to meds at this time.  Patient Goals/Self-Care Activities Patient will:  - take medications as prescribed as evidenced by patient report and record review focus on medication adherence by pill packs check blood pressure periodically, document, and provide at future appointments  Follow Up Plan: The care management team will reach out to the patient again over the next 180 days.             Medication Assistance: None required.  Patient affirms current coverage meets needs.  Compliance/Adherence/Medication fill history: Care Gaps: AWV  Star-Rating Drugs: Rosuvastatin 11/23/22 90ds  Patient's preferred pharmacy is:  [id number]: [phone]: [phone] pill box? Yes Pt endorses 100% compliance  We discussed: Benefits of  medication synchronization, packaging and delivery as well as enhanced pharmacist oversight with [facility]. Patient decided to: Utilize [facility] pharmacy for medication  synchronization, packaging and delivery Verbal consent obtained for [facility] enhanced pharmacy services (medication synchronization, adherence packaging, delivery coordination). A medication sync plan was created to allow patient to get all medications delivered once every 30 to 90 days per patient preference. Patient understands they have freedom to choose pharmacy and clinical pharmacist will coordinate care between all prescribers and [facility].  Care Plan and Follow Up Patient Decision:  Patient agrees to Care Plan and Follow-up.  Plan: The care management team will reach out to the patient again over the next 180 days.  Beverly Milch, PharmD Clinical Pharmacist  [facility] [phone]

## 2022-12-13 ENCOUNTER — Ambulatory Visit: Payer: Medicare Other | Admitting: Pharmacist

## 2022-12-13 DIAGNOSIS — E785 Hyperlipidemia, unspecified: Secondary | ICD-10-CM

## 2022-12-13 DIAGNOSIS — F03C0 Unspecified dementia, severe, without behavioral disturbance, psychotic disturbance, mood disturbance, and anxiety: Secondary | ICD-10-CM

## 2022-12-13 NOTE — Patient Instructions (Addendum)
Visit Information   Goals Addressed             This Visit's Progress    Manage My Medicine   On track    Timeframe:  Long-Range Goal Priority:  High Start Date:  11/15/21                           Expected End Date:  05/15/22                     Follow Up Date 02/15/22    - keep a list of all the medicines I take; vitamins and herbals too  -Maintain adherence using [facility] packaging   Why is this important?   These steps will help you keep on track with your medicines.   Notes:        Patient Care Plan: General Pharmacy (Adult)     Problem Identified: HTN, Allergic Rhinitis, Dementia, HLD   Priority: High  Onset Date: 11/15/2021     Long-Range Goal: Patient-Specific Goal   Start Date: 11/15/2021  Expected End Date: 05/15/2022  Recent Progress: On track  Priority: High  Note:   Current Barriers:  Memory/aggression towards caregivers  Pharmacist Clinical Goal(s):  Patient will maintain control of BP/cholesterol as evidenced by labs/monitoring  through collaboration with PharmD and provider.   Interventions: 1:1 collaboration with Marin Olp, MD regarding development and update of comprehensive plan of care as evidenced by provider attestation and co-signature Inter-disciplinary care team collaboration (see longitudinal plan of care) Comprehensive medication review performed; medication list updated in electronic medical record  Hypertension (BP goal <140/90) -Controlled -Current treatment: Amlodipine-olmesartan 5-40mg  daily -Medications previously tried: lisinopril -Current home readings: not checking -Current dietary habits: eats breakfast and lunch at adult daycare, dinner is home cooked meal with meat/veggies/starch -Current exercise habits: minimal, chair exercises at adult daycare -Denies hypotensive/hypertensive symptoms -Educated on BP goals and benefits of medications for prevention of heart attack, stroke and kidney damage; Symptoms of  hypotension and importance of maintaining adequate hydration; -Counseled to monitor BP at home if able once weekly, document, and provide log at future appointments -Recommended to continue current medication  Hyperlipidemia: (LDL goal < 100) 12/13/22 -Controlled -Current treatment: Rosuvastatin 20mg  daily Appropriate, Effective, Safe, Accessible -Medications previously tried: atorvastatin  -Current dietary patterns: see HTN -Current exercise habits: see HTN -Educated on Cholesterol goals;  Benefits of statin for ASCVD risk reduction; Most recent LDL controlled. Need to continue to monitor kidney function but ok for now.  No need to switch. Would drop Crestor to 10mg  if GFR ever drops below 30.  Update 05/31/22 Continues Crestor with no concerns LDL is controlled. Continue as current and recommend routine lipid panel  yearly. Continue to monitor GFR for dose adjustments.   Dementia (Goal: Prevent progression) 12/13/22 -Controlled -Current treatment  Donepezil 10mg  daily Appropriate, Effective, Safe, Accessible -Medications previously tried: memantine (dizziness) -Takes med with no adverse effects, denies any GI symptoms  -Recommended to continue current medication Now using My Pharmacy and medication delivery and administration is going well.  Sister [name] says he is adherent with medications.  Still having confusion daily but is under 24 hour care.  Sisters plan to discuss the future of his care over the holidays.  They are considering either some form of care for a few hours in the home or possibly nursing home.  [name] prefers to keep him in his own space if possible. Did discuss  possible referral to Education officer, museum and they are interested. No changes to medications. CMA to check on safety in 2 months.  Update 05/31/22 Was not able to get him set up with delivery due to his locations and distance from  normal delivery routes. Currently, he is staying with his daughter at her  house while his house is undergoing repairs.  Still in [facility] care at [facility] during the week.  He is there for the majority of the day.  Daughter reports some aggression towards her and some wandering out of the house.  He is asking her repeated questions. She does not fear for her safety, however, mentions the difficulty in being is full time caregiver. Unsure of their future plans for living situation. Advised her if it gets worse or aggression increases to please come in to see Dr. Yong Channel. No changes to meds at this time.  Patient Goals/Self-Care Activities Patient will:  - take medications as prescribed as evidenced by patient report and record review focus on medication adherence by pill packs check blood pressure periodically, document, and provide at future appointments  Follow Up Plan: The care management team will reach out to the patient again over the next 180 days.           The patient verbalized understanding of instructions, educational materials, and care plan provided today and DECLINED offer to receive copy of patient instructions, educational materials, and care plan.  Telephone follow up appointment with pharmacy team member scheduled for: 6 months  Edythe Clarity, Ruth, PharmD Clinical Pharmacist  [facility] [phone]

## 2022-12-14 ENCOUNTER — Telehealth: Payer: Self-pay | Admitting: *Deleted

## 2022-12-14 NOTE — Addendum Note (Signed)
Addended by: Edythe Clarity on: 12/14/2022 08:55 AM   Modules accepted: Orders

## 2022-12-14 NOTE — Progress Notes (Signed)
  Care Coordination  Outreach Note  12/14/2022 Name: DESMAN POLAK MRN: [id number] DOB: Sep 11, 1936   Care Coordination Outreach Attempts: An unsuccessful telephone outreach was attempted today to offer the patient information about available care coordination services as a benefit of their health plan.   Referral received   Follow Up Plan:  Additional outreach attempts will be made to offer the patient care coordination information and services.   Encounter Outcome:  No Answer  Julian Hy, [place] Direct Dial: [phone]

## 2022-12-20 ENCOUNTER — Other Ambulatory Visit: Payer: Self-pay | Admitting: Family Medicine

## 2022-12-22 ENCOUNTER — Telehealth: Payer: Self-pay | Admitting: Family Medicine

## 2022-12-22 NOTE — Telephone Encounter (Signed)
Can pt see another provider for this or does it have to be with PCP?

## 2022-12-22 NOTE — Telephone Encounter (Signed)
Caller states: -Patient needs to have Long term FL2 form completed to lock in a lease for new facility  - PCP is aware of patient's condition  - Prices in facility will go up on 12/26/22 - She was unable to do this due to being out of town   Caller requests: -Confirmation on if patient will need an OV to complete FL2 form or if PCP can just fill it out   Caller states she can bring in paperwork for completion if needed. I informed caller that PCP is out of office. Caller verbalized understanding but would like to know if anything could be done. Please Advise.

## 2022-12-22 NOTE — Progress Notes (Signed)
  Care Coordination   Note   12/22/2022 Name: Jerry [name] MRN: [id number] DOB: May 15, 1936  [name] is a 86 y.o. year old male who sees Hunter, Brayton Mars, MD for primary care. I reached out to [name] by phone today to offer care coordination services.  [name] was given information about Care Coordination services today including:   The Care Coordination services include support from the care team which includes your Nurse Coordinator, Clinical Social Worker, or Pharmacist.  The Care Coordination team is here to help remove barriers to the health concerns and goals most important to you. Care Coordination services are voluntary, and the patient may decline or stop services at any time by request to their care team member.   Care Coordination Consent Status: Patient agreed to services and verbal consent obtained.   Follow up plan:  Telephone appointment with care coordination team member scheduled for:  12/27/2022  Encounter Outcome:  Pt. Scheduled from referral   Julian Hy, [place] Direct Dial: [phone]

## 2022-12-22 NOTE — Telephone Encounter (Signed)
Please schedule OV for this.

## 2022-12-22 NOTE — Telephone Encounter (Signed)
Pt is scheduled to see Hunter next week. We no available appts this week.

## 2022-12-22 NOTE — Telephone Encounter (Signed)
Another provider is fine.

## 2022-12-27 ENCOUNTER — Ambulatory Visit: Payer: Self-pay | Admitting: Licensed Clinical Social Worker

## 2022-12-27 NOTE — Patient Outreach (Signed)
  Care Coordination   Initial Visit Note   12/27/2022 Name: Jerry [name] MRN: [id number] DOB: 03/07/1936  Jerry [name] is a 87 y.o. year old male who sees Hunter, Brayton Mars, MD for primary care. I spoke with  Jerry [name]'s daughter, [name], by phone today.  What matters to the patients health and wellness today?  Assisted Living Placement/Caregiver Fatigue    Goals Addressed             This Visit's Progress    Caregiver Fatigue-LCSW   On track    Care Coordination Interventions: Solution-Focused Strategies employed:  Active listening / Reflection utilized  Emotional Support Provided Provided psychoeducation for mental health needs  Caregiver stress acknowledged  Verbalization of feelings encouraged  Discussed caregiver resources and support:   LCSW spoke with pt's daughter, [name], who has been providing care for pt since 2021 Patient participated in [facility] Pt's daughter are in process of having him placed in Assisted Living. Pt is scheduled to move on Thursday, Jan 4th to [place] provided a safe environment for family to discuss concerns and anxiety about upcoming change. Validation and encouragement provided LCSW discussed strategies to disclose new residence to patient, if family chooses to address tomorrow during lunch visit Ms. [name] receives strong support from family, who resides locally and out of state LCSW strongly encouraged family to contact her with any questions or concerns that may arise in placement           SDOH assessments and interventions completed:  No     Care Coordination Interventions:  Yes, provided   Follow up plan: Follow up call scheduled for 2 weeks    Encounter Outcome:  Pt. Visit Completed   Christa See, MSW, [email or web address] Phone [phone] 6:06 PM

## 2022-12-27 NOTE — Patient Instructions (Signed)
Visit Information  Thank you for taking time to visit with me today. Please don't hesitate to contact me if I can be of assistance to you.   Following are the goals we discussed today:   Goals Addressed             This Visit's Progress    Caregiver Fatigue-LCSW   On track    Care Coordination Interventions: Solution-Focused Strategies employed:  Active listening / Reflection utilized  Emotional Support Provided Provided psychoeducation for mental health needs  Caregiver stress acknowledged  Verbalization of feelings encouraged  Discussed caregiver resources and support:   LCSW spoke with pt's daughter, Jerry [name], who has been providing care for pt since 2021 Patient participated in [facility] Pt's daughter are in process of having him placed in Assisted Living. Pt is scheduled to move on Thursday, Jan 4th to [place] provided a safe environment for family to discuss concerns and anxiety about upcoming change. Validation and encouragement provided LCSW discussed strategies to disclose new residence to patient, if family chooses to address tomorrow during lunch visit Ms. Jerry [name] receives strong support from family, who resides locally and out of state LCSW strongly encouraged family to contact her with any questions or concerns that may arise in placement           Our next appointment is by telephone on 01/10/23 at 1 PM  Please call the care guide team at [phone] if you need to cancel or reschedule your appointment.   If you are experiencing a Mental Health or [place] or need someone to talk to, please call the Suicide and Crisis Lifeline: 988 call 911   Patient verbalizes understanding of instructions and care plan provided today and agrees to view in [place]. Active MyChart status and patient understanding of how to access instructions and care plan via MyChart confirmed with patient.     Christa See, MSW, [email or web address] Phone [phone] 6:07 PM

## 2022-12-29 ENCOUNTER — Encounter: Payer: Self-pay | Admitting: Family Medicine

## 2022-12-29 ENCOUNTER — Ambulatory Visit (INDEPENDENT_AMBULATORY_CARE_PROVIDER_SITE_OTHER): Payer: Medicare Other | Admitting: Family Medicine

## 2022-12-29 VITALS — BP 118/60 | HR 57 | Temp 97.6°F | Ht 70.0 in | Wt 223.6 lb

## 2022-12-29 DIAGNOSIS — Z201 Contact with and (suspected) exposure to tuberculosis: Secondary | ICD-10-CM | POA: Diagnosis not present

## 2022-12-29 DIAGNOSIS — F03C0 Unspecified dementia, severe, without behavioral disturbance, psychotic disturbance, mood disturbance, and anxiety: Secondary | ICD-10-CM | POA: Diagnosis not present

## 2022-12-29 DIAGNOSIS — E785 Hyperlipidemia, unspecified: Secondary | ICD-10-CM

## 2022-12-29 DIAGNOSIS — I10 Essential (primary) hypertension: Secondary | ICD-10-CM

## 2022-12-29 LAB — LIPID PANEL
Cholesterol: 131 mg/dL (ref 0–200)
HDL: 33.1 mg/dL — ABNORMAL LOW (ref >39.00)
LDL Cholesterol: 74 mg/dL (ref 0–99)
NonHDL: 98.09
Total CHOL/HDL Ratio: 4
Triglycerides: 119 mg/dL (ref 0.0–149.0)
VLDL: 23.8 mg/dL (ref 0.0–40.0)

## 2022-12-29 LAB — COMPREHENSIVE METABOLIC PANEL
ALT: 15 U/L (ref 0–53)
AST: 14 U/L (ref 0–37)
Albumin: 3.7 g/dL (ref 3.5–5.2)
Alkaline Phosphatase: 57 U/L (ref 39–117)
BUN: 17 mg/dL (ref 6–23)
CO2: 28 mEq/L (ref 19–32)
Calcium: 8.9 mg/dL (ref 8.4–10.5)
Chloride: 111 mEq/L (ref 96–112)
Creatinine, Ser: 1.42 mg/dL (ref 0.40–1.50)
GFR: 45.39 mL/min — ABNORMAL LOW (ref >60.00)
Glucose, Bld: 83 mg/dL (ref 70–99)
Potassium: 4.6 mEq/L (ref 3.5–5.1)
Sodium: 143 mEq/L (ref 135–145)
Total Bilirubin: 0.4 mg/dL (ref 0.2–1.2)
Total Protein: 6.4 g/dL (ref 6.0–8.3)

## 2022-12-29 NOTE — Progress Notes (Signed)
Phone [phone] In person visit   Subjective:   Jerry [name] is a 87 y.o. year old very pleasant male patient who presents for/with See problem oriented charting Chief Complaint  Patient presents with   FL2    Pt is here to have FL2 form filled out.   Past Medical History-  Patient Active Problem List   Diagnosis Date Noted   History of lung cancer 09/18/2017    Priority: High   Severe dementia ([place]) 07/17/2015    Priority: High   CKD (chronic kidney disease), stage III ([place]) 02/08/2017    Priority: Medium    Hyperlipidemia 10/15/2014    Priority: Medium    Gout of big toe 08/04/2008    Priority: Medium    Essential hypertension 07/25/2007    Priority: Medium    PROSTATE CANCER, HX OF 07/25/2007    Priority: Medium    Aortic atherosclerosis ([place]) 01/06/2018    Priority: Low   Pulmonary emphysema ([place])     Priority: Low   Parotiditis 02/08/2017    Priority: Low   Allergic rhinitis 10/15/2014    Priority: Low   Overactive bladder 10/15/2014    Priority: Low   CARDIAC MURMUR 01/10/2011    Priority: Low   ORGANIC IMPOTENCE 08/12/2010    Priority: Low   Urinary incontinence 08/02/2022    Medications- reviewed and updated Current Outpatient Medications  Medication Sig Dispense Refill   amLODipine-olmesartan (AZOR) 5-40 MG tablet TAKE ONE TABLET BY MOUTH EVERY DAY 90 tablet PRN   diclofenac Sodium (VOLTAREN) 1 % GEL Apply 2 g topically 4 (four) times daily. 100 g 3   donepezil (ARICEPT) 10 MG tablet TAKE ONE TABLET BY MOUTH EVERY DAY 90 tablet PRN   ferrous sulfate 325 (65 FE) MG tablet Take three times a week 40 tablet 3   Multiple Vitamin (MULTIVITAMIN) tablet Take 1 tablet by mouth daily. Centrum silver     rosuvastatin (CRESTOR) 20 MG tablet TAKE ONE TABLET BY MOUTH EVERY DAY 90 tablet PRN   No current facility-administered medications for this visit.     Objective:  BP 118/60   Pulse (!) 57   Temp 97.6 F (36.4 C)   Ht 5\' 10"  (1.778 m)   Wt 223  lb 9.6 oz (101.4 kg)   SpO2 97%   BMI 32.08 kg/m  Gen: NAD, resting comfortably CV: RRR no murmurs rubs or gallops (not bradycardic on my exam) Lungs: CTAB no crackles, wheeze, rhonchi Ext: no edema Skin: warm, dry Neuro: looks to daughter for many questions    Assessment and Plan   #Dementia S: Medication: Donepezil 10 mg -Did not tolerate Namenda due to dizziness -Most recent neuroimaging 03/25/2021 head CT without contrast due to delirium-no acute intracranial pathology but did have some small vessel white matter disease and prior to that 07/13/2015 MRI of the brain-no obvious cause for memory lossNeurology: Previously seen by Dr. Jerene Pitch visit in 2017  - patient moving to [place] house soon- filling out FL2 today -dementia with mild gradual progressoin A/P: dementia stable- continue current medicines   -needs Quantiferon gold testing for moving into assisted living   #hypertension S: medication: Amlodipine-olmesartan 5-40 mg BP Readings from Last 3 Encounters:  12/29/22 118/60  11/03/22 119/63  10/22/22 108/70  A/P: stable- continue current medicines  #hyperlipidemia #Aortic atherosclerosis S: Medication: Rosuvastatin 20 mg Lab Results  Component Value Date   CHOL 119 09/09/2021   HDL 31.90 (L) 09/09/2021   LDLCALC 81 10/12/2020   LDLDIRECT  58.0 09/09/2021   TRIG 223.0 (H) 09/09/2021   CHOLHDL 4 09/09/2021   A/P: excellent control last check but due for lipids- update with labs  #Emphysema-incidental finding on imaging S: Medication: None A/P: remains asymptomatic- continue to monitor   #Gout history- has done well lately  Recommended follow up: Return in about 6 months (around 06/29/2023) for physical or sooner if needed.Schedule b4 you leave. Future Appointments  Date Time Provider [place]  01/10/2023  1:00 PM Rebekah [place]field, LCSW [facility] None  02/13/2023  3:00 PM Gery Pray, MD [facility] None  06/19/2023  2:00 PM [facility] CCM PHARMACIST  [facility] PEC    Lab/Order associations:   ICD-10-CM   1. Severe dementia without behavioral disturbance, psychotic disturbance, mood disturbance, or anxiety, unspecified dementia type ([place])  F03.C0     2. Hyperlipidemia, unspecified hyperlipidemia type  E78.5 Comprehensive metabolic panel    Lipid panel    3. Essential hypertension  I10     4. Contact with and (suspected) exposure to tuberculosis  Z20.1 QuantiFERON-TB Gold Plus    CANCELED: QuantiFERON-TB Gold Plus      Return precautions advised.  Garret Reddish, MD

## 2022-12-29 NOTE — Patient Instructions (Addendum)
Please stop by lab before you go If you have mychart- we will send your results within 3 business days of [place] receiving them.  If you do not have mychart- we will call you about results within 5 business days of [place] receiving them.  *please also note that you will see labs on mychart as soon as they post. I will later go in and write notes on them- will say "notes from Dr. Yong Channel"   Hoping to have TB test back by Monday- pending blood test  Recommended follow up: Return in about 6 months (around 06/29/2023) for physical or sooner if needed.Schedule b4 you leave.

## 2022-12-30 ENCOUNTER — Telehealth: Payer: Self-pay | Admitting: Family Medicine

## 2022-12-30 NOTE — Telephone Encounter (Signed)
Patient's daughter Cristopher Estimable requests to be called at ph# [phone]  regarding Patient's medications/supplements.  States Patient has moved to [facility] who will administering Patient's medications and supplements.  Requests to know if it is okay for patient's iron supplement to be changed from 325 mg 3 x per week to 27 mg daily.  Requests clarification on Multivitamin MG  [name] states she would like to be called/informed asap as she is leaving town later today.  Parks Ranger- is the Resident Care Coordinator Fax# [phone]

## 2022-12-30 NOTE — Telephone Encounter (Signed)
Caller states: - She just realized that if iron were to be changed to 27 mg, patient would have to take a lot of pills daily-- Would like to keep iron dosage the same   Caller requests: -Clarification of Multivitamin MG be faxed to Parks Ranger, [place] Coordinator @ [phone]. She can be reached by phone directly at [phone].

## 2022-12-30 NOTE — Telephone Encounter (Signed)
Please see call note and advise

## 2022-12-31 LAB — QUANTIFERON-TB GOLD PLUS
Mitogen-NIL: >10 IU/mL
NIL: 0.03 IU/mL
QuantiFERON-TB Gold Plus: NEGATIVE
TB1-NIL: 0 IU/mL
TB2-NIL: <0 IU/mL

## 2022-12-31 NOTE — Telephone Encounter (Signed)
General adult multivitamin- fine to keep iron the same

## 2023-01-02 ENCOUNTER — Telehealth: Payer: Self-pay | Admitting: Family Medicine

## 2023-01-02 NOTE — Telephone Encounter (Signed)
Patient had a TB test at [facility] - would like a copy via fax [phone] att: Parks Ranger.   Fl2 forms received - awaiting documentation and green sheet from [employer]

## 2023-01-02 NOTE — Telephone Encounter (Signed)
Noted  

## 2023-01-02 NOTE — Telephone Encounter (Signed)
Results printed and faxed to [facility].

## 2023-01-02 NOTE — Telephone Encounter (Signed)
Caller states: - She has faxed over patient's FL2 paperwork which includes new care plan   Caller requests: -PCP look over plan and complete directions listed on paperwork

## 2023-01-03 NOTE — Telephone Encounter (Signed)
Called and lm on [name] vm with below message.

## 2023-01-04 ENCOUNTER — Telehealth: Payer: Self-pay | Admitting: Family Medicine

## 2023-01-04 MED ORDER — BUSPIRONE HCL 5 MG PO TABS: 5.0000 mg | ORAL_TABLET | Freq: Two times a day (BID) | ORAL | 5 refills | Status: DC

## 2023-01-04 NOTE — Telephone Encounter (Signed)
See below

## 2023-01-04 NOTE — Telephone Encounter (Signed)
Caller states: - Patient has been experiencing agitation particularly at night between 7-8pm.  - Claims he is being held hostage at times   Caller requests:  - Patient be prescribed something that could calm him down at night   Rx order can be faxed to Parks Ranger at [phone]

## 2023-01-04 NOTE — Telephone Encounter (Signed)
I sent in buspirone 5 mg-please have him take this twice daily and schedule follow-up if not effective.  I think it may give him more benefit if he keeps it in his system by taking twice daily instead of just in the evening but if they prefer they can also just try in the evening

## 2023-01-05 NOTE — Telephone Encounter (Signed)
Order has been faxed to [place] at [facility].

## 2023-01-06 NOTE — Telephone Encounter (Signed)
.  Type of form received: FL2 Form  Additional comments:   Received by: Lorene Dy  Form should be Faxed to:  Form should be mailed to:    Is patient requesting call for pickup:   Form placed:  In provider's box  Attach charge sheet. yes  Individual made aware of 3-5 business day turn around (Y/N)?

## 2023-01-09 NOTE — Telephone Encounter (Signed)
Noted  

## 2023-01-10 ENCOUNTER — Ambulatory Visit: Payer: Self-pay | Admitting: Licensed Clinical Social Worker

## 2023-01-10 NOTE — Telephone Encounter (Signed)
Caller states:  -PCP signed everything except care plan  - She will re-fax with place PCP needs to sign highlighted  Caller requests: -This be completed and sent back as soon as possible

## 2023-01-10 NOTE — Telephone Encounter (Signed)
Noted  

## 2023-01-12 ENCOUNTER — Telehealth: Payer: Self-pay | Admitting: Family Medicine

## 2023-01-12 NOTE — Telephone Encounter (Signed)
He can trial melatonin 1 mg nightly 30 minutes before bed for 1 week to "reset" sleep clock- since OTC not sure if they need an order sheet for this- please ask them

## 2023-01-12 NOTE — Telephone Encounter (Signed)
See below.

## 2023-01-12 NOTE — Telephone Encounter (Signed)
Jerry [name] with [facility] requests a medication that will help patient sleep. States Patient is not sleeping at night. States possibly Melatonin or something that will work better.

## 2023-01-13 NOTE — Patient Instructions (Signed)
Visit Information  Thank you for taking time to visit with me today. Please don't hesitate to contact me if I can be of assistance to you.   Following are the goals we discussed today:   Goals Addressed             This Visit's Progress    Caregiver Fatigue-LCSW   On track    Care Coordination Interventions: Solution-Focused Strategies employed:  Active listening / Reflection utilized  Emotional Support Provided Provided psychoeducation for mental health needs  Caregiver stress acknowledged  Verbalization of feelings encouraged  Discussed caregiver resources and support:   LCSW spoke with Malachi Bonds, pt's daughter Pt has been placed at [facility] for Assisted Living Family has attempted to visit approx twice weekly to allow time for pt to acclimate to new residence Pt's daughter discussed feelings associated with placement. Validation and encouragement provided Strategies to cope with strong emotions discussed           Our next appointment is by telephone on 01/31/23 at 1 PM  Please call the care guide team at [phone] if you need to cancel or reschedule your appointment.   If you are experiencing a Mental Health or Behavioral Health Crisis or need someone to talk to, please call the Suicide and Crisis Lifeline: 988 call 911   Patient verbalizes understanding of instructions and care plan provided today and agrees to view in MyChart. Active MyChart status and patient understanding of how to access instructions and care plan via MyChart confirmed with patient.     Jenel Lucks, MSW, LCSW [facility] Care Management [place]  [facility] [email or web address] Phone [phone] 5:15 PM

## 2023-01-13 NOTE — Patient Outreach (Signed)
  Care Coordination Late Entry  Follow Up Visit Note   Outreach completed 01/10/23 Name: FRED FRANZEN MRN: [id number] DOB: 12/22/1936  Debbrah Alar Olafson is a 87 y.o. year old male who sees Hunter, Aldine Contes, MD for primary care. I spoke with  Uzoma L Fann's daughter, Malachi Bonds by phone today.  What matters to the patients health and wellness today?  Caregiver Strain/Placement    Goals Addressed             This Visit's Progress    Caregiver Fatigue-LCSW   On track    Care Coordination Interventions: Solution-Focused Strategies employed:  Active listening / Reflection utilized  Emotional Support Provided Provided psychoeducation for mental health needs  Caregiver stress acknowledged  Verbalization of feelings encouraged  Discussed caregiver resources and support:   LCSW spoke with Malachi Bonds, pt's daughter Pt has been placed at [facility] for Assisted Living Family has attempted to visit approx twice weekly to allow time for pt to acclimate to new residence Pt's daughter discussed feelings associated with placement. Validation and encouragement provided Strategies to cope with strong emotions discussed           SDOH assessments and interventions completed:  No     Care Coordination Interventions:  Yes, provided   Follow up plan: Follow up call scheduled for 4 weeks    Encounter Outcome:  Pt. Visit Completed   Jenel Lucks, MSW, LCSW [facility] Care Management [facility]  [facility] [email or web address] Phone [phone] 5:15 PM

## 2023-01-16 NOTE — Telephone Encounter (Signed)
Called and spoke with Porfirio Mylar and Rx has been written and faxed.

## 2023-01-17 ENCOUNTER — Telehealth: Payer: Self-pay | Admitting: Family Medicine

## 2023-01-17 NOTE — Telephone Encounter (Signed)
FYI

## 2023-01-17 NOTE — Telephone Encounter (Signed)
Take 1 week off then trial 3 mg dose of melatonin for 1 week nightly 30 minutes before bed. Can write rx. Before we make further adjustments would want family impression of situation- we do not want to simply sedate him-its possible he was up and moving at home as well during the night- as long as he is not placing himself at risk- not sure we want to get more aggressive with meds

## 2023-01-17 NOTE — Telephone Encounter (Signed)
[name] from [employer] states the meds that were RX for agitation and insomnia is not working for the patient. Please advise and call the office back.

## 2023-01-18 NOTE — Telephone Encounter (Signed)
Jerry [name] called again and would like a call back.

## 2023-01-19 ENCOUNTER — Telehealth: Payer: Self-pay | Admitting: *Deleted

## 2023-01-19 NOTE — Telephone Encounter (Signed)
CALLED [facility] - PH. - [phone] AND LVM WITH SUSAN REGARDING CT FOR 02-10-23- ARRIVAL TIME- 2:15 PM @ [facility], NO RESTRICTIONS TO TEST, PATIENT TO RECEIVE RESULTS FROM DR. KINARD ON 02-13-23 @ 3 PM, LVM FOR A RETURN CALL

## 2023-01-19 NOTE — Telephone Encounter (Signed)
Called and spoke with pt daughter and approval given for me to fax the order for Melatonin 3mg . Order has been faxed to [place] at [facility].

## 2023-01-19 NOTE — Telephone Encounter (Signed)
Parks Ranger called back for an update on possibly sending in another medication that could help patient sleep at night.   Jerry [name] requests to be called at personal number, [phone].

## 2023-01-19 NOTE — Telephone Encounter (Signed)
Called and spoke with pt daughter Cristopher Estimable regarding the below, Gelcia sates she was not aware that [facility] had been reaching out to [place] regarding this/medications. They are not supposed to contact us prior to speaking with her first. Cristopher Estimable states she will contact Asencion Partridge to see what is going on, she appreciated [place] not sending in anything else before speaking with the family. Cristopher Estimable will cb later with an update, I told [name] that I will not call Asencion Partridge back until I have heard from her later.

## 2023-01-19 NOTE — Telephone Encounter (Signed)
Caller states: -She spoke to director and told them to keep her in the loop in regards to any medication changes, such as dosage or brand.  - Currently approves the increase they requested after being told they would monitor him closely   Caller requests: -A callback from [facility] in regards to how much the increase is. She is free after 2:30pm or a VM can be left

## 2023-01-26 ENCOUNTER — Telehealth: Payer: Self-pay | Admitting: Family Medicine

## 2023-01-26 NOTE — Telephone Encounter (Signed)
Pts sister would like a call back about her fathers meds. Please advise

## 2023-01-27 NOTE — Telephone Encounter (Signed)
Called and spoke with Jerry [name] and she states [facility] is still saying that the melatonin is not working at the 3mg  dose, pt is still wandering into rooms and up at night. Jerry [name] wants to know is Melatonin a sliding scale? At what dose is to much that would sedate him (which is not the goal) and what would be the next course. I informed Jerry [name] that I would route this message to you and reach back out to her on Monday before calling [place] with answer/next steps.

## 2023-01-27 NOTE — Telephone Encounter (Signed)
See below regarding buspar and melatonin question before calling daughter back.

## 2023-01-27 NOTE — Telephone Encounter (Signed)
If 3 mg is not effective I doubt other doses will be-1 to 3 mg is typically the most effective dose.  You can stop melatonin.  Trial trazodone 25 mg (half tablet of 50 mg) nightly before bed-this is to hopefully help him sleep-if not effective we can then try 50 mg and if not effective can refer to geriatric psychiatry as other options would really be daily antidepressants and/or antipsychotics

## 2023-01-27 NOTE — Telephone Encounter (Signed)
Patient's daughter [name] requests to be called at ph# [phone] (requests 3 way call with sister Cristopher Estimable) to be advised if Patient should continue taking Buspar while also taking Melatonin.  States she wants to make sure that Dr. Yong Channel advised  Patient should take both Buspar and Melatonin or should Patient stop taking Buspar and take only Melatonin.

## 2023-01-27 NOTE — Telephone Encounter (Signed)
He is on such a low-dose of both that I believe it is reasonable for him to continue current medication with melatonin and buspirone together

## 2023-01-30 NOTE — Telephone Encounter (Signed)
Called and lm for [employer].

## 2023-01-30 NOTE — Telephone Encounter (Signed)
Jerry [name] returned call and will speak with her sister [name] about the below message and will call us back later in the week on their decision.

## 2023-01-31 ENCOUNTER — Ambulatory Visit: Payer: Self-pay | Admitting: Licensed Clinical Social Worker

## 2023-01-31 NOTE — Telephone Encounter (Signed)
Caller states she would like to know if patient would be taking the buspar plus the trazodone or just the trazodone by itself. Requests a callback from [place].

## 2023-01-31 NOTE — Telephone Encounter (Signed)
See below

## 2023-01-31 NOTE — Telephone Encounter (Signed)
We can add the trazodone in addition to the buspirone.  If they strongly prefer and he is doing well in the daytime I could also try to wean him off the buspirone

## 2023-02-01 NOTE — Patient Instructions (Signed)
Visit Information  Thank you for taking time to visit with me today. Please don't hesitate to contact me if I can be of assistance to you.   Following are the goals we discussed today:   Goals Addressed             This Visit's Progress    COMPLETED: Caregiver Fatigue-LCSW   On track    Care Coordination Interventions: Solution-Focused Strategies employed:  Active listening / Reflection utilized  Emotional Support Provided Provided psychoeducation for mental health needs  Caregiver stress acknowledged  Verbalization of feelings encouraged  Discussed caregiver resources and support:   LCSW spoke with patient's daughter, Andreana Klingerman [place]in visits patient 2 x per week at minimum. Patient is experiencing sundowning at the facility resulting in concerns regarding pt's behavior. Family has scheduled an upcoming Zoom call to discuss concerns and/or questions with [facility] on 02/03/23 [name] continues to speak with sisters regarding medication needs and/or patient care needs LCSW discussed strategies to assist with promoting healthy communication with family and/or providers. Family will write down questions for upcoming appt Family will contact PCP office with any needs or questions that may arise            If you are experiencing a Mental Health or [place] or need someone to talk to, please call the Suicide and Crisis Lifeline: 988 call 911   Patient verbalizes understanding of instructions and care plan provided today and agrees to view in [place]. Active MyChart status and patient understanding of how to access instructions and care plan via MyChart confirmed with patient.     No further follow up required: Patient is placed at [facility]. No additional needs  Christa See, MSW, [email or web address] Phone [phone] 1:01 PM

## 2023-02-01 NOTE — Telephone Encounter (Signed)
Called and lm for pt tcb. 

## 2023-02-01 NOTE — Patient Outreach (Signed)
  Care Coordination   Follow Up Visit Note   01/31/23 Name: Jerry [name] MRN: [id number] DOB: 07/24/1936  Jerry [name] is a 87 y.o. year old male who sees Hunter, Brayton Mars, MD for primary care. I spoke with  Jerry [name]'s daughter, Jerry [name], by phone today.  What matters to the patients health and wellness today?  Caregiver Fatigue    Goals Addressed             This Visit's Progress    COMPLETED: Caregiver Fatigue-LCSW   On track    Care Coordination Interventions: Solution-Focused Strategies employed:  Active listening / Reflection utilized  Emotional Support Provided Provided psychoeducation for mental health needs  Caregiver stress acknowledged  Verbalization of feelings encouraged  Discussed caregiver resources and support:   LCSW spoke with patient's daughter, Jerry [name] visits patient 2 x per week at minimum. Patient is experiencing sundowning at the facility resulting in concerns regarding pt's behavior. Family has scheduled an upcoming Zoom call to discuss concerns and/or questions with [facility] on 02/03/23 Jerry [name] continues to speak with sisters regarding medication needs and/or patient care needs LCSW discussed strategies to assist with promoting healthy communication with family and/or providers. Family will write down questions for upcoming appt Family will contact PCP office with any needs or questions that may arise            SDOH assessments and interventions completed:  No     Care Coordination Interventions:  Yes, provided   Follow up plan: No further intervention required.   Encounter Outcome:  Pt. Visit Completed   Christa See, MSW, [email or web address] Phone [phone] 1:00 PM

## 2023-02-02 NOTE — Telephone Encounter (Signed)
Please call daughter Cristopher Estimable back at [phone], she has some questions on meds and some misunderstanding on some medications. Please advise.

## 2023-02-03 NOTE — Telephone Encounter (Signed)
Called and lm for [employer].

## 2023-02-06 NOTE — Progress Notes (Signed)
TIANDRE TEALL is here today for follow up post radiation to the lung.  Lung Side: rt lung  Does the patient complain of any of the following: Pain:none Shortness of breath w/wo exertion: none Cough: some Hemoptysis: no Pain with swallowing: no Swallowing/choking concerns: no Appetite: good Energy Level: no Post radiation skin Changes: no    Additional comments if applicable: Vitals:   16/10/96 1521  BP: 136/76  Pulse: 65  Resp: 18  Temp: (!) 97.4 F (36.3 C)  TempSrc: Temporal  SpO2: 100%  Weight: 101.8 kg  Height: 5\' 10"  (1.778 m)

## 2023-02-07 NOTE — Telephone Encounter (Signed)
Called and spoke with [facility] and she just wanted to let me know that her and her sister are going to meet with the facility today and will update [place] on how the melatonin and buspar are working together and she will update [place] in a few days.

## 2023-02-09 ENCOUNTER — Telehealth: Payer: Self-pay | Admitting: Pharmacist

## 2023-02-09 NOTE — Progress Notes (Signed)
Care Management & Coordination Services Pharmacy Team  Reason for Encounter: General adherence update   Contacted patient for general health update and medication adherence call.  Unsuccessful outreach. Left voicemail for patient to return call.   What concerns do you have about your medications?  The patient .. side effects with their medications.   How often do you forget or accidentally miss a dose?   Do you use a pillbox?   Are you having any problems getting your medications from your pharmacy?   Has the cost of your medications been a concern?  If yes, what medication and is patient assistance available or has it been applied for?  Since last visit with PharmD,  interventions have been made.   The patient  had an ED visit since last contact.   The patient  problems with their health.   Patient  concerns or questions for , PharmD at this time.   Counseled patient on:    Chart Updates:  Recent office visits:  12/29/2022 OV (PCP) Marin Olp, MD; no medication changes indicated.  Recent consult visits:  None  Hospital visits:  None since last CPP visit  Medications: Outpatient Encounter Medications as of 02/09/2023  Medication Sig   amLODipine-olmesartan (AZOR) 5-40 MG tablet TAKE ONE TABLET BY MOUTH EVERY DAY   busPIRone (BUSPAR) 5 MG tablet Take 1 tablet (5 mg total) by mouth 2 (two) times daily.   diclofenac Sodium (VOLTAREN) 1 % GEL Apply 2 g topically 4 (four) times daily.   donepezil (ARICEPT) 10 MG tablet TAKE ONE TABLET BY MOUTH EVERY DAY   ferrous sulfate 325 (65 FE) MG tablet Take three times a week   Multiple Vitamin (MULTIVITAMIN) tablet Take 1 tablet by mouth daily. Centrum silver   rosuvastatin (CRESTOR) 20 MG tablet TAKE ONE TABLET BY MOUTH EVERY DAY   No facility-administered encounter medications on file as of 02/09/2023.    Recent vitals BP Readings from Last 3 Encounters:  12/29/22 118/60  11/03/22 119/63  10/22/22 108/70    Pulse Readings from Last 3 Encounters:  12/29/22 (!) 57  11/03/22 64  10/22/22 65   Wt Readings from Last 3 Encounters:  12/29/22 223 lb 9.6 oz (101.4 kg)  11/03/22 224 lb 6 oz (101.8 kg)  10/06/22 223 lb 12.8 oz (101.5 kg)   BMI Readings from Last 3 Encounters:  12/29/22 32.08 kg/m  11/03/22 32.19 kg/m  10/06/22 32.11 kg/m    Recent lab results    Component Value Date/Time   NA 143 12/29/2022 1256   K 4.6 12/29/2022 1256   CL 111 12/29/2022 1256   CO2 28 12/29/2022 1256   GLUCOSE 83 12/29/2022 1256   BUN 17 12/29/2022 1256   CREATININE 1.42 12/29/2022 1256   CREATININE 1.26 (H) 10/12/2020 1640   CALCIUM 8.9 12/29/2022 1256    Lab Results  Component Value Date   CREATININE 1.42 12/29/2022   GFR 45.39 (L) 12/29/2022   GFRNONAA 48 (L) 10/06/2022   GFRAA >60 06/21/2017   Lab Results  Component Value Date/Time   HGBA1C 6.1 09/09/2021 02:15 PM    Lab Results  Component Value Date   CHOL 131 12/29/2022   HDL 33.10 (L) 12/29/2022   LDLCALC 74 12/29/2022   LDLDIRECT 58.0 09/09/2021   TRIG 119.0 12/29/2022   CHOLHDL 4 12/29/2022    Care Gaps: Annual wellness visit in last year? No  Star Rating Drugs:  Amlodipine-Olmesartan last filled 12/21/2022 90 DS Rosuvastatin 20 mg last filled 12/20/2022  90 DS   Future Appointments  Date Time Provider [place]  02/10/2023  2:30 PM WL-CT 1 WL-CT [place]  02/13/2023  3:00 PM Gery Pray, MD [facility] None  06/19/2023  2:00 PM Edythe Clarity, [place] None   April D Calhoun, [place] Pharmacist Assistant [phone]

## 2023-02-10 ENCOUNTER — Ambulatory Visit (HOSPITAL_COMMUNITY)
Admission: RE | Admit: 2023-02-10 | Discharge: 2023-02-10 | Disposition: A | Discharge status: Home / Self Care | Payer: Medicare Other | Source: Ambulatory Visit | Attending: Radiation Oncology | Admitting: Radiation Oncology

## 2023-02-10 DIAGNOSIS — C3490 Malignant neoplasm of unspecified part of unspecified bronchus or lung: Secondary | ICD-10-CM | POA: Diagnosis not present

## 2023-02-10 DIAGNOSIS — J439 Emphysema, unspecified: Secondary | ICD-10-CM | POA: Diagnosis not present

## 2023-02-10 DIAGNOSIS — R911 Solitary pulmonary nodule: Secondary | ICD-10-CM | POA: Diagnosis not present

## 2023-02-10 DIAGNOSIS — R918 Other nonspecific abnormal finding of lung field: Secondary | ICD-10-CM | POA: Diagnosis not present

## 2023-02-13 ENCOUNTER — Encounter: Payer: Self-pay | Admitting: Radiation Oncology

## 2023-02-13 ENCOUNTER — Ambulatory Visit
Admission: RE | Admit: 2023-02-13 | Discharge: 2023-02-13 | Disposition: A | Discharge status: Home / Self Care | Payer: Medicare Other | Source: Ambulatory Visit | Attending: Radiation Oncology | Admitting: Radiation Oncology

## 2023-02-13 ENCOUNTER — Telehealth: Payer: Self-pay | Admitting: Family Medicine

## 2023-02-13 VITALS — BP 136/76 | HR 65 | Temp 97.4°F | Resp 18 | Ht 70.0 in | Wt 224.5 lb

## 2023-02-13 DIAGNOSIS — Z79899 Other long term (current) drug therapy: Secondary | ICD-10-CM | POA: Diagnosis not present

## 2023-02-13 DIAGNOSIS — Z85118 Personal history of other malignant neoplasm of bronchus and lung: Secondary | ICD-10-CM | POA: Diagnosis not present

## 2023-02-13 DIAGNOSIS — R911 Solitary pulmonary nodule: Secondary | ICD-10-CM | POA: Diagnosis not present

## 2023-02-13 DIAGNOSIS — J432 Centrilobular emphysema: Secondary | ICD-10-CM | POA: Insufficient documentation

## 2023-02-13 DIAGNOSIS — N200 Calculus of kidney: Secondary | ICD-10-CM | POA: Insufficient documentation

## 2023-02-13 DIAGNOSIS — F0390 Unspecified dementia without behavioral disturbance: Secondary | ICD-10-CM | POA: Insufficient documentation

## 2023-02-13 DIAGNOSIS — M25562 Pain in left knee: Secondary | ICD-10-CM

## 2023-02-13 DIAGNOSIS — Z923 Personal history of irradiation: Secondary | ICD-10-CM | POA: Insufficient documentation

## 2023-02-13 NOTE — Telephone Encounter (Signed)
Caller states: - Patient has been experiencing worsening knee pain; believes it is the left - Patient has started limping  I offered caller an OV w/pcp for evaluation but she declined. States that it is difficult for patient to make I in office.   Caller requests: -PCP order imaging to see what's going on with his knee   Please Advise.

## 2023-02-13 NOTE — Progress Notes (Signed)
Radiation Oncology         [phone] ________________________________  Name: Jerry [name] MRN: [id number]  Date: 02/13/2023  DOB: Jan 22, 1936  Follow-Up Visit Note  CC: Marin Olp, MD  Marin Olp, MD    ICD-10-CM   1. Lung nodule  R91.1     2. History of lung cancer  Z85.118 CT CHEST WO CONTRAST      Diagnosis:  Clinical Stage IA adenocarcinoma of the right upper lobe   Interval Since Last Radiation: 5 years, 9 months, and 4 days   05/01/17 - 05/09/17: Right lung / 54 Gy in 3 fractions   07/2000: Prostate radioactive seed placement (Dr. Valere Dross)  Narrative:  The patient returns today for routine follow-up and to review recent imaging. He was last seen here for follow-up on 01/24/22 (he has missed his last several follow-up appointments). The patent's baseline confusion as progressed in the interval. He continues to participate in the adult memory care program at [facility] from 9-4 each day.  Since his last visit, the patient has had several ED encounters, detailed as follows:  -- ED 09/17/22: Patient presented with sudden onset chest pain beginning that morning. Work-up was unremarkable, and his pain was reproducible on palpation. In light of this, his pain was likely musculoskeletal in etiology and he was discharged home.  -- ED 09/23/22: The patient's daughter brought him in to rule out hypoxia in the setting of new rhinorrhea. The patient's daughter also noticed some increased confusion and thought that he may have a UTI (patient has mild confusion at baseline from dementia). Vital signs were within normal limits, and the patient's daughter opted to have him evaluated for UTI in an OP clinic the following day.     -- [facility] 09/24/22: The patient presented for evaluation of UTI due to increased confusion x 4 days. UA was negative for infection. In light of his runny nose, a COVID test was collected and the patient was advised to take Coricidin for  management of cold and flu symptoms due to his history of hypertension.  His COVID test came back positive and the patient's PCP prescribed him paxlovid.    -- Patient presented to [facility] ED on 10/06/22 with increased confusion and dizziness. CT of the head performed showed no acute intracranial abnormalities and the patient opted for discharge.   -- Patient presented to the [facility] urgent care on 10/22/22 with congestion / concern for URI. COVID test came back negative and the patient's care giver was advised to obtain OTC cold medicine for him.    Pertinent imaging performed in the interval since the patient was last seen is detailed as follows:  -- Chest CT w/o contrast on 08/04/22 showed stable findings and no evidence of recurrent or metastatic disease. Stable treatment changes were also appreciated in the RUL.           -- His more recent chest CT w/o contrast on 02/10/23 again shows stable findings without evidence of recurrent or metastatic disease. Radiation fibrosis in the RUL also appears stable.      On evaluation today the patient is accompanied by his daughter.  She reports he is now in a assisted facility due to memory issues.   No reports of significant cough or hemoptysis.  Patient reports no breathing issues.  He is less communicative as on previous occasions.  Allergies:  is allergic to namenda [memantine].  Meds: Current Outpatient Medications  Medication Sig Dispense Refill  amLODipine-olmesartan (AZOR) 5-40 MG tablet TAKE ONE TABLET BY MOUTH EVERY DAY 90 tablet PRN   busPIRone (BUSPAR) 5 MG tablet Take 1 tablet (5 mg total) by mouth 2 (two) times daily. 60 tablet 5   donepezil (ARICEPT) 10 MG tablet TAKE ONE TABLET BY MOUTH EVERY DAY 90 tablet PRN   ferrous sulfate 325 (65 FE) MG tablet Take three times a week 40 tablet 3   Multiple Vitamin (MULTIVITAMIN) tablet Take 1 tablet by mouth daily. Centrum silver     rosuvastatin (CRESTOR) 20 MG tablet TAKE ONE TABLET BY MOUTH EVERY DAY 90  tablet PRN   diclofenac Sodium (VOLTAREN) 1 % GEL Apply 2 g topically 4 (four) times daily. (Patient not taking: Reported on 02/13/2023) 100 g 3   No current facility-administered medications for this encounter.    Physical Findings: The patient is in no acute distress. Patient is alert and oriented.  height is 5\' 10"  (1.778 m) and weight is 224 lb 8 oz (101.8 kg). His temporal temperature is 97.4 F (36.3 C) (abnormal). His blood pressure is 136/76 and his pulse is 65. His respiration is 18 and oxygen saturation is 100%. .   Lungs are clear to auscultation bilaterally. Heart has regular rate and rhythm. No palpable cervical, supraclavicular, or axillary adenopathy. Abdomen soft, non-tender, normal bowel sounds.   Lab Findings: Lab Results  Component Value Date   WBC 8.1 10/06/2022   HGB 14.0 10/06/2022   HCT 45.3 10/06/2022   MCV 91.9 10/06/2022   PLT 208 10/06/2022    Radiographic Findings: CT CHEST WO CONTRAST  Result Date: 02/12/2023 CLINICAL DATA:  Non-small-cell lung cancer. Restaging. * Tracking Code: BO * EXAM: CT CHEST WITHOUT CONTRAST TECHNIQUE: Multidetector CT imaging of the chest was performed following the standard protocol without IV contrast. RADIATION DOSE REDUCTION: This exam was performed according to the departmental dose-optimization program which includes automated exposure control, adjustment of the mA and/or kV according to patient size and/or use of iterative reconstruction technique. COMPARISON:  08/04/2022 FINDINGS: Cardiovascular: The heart size is normal. No substantial pericardial effusion. Coronary artery calcification is evident. Mild atherosclerotic calcification is noted in the wall of the thoracic aorta. Mediastinum/Nodes: No mediastinal lymphadenopathy. No evidence for gross hilar lymphadenopathy although assessment is limited by the lack of intravenous contrast on the current study. The esophagus has normal imaging features. There is no axillary  lymphadenopathy. Lungs/Pleura: Subtle changes of centrilobular emphysema. bandlike opacity in the peripheral right upper lobe is stable, compatible with radiation fibrosis. 8 mm subpleural nodular opacity in the right middle lobe on 87/5 is unchanged. 7 mm right lower lobe nodule on 116/5 is stable. 3 mm left upper lobe subpleural nodule seen medially on 84/5 is stable. No new suspicious pulmonary nodule or mass. No focal airspace consolidation. There is no evidence of pleural effusion. Upper Abdomen: 2 mm nonobstructing stone noted upper pole left kidney. Visualized portion of the upper abdomen otherwise unremarkable. Musculoskeletal: No worrisome lytic or sclerotic osseous abnormality. IMPRESSION: 1. Stable exam. No new or progressive findings to suggest recurrent or metastatic disease. 2. 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 3. Nonobstructing left renal stone. 4. Aortic Atherosclerosis (ICD10-I70.0) and Emphysema (ICD10-J43.9). Electronically Signed   By: Misty Stanley M.D.   On: 02/12/2023 08:09    Impression:  Clinical Stage IA adenocarcinoma of the right upper lobe   No evidence of recurrence on clinical exam today.  Recent chest CT scan favorable.  Plan: Follow-up in 1  year.  Chest CT scan prior to this follow-up appointment unless the patient's dementia progresses significantly.   22 minutes of total time was spent for this patient encounter, including preparation, face-to-face counseling with the patient and coordination of care, physical exam, and documentation of the encounter. ____________________________________  Blair Promise, PhD, MD  This document serves as a record of services personally performed by Gery Pray, MD. It was created on his behalf by Roney Mans, a trained medical scribe. The creation of this record is based on the scribe's personal observations and the provider's statements to them. This document has been checked and approved  by the attending provider.

## 2023-02-14 NOTE — Telephone Encounter (Signed)
It is difficult to know if he even needs an x-ray without an exam and with limited history ability.  I would recommend a sports medicine evaluation-they have x-ray in our office and ultrasound equipment to be able to evaluate.

## 2023-02-15 NOTE — Telephone Encounter (Signed)
Called and spoke with [facility] and referral has been placed to sports medicine.

## 2023-02-20 NOTE — Progress Notes (Unsigned)
Jerry Goltz, PhD, LAT, ATC acting as a scribe for Jerry Leader, MD.  Subjective:    CC: L Knee pain  HPI: Patient is an 87 year old male presenting with knee pain. Pt is unsure to it's origin  Patient locates pain to all over his L knee. Pt recently moved to [facility] as he suffers from sever dementia.   L Knee swelling: no Mechanical symptoms: no Aggravates: transitioning to stand, walking Treatments tried: unsure  Pertinent review of Systems: no fever or chills  Relevant historical information: [facility] [phone] fax [phone]  Objective:    Vitals:   02/21/23 1250  BP: (!) 152/76  Pulse: 64  SpO2: 98%   General: Well Developed, well nourished, and in no acute distress.   MSK: left knee: Mild effusion. Normal appearing otherwise.  Normal motion with mild crepitation.  Non-tender.  Stable ligamentous exam.  Intact strength.  Neg McMurray test.     Lab and Radiology Results   Procedure: Real-time Ultrasound Guided Injection of left knee superior lateral patellar space Device: Philips Affiniti 50G Images permanently stored and available for review in PACS Verbal informed consent obtained.  Discussed risks and benefits of procedure. Warned about infection, bleeding, hyperglycemia damage to structures among others. Patient expresses understanding and agreement Time-out conducted.   Noted no overlying erythema, induration, or other signs of local infection.   Skin prepped in a sterile fashion.   Local anesthesia: Topical Ethyl chloride.   With sterile technique and under real time ultrasound guidance: 40 mg of Kenalog and 2 mL Marcaine injected into knee joint. Fluid seen entering the joint capsule.   Completed without difficulty   Pain immediately resolved suggesting accurate placement of the medication.   Advised to call if fevers/chills, erythema, induration, drainage, or persistent bleeding.   Images  permanently stored and available for review in the ultrasound unit.  Impression: Technically successful ultrasound guided injection.   Xray images left knee obtained today personally and independently reviewed Mild to moderate medial compartment DJD and mild patellofemoral DJD.  No acute fractures are visible. Await formal radiology review. .    Impression and Recommendations:    Assessment and Plan: 87 y.o. male with chronic left knee pain thought to be due to exacerbation of DJD.  Plan for steroid injection and scheduled Tylenol arthritis.  He is living at a care home.  He does have as needed Voltaren gel on his MAR but I do not think he is remembering to ask for it (due to his dementia) so he is not getting it.  Will use scheduled Tylenol arthritis which I think may be a little easier to use and probably will help some.  Plan on recheck in about 3 months.  At that time could do repeat steroid injection if needed.  Normally physical therapy will be helpful but I do not have a lot of confidence that he will retain the home exercise teaching with physical therapy well.    PDMP not reviewed this encounter. Orders Placed This Encounter  Procedures   [place] LIMITED JOINT SPACE STRUCTURES LOW LEFT(NO LINKED CHARGES)    Order Specific Question:   Reason for Exam (SYMPTOM  OR DIAGNOSIS REQUIRED)    Answer:   left knee pain    Order Specific Question:   Preferred imaging location?    Answer:   [facility]   DG Knee AP/LAT W/[place] Left    Standing Status:   Future  Number of Occurrences:   1    Standing Expiration Date:   03/22/2023    Order Specific Question:   Reason for Exam (SYMPTOM  OR DIAGNOSIS REQUIRED)    Answer:   left knee pain    Order Specific Question:   Preferred imaging location?    Answer:   [facility]   No orders of the defined types were placed in this encounter.   Discussed warning signs or symptoms. Please see discharge instructions.  Patient expresses understanding.   The above documentation has been reviewed and is accurate and complete Jerry [name], M.D.

## 2023-02-21 ENCOUNTER — Ambulatory Visit (INDEPENDENT_AMBULATORY_CARE_PROVIDER_SITE_OTHER): Payer: Medicare Other

## 2023-02-21 ENCOUNTER — Ambulatory Visit: Payer: Self-pay

## 2023-02-21 ENCOUNTER — Ambulatory Visit (INDEPENDENT_AMBULATORY_CARE_PROVIDER_SITE_OTHER): Payer: Medicare Other | Admitting: Family Medicine

## 2023-02-21 VITALS — BP 152/76 | HR 64 | Ht 70.0 in | Wt 225.0 lb

## 2023-02-21 DIAGNOSIS — M25562 Pain in left knee: Secondary | ICD-10-CM

## 2023-02-21 DIAGNOSIS — G8929 Other chronic pain: Secondary | ICD-10-CM

## 2023-02-21 NOTE — Patient Instructions (Addendum)
Thank you for coming in today.   You received an injection today. Seek immediate medical attention if the joint becomes red, extremely painful, or is oozing fluid.   Please use Voltaren gel (Generic Diclofenac Gel) up to 4x daily for pain as needed.  This is available over-the-counter as both the name brand Voltaren gel and the generic diclofenac gel.   Check back in 3 months

## 2023-02-23 NOTE — Progress Notes (Signed)
Left knee x-ray shows arthritis. Please fax report [place] Phone [phone] fax [phone]

## 2023-04-17 ENCOUNTER — Telehealth: Payer: Self-pay | Admitting: Family Medicine

## 2023-04-17 MED ORDER — TRAZODONE HCL 50 MG PO TABS: 25.0000 mg | ORAL_TABLET | Freq: Every evening | ORAL | 3 refills | Status: DC | PRN

## 2023-04-17 NOTE — Telephone Encounter (Signed)
See phone note from 01/26/2023- did he ever try trazodone? Are they wanting to have him try that for sleep now?

## 2023-04-17 NOTE — Telephone Encounter (Signed)
LMOVM advising a call back in regards to if patient ever tried trazodone

## 2023-04-17 NOTE — Telephone Encounter (Signed)
Patient's daughter Thompson Grayer called to request a medication be prescribed for Patient to be able to calm down and help him sleep at night (does not want to increase Melatonin).  Declined office visit.  Requests to be called to advised.

## 2023-04-17 NOTE — Telephone Encounter (Signed)
Porfirio Mylar from [facility] [phone] called in stating that patient is starting to sundown and wondering into other resident's rooms. They are requesting medication to help calm patient down. Also left a message for his daughter to call me back about trazodone.

## 2023-04-17 NOTE — Telephone Encounter (Signed)
I sent in trazodone to try to help him rest.  Another idea would be to set up an identifying/tracking system if he leaves his room so he can be redirected back to his room or particularly if he were to enter into another room.    Beyond this we would be looking at medicines like antipsychotics-I would need to sit down with family to discuss these as these increase the risk of death.  Medicine like Ativan, Xanax, Klonopin would not be recommended as they would increase the risk of falls and subsequent injury

## 2023-04-17 NOTE — Addendum Note (Signed)
Addended by: Shelva Majestic on: 04/17/2023 08:44 PM   Modules accepted: Orders

## 2023-04-20 NOTE — Telephone Encounter (Signed)
Home Health Verbal Orders  Agency:  [facility]  Caller: Mertha Baars and title  Requesting OT/ PT/ Skilled nursing/ Social Work/ Speech:    Please call back with verbal orders on meds  Frequency:    HH needs F2F w/in last 30 days     [phone]

## 2023-04-20 NOTE — Telephone Encounter (Signed)
Spoke with Porfirio Mylar and Rx faxed directly to her.

## 2023-04-20 NOTE — Telephone Encounter (Signed)
Caller called for an update on situation. Informed caller that Jerry [name] had called for permission for medication as seen below. Also informed her that rx order has been faxed.   While caller was grateful that the ordeal was handled, she would like to know why she wasn't contacted prior to rx order being faxed. States she would like a call back regarding this.

## 2023-05-29 ENCOUNTER — Telehealth: Payer: Self-pay | Admitting: Family Medicine

## 2023-05-29 MED ORDER — TRAZODONE HCL 50 MG PO TABS: 25.0000 mg | ORAL_TABLET | Freq: Every evening | ORAL | 3 refills | Status: DC | PRN

## 2023-05-29 NOTE — Telephone Encounter (Signed)
Refill sent to requested pharmacy.

## 2023-05-29 NOTE — Telephone Encounter (Signed)
Prescription Request  05/29/2023  LOV: 12/29/2022  What is the name of the medication or equipment? Trazodone 50 MG  Have you contacted your pharmacy to request a refill? No   Which pharmacy would you like this sent to?  [facility]  Phone: [phone] Fax: [phone]    Patient notified that their request is being sent to the clinical staff for review and that they should receive a response within 2 business days.   Please advise at Mobile [phone] (mobile)

## 2023-05-30 ENCOUNTER — Ambulatory Visit: Payer: Medicare Other | Admitting: Family Medicine

## 2023-05-30 NOTE — Progress Notes (Deleted)
   Jerry Payor, PhD, LAT, ATC acting as a scribe for Jerry Graham, MD.  Jerry [name] is a 87 y.o. male who presents to [facility] today for 64-month f/u L knee pain. He lives at [facility] as he suffers from sever dementia. Pt was last seen by Dr. Denyse Amass on 02/21/23 and was given a L knee steroid injection and his care team was advised to schedule Tylenol arthritis. Today, pt reports ***  Dx imaging: 02/21/23 L knee XR  Pertinent review of systems: ***  Relevant historical information: ***   Exam:  There were no vitals taken for this visit. General: Well Developed, well nourished, and in no acute distress.   MSK: ***    Lab and Radiology Results No results found for this or any previous visit (from the past 72 hour(s)). No results found.     Assessment and Plan: 87 y.o. male with ***   PDMP not reviewed this encounter. No orders of the defined types were placed in this encounter.  No orders of the defined types were placed in this encounter.    Discussed warning signs or symptoms. Please see discharge instructions. Patient expresses understanding.   ***

## 2023-06-15 ENCOUNTER — Telehealth: Payer: Self-pay

## 2023-06-15 NOTE — Progress Notes (Signed)
   Care Guide Note  06/15/2023 Name: JAXXYN BEAUCHEMIN MRN: [id number] DOB: 07/23/1936  Referred by: Shelva Majestic, MD Reason for referral : Care Coordination (Outreach to schedule f/u with [facility] for DM f/u )   DANISH JAHNKE is a 87 y.o. year old male who is a primary care patient of Shelva Majestic, MD. [name] was referred to the pharmacist for assistance related to DM.    An unsuccessful telephone outreach was attempted today to contact the patient who was referred to the pharmacy team for assistance with medication management. Additional attempts will be made to contact the patient.   [name], RMA Care Guide [facility]  [place], [place] [address] Direct Dial: [phone] [email or web address]

## 2023-06-19 ENCOUNTER — Telehealth: Payer: Self-pay | Admitting: Family Medicine

## 2023-06-19 ENCOUNTER — Encounter: Payer: Medicare Other | Admitting: Pharmacist

## 2023-06-19 NOTE — Telephone Encounter (Signed)
Pt's daughter is concerned pt needs to be checked. She has found a lesion under his arm & thinks he may have some on bottom from not being properly cleaned at the assisted living center. She is asking to have him checked asap. No available appts & only wants him to see pcp. Please advise.

## 2023-06-20 NOTE — Telephone Encounter (Signed)
See below

## 2023-06-20 NOTE — Telephone Encounter (Signed)
Schedule next available follow-up-please inform them that I have upcoming vacation and I do not have a sooner work in.  They can also schedule with another provider or go to an urgent care-also make sure they give feedback to the staff at the assisted living.  He may need to be in memory care

## 2023-06-20 NOTE — Telephone Encounter (Signed)
FYI

## 2023-07-25 ENCOUNTER — Ambulatory Visit: Payer: Medicare Other | Admitting: Family Medicine

## 2023-07-25 NOTE — Progress Notes (Signed)
   Care Guide Note  07/25/2023 Name: Jerry [name] MRN: [id number] DOB: 12/04/1936  Referred by: Shelva Majestic, MD Reason for referral : Care Coordination (Outreach to schedule f/u with [facility] for DM f/u )   Jerry [name] is a 87 y.o. year old male who is a primary care patient of Shelva Majestic, MD. [name] was referred to the pharmacist for assistance related to HTN.    A second unsuccessful telephone outreach was attempted today to contact the patient who was referred to the pharmacy team for assistance with medication management. Additional attempts will be made to contact the patient.  [name], RMA Care Guide [facility]  [place], [place] [address] Direct Dial: [phone] [email or web address]

## 2023-08-15 NOTE — Progress Notes (Signed)
   Care Guide Note  08/15/2023 Name: Jerry [name] MRN: [id number] DOB: 1936/01/20  Referred by: Shelva Majestic, MD Reason for referral : Care Coordination (Outreach to schedule f/u with [facility] for DM f/u )   Jerry [name] is a 87 y.o. year old male who is a primary care patient of Shelva Majestic, MD. Jerry [name] was referred to the pharmacist for assistance related to DM.    A third unsuccessful telephone outreach was attempted today to contact the patient who was referred to the pharmacy team for assistance with medication management. The Population Health team is pleased to engage with this patient at any time in the future upon receipt of referral and should he/she be interested in assistance from the [facility] team.   [name], RMA Care Guide [facility]  [place], [place] [address] Direct Dial: [phone] [email or web address]

## 2023-09-08 ENCOUNTER — Telehealth: Payer: Self-pay | Admitting: Family Medicine

## 2023-09-08 NOTE — Telephone Encounter (Signed)
The Tylenol is fine Rosuvastatin typically is given at bedtime Trazodone at night and buspirone 5 mg twice a day is reasonable  We do not have them amlodipine and losartan separately on his med list-we have amlodipine-olmesartan.  I request that we be given an updated copy of all of his medications and that team updates this in the chart and then I can review

## 2023-09-08 NOTE — Telephone Encounter (Signed)
Patient's daughter called and stated she wanted to speak with someone about patient's medications. States that patient is in assisted living and they seem to be giving patient his medication at different times. Caller is on patient's DPR and can be reached at [phone].

## 2023-09-08 NOTE — Telephone Encounter (Signed)
Caller requests to be called re: Medications.  states facility is giving pt Acetaminophen 650 3 x per day. States Rosuvastatin and 3 other medication are given at bed time. Amlodipine and Losartan are given every other day, not every day.   Does Dr. Durene Cal advise Patient take Buspar 5 mg 2 x per day and Trazodone 50 mg 1 tablet at night (is it safe). Requests to be advised.

## 2023-09-08 NOTE — Telephone Encounter (Signed)
Called and spoke with Malachi Bonds and she states facility is giving pt Acetaminophen 650 er TID and having him take his amlodipine every other day instead of daily and the Donepazil is now 10mg  at bedtime and rosuvastatin at bedtime so she is wanting to know if this is ok?

## 2023-09-11 NOTE — Telephone Encounter (Signed)
Called and spoke with Malachi Bonds and below message given.

## 2023-09-11 NOTE — Telephone Encounter (Signed)
Please contact the [facility]- amlodipoine- olmesartan 5-40 mg should be daily not every other day. Blood pressure typically above 140- taking medication(s) daily should help   Please tell them that risk of serotonin syndrome is low with low dose buspirone and trazodone  If they do not think he needs acetaminophen you can write order to discontinue this.   Nighttime dosing ok

## 2023-09-11 NOTE — Telephone Encounter (Signed)
Chart from [facility] in your "review folder".

## 2023-09-12 NOTE — Telephone Encounter (Signed)
Patient's daughter called requesting to speak with Ardine Bjork about the form that she brought in for Dr. Durene Cal to review from the [facility]. States she has some questions about patient's medications.

## 2023-09-12 NOTE — Telephone Encounter (Signed)
Called and spoke with Thompson Grayer and below message given, I informed [name] that I will call Porfirio Mylar at [facility] in the morning and give her a call back.

## 2023-09-13 NOTE — Telephone Encounter (Signed)
Called and lm on [name]'s vm tcb.

## 2023-09-15 NOTE — Telephone Encounter (Signed)
Patient's daughter Malachi Bonds requests sister Thompson Grayer  be called for status of previous messages

## 2023-09-18 NOTE — Telephone Encounter (Signed)
Updated Rx for Amoldipine-Olmesartan sent to [facility] with correct dosing.

## 2023-09-18 NOTE — Telephone Encounter (Signed)
Called and lm on Jerry [name] vm @[facility] tcb.

## 2023-10-16 ENCOUNTER — Telehealth: Payer: Self-pay | Admitting: Family Medicine

## 2023-10-16 DIAGNOSIS — R413 Other amnesia: Secondary | ICD-10-CM

## 2023-10-16 MED ORDER — TRAZODONE HCL 50 MG PO TABS: 25.0000 mg | ORAL_TABLET | Freq: Every evening | ORAL | 3 refills | Status: DC | PRN

## 2023-10-16 MED ORDER — ROSUVASTATIN CALCIUM 20 MG PO TABS: 20.0000 mg | ORAL_TABLET | Freq: Every day | ORAL | 99 refills | Status: DC

## 2023-10-16 MED ORDER — BUSPIRONE HCL 5 MG PO TABS: 5.0000 mg | ORAL_TABLET | Freq: Two times a day (BID) | ORAL | 5 refills | Status: DC

## 2023-10-16 MED ORDER — AMLODIPINE-OLMESARTAN 5-40 MG PO TABS: 1.0000 | ORAL_TABLET | Freq: Every day | ORAL | 99 refills | Status: DC

## 2023-10-16 MED ORDER — FERROUS SULFATE 325 (65 FE) MG PO TABS: ORAL_TABLET | ORAL | 3 refills | Status: DC

## 2023-10-16 MED ORDER — DONEPEZIL HCL 10 MG PO TABS: 10.0000 mg | ORAL_TABLET | Freq: Every day | ORAL | 99 refills | Status: DC

## 2023-10-16 NOTE — Telephone Encounter (Signed)
Rx sent to requested pharmacy

## 2023-10-16 NOTE — Telephone Encounter (Signed)
Prescription Request  10/16/2023  LOV: 12/29/2022  What is the name of the medication or equipment?   busPIRone (BUSPAR) 5 MG tablet  traZODone (DESYREL) 50 MG tablet  amLODipine-olmesartan (AZOR) 5-40 MG tablet  donepezil (ARICEPT) 10 MG tablet  rosuvastatin (CRESTOR) 20 MG tablet  ferrous sulfate 325 (65 FE) MG tablet   Have you contacted your pharmacy to request a refill? Yes   Which pharmacy would you like this sent to?   [facility] [place], [place] - [address] [place] [address] Phone: [phone] Fax: [phone]    Patient notified that their request is being sent to the clinical staff for review and that they should receive a response within 2 business days.   Please advise at Mobile [phone] (mobile)

## 2023-10-16 NOTE — Telephone Encounter (Signed)
Caller is Sarajane Marek from [employer] states she faxed over verbal order form to give permission to observe pt for 3 days following his flu shot. States he got his flu shot on the 10/03/23. States she has re-faxed this today.

## 2023-10-17 NOTE — Telephone Encounter (Signed)
I have not seen any orders

## 2023-10-18 ENCOUNTER — Ambulatory Visit: Payer: Medicare Other | Admitting: Family Medicine

## 2023-10-18 ENCOUNTER — Encounter: Payer: Self-pay | Admitting: Family Medicine

## 2023-10-18 VITALS — BP 128/66 | HR 72 | Temp 99.1°F | Resp 18 | Ht 70.0 in | Wt 214.0 lb

## 2023-10-18 DIAGNOSIS — R509 Fever, unspecified: Secondary | ICD-10-CM | POA: Diagnosis not present

## 2023-10-18 DIAGNOSIS — Z79899 Other long term (current) drug therapy: Secondary | ICD-10-CM

## 2023-10-18 LAB — COMPREHENSIVE METABOLIC PANEL
ALT: 12 U/L (ref 0–53)
AST: 15 U/L (ref 0–37)
Albumin: 3.7 g/dL (ref 3.5–5.2)
Alkaline Phosphatase: 57 U/L (ref 39–117)
BUN: 12 mg/dL (ref 6–23)
CO2: 24 meq/L (ref 19–32)
Calcium: 9 mg/dL (ref 8.4–10.5)
Chloride: 111 meq/L (ref 96–112)
Creatinine, Ser: 1.35 mg/dL (ref 0.40–1.50)
GFR: 47.96 mL/min — ABNORMAL LOW (ref >60.00)
Glucose, Bld: 88 mg/dL (ref 70–99)
Potassium: 4.2 meq/L (ref 3.5–5.1)
Sodium: 141 meq/L (ref 135–145)
Total Bilirubin: 0.4 mg/dL (ref 0.2–1.2)
Total Protein: 6.7 g/dL (ref 6.0–8.3)

## 2023-10-18 LAB — CBC WITH DIFFERENTIAL/PLATELET
Basophils Absolute: 0.1 10*3/uL (ref 0.0–0.1)
Basophils Relative: 0.7 % (ref 0.0–3.0)
Eosinophils Absolute: 0.2 10*3/uL (ref 0.0–0.7)
Eosinophils Relative: 2.2 % (ref 0.0–5.0)
HCT: 41.1 % (ref 39.0–52.0)
Hemoglobin: 13 g/dL (ref 13.0–17.0)
Lymphocytes Relative: 27.5 % (ref 12.0–46.0)
Lymphs Abs: 2 10*3/uL (ref 0.7–4.0)
MCHC: 31.6 g/dL (ref 30.0–36.0)
MCV: 92.8 fL (ref 78.0–100.0)
Monocytes Absolute: 0.4 10*3/uL (ref 0.1–1.0)
Monocytes Relative: 5.9 % (ref 3.0–12.0)
Neutro Abs: 4.7 10*3/uL (ref 1.4–7.7)
Neutrophils Relative %: 63.7 % (ref 43.0–77.0)
Platelets: 198 10*3/uL (ref 150.0–400.0)
RBC: 4.44 Mil/uL (ref 4.22–5.81)
RDW: 14.3 % (ref 11.5–15.5)
WBC: 7.4 10*3/uL (ref 4.0–10.5)

## 2023-10-18 LAB — POC URINALSYSI DIPSTICK (AUTOMATED)
Bilirubin, UA: NEGATIVE
Blood, UA: NEGATIVE
Glucose, UA: NEGATIVE
Ketones, UA: NEGATIVE
Leukocytes, UA: NEGATIVE
Nitrite, UA: NEGATIVE
Protein, UA: NEGATIVE
Spec Grav, UA: >=1.03 — AB (ref 1.010–1.025)
Urobilinogen, UA: 0.2 U/dL
pH, UA: 5.5 (ref 5.0–8.0)

## 2023-10-18 LAB — TSH: TSH: 1.57 u[IU]/mL (ref 0.35–5.50)

## 2023-10-18 NOTE — Progress Notes (Signed)
Labs look good.  Needs to drink more water if they can get him to do it

## 2023-10-18 NOTE — Progress Notes (Signed)
Subjective:     Patient ID: Jerry [name], male    DOB: 04/04/1936, 87 y.o.   MRN: [id number]  Chief Complaint  Patient presents with   Possible UTI    Daughter would like patient tested due to not being bathed properly at Assisted Living   Blood work    Would like any blood work needed     HPI Pt is accompanied by his daughter Malachi Bonds.   Possible UTI - His daughter states that the bathing at his assisted living facility is not on a regular schedule and not properly completed. She states some days when she visits her father, there are no signs that he has been bathed.  At times, underware are soiled and she's concerned he may have a UTI.  Would like a UTI screening. Has noticed her father becoming more agitated and confused-but always is.   Fever Symptoms - Flu shot on 10/09. Had a "fever" for 3 days following vaccination.  Resolved now.  No cough.  Health Maintenance Due  Topic Date Due   Medicare Annual Wellness (AWV)  03/22/2022    Past Medical History:  Diagnosis Date   Cancer of prostate (HCC) 2001   Chronic kidney disease (CKD), stage III (moderate) (HCC)    /notes 02/08/2017   Dementia (HCC)    DIVERTICULOSIS, COLON 07/25/2007   Qualifier: Diagnosis of  By: Mayford Knife, LPN, Domenic Polite    High cholesterol    History of radiation therapy 05/01/17-05/09/17   right lung 54 Gy in 3 fractions   History of radiation therapy    prostate radioactive seed placement 07/2000  Dr Dayton Scrape   Hypertension    Lung cancer (HCC) 03/2017    Past Surgical History:  Procedure Laterality Date   DIRECT LARYNGOSCOPY N/A 06/21/2017   Procedure: DIRECT LARYNGOSCOPY;  Surgeon: Suzanna Obey, MD;  Location: [facility] OR;  Service: ENT;  Laterality: N/A;   EYE SURGERY     INSERTION PROSTATE RADIATION SEED  07/2000   Hattie Perch 05/09/2011   PENILE PROSTHESIS IMPLANT  02/2002   Insertion of Mentor three piece inflatable penile prosthesis./notes 05/09/2011   PENILE PROSTHESIS PLACEMENT  06/2003   Placement of  Mentor rod prosthesis/notes 05/09/2011   REFRACTIVE SURGERY Bilateral    REMOVAL OF PENILE PROSTHESIS  06/2003   Removal of three-piece Mentor prosthesis/notes 05/09/2011   SCROTAL SURGERY  06/2003   Repair of scrotal defect/notes 05/09/2011     Current Outpatient Medications:    amLODipine-olmesartan (AZOR) 5-40 MG tablet, Take 1 tablet by mouth daily., Disp: 90 tablet, Rfl: PRN   busPIRone (BUSPAR) 5 MG tablet, Take 1 tablet (5 mg total) by mouth 2 (two) times daily., Disp: 60 tablet, Rfl: 5   diclofenac Sodium (VOLTAREN) 1 % GEL, Apply 2 g topically 4 (four) times daily., Disp: 100 g, Rfl: 3   donepezil (ARICEPT) 10 MG tablet, Take 1 tablet (10 mg total) by mouth daily., Disp: 90 tablet, Rfl: PRN   ferrous sulfate 325 (65 FE) MG tablet, Take three times a week, Disp: 40 tablet, Rfl: 3   Multiple Vitamin (MULTIVITAMIN) tablet, Take 1 tablet by mouth daily. Centrum silver, Disp: , Rfl:    rosuvastatin (CRESTOR) 20 MG tablet, Take 1 tablet (20 mg total) by mouth daily., Disp: 90 tablet, Rfl: PRN   traZODone (DESYREL) 50 MG tablet, Take 0.5 tablets (25 mg total) by mouth at bedtime as needed for sleep., Disp: 30 tablet, Rfl: 3  Allergies  Allergen Reactions   Namenda [Memantine] Other (  See Comments)    Made pt feel dizzy   ROS neg/noncontributory except as noted HPI/below      Objective:     BP 128/66   Pulse 72   Temp 99.1 F (37.3 C) (Temporal)   Resp 18   Ht 5\' 10"  (1.778 m)   Wt 214 lb (97.1 kg)   SpO2 98%   BMI 30.71 kg/m  Wt Readings from Last 3 Encounters:  10/18/23 214 lb (97.1 kg)  02/21/23 225 lb (102.1 kg)  02/13/23 224 lb 8 oz (101.8 kg)    Physical Exam   Gen: WDWN NAD HEENT: NCAT, conjunctiva not injected, sclera nonicteric TM WNL B, OP moist, no exudates NECK:  supple, no thyromegaly, no nodes, no carotid bruits CARDIAC: RRR, S1S2+, no murmur. DP 1+B LUNGS: CTAB. No wheezes ABDOMEN:  BS+, soft, NTND, No HSM, no masses EXT:  no edema MSK: no gross  abnormalities.  NEURO: Alert  singing.  CN II-XII intact. Not oriented.  Fair on following directions PSYCH: normal mood. Good eye contact  UA neg  Reviewed notes, history     Assessment & Plan:  Fever, unspecified fever cause -     POCT Urinalysis Dipstick (Automated) -     Urine Culture; Future -     Comprehensive metabolic panel -     CBC with Differential/Platelet -     TSH  High risk medication use -     Comprehensive metabolic panel -     CBC with Differential/Platelet -     TSH  1  fever-resolved.  Suspect from flu shot.  Will check UA, cx and labs 2.  High risk meds-pt w/dementia.  Pt would like labs checked.   Return in about 3 months (around 01/18/2024), or if symptoms worsen or fail to improve, for yearly w/Dr. Durene Cal.  Germaine Pomfret Rice,acting as a scribe for Angelena Sole, MD.,have documented all relevant documentation on the behalf of Angelena Sole, MD,as directed by  Angelena Sole, MD while in the presence of Angelena Sole, MD.  I, Angelena Sole, MD, have reviewed all documentation for this visit. The documentation on 10/18/23 for the exam, diagnosis, procedures, and orders are all accurate and complete.   Angelena Sole, MD

## 2023-10-18 NOTE — Patient Instructions (Signed)

## 2023-10-19 LAB — URINE CULTURE
MICRO NUMBER:: 15633767
Result:: NO GROWTH
SPECIMEN QUALITY:: ADEQUATE

## 2023-10-30 MED ORDER — AMLODIPINE-OLMESARTAN 5-40 MG PO TABS: 1.0000 | ORAL_TABLET | Freq: Every day | ORAL | 3 refills | Status: DC

## 2023-10-30 NOTE — Telephone Encounter (Signed)
Caller states patient is out of amlodipine. I informed caller that the medication was sent in on 10/21 to [facility]. Caller is requesting rx order be sent again and she will follow up with pharmacy.

## 2023-10-30 NOTE — Addendum Note (Signed)
Addended by: Gwenette Greet on: 10/30/2023 11:32 AM   Modules accepted: Orders

## 2023-10-30 NOTE — Telephone Encounter (Signed)
Refill has been resent. °

## 2023-12-07 ENCOUNTER — Telehealth: Payer: Self-pay

## 2023-12-07 NOTE — Telephone Encounter (Signed)
Called pt daughter Thompson Grayer per note for contacting patient, unable to LVM. If patient returns call please schedule AWV before 12/25/23 to close care [facility]

## 2023-12-11 ENCOUNTER — Telehealth: Payer: Self-pay | Admitting: Family Medicine

## 2023-12-11 NOTE — Telephone Encounter (Signed)
Forms received, in Dr. Erasmo Leventhal "to sign" folder.

## 2023-12-11 NOTE — Telephone Encounter (Signed)
Awaiting paperwork

## 2023-12-11 NOTE — Telephone Encounter (Signed)
Back on your desk.

## 2023-12-11 NOTE — Telephone Encounter (Signed)
Home Health Verbal Orders  Agency:  [facility]  Caller: Mertha Baars and title  Reason for Request:  They have faxed [place] some paperwork to be faxed back to their office as soon as possible.    [phone]

## 2023-12-12 NOTE — Telephone Encounter (Signed)
Forms have been faxed 

## 2023-12-29 ENCOUNTER — Telehealth: Payer: Self-pay | Admitting: Family Medicine

## 2023-12-29 ENCOUNTER — Telehealth: Payer: Self-pay | Admitting: *Deleted

## 2023-12-29 NOTE — Telephone Encounter (Signed)
 Called patient left voice massage is information  Per Dr Durene Cal Schedule next available visit please so we can discuss further-bring reports of sleeping patterns to visit please

## 2023-12-29 NOTE — Telephone Encounter (Signed)
 Copied from CRM [id number]. Topic: Clinical - Home Health Verbal Orders >> Dec 29, 2023 11:56 AM Mercedes MATSU wrote: Caller/Agency: [facility] ([facility]) Callback Number: [phone] Service Requested: Ms. Arloa states that the patient is not sleeping at night and is having a very hard time falling to sleep. The previous Melatonin prescribed is not working and he is up all night causing him to sleep during the day. Facility nurse Ms. Arloa is asking for verbal orders to prescribe the patient a stronger sleeping aid, is requesting a call back at the number provided.  Any new concerns about the patient? Yes    Please advise

## 2023-12-29 NOTE — Telephone Encounter (Signed)
 [facility] faxed document FL2, to be filled out by provider. Patient requested to send it back via Fax within 5-days. Document is located in providers tray at front office.Please advise at  [phone].

## 2023-12-29 NOTE — Telephone Encounter (Signed)
 Schedule next available visit please so we can discuss further-bring reports of sleeping patterns to visit please

## 2023-12-29 NOTE — Telephone Encounter (Unsigned)
 Copied from CRM [id number]. Topic: Appointments - Appointment Info/Confirmation >> Dec 29, 2023  3:16 PM Jerry [name] wrote: Patient/patient representative is calling for information regarding an appointment. // Patient daughter called in to give permission to allow Dedra only without any other family. Would like a distraction free  appointment on 01/03/23

## 2024-01-01 ENCOUNTER — Telehealth: Payer: Self-pay | Admitting: *Deleted

## 2024-01-01 NOTE — Telephone Encounter (Signed)
 Paperwork placed in your "to sign" folder.

## 2024-01-01 NOTE — Telephone Encounter (Signed)
 CALLED PATIENT TO INFORM OF CT FOR 01-12-24- ARRIVAL TIME- 12:15 PM @ [facility], NO RESTRICTIONS TO SCAN, PATIENT TO RECEIVE RESULTS FROM DR. KINARD ON 01-18-24 @ 11 AM, SPOKE WITH PATIENT'S DAUGHTER- GLORIA Essick AND MAILED APPT. CARDS, SPOKE WITH MS. P[place] AND SHE IS AWARE OF THESE APPTS. AND THE INSTRUCTIONS

## 2024-01-01 NOTE — Telephone Encounter (Signed)
 On your desk

## 2024-01-02 NOTE — Telephone Encounter (Signed)
 Forms completed and faxed back.

## 2024-01-04 ENCOUNTER — Encounter: Payer: Self-pay | Admitting: Family Medicine

## 2024-01-04 ENCOUNTER — Ambulatory Visit (INDEPENDENT_AMBULATORY_CARE_PROVIDER_SITE_OTHER): Payer: Medicare Other | Admitting: Family Medicine

## 2024-01-04 DIAGNOSIS — F03C0 Unspecified dementia, severe, without behavioral disturbance, psychotic disturbance, mood disturbance, and anxiety: Secondary | ICD-10-CM | POA: Diagnosis not present

## 2024-01-04 DIAGNOSIS — J439 Emphysema, unspecified: Secondary | ICD-10-CM

## 2024-01-04 DIAGNOSIS — N183 Chronic kidney disease, stage 3 unspecified: Secondary | ICD-10-CM | POA: Diagnosis not present

## 2024-01-04 MED ORDER — BELSOMRA 10 MG PO TABS: 10.0000 mg | ORAL_TABLET | Freq: Every evening | ORAL | 5 refills | Status: DC | PRN

## 2024-01-04 MED ORDER — BELSOMRA 10 MG PO TABS: 10.0000 mg | ORAL_TABLET | Freq: Every evening | ORAL | 5 refills | Status: DC
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
## 2024-01-04 NOTE — Patient Instructions (Addendum)
 Stop trazodone  - can go back to this if the others aren't covered and proceed to step 3  Trial Belsomra  10 mg nightly  2. -if too expensive may trial doxepin  3 mg  3. Also considering daytime changing buspirone  to a more sustained anti anxiety/agitation medication like Lexapro or similar  Please have CBC, CMP, lipid panel done under E78.5 hyperlipidemia and have these faxed to us    Recommended follow up: Return in about 1 year (around 01/03/2025) for followup or sooner if needed.Schedule b4 you leave.  -unless needed sooner for above issues (outside of plan listed)

## 2024-01-08 NOTE — Telephone Encounter (Signed)
 Copied from CRM [id number]. Topic: Clinical - Prescription Issue >> Jan 05, 2024  1:49 PM Robinson H wrote: Reason for CRM: Patient was given a prescription for Suvorexant  (BELSOMRA ) 10 MG TABS and pharmacy at facility needs a script  hard script. Agent verified fax and phone number and Dedra states phone number and fax number was incorrect, correct information below. [facility] [place] - [address] [phone] Phone [phone] Fax   [address] [place] [phone]

## 2024-01-09 ENCOUNTER — Other Ambulatory Visit: Payer: Self-pay

## 2024-01-09 ENCOUNTER — Telehealth: Payer: Self-pay | Admitting: Family Medicine

## 2024-01-09 MED ORDER — BELSOMRA 10 MG PO TABS: 10.0000 mg | ORAL_TABLET | Freq: Every evening | ORAL | 5 refills | Status: DC

## 2024-01-09 NOTE — Telephone Encounter (Signed)
 Rx has been faxed to number below and also sent via fax to [facility].

## 2024-01-09 NOTE — Telephone Encounter (Signed)
 Hard copy printed and placed in Dr. Erasmo Leventhal "to sign" folder.

## 2024-01-09 NOTE — Telephone Encounter (Signed)
 Copied from CRM [id number]. Topic: Clinical - Prescription Issue >> Jan 08, 2024  3:51 PM Rosina BIRCH wrote: Reason for CRM: Dedra from [facility] called stating the pharmacy need a hard script for the pt medication-belsomra  because it is a scheduled for. Dedra called about this medication on Friday CB [phone]

## 2024-01-09 NOTE — Telephone Encounter (Signed)
Back on your desk.

## 2024-01-09 NOTE — Telephone Encounter (Unsigned)
 Copied from CRM [id number]. Topic: Clinical - Prescription Issue >> Jan 09, 2024  9:01 AM Zayanah H wrote: Reason for CRM: Requesting hard prescription for Suvorexant  (BELSOMRA ) 10 MG TABS. Needs to be faxed over to [phone], Patient has been without trazodone  so not sleeping and having behavior issues.

## 2024-01-09 NOTE — Telephone Encounter (Signed)
 No phone call documented regarding this on Friday. The pt was seen by Dr. Durene Cal on Friday and Vincent attended the visit with pt. Hard script printed and placed in your "to sign' folder.

## 2024-01-12 ENCOUNTER — Ambulatory Visit (HOSPITAL_COMMUNITY)
Admission: RE | Admit: 2024-01-12 | Discharge: 2024-01-12 | Disposition: A | Discharge status: Home / Self Care | Payer: Medicare Other | Source: Ambulatory Visit | Attending: Radiation Oncology | Admitting: Radiation Oncology

## 2024-01-12 ENCOUNTER — Telehealth: Payer: Self-pay

## 2024-01-12 DIAGNOSIS — Z85118 Personal history of other malignant neoplasm of bronchus and lung: Secondary | ICD-10-CM | POA: Diagnosis not present

## 2024-01-12 DIAGNOSIS — J432 Centrilobular emphysema: Secondary | ICD-10-CM | POA: Diagnosis not present

## 2024-01-12 DIAGNOSIS — C3490 Malignant neoplasm of unspecified part of unspecified bronchus or lung: Secondary | ICD-10-CM | POA: Diagnosis not present

## 2024-01-12 DIAGNOSIS — I700 Atherosclerosis of aorta: Secondary | ICD-10-CM | POA: Diagnosis not present

## 2024-01-12 MED ORDER — DOXEPIN HCL 3 MG PO TABS: 3.0000 mg | ORAL_TABLET | Freq: Every evening | ORAL | 11 refills | Status: DC

## 2024-01-12 NOTE — Telephone Encounter (Signed)
Please send D/C order on belsomra to facility  I sent in doxepin for him to try as alternate- hoping this is more cost effective

## 2024-01-12 NOTE — Addendum Note (Signed)
Addended by: Shelva Majestic on: 01/12/2024 08:57 PM   Modules accepted: Orders

## 2024-01-12 NOTE — Telephone Encounter (Signed)
See below.  Copied from CRM [id number]. Topic: Clinical - Prescription Issue >> Jan 12, 2024 11:56 AM Irine Seal wrote: Reason for CRM: Joanie Coddington, stated the Suvorexant [facility]) 10 MG TABS has a $302 deductible and pt cannot afford the costs, she is requesting an alternative Rx for patient, and a discontinue order as soon as possible for the Suvorexant (BELSOMRA) 10 MG TABS   callback [phone]

## 2024-01-15 ENCOUNTER — Telehealth: Payer: Self-pay | Admitting: Family Medicine

## 2024-01-15 NOTE — Telephone Encounter (Signed)
Copied from CRM [id number]. Topic: Clinical - Medication Question >> Jan 15, 2024 10:41 AM Adele Barthel wrote: Reason for CRM: Sarajane Marek with [facility] is calling concerning his prescription Suvorexant (BELSOMRA) 10 MG TABS. The copay was $302, is financially not possible for family and the facility is not in compliance at this time. They are requiring a DC order for the medication to remain in compliance, because he has missed some dosages due to the cost. Fax # [phone] to send form back. Needs ASAP

## 2024-01-16 ENCOUNTER — Other Ambulatory Visit: Payer: Self-pay

## 2024-01-16 NOTE — Progress Notes (Signed)
  Radiation Oncology         [phone] ________________________________  Name: Jerry [name] MRN: [id number]  Date: 01/18/2024  DOB: [birth year]  Follow-Up Visit Note  CC: Shelva Majestic, MD  Shelva Majestic, MD  No diagnosis found.  Diagnosis: Clinical Stage IA adenocarcinoma of the right upper lobe   Interval Since Last Radiation:  6 years, 8 months, and 9 days   Radiation treatment dates:  05/01/17 - 05/09/17  Site/dose:  Right lung / 54 Gy in 3 fractions   07/2000: Prostate radioactive seed placement (Dr. Dayton Scrape)  Narrative:  The patient returns today for routine follow-up and to review recent CT chest results. He was last seen on 02-13-23. Most recent CT chest done on 01-12-24 shows no evidence of recurrent or metastatic disease. Post radiation scarring in the right upper lobe was indicated.    No other significant oncologic interval history since the patient was last seen.    He continues to follow up with his PCP, Dr. Netta Corrigan for management of his chronic conditions including dementia, HTN, and insomnia.    Allergies:  is allergic to namenda [memantine].  Meds: Current Outpatient Medications  Medication Sig Dispense Refill   amLODipine-olmesartan (AZOR) 5-40 MG tablet Take 1 tablet by mouth daily. 90 tablet 3   busPIRone (BUSPAR) 5 MG tablet Take 1 tablet (5 mg total) by mouth 2 (two) times daily. 60 tablet 5   diclofenac Sodium (VOLTAREN) 1 % GEL Apply 2 g topically 4 (four) times daily. 100 g 3   donepezil (ARICEPT) 10 MG tablet Take 1 tablet (10 mg total) by mouth daily. 90 tablet PRN   Doxepin HCl 3 MG TABS Take 1 tablet (3 mg total) by mouth at bedtime. 30 tablet 11   ferrous sulfate 325 (65 FE) MG tablet Take three times a week 40 tablet 3   Multiple Vitamin (MULTIVITAMIN) tablet Take 1 tablet by mouth daily. Centrum silver     rosuvastatin (CRESTOR) 20 MG tablet Take 1 tablet (20 mg total) by mouth daily. 90 tablet PRN   No current  facility-administered medications for this encounter.    Physical Findings: The patient is in no acute distress. Patient is alert and oriented.  vitals were not taken for this visit. .  No significant changes. Lungs are clear to auscultation bilaterally. Heart has regular rate and rhythm. No palpable cervical, supraclavicular, or axillary adenopathy. Abdomen soft, non-tender, normal bowel sounds.   Lab Findings: Lab Results  Component Value Date   WBC 7.4 10/18/2023   HGB 13.0 10/18/2023   HCT 41.1 10/18/2023   MCV 92.8 10/18/2023   PLT 198.0 10/18/2023    Radiographic Findings: Clinical Stage IA adenocarcinoma of the right upper lobe  No results found.  Impression:   The patient is recovering from the effects of radiation.  ***  Plan:  ***   *** minutes of total time was spent for this patient encounter, including preparation, face-to-face counseling with the patient and coordination of care, physical exam, and documentation of the encounter. ____________________________________  Billie Lade, PhD, MD  This document serves as a record of services personally performed by Antony Blackbird, MD. It was created on his behalf by Herbie Saxon, a trained medical scribe. The creation of this record is based on the scribe's personal observations and the provider's statements to them. This document has been checked and approved by the attending provider.

## 2024-01-16 NOTE — Progress Notes (Signed)
Belsorma has been d/c'd and order has been faxed to facility.

## 2024-01-16 NOTE — Telephone Encounter (Signed)
Rx to d/c Belsomra has been faxed to [place] at fax number below.

## 2024-01-17 ENCOUNTER — Telehealth: Payer: Self-pay | Admitting: *Deleted

## 2024-01-17 NOTE — Telephone Encounter (Signed)
CALLED PATIENT TO INFORM  OF FU APPT. BEING MOVED TO 11:15 AM ON 01-18-24 DUE TO DR. KINARD BEING IN THE OR, SPOKE WITH PATIENT'S DAUGHTER- GLORIA AND SHE IS AWARE OF THE CHANGE AND IS GOOD WITH THE CHANGE

## 2024-01-18 ENCOUNTER — Telehealth: Payer: Self-pay

## 2024-01-18 ENCOUNTER — Ambulatory Visit
Admission: RE | Admit: 2024-01-18 | Discharge: 2024-01-18 | Disposition: A | Discharge status: Home / Self Care | Payer: Medicare Other | Source: Ambulatory Visit | Attending: Radiation Oncology | Admitting: Radiation Oncology

## 2024-01-18 ENCOUNTER — Other Ambulatory Visit: Payer: Self-pay

## 2024-01-18 ENCOUNTER — Encounter: Payer: Self-pay | Admitting: Radiation Oncology

## 2024-01-18 VITALS — BP 143/81 | HR 66 | Temp 97.9°F | Resp 20 | Ht 69.0 in | Wt 216.4 lb

## 2024-01-18 DIAGNOSIS — Z923 Personal history of irradiation: Secondary | ICD-10-CM | POA: Insufficient documentation

## 2024-01-18 DIAGNOSIS — Z791 Long term (current) use of non-steroidal anti-inflammatories (NSAID): Secondary | ICD-10-CM | POA: Insufficient documentation

## 2024-01-18 DIAGNOSIS — I2510 Atherosclerotic heart disease of native coronary artery without angina pectoris: Secondary | ICD-10-CM | POA: Insufficient documentation

## 2024-01-18 DIAGNOSIS — Z79899 Other long term (current) drug therapy: Secondary | ICD-10-CM | POA: Insufficient documentation

## 2024-01-18 DIAGNOSIS — I700 Atherosclerosis of aorta: Secondary | ICD-10-CM | POA: Insufficient documentation

## 2024-01-18 DIAGNOSIS — Z85118 Personal history of other malignant neoplasm of bronchus and lung: Secondary | ICD-10-CM | POA: Diagnosis not present

## 2024-01-18 DIAGNOSIS — J432 Centrilobular emphysema: Secondary | ICD-10-CM | POA: Insufficient documentation

## 2024-01-18 DIAGNOSIS — K449 Diaphragmatic hernia without obstruction or gangrene: Secondary | ICD-10-CM | POA: Diagnosis not present

## 2024-01-18 DIAGNOSIS — I517 Cardiomegaly: Secondary | ICD-10-CM | POA: Insufficient documentation

## 2024-01-18 DIAGNOSIS — R911 Solitary pulmonary nodule: Secondary | ICD-10-CM | POA: Diagnosis not present

## 2024-01-18 MED ORDER — DOXEPIN HCL 3 MG PO TABS: 3.0000 mg | ORAL_TABLET | Freq: Every evening | ORAL | 11 refills | Status: DC

## 2024-01-18 NOTE — Telephone Encounter (Signed)
I received this message from [place] From [facility] "Hey Jerry [name] has been up for 48 hours. He is refusing care. Not really any "aggressive" behaviors right now but I would like to know the plan for getting him more stable, so we can prove his care". Can you please get back with me today?

## 2024-01-18 NOTE — Telephone Encounter (Signed)
Called and spoke with Jerry [name] and we realized the the Doxepin was sent to [facility] and not the pharmacy at [facility]. I have printed this rx and faxed to Jerry [name] so pt can start taking it and she will update [place] next week.

## 2024-01-18 NOTE — Progress Notes (Signed)
Jerry [name] is here today for follow up post radiation to the lung.  Lung Side: Right,patient completed treatment on 05/09/17.  Does the patient complain of any of the following:Patients daughter Malachi Bonds assisted with answering questions. Pain:Denies Shortness of breath w/wo exertion: Denies Cough: Denies Hemoptysis: Denies Pain with swallowing: Denies Swallowing/choking concerns: Denies Appetite: Good Energy Level: Fair Post radiation skin Changes: Denies    Additional comments if applicable: Patient is ready to get results from CT scan done on 01/12/2024.  BP (!) 143/81 (BP Location: Left Arm, Patient Position: Sitting, Cuff Size: Normal)   Pulse 66   Temp 97.9 F (36.6 C)   Resp 20   Ht 5\' 9"  (1.753 m)   Wt 216 lb 6.4 oz (98.2 kg)   SpO2 99%   BMI 31.96 kg/m

## 2024-01-18 NOTE — Telephone Encounter (Signed)
Did he start the doxepin?  How many nights is he taking it?  At the 3 mg dose

## 2024-01-22 MED ORDER — RAMELTEON 8 MG PO TABS: 8.0000 mg | ORAL_TABLET | Freq: Every day | ORAL | 5 refills | Status: DC

## 2024-01-22 NOTE — Telephone Encounter (Signed)
Please call Porfirio Mylar -Please discontinue doxepin -[facility] said that doxepin was going to be $200 for 30 days - Previously Belsomra was said to be $302  The latest zolpidem, eszopiclone and ramelteon as options -I am sending in ramelteon to [facility]

## 2024-01-22 NOTE — Addendum Note (Signed)
Addended by: Shelva Majestic on: 01/22/2024 06:00 PM   Modules accepted: Orders

## 2024-01-23 NOTE — Telephone Encounter (Signed)
Called and spoke with Porfirio Mylar and d/c order for Doxepin has been sent.

## 2024-01-26 ENCOUNTER — Telehealth: Payer: Self-pay

## 2024-01-26 MED ORDER — TRAZODONE HCL 100 MG PO TABS: 100.0000 mg | ORAL_TABLET | Freq: Every day | ORAL | 3 refills | Status: DC

## 2024-01-26 NOTE — Telephone Encounter (Signed)
Jerry [name] states [facility] informed her that the Ramelteon is not covered either. She would like to know if Trazadone could just be increased for pt since that is the only drug that is covered with no copay? Form in your "review" folder.

## 2024-01-26 NOTE — Telephone Encounter (Signed)
Try trazodone 100 mg-I sent this in for him.  May need to send an order directly to facility as well

## 2024-01-26 NOTE — Addendum Note (Signed)
Addended by: Shelva Majestic on: 01/26/2024 04:55 PM   Modules accepted: Orders

## 2024-01-29 ENCOUNTER — Other Ambulatory Visit: Payer: Self-pay

## 2024-01-29 MED ORDER — TRAZODONE HCL 100 MG PO TABS: 100.0000 mg | ORAL_TABLET | Freq: Every day | ORAL | 3 refills | Status: DC

## 2024-02-09 ENCOUNTER — Other Ambulatory Visit (HOSPITAL_COMMUNITY): Payer: Self-pay

## 2024-02-12 ENCOUNTER — Telehealth: Payer: Self-pay

## 2024-02-12 NOTE — Telephone Encounter (Signed)
See below, before I call back. Is that a side effect of the Trazadone?  Copied from CRM [id number]. Topic: Clinical - Medical Advice >> Feb 12, 2024  8:46 AM Isabell A wrote: Reason for CRM: Reason for CRM: Daughter Thompson Grayer calling in regard to patients medication - states she wasn't there for his last visit and would like to discuss his medications. Thompson Grayer is requesting  to talk to someone about his med list, states traZODone (DESYREL) 100 MG tablet makes his eyes very watery and droopy - wants to confirm if this is a side effect.   Callback number: [phone]

## 2024-02-12 NOTE — Telephone Encounter (Signed)
It can cause eye itching which could cause him to rub eys which could cause watery and droopy- if he has not had recent eye exam I would recommend that to make sure no significant vision changes (small chance of glaucoma)

## 2024-02-13 NOTE — Telephone Encounter (Signed)
Called and lm for [employer], please relay below message to [place] when she returns call.

## 2024-02-15 ENCOUNTER — Other Ambulatory Visit (HOSPITAL_COMMUNITY): Payer: Self-pay

## 2024-04-04 ENCOUNTER — Telehealth: Payer: Self-pay

## 2024-04-04 NOTE — Telephone Encounter (Signed)
 FYI.  Copied from CRM [id number]. Topic: Clinical - Medical Advice >> Apr 04, 2024  9:07 AM Florestine Avers wrote: Reason for CRM: Patient daughter Doran Stabler called in on behalf of the patient. She states that his other daughter (who she did not name) thinks that the patients medication is causing him to have violent tendencies. The Patients other daughter is coming with the patient to his appointment tomorrow, and she does not give her permission to request any change to his medication. Lisa Roca states she is the power of attorney and that Dr, Durene Cal is ok to just listen to what the other sister has to say but she does not want him to change the patients medication UNLESS HE RECCOMENDS IT. Thompson Grayer states that if Dr. Durene Cal has any question to please reach out to her at [phone].

## 2024-04-05 ENCOUNTER — Encounter: Payer: Self-pay | Admitting: Family Medicine

## 2024-04-05 ENCOUNTER — Ambulatory Visit (INDEPENDENT_AMBULATORY_CARE_PROVIDER_SITE_OTHER): Admitting: Family Medicine

## 2024-04-05 VITALS — BP 130/60 | HR 69 | Temp 97.2°F | Ht 69.0 in | Wt 210.8 lb

## 2024-04-05 DIAGNOSIS — I700 Atherosclerosis of aorta: Secondary | ICD-10-CM

## 2024-04-05 DIAGNOSIS — I10 Essential (primary) hypertension: Secondary | ICD-10-CM | POA: Diagnosis not present

## 2024-04-05 DIAGNOSIS — R059 Cough, unspecified: Secondary | ICD-10-CM | POA: Diagnosis not present

## 2024-04-05 DIAGNOSIS — F03C0 Unspecified dementia, severe, without behavioral disturbance, psychotic disturbance, mood disturbance, and anxiety: Secondary | ICD-10-CM

## 2024-04-05 DIAGNOSIS — E785 Hyperlipidemia, unspecified: Secondary | ICD-10-CM

## 2024-04-05 LAB — POC COVID19 BINAXNOW: SARS Coronavirus 2 Ag: NEGATIVE

## 2024-04-05 NOTE — Progress Notes (Signed)
 Phone [phone] In person visit   Subjective:   Jerry [name] is a 88 y.o. year old very pleasant male patient who presents for/with See problem oriented charting Chief Complaint  Patient presents with   Cough    Pt c/o cough and congestion that started Saturday.  Review telephone encounter from yesterday before going in room.    Past Medical History-  Patient Active Problem List   Diagnosis Date Noted   History of lung cancer 09/18/2017    Priority: High   Severe dementia (HCC) 07/17/2015    Priority: High   CKD (chronic kidney disease), stage III (HCC) 02/08/2017    Priority: Medium    Hyperlipidemia 10/15/2014    Priority: Medium    Gout of big toe 08/04/2008    Priority: Medium    Essential hypertension 07/25/2007    Priority: Medium    PROSTATE CANCER, HX OF 07/25/2007    Priority: Medium    Aortic atherosclerosis (HCC) 01/06/2018    Priority: Low   Pulmonary emphysema (HCC)     Priority: Low   Parotiditis 02/08/2017    Priority: Low   Allergic rhinitis 10/15/2014    Priority: Low   Overactive bladder 10/15/2014    Priority: Low   CARDIAC MURMUR 01/10/2011    Priority: Low   ORGANIC IMPOTENCE 08/12/2010    Priority: Low   Urinary incontinence 08/02/2022    Medications- reviewed and updated Current Outpatient Medications  Medication Sig Dispense Refill   amLODipine-olmesartan (AZOR) 5-40 MG tablet Take 1 tablet by mouth daily. 90 tablet 3   busPIRone (BUSPAR) 5 MG tablet Take 1 tablet (5 mg total) by mouth 2 (two) times daily. 60 tablet 5   diclofenac Sodium (VOLTAREN) 1 % GEL Apply 2 g topically 4 (four) times daily. 100 g 3   donepezil (ARICEPT) 10 MG tablet Take 1 tablet (10 mg total) by mouth daily. 90 tablet PRN   ferrous sulfate 325 (65 FE) MG tablet Take three times a week 40 tablet 3   Multiple Vitamin (MULTIVITAMIN) tablet Take 1 tablet by mouth daily. Centrum silver     rosuvastatin (CRESTOR) 20 MG tablet Take 1 tablet (20 mg total) by  mouth daily. 90 tablet PRN   traZODone (DESYREL) 100 MG tablet Take 1 tablet (100 mg total) by mouth at bedtime. 90 tablet 3   No current facility-administered medications for this visit.     Objective:  BP 130/60   Pulse 69   Temp (!) 97.2 F (36.2 C)   Ht 5\' 9"  (1.753 m)   Wt 210 lb 12.8 oz (95.6 kg)   SpO2 97%   BMI 31.13 kg/m  Gen: NAD, resting comfortably Moist mucus membranes, oropharynx normal CV: RRR no murmurs rubs or gallops Lungs: CTAB no crackles, wheeze, rhonchi Abdomen: soft/nontender/nondistended/normal bowel sounds. No rebound or guarding.  Ext: no edema Skin: warm, dry Neuro: defers to daughter   Results for orders placed or performed in visit on 04/05/24 (from the past 24 hours)  POC COVID-19     Status: None   Collection Time: 04/05/24  2:26 PM  Result Value Ref Range   SARS Coronavirus 2 Ag Negative Negative       Assessment and Plan   # cough/chest congestion S: Patient started with cough and congestion on Saturday. Daughter Malachi Bonds noted cough on Saturday, Wednesday and today. More cough on Saturday and Wednesday - seems better today. Sounded like chest congestion -No fever or shortness of breath.  No body  aches -some concern from staff that could be allergies -no runny nose or sinus pressure.  A/P: cough has improved as of today and COVID-negative today- lungs are clear- will continue to monitor may have had URI  #History of lung cancer-annual imaging with Dr. Ozzie Hoyle recently 01/12/24  And was stable thankfully  #Dementia-lives in [place] [facility] which is secured and he has good support there # Agitation/insomnia S: Medication: Donepezil 10 mg (didn't tolerate namenda), buspirone 5 mg twice daily for agitation as well as trazodone 100 mg for sleep-tends to wander without this  -Belsomra and doxepin too costly as well as ramelteon   No longer reporting wandering at night- seems to have had improvement on trazodone 100mg  A/P: dementia ongoing  issues- continue donepezil     #hypertension S: medication: Amlodipine-olmesartan 5-40 mg BP Readings from Last 3 Encounters:  04/05/24 130/60  01/18/24 (!) 143/81  01/04/24 106/80  A/P: blood pressure well controlled continue current medications   #hyperlipidemia #Aortic atherosclerosis S: Medication: Rosuvastatin 20 mg Lab Results  Component Value Date   CHOL 131 12/29/2022   HDL 33.10 (L) 12/29/2022   LDLCALC 74 12/29/2022   LDLDIRECT 58.0 09/09/2021   TRIG 119.0 12/29/2022   CHOLHDL 4 12/29/2022   A/P: lipids hair above ideal goal on last check- continue current medications  Aortic atherosclerosis (presumed stable)- LDL goal ideally <70- hair above goal- check next labs  Recommended follow up: Return for as needed for new, worsening, persistent symptoms. Future Appointments  Date Time Provider Department Center  01/03/2025 11:00 AM Shelva Majestic, MD [facility]  01/13/2025 11:30 AM Antony Blackbird, MD [facility] None   Lab/Order associations:   ICD-10-CM   1. Cough, unspecified type  R05.9 POC COVID-19    2. Severe dementia without behavioral disturbance, psychotic disturbance, mood disturbance, or anxiety, unspecified dementia type (HCC)  F03.C0     3. Essential hypertension  I10     4. Hyperlipidemia, unspecified hyperlipidemia type  E78.5     5. Aortic atherosclerosis (HCC)  I70.0      No orders of the defined types were placed in this encounter.  Return precautions advised.  Tana Conch, MD

## 2024-04-05 NOTE — Patient Instructions (Addendum)
 Hold steady on dose  Glad better from upper respiratory infection (URI)   Recommended follow up: Return for as needed for new, worsening, persistent symptoms.

## 2024-05-27 ENCOUNTER — Other Ambulatory Visit: Payer: Self-pay

## 2024-05-27 ENCOUNTER — Telehealth: Payer: Self-pay

## 2024-05-27 NOTE — Telephone Encounter (Signed)
 Please document per shift any behavioral issues for the next week

## 2024-05-27 NOTE — Telephone Encounter (Signed)
 I received the below message from Jona Negro at [facility]:  Good Morning, Jenel Mires!!!  We are experiencing some behavioral issues with Mr. Tallman. I know Art Bigness is not going to allow any medication changes, as she already thinks we have him overmedicated. I just need an order to document his behaviors per shift. Please help me!

## 2024-05-28 NOTE — Telephone Encounter (Signed)
 Called [employer] to speak with Jona Negro and give Verbal order for documentation of patients behavior. Was sent to VM but unable to leave message.

## 2024-05-28 NOTE — Telephone Encounter (Signed)
 Call to facility for verbal order

## 2024-06-13 ENCOUNTER — Encounter: Payer: Self-pay | Admitting: Pharmacist

## 2024-06-13 NOTE — Progress Notes (Signed)
 Pharmacy Quality Measure Review  This patient is appearing on a report for being at risk of failing the adherence measure for cholesterol (statin) and hypertension (ACEi/ARB) medications this calendar year.   Medication: rosuvastatin   20mg   Last fill date: 03/11/2024 for 28 day supply   Medication: amlodipine  / olmesartan  5/40mg  Last fill date: 03/11/2024 for 28 day supply - per adherence report  Reviewed refill history in Dr Anson Basta database - patient has actually had both medications filled for 28 day supply on 04/08/2024 and 05/06/2024  Insurance report was not up to date. No action needed at this time.   Cecilie Coffee, PharmD Clinical Pharmacist [facility]  Population Health [phone]

## 2024-06-14 ENCOUNTER — Ambulatory Visit: Payer: Self-pay

## 2024-06-14 NOTE — Telephone Encounter (Signed)
 FYI Only or Action Required?: FYI only for provider.  Patient was last seen in primary care on 04/05/2024 by Almira Jaeger, MD. Called Nurse Triage reporting Constipation and Rectal Pain. Symptoms began several days ago. Interventions attempted: Nothing. Symptoms are: constipation, lump near rectum, rectum sore unchanged.  Triage Disposition: See PCP When Office is Open (Within 3 Days) (overriding Home Care)  Patient/caregiver understands and will follow disposition?: Yes                         Copied from CRM [id number]. Topic: Clinical - Red Word Triage >> Jun 14, 2024 10:09 AM Howard Macho wrote: Red Word that prompted transfer to Nurse Triage:patient daughter called stating the patient was having loose stool and diarrhea on Saturday night going into Sunday morning and the patient daughter stated his rectum was sore on Sunday morning. Patient was in the back ground stating it is a big lump in his rectum and the patient daughter stated he is not going as much. When the patient wipes it is a smear on the toilet paper  Reason for Disposition  Mild rectal pain  Answer Assessment - Initial Assessment Questions 1. SYMPTOM:  What's the main symptom you're concerned about? (e.g., pain, itching, swelling, rash)     Lump at rectum, rectum sore.  2. ONSET: When did the symptoms  start?     Sunday.  3. RECTAL PAIN: Do you have any pain around your rectum? How bad is the pain?  (Scale 0-10; or mild, moderate, severe)   - NONE (0): no pain   - MILD (1-3): doesn't interfere with normal activities    - MODERATE (4-7): interferes with normal activities or awakens from sleep, limping    - SEVERE (8-10): excruciating pain, unable to have a bowel movement      Unsure.  4. RECTAL ITCHING: Do you have any itching in this area? How bad is the itching?  (Scale 0-10; or mild, moderate, severe)   - NONE: no itching   - MILD: doesn't interfere with normal activities    -  MODERATE-SEVERE: interferes with normal activities or awakens from sleep     No.  5. CONSTIPATION: Do you have constipation? If Yes, ask: How bad is it?     Unsure, daughter states he is using the bathroom and then going back in like he needs to go again. She states there is no diarrhea but just smears on the toilet tissue and BM is small (size of a quarter).  6. CAUSE: What do you think is causing the anus symptoms?     Unsure.  7. OTHER SYMPTOMS: Do you have any other symptoms?  (e.g., abdomen pain, fever, rectal bleeding, vomiting)     Diarrhea (Saturday night/Sunday morning)  8. PREGNANCY: Is there any chance you are pregnant? When was your last menstrual period?     N/A.  Protocols used: Rectal Symptoms-A-AH

## 2024-06-14 NOTE — Telephone Encounter (Signed)
 Call dropped while talking to the patient's daughter attempted to call back without success. Routing for further attempts.   Copied from CRM [id number]. Topic: Clinical - Red Word Triage >> Jun 14, 2024 10:09 AM Howard Macho wrote: Red Word that prompted transfer to Nurse Triage:patient daughter called stating the patient was having loose stool and diarrhea on Saturday night going into Sunday morning and the patient daughter stated his rectum was sore on Sunday morning. Patient was in the back ground stating it is a big lump in his rectum and the patient daughter stated he is not going as much. When the patient wipes it is a smear on the toilet paper

## 2024-06-17 ENCOUNTER — Ambulatory Visit (INDEPENDENT_AMBULATORY_CARE_PROVIDER_SITE_OTHER): Admitting: Family Medicine

## 2024-06-17 ENCOUNTER — Encounter: Payer: Self-pay | Admitting: Family Medicine

## 2024-06-17 ENCOUNTER — Ambulatory Visit (INDEPENDENT_AMBULATORY_CARE_PROVIDER_SITE_OTHER)

## 2024-06-17 ENCOUNTER — Ambulatory Visit: Payer: Self-pay | Admitting: Family Medicine

## 2024-06-17 VITALS — BP 138/64 | HR 71 | Temp 97.7°F | Ht 69.0 in | Wt 204.8 lb

## 2024-06-17 DIAGNOSIS — R152 Fecal urgency: Secondary | ICD-10-CM | POA: Diagnosis not present

## 2024-06-17 DIAGNOSIS — K5900 Constipation, unspecified: Secondary | ICD-10-CM | POA: Diagnosis not present

## 2024-06-17 DIAGNOSIS — I10 Essential (primary) hypertension: Secondary | ICD-10-CM

## 2024-06-17 DIAGNOSIS — F03C0 Unspecified dementia, severe, without behavioral disturbance, psychotic disturbance, mood disturbance, and anxiety: Secondary | ICD-10-CM | POA: Diagnosis not present

## 2024-06-17 DIAGNOSIS — N2889 Other specified disorders of kidney and ureter: Secondary | ICD-10-CM | POA: Diagnosis not present

## 2024-06-17 DIAGNOSIS — R109 Unspecified abdominal pain: Secondary | ICD-10-CM | POA: Diagnosis not present

## 2024-06-17 NOTE — Progress Notes (Signed)
   Jerry [name] is a 88 y.o. male who presents today for an office visit.  Assessment/Plan:  New/Acute Problems: Fecal Urgency  History difficult due to his dementia though sounds like he is probably constipated. Sounds like he is also getting thorazine at his assisted living facility which may be contributing to the constipation.  Due to limited history we will check KUB to further assess constipation burden.   Recommend starting daily MiraLAX.  Also recommended stool softeners.  Encourage hydration.  They will come back soon to discuss with PCP and any other medication changes that may need to be made though it does not look like the Thorazine was on his medication list-will add this today.  Daughter wanted us  to perform a rectal exam today to assess for hemorrhoids however patient was not agreeable to this and thus this was deferred.  It is okay for them to use over-the-counter hemorrhoid cream if needed. We discussed reasons to return to care.   Chronic Problems Addressed Today: Essential Hypertension  At goal today on amlodipine  olmesartan  5-40 once daily.   Dementia  On donepazil but was started on thorazine at his ALF.  This may be contributing to his above constipation issues.  Sounds like he is having some more agitation issues as well.  He will come back soon to follow-up with his PCP regarding ongoing management for this.    Subjective:  HPI:  See Assessment / plan for status of chronic conditions. Patient here today with fecal urgency. This has been going for about a week or so. History is limited due to his severe dementia. History provided by patient's daughter. He is attempting to have a bowel movement multiple times per day however when he sits on the toilet not much comes out. There was concern for diarrhea last week and he took and over counter medication but they do not know what it was. He is currently living in assisted living and they recently started him on thorazine  due to agitation.        Objective:  Physical Exam: BP 138/64   Pulse 71   Temp 97.7 F (36.5 C) (Temporal)   Ht 5' 9 (1.753 m)   Wt 204 lb 12.8 oz (92.9 kg)   SpO2 97%   BMI 30.24 kg/m   Gen: No acute distress, resting comfortably CV: Regular rate and rhythm with no murmurs appreciated Pulm: Normal work of breathing, clear to auscultation bilaterally with no crackles, wheezes, or rhonchi Neuro: Grossly normal, moves all extremities Psych: Normal affect  Time Spent: 50 minutes of total time was spent on the date of the encounter performing the following actions: chart review prior to seeing the patient, obtaining history, performing a medically necessary exam, counseling on the treatment plan, placing orders, and documenting in our EHR.       Worth HERO. Kennyth, MD 06/17/2024 10:17 AM

## 2024-06-17 NOTE — Progress Notes (Signed)
 His x-ray shows moderate amount of constipation.  Recommend bowel regimen as we discussed at his office visit with MiraLAX daily.  There is a small calcification near his kidney which may be a kidney stone however I do not think that this is contributing to his symptoms.  They can discuss further with your primary care physician at the next follow-up visit.

## 2024-06-17 NOTE — Patient Instructions (Signed)
 It was very nice to see you today!  I think he probably has constipation.  Please start MiraLAX once or twice daily.  Will check an x-ray today to assess for constipation.  Please come back to see him to discuss with Dr. Katrinka any other medication changes.  Return if symptoms worsen or fail to improve.   Take care, Dr Kennyth  PLEASE NOTE:  If you had any lab tests, please let us  know if you have not heard back within a few days. You may see your results on mychart before we have a chance to review them but we will give you a call once they are reviewed by us .   If we ordered any referrals today, please let us  know if you have not heard from their office within the next week.   If you had any urgent prescriptions sent in today, please check with the pharmacy within an hour of our visit to make sure the prescription was transmitted appropriately.   Please try these tips to maintain a healthy lifestyle:  Eat at least 3 REAL meals and 1-2 snacks per day.  Aim for no more than 5 hours between eating.  If you eat breakfast, please do so within one hour of getting up.   Each meal should contain half fruits/vegetables, one quarter protein, and one quarter carbs (no bigger than a computer mouse)  Cut down on sweet beverages. This includes juice, soda, and sweet tea.   Drink at least 1 glass of water with each meal and aim for at least 8 glasses per day  Exercise at least 150 minutes every week.

## 2024-06-20 ENCOUNTER — Telehealth: Payer: Self-pay

## 2024-06-20 NOTE — Telephone Encounter (Signed)
 From has been faxed to [facility].  Copied from CRM [id number]. Topic: General - Other >> Jun 19, 2024  9:18 AM Rosina BIRCH wrote: Reason for RMF:rjmfnw from [facility] called stating she sent over a diet order five times and have not received it back. Carmon stated the first one was sent on 6/5 and then she sent one every week since then. Carmon is sending another fax today CB [phone]

## 2024-06-27 ENCOUNTER — Telehealth: Payer: Self-pay | Admitting: *Deleted

## 2024-06-27 NOTE — Telephone Encounter (Signed)
 Copied from CRM [id number]. Topic: Clinical - Lab/Test Results >> Jun 24, 2024 12:57 PM Shereese L wrote: Reason for CRM: patient daughter is calling to find out the result of her dad xra done on 06/23. Please call daughter meade [phone].  Spoke to [place] and advised per Dr. Kennyth:   His x-ray shows moderate amount of constipation. Recommend bowel regimen as we discussed at his office visit with MiraLAX daily. There is a small calcification near his kidney which may be a kidney stone however I do not think that this is contributing to his symptoms. They can discuss further with your primary care physician at the next follow-up visit.   meade verbalized understanding and stated she will check with facility first and call back to schedule follow-up with Dr. Katrinka.

## 2024-07-15 ENCOUNTER — Telehealth: Payer: Self-pay | Admitting: Family Medicine

## 2024-07-15 NOTE — Telephone Encounter (Signed)
 [facility] faxed document FL2, to be filled out by provider. Patient requested to send it back via Fax within ASAP. Document is located in providers tray at front office.Please advise at [phone] Hilarie Lesches phone #).

## 2024-07-17 NOTE — Telephone Encounter (Signed)
Form in your sign folder

## 2024-07-17 NOTE — Telephone Encounter (Signed)
Back on your desk.

## 2024-07-17 NOTE — Telephone Encounter (Signed)
 Form completed and faxed back

## 2024-07-22 ENCOUNTER — Telehealth: Payer: Self-pay | Admitting: Family Medicine

## 2024-07-22 NOTE — Telephone Encounter (Unsigned)
 Copied from CRM [id number]. Topic: Clinical - Medication Question >> Jul 22, 2024 10:46 AM Deleta RAMAN wrote: Reason for CRM: patient daughter is calling to speak with pcp about medication increase on Trazodone  100 mg that is causing patient to be a  zombie he is not as active or function as normally. Patient is having more restful night, but sedated through out the day. Patient normally would have 50 mg. His daughter is requesting conference call or telehealth visit. Something over the phone due to her location. Please give her a call at [phone] in regards to concerns. >> Jul 22, 2024 10:57 AM Deleta RAMAN wrote: She also would like to know list of medication

## 2024-07-22 NOTE — Telephone Encounter (Signed)
 Recommend they go back to 50 mg nightly and schedule an appointment with Dr Katrinka soon.  Worth HERO. Kennyth, MD 07/22/2024 3:11 PM

## 2024-07-23 NOTE — Telephone Encounter (Signed)
 Left message to return call to our office at their convenience.

## 2024-07-23 NOTE — Telephone Encounter (Signed)
 Patient daughter will like to schedule an appointment with Dr Katrinka for medication changes  Please schedule an appt soon

## 2024-07-31 NOTE — Telephone Encounter (Signed)
 Called and lm for [place] at [employer].

## 2024-07-31 NOTE — Telephone Encounter (Signed)
 See below.   Copied from CRM #[id number]. Topic: Clinical - Prescription Issue >> Jul 31, 2024 11:02 AM Deleta RAMAN wrote: Reason for CRM: patient is supposed to take constipation medication daily. He does not have a prescription for this medication and his daughter would like for a prescription to be issues. Please make prn instead of daily. Please call [phone] if needed.

## 2024-07-31 NOTE — Telephone Encounter (Signed)
 Which medication is he currently taking?  Has it been helpful?  If so I can write for that 1

## 2024-08-06 ENCOUNTER — Ambulatory Visit: Admitting: Family Medicine

## 2024-08-13 ENCOUNTER — Ambulatory Visit (INDEPENDENT_AMBULATORY_CARE_PROVIDER_SITE_OTHER): Admitting: Family Medicine

## 2024-08-13 ENCOUNTER — Ambulatory Visit: Payer: Self-pay | Admitting: Family Medicine

## 2024-08-13 ENCOUNTER — Encounter: Payer: Self-pay | Admitting: Family Medicine

## 2024-08-13 VITALS — BP 118/68 | HR 67 | Temp 97.9°F | Ht 69.0 in | Wt 207.6 lb

## 2024-08-13 DIAGNOSIS — Z8546 Personal history of malignant neoplasm of prostate: Secondary | ICD-10-CM | POA: Diagnosis not present

## 2024-08-13 DIAGNOSIS — E785 Hyperlipidemia, unspecified: Secondary | ICD-10-CM | POA: Diagnosis not present

## 2024-08-13 DIAGNOSIS — I10 Essential (primary) hypertension: Secondary | ICD-10-CM | POA: Diagnosis not present

## 2024-08-13 DIAGNOSIS — Z131 Encounter for screening for diabetes mellitus: Secondary | ICD-10-CM | POA: Diagnosis not present

## 2024-08-13 DIAGNOSIS — R7303 Prediabetes: Secondary | ICD-10-CM

## 2024-08-13 LAB — CBC WITH DIFFERENTIAL/PLATELET
Basophils Absolute: 0.1 K/uL (ref 0.0–0.1)
Basophils Relative: 0.7 % (ref 0.0–3.0)
Eosinophils Absolute: 0.1 K/uL (ref 0.0–0.7)
Eosinophils Relative: 1.6 % (ref 0.0–5.0)
HCT: 41.5 % (ref 39.0–52.0)
Hemoglobin: 13.5 g/dL (ref 13.0–17.0)
Lymphocytes Relative: 27.6 % (ref 12.0–46.0)
Lymphs Abs: 2.1 K/uL (ref 0.7–4.0)
MCHC: 32.6 g/dL (ref 30.0–36.0)
MCV: 91.9 fl (ref 78.0–100.0)
Monocytes Absolute: 0.4 K/uL (ref 0.1–1.0)
Monocytes Relative: 5.1 % (ref 3.0–12.0)
Neutro Abs: 4.9 K/uL (ref 1.4–7.7)
Neutrophils Relative %: 65 % (ref 43.0–77.0)
Platelets: 182 K/uL (ref 150.0–400.0)
RBC: 4.52 Mil/uL (ref 4.22–5.81)
RDW: 14.7 % (ref 11.5–15.5)
WBC: 7.6 K/uL (ref 4.0–10.5)

## 2024-08-13 LAB — COMPREHENSIVE METABOLIC PANEL WITH GFR
ALT: 10 U/L (ref 0–53)
AST: 13 U/L (ref 0–37)
Albumin: 3.7 g/dL (ref 3.5–5.2)
Alkaline Phosphatase: 52 U/L (ref 39–117)
BUN: 11 mg/dL (ref 6–23)
CO2: 28 meq/L (ref 19–32)
Calcium: 8.6 mg/dL (ref 8.4–10.5)
Chloride: 112 meq/L (ref 96–112)
Creatinine, Ser: 1.39 mg/dL (ref 0.40–1.50)
GFR: 46.04 mL/min — ABNORMAL LOW (ref >60.00)
Glucose, Bld: 86 mg/dL (ref 70–99)
Potassium: 4.4 meq/L (ref 3.5–5.1)
Sodium: 144 meq/L (ref 135–145)
Total Bilirubin: 0.4 mg/dL (ref 0.2–1.2)
Total Protein: 6.2 g/dL (ref 6.0–8.3)

## 2024-08-13 LAB — PSA: PSA: 0.35 ng/mL (ref 0.10–4.00)

## 2024-08-13 LAB — LIPID PANEL
Cholesterol: 130 mg/dL (ref 0–200)
HDL: 37.8 mg/dL — ABNORMAL LOW (ref >39.00)
LDL Cholesterol: 66 mg/dL (ref 0–99)
NonHDL: 92.19
Total CHOL/HDL Ratio: 3
Triglycerides: 132 mg/dL (ref 0.0–149.0)
VLDL: 26.4 mg/dL (ref 0.0–40.0)

## 2024-08-13 LAB — TSH: TSH: 0.99 u[IU]/mL (ref 0.35–5.50)

## 2024-08-13 LAB — HEMOGLOBIN A1C: Hgb A1c MFr Bld: 5.8 % (ref 4.6–6.5)

## 2024-08-13 NOTE — Patient Instructions (Addendum)
 Please stop by lab before you go If you have mychart- we will send your results within 3 business days of us  receiving them.  If you do not have mychart- we will call you about results within 5 business days of us  receiving them.  *please also note that you will see labs on mychart as soon as they post. I will later go in and write notes on them- will say notes from Dr. Katrinka   Options to trial: Trazodone  100 mg- take just half tablet. If he does well with this and seems more alert in nighttime - let me know and I can even send in a 50 mg tablet and you can try half of that- we can try to wean off once he's back in home environment essentially If does well with this we could also consider reducing buspirone  but lets take one step at a time.  Create as much routine as possible in new environment and first few days or weeks may be more challenging. Consider cameras to monitor as well as alarms.   Recommended follow up: Return for next already scheduled visit or sooner if needed.

## 2024-08-13 NOTE — Progress Notes (Signed)
 Phone [phone] In person visit   Subjective:   Jerry [name] is a 88 y.o. year old very pleasant male patient who presents for/with See problem oriented charting Chief Complaint  Patient presents with   Medical Management of Chronic Issues    Would like to speak about lowering trazodone     Past Medical History-  Patient Active Problem List   Diagnosis Date Noted   History of lung cancer 09/18/2017    Priority: High   Severe dementia (HCC) 07/17/2015    Priority: High   CKD (chronic kidney disease), stage III (HCC) 02/08/2017    Priority: Medium    Hyperlipidemia 10/15/2014    Priority: Medium    Gout of big toe 08/04/2008    Priority: Medium    Essential hypertension 07/25/2007    Priority: Medium    PROSTATE CANCER, HX OF 07/25/2007    Priority: Medium    Aortic atherosclerosis (HCC) 01/06/2018    Priority: Low   Pulmonary emphysema (HCC)     Priority: Low   Parotiditis 02/08/2017    Priority: Low   Allergic rhinitis 10/15/2014    Priority: Low   Overactive bladder 10/15/2014    Priority: Low   CARDIAC MURMUR 01/10/2011    Priority: Low   ORGANIC IMPOTENCE 08/12/2010    Priority: Low   Urinary incontinence 08/02/2022    Medications- reviewed and updated Current Outpatient Medications  Medication Sig Dispense Refill   amLODipine -olmesartan  (AZOR ) 5-40 MG tablet Take 1 tablet by mouth daily. 90 tablet 3   busPIRone  (BUSPAR ) 5 MG tablet Take 1 tablet (5 mg total) by mouth 2 (two) times daily. 60 tablet 5   donepezil  (ARICEPT ) 10 MG tablet Take 1 tablet (10 mg total) by mouth daily. 90 tablet PRN   ferrous sulfate  325 (65 FE) MG tablet Take three times a week 40 tablet 3   Multiple Vitamin (MULTIVITAMIN) tablet Take 1 tablet by mouth daily. Centrum silver     rosuvastatin  (CRESTOR ) 20 MG tablet Take 1 tablet (20 mg total) by mouth daily. 90 tablet PRN   traZODone  (DESYREL ) 100 MG tablet Take 1 tablet (100 mg total) by mouth at bedtime. 90 tablet 3    diclofenac  Sodium (VOLTAREN ) 1 % GEL Apply 2 g topically 4 (four) times daily. (Patient not taking: Reported on 08/13/2024) 100 g 3   No current facility-administered medications for this visit.     Objective:  BP 118/68 (BP Location: Left Arm, Patient Position: Sitting, Cuff Size: Normal)   Pulse 67   Temp 97.9 F (36.6 C) (Temporal)   Ht 5' 9 (1.753 m)   Wt 207 lb 9.6 oz (94.2 kg)   SpO2 98%   BMI 30.66 kg/m  Gen: NAD, resting comfortably CV: RRR no murmurs rubs or gallops Lungs: CTAB no crackles, wheeze, rhonchi Ext: no edema Skin: warm, dry Neuro: Patient with appropriate responses but looks to daughter for most answers.  He does closes eyes and fall asleep during the visit    Assessment and Plan   #social update- his daughter at visit today. May have to move out of [facility] for financial reasons within the next month.  Local daughter is caring for 2 of her daughters so he will likely need to move out of state at least short-term  #History of lung cancer-annual imaging with Dr. Loreatha recently 01/12/24   And was stable  #History of prostate cancer-follows with [facility] urology yearly in the past- has not been in sometime -  we will check psa Lab Results  Component Value Date   PSA 0.20 03/15/2022   PSA 0.20 09/09/2021   PSA 0.11 10/12/2020   #Dementia-lives in [facility] which is secured and he has good support there # Agitation/insomnia S: Medication: Donepezil  10 mg, buspirone  5 mg twice daily for agitation as well as trazodone  100 mg for sleep-tends to wander without this at [facility] but has above he will be moving out of her house.  One of his daughters feels that he is too sedated in the daytime and wonders if medications could be the cause -Belsomra  and doxepin  too costly as well as ramelteon    -Did not tolerate Namenda  due to dizziness -Not currently seeing neurology anymore  A/P: Dementia continues to progress.  Agitation seems to be  well-controlled as well as insomnia but they are worried he could be oversedated -he will be moving into their home out of state and we gave some options to trial  Options to trial: Trazodone  100 mg- take just half tablet. If he does well with this and seems more alert in nighttime - let me know and I can even send in a 50 mg tablet and you can try half of that- we can try to wean off once he's back in home environment essentially If does well with this we could also consider reducing buspirone  but lets take one step at a time.  Create as much routine as possible in new environment and first few days or weeks may be more challenging. Consider cameras to monitor as well as alarms.    #hypertension S: medication: Amlodipine -olmesartan  5-40 mg BP Readings from Last 3 Encounters:  08/13/24 118/68  06/17/24 138/64  04/05/24 130/60  A/P: well controlled continue current medications   #hyperlipidemia S: Medication: Rosuvastatin  20 mg Lab Results  Component Value Date   CHOL 131 12/29/2022   HDL 33.10 (L) 12/29/2022   LDLCALC 74 12/29/2022   LDLDIRECT 58.0 09/09/2021   TRIG 119.0 12/29/2022   CHOLHDL 4 12/29/2022   A/P: hopefully stable- update lipid panel today. Continue current meds for now    Recommended follow up: Return for next already scheduled visit or sooner if needed. Future Appointments  Date Time Provider Department Center  01/03/2025 11:00 AM Katrinka Garnette KIDD, MD [facility]  01/13/2025 11:30 AM Shannon Agent, MD [facility] None    Lab/Order associations:   ICD-10-CM   1. Essential hypertension  I10 Comprehensive metabolic panel with GFR    CBC with Differential/Platelet    Lipid panel    TSH    2. Hyperlipidemia, unspecified hyperlipidemia type  E78.5 Comprehensive metabolic panel with GFR    CBC with Differential/Platelet    Lipid panel    TSH    3. Screening for diabetes mellitus  Z13.1 Hemoglobin A1c    4. Prediabetes  R73.03 Hemoglobin A1c    5. Personal  history of malignant neoplasm of prostate  Z85.46 PSA      No orders of the defined types were placed in this encounter.   Return precautions advised.  Garnette Katrinka, MD

## 2024-08-21 ENCOUNTER — Ambulatory Visit: Payer: Self-pay | Admitting: Family Medicine

## 2024-08-21 MED ORDER — BENZONATATE 100 MG PO CAPS: 100.0000 mg | ORAL_CAPSULE | Freq: Two times a day (BID) | ORAL | 0 refills | Status: DC | PRN

## 2024-08-21 NOTE — Addendum Note (Signed)
 Addended by: KATRINKA GARNETTE KIDD on: 08/21/2024 01:48 PM   Modules accepted: Orders

## 2024-08-21 NOTE — Telephone Encounter (Signed)
 Also if over 10 days of symptoms or worsens have him return to care

## 2024-08-21 NOTE — Telephone Encounter (Signed)
 [place], RRC at [employer] pt had a semi productive cough on yesterday. States she has not seen him cough today but is requesting cough medication for the pt.

## 2024-08-21 NOTE — Telephone Encounter (Signed)
 Please see message and advise

## 2024-08-21 NOTE — Telephone Encounter (Signed)
 FYI Only or Action Required?: Action required by provider: medicaiton .  Patient was last seen in primary care on 08/13/2024 by Katrinka Garnette KIDD, MD.  Called Nurse Triage reporting Cough.  Symptoms began several days ago.  Interventions attempted: Nothing.  Symptoms are: gradually improving.  Triage Disposition: Home Care  Patient/caregiver understands and will follow disposition?: No, wishes to speak with PCP   Reason for Disposition  Cough  Answer Assessment - Initial Assessment Questions 1. ONSET: When did the cough begin?      Monday 2. SEVERITY: How bad is the cough today?      mild 3. SPUTUM: Describe the color of your sputum (e.g., none, dry cough; clear, white, yellow, green)     Clear but not often 4. HEMOPTYSIS: Are you coughing up any blood? If Yes, ask: How much? (e.g., flecks, streaks, tablespoons, etc.)     no 5. DIFFICULTY BREATHING: Are you having difficulty breathing? If Yes, ask: How bad is it? (e.g., mild, moderate, severe)      no 6. FEVER: Do you have a fever? If Yes, ask: What is your temperature, how was it measured, and when did it start?     no 7. CARDIAC HISTORY: Do you have any history of heart disease? (e.g., heart attack, congestive heart failure)      no 8. LUNG HISTORY: Do you have any history of lung disease?  (e.g., pulmonary embolus, asthma, emphysema)     no 9. PE RISK FACTORS: Do you have a history of blood clots? (or: recent major surgery, recent prolonged travel, bedridden)     no 10. OTHER SYMPTOMS: Do you have any other symptoms? (e.g., runny nose, wheezing, chest pain)       no  Protocols used: Cough - Acute Productive-A-AH

## 2024-08-21 NOTE — Telephone Encounter (Signed)
 Called [place] [phone] from [facility] back as requested to f/u on patient sx and request for medication. No answer with multiple rings and unable to leave message to call back.    Summary: Cough   Reason for CRM: [place] from [facility] is calling for patient to request medication for a cough he his having. Please contact her back at [phone]

## 2024-08-22 ENCOUNTER — Other Ambulatory Visit: Payer: Self-pay

## 2024-08-22 MED ORDER — BENZONATATE 100 MG PO CAPS: 100.0000 mg | ORAL_CAPSULE | Freq: Two times a day (BID) | ORAL | 0 refills | Status: DC | PRN

## 2024-08-22 NOTE — Telephone Encounter (Signed)
Meds resent to pharmacy.

## 2024-08-29 ENCOUNTER — Other Ambulatory Visit: Payer: Self-pay | Admitting: Family Medicine

## 2024-08-29 DIAGNOSIS — R413 Other amnesia: Secondary | ICD-10-CM

## 2024-08-29 NOTE — Telephone Encounter (Unsigned)
 Copied from CRM [id number]. Topic: Clinical - Medication Refill >> Aug 29, 2024  1:50 PM Ahlexyia S wrote: Medication:  traZODone  (DESYREL ) 100 MG tablet rosuvastatin  (CRESTOR ) 20 MG tablet ferrous sulfate  325 (65 FE) MG tablet donepezil  (ARICEPT ) 10 MG tablet busPIRone  (BUSPAR ) 5 MG tablet benzonatate  (TESSALON  PERLES) 100 MG capsule amLODipine -olmesartan  (AZOR ) 5-40 MG tablet  Has the patient contacted their pharmacy? Yes, [name] is wanting to switch all meds to updated pharmacy. (Agent: If no, request that the patient contact the pharmacy for the refill. If patient does not wish to contact the pharmacy document the reason why and proceed with request.) (Agent: If yes, when and what did the pharmacy advise?)  This is the patient's preferred pharmacy:  [facility] - [place], [place] - [address] [place] [address] Phone: [phone] Fax: [phone]  Is this the correct pharmacy for this prescription? Yes If no, delete pharmacy and type the correct one.   Has the prescription been filled recently? No  Is the patient out of the medication? Yes  Has the patient been seen for an appointment in the last year OR does the patient have an upcoming appointment? Yes  Can we respond through MyChart? Yes  Agent: Please be advised that Rx refills may take up to 3 business days. We ask that you follow-up with your pharmacy.

## 2024-08-30 MED ORDER — TRAZODONE HCL 100 MG PO TABS: 100.0000 mg | ORAL_TABLET | Freq: Every day | ORAL | 3 refills | Status: DC

## 2024-08-30 MED ORDER — ROSUVASTATIN CALCIUM 20 MG PO TABS: 20.0000 mg | ORAL_TABLET | Freq: Every day | ORAL | 99 refills | Status: DC

## 2024-08-30 MED ORDER — DONEPEZIL HCL 10 MG PO TABS: 10.0000 mg | ORAL_TABLET | Freq: Every day | ORAL | 99 refills | Status: DC

## 2024-08-30 MED ORDER — BUSPIRONE HCL 5 MG PO TABS: 5.0000 mg | ORAL_TABLET | Freq: Two times a day (BID) | ORAL | 5 refills | Status: DC

## 2024-08-30 MED ORDER — FERROUS SULFATE 325 (65 FE) MG PO TABS: ORAL_TABLET | ORAL | 3 refills | Status: AC

## 2024-08-30 MED ORDER — AMLODIPINE-OLMESARTAN 5-40 MG PO TABS: 1.0000 | ORAL_TABLET | Freq: Every day | ORAL | 3 refills | Status: DC

## 2024-09-13 ENCOUNTER — Ambulatory Visit: Payer: Self-pay

## 2024-09-13 NOTE — Telephone Encounter (Signed)
 FYI Only or Action Required?: FYI only for provider.  Patient was last seen in primary care on 08/13/2024 by Katrinka Garnette KIDD, MD.  Called Nurse Triage reporting Stool Color Change.  Symptoms began several days ago.  Interventions attempted: Nothing.  Symptoms are: resolved at this time.  Triage Disposition: See PCP Within 2 Weeks  Patient/caregiver understands and will follow disposition?: Unsure      Copied from CRM #[id number]. Topic: Clinical - Red Word Triage >> Sep 13, 2024  3:10 PM Dedra B wrote: [facility] that prompted transfer to Nurse Triage: Pt having black stool. Warm transfer to nurse triage.       Reason for Disposition  Age > 50 years  Answer Assessment - Initial Assessment Questions The patient's daughter reports that the patient had some black bowel movements earlier this week but has not had any today. I have advised that if the black bowel movements return he should go to the ED for evaluation. She is not with the patient at this time and will contact her sister who is with the patient who will call back for scheduling if desired.      1. APPEARANCE of BLOOD: What color is it? Is it passed separately, on the surface of the stool, or mixed in with the stool?      Black stool 2. AMOUNT: How much blood was passed?      Unsure, black stool 3. FREQUENCY: How many times has blood been passed with the stools?      Unsure  4. ONSET: When was the blood first seen in the stools? (Days or weeks)      Sometime this week, no black bowel movements today  5. DIARRHEA: Is there also some diarrhea? If Yes, ask: How many diarrhea stools in the past 24 hours?      No 6. CONSTIPATION: Do you have constipation? If Yes, ask: How bad is it?     No  7. RECURRENT SYMPTOMS: Have you had blood in your stools before? If Yes, ask: When was the last time? and What happened that time?      Yes, history of cancer  8. BLOOD THINNERS: Do you take any blood  thinners? (e.g., aspirin, clopidogrel / Plavix, coumadin, heparin). Notes: Other strong blood thinners include: Arixtra (fondaparinux), Eliquis (apixaban), Pradaxa (dabigatran), and Xarelto (rivaroxaban).     No 9. OTHER SYMPTOMS: Do you have any other symptoms?  (e.g., abdomen pain, vomiting, dizziness, fever)     Not to caller's knowledge  Protocols used: Rectal Bleeding-A-AH

## 2024-09-13 NOTE — Telephone Encounter (Signed)
Please see triage note and advise.  ?

## 2024-09-13 NOTE — Telephone Encounter (Signed)
 I agree with the ED disposition if recurrent neck black stool/melena can be a major concern and can be associated with anemia/blood loss.  Please call and check on him and if he has any more this lets do a CBC under melena at a minimum.  1 other thing to recheck is that he has had Pepto-Bismol recently as that can turn the stool black

## 2024-09-18 NOTE — Telephone Encounter (Signed)
 Called pts daughter x2 and cannot get ahold of her. Goes straight to voicemail and it is full so I cannot leave a message. Will continue to try and reach her.

## 2024-09-27 ENCOUNTER — Other Ambulatory Visit: Payer: Self-pay | Admitting: Family Medicine

## 2024-09-30 NOTE — Telephone Encounter (Signed)
 Left vm for family regarding dr. Katrinka note.

## 2024-10-28 ENCOUNTER — Other Ambulatory Visit: Payer: Self-pay | Admitting: Family Medicine

## 2024-10-28 DIAGNOSIS — R413 Other amnesia: Secondary | ICD-10-CM

## 2024-12-04 ENCOUNTER — Emergency Department (HOSPITAL_COMMUNITY)

## 2024-12-04 ENCOUNTER — Telehealth: Payer: Self-pay | Admitting: Family Medicine

## 2024-12-04 ENCOUNTER — Encounter (HOSPITAL_COMMUNITY): Payer: Self-pay

## 2024-12-04 ENCOUNTER — Inpatient Hospital Stay (HOSPITAL_COMMUNITY)
Admission: EM | Admit: 2024-12-04 | Discharge: 2024-12-07 | DRG: 683 | Disposition: A | Attending: Internal Medicine | Admitting: Internal Medicine

## 2024-12-04 ENCOUNTER — Other Ambulatory Visit: Payer: Self-pay

## 2024-12-04 DIAGNOSIS — E860 Dehydration: Secondary | ICD-10-CM | POA: Diagnosis present

## 2024-12-04 DIAGNOSIS — I10 Essential (primary) hypertension: Secondary | ICD-10-CM | POA: Diagnosis present

## 2024-12-04 DIAGNOSIS — F03C11 Unspecified dementia, severe, with agitation: Secondary | ICD-10-CM

## 2024-12-04 DIAGNOSIS — F03C0 Unspecified dementia, severe, without behavioral disturbance, psychotic disturbance, mood disturbance, and anxiety: Secondary | ICD-10-CM | POA: Diagnosis present

## 2024-12-04 DIAGNOSIS — E870 Hyperosmolality and hypernatremia: Principal | ICD-10-CM

## 2024-12-04 DIAGNOSIS — N183 Chronic kidney disease, stage 3 unspecified: Secondary | ICD-10-CM | POA: Diagnosis present

## 2024-12-04 DIAGNOSIS — E785 Hyperlipidemia, unspecified: Secondary | ICD-10-CM | POA: Diagnosis present

## 2024-12-04 LAB — CBC WITH DIFFERENTIAL/PLATELET
Abs Immature Granulocytes: 0.05 K/uL (ref 0.00–0.07)
Basophils Absolute: 0.1 K/uL (ref 0.0–0.1)
Basophils Relative: 1 %
Eosinophils Absolute: 0.2 K/uL (ref 0.0–0.5)
Eosinophils Relative: 2 %
HCT: 43.6 % (ref 39.0–52.0)
Hemoglobin: 13.9 g/dL (ref 13.0–17.0)
Immature Granulocytes: 1 %
Lymphocytes Relative: 22 %
Lymphs Abs: 2.2 K/uL (ref 0.7–4.0)
MCH: 30.4 pg (ref 26.0–34.0)
MCHC: 31.9 g/dL (ref 30.0–36.0)
MCV: 95.4 fL (ref 80.0–100.0)
Monocytes Absolute: 0.5 K/uL (ref 0.1–1.0)
Monocytes Relative: 5 %
Neutro Abs: 7.1 K/uL (ref 1.7–7.7)
Neutrophils Relative %: 69 %
Platelets: 186 K/uL (ref 150–400)
RBC: 4.57 MIL/uL (ref 4.22–5.81)
RDW: 13.7 % (ref 11.5–15.5)
WBC: 10 K/uL (ref 4.0–10.5)
nRBC: 0 % (ref 0.0–0.2)

## 2024-12-04 LAB — URINALYSIS, ROUTINE W REFLEX MICROSCOPIC
Bacteria, UA: NONE SEEN
Bilirubin Urine: NEGATIVE
Glucose, UA: NEGATIVE mg/dL
Hgb urine dipstick: NEGATIVE
Ketones, ur: 5 mg/dL — AB
Leukocytes,Ua: NEGATIVE
Nitrite: NEGATIVE
Protein, ur: 30 mg/dL — AB
Specific Gravity, Urine: 1.023 (ref 1.005–1.030)
pH: 5 (ref 5.0–8.0)

## 2024-12-04 LAB — I-STAT CG4 LACTIC ACID, ED: Lactic Acid, Venous: 0.6 mmol/L (ref 0.5–1.9)

## 2024-12-04 LAB — COMPREHENSIVE METABOLIC PANEL WITH GFR
ALT: 29 U/L (ref 0–44)
AST: 40 U/L (ref 15–41)
Albumin: 3.9 g/dL (ref 3.5–5.0)
Alkaline Phosphatase: 79 U/L (ref 38–126)
Anion gap: 9 (ref 5–15)
BUN: 13 mg/dL (ref 8–23)
CO2: 24 mmol/L (ref 22–32)
Calcium: 9.1 mg/dL (ref 8.9–10.3)
Chloride: 116 mmol/L — ABNORMAL HIGH (ref 98–111)
Creatinine, Ser: 1.62 mg/dL — ABNORMAL HIGH (ref 0.61–1.24)
GFR, Estimated: 41 mL/min — ABNORMAL LOW (ref >60)
Glucose, Bld: 91 mg/dL (ref 70–99)
Potassium: 3.8 mmol/L (ref 3.5–5.1)
Sodium: 150 mmol/L — ABNORMAL HIGH (ref 135–145)
Total Bilirubin: 0.4 mg/dL (ref 0.0–1.2)
Total Protein: 7.1 g/dL (ref 6.5–8.1)

## 2024-12-04 LAB — TROPONIN T, HIGH SENSITIVITY
Troponin T High Sensitivity: 22 ng/L — ABNORMAL HIGH (ref 0–19)
Troponin T High Sensitivity: <15 ng/L (ref 0–19)

## 2024-12-04 MED ORDER — AMLODIPINE BESYLATE 5 MG PO TABS
5.0000 mg | ORAL_TABLET | Freq: Every day | ORAL | Status: DC
  Administered 2024-12-05 – 2024-12-06 (×2): 5 mg via ORAL
  Filled 2024-12-04 (×3): qty 1

## 2024-12-04 MED ORDER — ACETAMINOPHEN 650 MG RE SUPP: 650.0000 mg | Freq: Four times a day (QID) | RECTAL | Status: DC | PRN

## 2024-12-04 MED ORDER — FERROUS SULFATE 325 (65 FE) MG PO TABS: 325.0000 mg | ORAL_TABLET | Freq: Every day | ORAL | Status: DC

## 2024-12-04 MED ORDER — IRBESARTAN 150 MG PO TABS
300.0000 mg | ORAL_TABLET | Freq: Every day | ORAL | Status: DC
  Administered 2024-12-05 – 2024-12-06 (×2): 300 mg via ORAL
  Filled 2024-12-04 (×3): qty 2

## 2024-12-04 MED ORDER — LACTATED RINGERS IV BOLUS
500.0000 mL | Freq: Once | INTRAVENOUS | Status: AC
  Administered 2024-12-04: 500 mL via INTRAVENOUS

## 2024-12-04 MED ORDER — TRAZODONE HCL 100 MG PO TABS: 100.0000 mg | ORAL_TABLET | Freq: Every day | ORAL | Status: DC

## 2024-12-04 MED ORDER — DEXTROSE-SODIUM CHLORIDE 5-0.45 % IV SOLN: INTRAVENOUS | Status: DC

## 2024-12-04 MED ORDER — DONEPEZIL HCL 5 MG PO TABS
10.0000 mg | ORAL_TABLET | Freq: Every day | ORAL | Status: DC
  Administered 2024-12-05 – 2024-12-06 (×2): 10 mg via ORAL
  Filled 2024-12-04 (×3): qty 2

## 2024-12-04 MED ORDER — HALOPERIDOL 2 MG PO TABS: 2.0000 mg | ORAL_TABLET | Freq: Four times a day (QID) | ORAL | Status: DC | PRN

## 2024-12-04 MED ORDER — ENOXAPARIN SODIUM 40 MG/0.4ML IJ SOSY
40.0000 mg | PREFILLED_SYRINGE | INTRAMUSCULAR | Status: DC
  Administered 2024-12-04 – 2024-12-06 (×3): 40 mg via SUBCUTANEOUS
  Filled 2024-12-04 (×3): qty 0.4

## 2024-12-04 MED ORDER — ROSUVASTATIN CALCIUM 20 MG PO TABS
20.0000 mg | ORAL_TABLET | Freq: Every day | ORAL | Status: DC
  Administered 2024-12-05 – 2024-12-06 (×2): 20 mg via ORAL
  Filled 2024-12-04: qty 2
  Filled 2024-12-04: qty 1
  Filled 2024-12-04: qty 2
  Filled 2024-12-04 (×2): qty 1
  Filled 2024-12-04: qty 2

## 2024-12-04 MED ORDER — BUSPIRONE HCL 10 MG PO TABS
5.0000 mg | ORAL_TABLET | Freq: Two times a day (BID) | ORAL | Status: DC
  Administered 2024-12-05 – 2024-12-07 (×5): 5 mg via ORAL
  Filled 2024-12-04 (×6): qty 1

## 2024-12-04 MED ORDER — TRAZODONE HCL 50 MG PO TABS
50.0000 mg | ORAL_TABLET | Freq: Every day | ORAL | Status: DC
  Administered 2024-12-05 – 2024-12-06 (×2): 50 mg via ORAL
  Filled 2024-12-04 (×3): qty 1

## 2024-12-04 MED ORDER — AMLODIPINE-OLMESARTAN 5-40 MG PO TABS: 1.0000 | ORAL_TABLET | Freq: Every day | ORAL | Status: DC

## 2024-12-04 MED ORDER — ACETAMINOPHEN 325 MG PO TABS
650.0000 mg | ORAL_TABLET | Freq: Four times a day (QID) | ORAL | Status: DC | PRN
  Filled 2024-12-04: qty 2

## 2024-12-04 MED ORDER — HALOPERIDOL LACTATE 5 MG/ML IJ SOLN
2.0000 mg | Freq: Once | INTRAMUSCULAR | Status: AC
  Administered 2024-12-04: 2 mg via INTRAVENOUS
  Filled 2024-12-04: qty 1

## 2024-12-04 MED ORDER — ADULT MULTIVITAMIN W/MINERALS CH
1.0000 | ORAL_TABLET | Freq: Every day | ORAL | Status: DC
  Administered 2024-12-05: 1 via ORAL
  Filled 2024-12-04: qty 1

## 2024-12-04 NOTE — TOC CM/SW Note (Signed)
 CM messaged via Epic chat for possible d/c needs including possible SNF placement. Per MD, plan is to admit patient. Therapy recs are currently pending.   Follow-up to be completed with patient as appropriate.   Merilee Batty, MSN, RN Case Management [phone]

## 2024-12-04 NOTE — H&P (Signed)
 History and Physical    Jerry [name] FMW:[id number] DOB: 1936-09-19 DOA: 12/04/2024  PCP: Katrinka Garnette KIDD, MD  Patient coming from: Home  I have personally briefly reviewed patient's old medical records available.   Chief Complaint: Slid off the chair  HPI: Jerry [name] is a 88 y.o. male with medical history significant of dementia, CKD stage IIIa, hyperlipidemia and hypertension, who lives with a caretaker 24/7 brought to the emergency room to be checked as he slid off the chair onto the floor.  Patient is oriented to himself.  Does not give any history.  During all the time of interview, he was eating sandwich, did not do any eye contact and told me he is fine.  His daughter at the bedside gives history. According to patient's daughter, patient has dementia and occasional agitations. He has advanced dementia but he is well taken care and family satisfied with the care.  They have not reported any acute changes other than his usual agitations. ED Course: Hemodynamically stable.  No evidence of trauma.  Chest x-ray negative.  On room air.  Creatinine 1.62 and sodium 150 with evidence of free water deficit.  Was given 500 cc normal saline. Patient was agitated in the ER, given 1 dose of Haldol . Daughter has decided to spend night with him in the hospital. Admitted for monitoring, IV fluids.  PT OT.  Review of Systems: all systems are reviewed and pertinent positive as per HPI otherwise rest are negative.    Past Medical History:  Diagnosis Date   Cancer of prostate (HCC) 2001   Chronic kidney disease (CKD), stage III (moderate) (HCC)    /notes 02/08/2017   Dementia (HCC)    DIVERTICULOSIS, COLON 07/25/2007   Qualifier: Diagnosis of  By: Trudy, LPN, Kendell HERO    High cholesterol    History of radiation therapy 05/01/17-05/09/17   right lung 54 Gy in 3 fractions   History of radiation therapy    prostate radioactive seed placement 07/2000  Dr Jason   Hypertension    Lung  cancer (HCC) 03/2017    Past Surgical History:  Procedure Laterality Date   DIRECT LARYNGOSCOPY N/A 06/21/2017   Procedure: DIRECT LARYNGOSCOPY;  Surgeon: Roark Rush, MD;  Location: [facility] OR;  Service: ENT;  Laterality: N/A;   EYE SURGERY     INSERTION PROSTATE RADIATION SEED  07/2000   thelbert 05/09/2011   PENILE PROSTHESIS IMPLANT  02/2002   Insertion of Mentor three piece inflatable penile prosthesis./notes 05/09/2011   PENILE PROSTHESIS PLACEMENT  06/2003   Placement of Mentor rod prosthesis/notes 05/09/2011   REFRACTIVE SURGERY Bilateral    REMOVAL OF PENILE PROSTHESIS  06/2003   Removal of three-piece Mentor prosthesis/notes 05/09/2011   SCROTAL SURGERY  06/2003   Repair of scrotal defect/notes 05/09/2011    Social history   reports that he quit smoking about 35 years ago. His smoking use included cigarettes. He has never used smokeless tobacco. He reports that he does not drink alcohol and does not use drugs.  Allergies  Allergen Reactions   Namenda  [Memantine ] Other (See Comments)    Made the patient feel dizzy    Family History  Problem Relation Age of Onset   Early death Father        cirrhosis   Alcoholism Father    Cirrhosis Other    Breast cancer Sister      Prior to Admission medications   Medication Sig Start Date End Date Taking? Authorizing Provider  amLODipine -olmesartan  (  AZOR ) 5-40 MG tablet Take 1 tablet by mouth daily. Patient taking differently: Take 1 tablet by mouth at bedtime. 08/30/24  Yes Katrinka Garnette KIDD, MD  busPIRone  (BUSPAR ) 5 MG tablet Take 1 tablet (5 mg total) by mouth 2 (two) times daily. Patient taking differently: Take 5 mg by mouth in the morning and at bedtime. 08/30/24  Yes Katrinka Garnette KIDD, MD  donepezil  (ARICEPT ) 10 MG tablet TAKE ONE TABLET BY MOUTH EVERY DAY Patient taking differently: Take 10 mg by mouth at bedtime. 10/28/24  Yes Katrinka Garnette KIDD, MD  rosuvastatin  (CRESTOR ) 20 MG tablet TAKE ONE TABLET BY MOUTH EVERY DAY Patient  taking differently: Take 20 mg by mouth at bedtime. 10/28/24  Yes Katrinka Garnette KIDD, MD  traZODone  (DESYREL ) 100 MG tablet Take 1 tablet (100 mg total) by mouth at bedtime. Patient taking differently: Take 50 mg by mouth at bedtime. 08/30/24  Yes Katrinka Garnette KIDD, MD  benzonatate  (TESSALON  PERLES) 100 MG capsule Take 1 capsule (100 mg total) by mouth 2 (two) times daily as needed for cough. Patient not taking: Reported on 12/04/2024 08/22/24   Katrinka Garnette KIDD, MD  diclofenac  Sodium (VOLTAREN ) 1 % GEL Apply 2 g topically 4 (four) times daily. Patient not taking: Reported on 12/04/2024 10/12/20   Katrinka Garnette KIDD, MD  ferrous sulfate  325 (65 FE) MG tablet Take three times a week Patient not taking: Reported on 12/04/2024 08/30/24   Katrinka Garnette KIDD, MD    Physical Exam: Vitals:   12/04/24 1300 12/04/24 1315 12/04/24 1808 12/04/24 1824  BP: (!) 146/87  (!) 160/83   Pulse: 72  93   Resp: 18  16   Temp:  98 F (36.7 C) (!) 97.5 F (36.4 C)   TempSrc:  Oral    SpO2: 93%  97%   Weight:    85.9 kg  Height:    5' 10 (1.778 m)    Constitutional: NAD, calm, comfortable.  Looks fairly comfortable. Vitals:   12/04/24 1300 12/04/24 1315 12/04/24 1808 12/04/24 1824  BP: (!) 146/87  (!) 160/83   Pulse: 72  93   Resp: 18  16   Temp:  98 F (36.7 C) (!) 97.5 F (36.4 C)   TempSrc:  Oral    SpO2: 93%  97%   Weight:    85.9 kg  Height:    5' 10 (1.778 m)   Eyes: PERRL, lids and conjunctivae normal ENMT: Mucous membranes are moist. Posterior pharynx clear of any exudate or lesions.Normal dentition.  Neck: normal, supple, no masses, no thyromegaly Respiratory: clear to auscultation bilaterally, no wheezing, no crackles. Normal respiratory effort. No accessory muscle use.  On room air. Cardiovascular: Regular rate and rhythm, no murmurs / rubs / gallops. No extremity edema. 2+ pedal pulses. No carotid bruits.  Abdomen: no tenderness, no masses palpated. No hepatosplenomegaly. Bowel sounds  positive.  Musculoskeletal: no clubbing / cyanosis. No joint deformity upper and lower extremities. Good ROM, no contractures. Normal muscle tone.  Skin: no rashes, lesions, ulcers. No induration Neurologic: CN 2-12 grossly intact. Sensation intact, DTR normal. Strength 5/5 in all 4.  Generalized weakness. Psychiatric: Impaired judgment and insight. Alert and oriented x 1.  Normal mood on my exam.  Labs on Admission: I have personally reviewed following labs and imaging studies  CBC: Recent Labs  Lab 12/04/24 1310  WBC 10.0  NEUTROABS 7.1  HGB 13.9  HCT 43.6  MCV 95.4  PLT 186   Basic Metabolic Panel: Recent Labs  Lab 12/04/24 1310  NA 150*  K 3.8  CL 116*  CO2 24  GLUCOSE 91  BUN 13  CREATININE 1.62*  CALCIUM  9.1   GFR: Estimated Creatinine Clearance: 33.8 mL/min (A) (by C-G formula based on SCr of 1.62 mg/dL (H)). Liver Function Tests: Recent Labs  Lab 12/04/24 1310  AST 40  ALT 29  ALKPHOS 79  BILITOT 0.4  PROT 7.1  ALBUMIN 3.9   No results for input(s): LIPASE, AMYLASE in the last 168 hours. No results for input(s): AMMONIA in the last 168 hours. Coagulation Profile: No results for input(s): INR, PROTIME in the last 168 hours. Cardiac Enzymes: No results for input(s): CKTOTAL, CKMB, CKMBINDEX, TROPONINI in the last 168 hours. BNP (last 3 results) No results for input(s): PROBNP in the last 8760 hours. HbA1C: No results for input(s): HGBA1C in the last 72 hours. CBG: No results for input(s): GLUCAP in the last 168 hours. Lipid Profile: No results for input(s): CHOL, HDL, LDLCALC, TRIG, CHOLHDL, LDLDIRECT in the last 72 hours. Thyroid  Function Tests: No results for input(s): TSH, T4TOTAL, FREET4, T3FREE, THYROIDAB in the last 72 hours. Anemia Panel: No results for input(s): VITAMINB12, FOLATE, FERRITIN, TIBC, IRON, RETICCTPCT in the last 72 hours. Urine analysis:    Component Value Date/Time    COLORURINE YELLOW 12/04/2024 1747   APPEARANCEUR CLEAR 12/04/2024 1747   LABSPEC 1.023 12/04/2024 1747   PHURINE 5.0 12/04/2024 1747   GLUCOSEU NEGATIVE 12/04/2024 1747   HGBUR NEGATIVE 12/04/2024 1747   BILIRUBINUR NEGATIVE 12/04/2024 1747   BILIRUBINUR neg 10/18/2023 1120   KETONESUR 5 (A) 12/04/2024 1747   PROTEINUR 30 (A) 12/04/2024 1747   UROBILINOGEN 0.2 10/18/2023 1120   UROBILINOGEN 0.2 09/24/2022 1203   NITRITE NEGATIVE 12/04/2024 1747   LEUKOCYTESUR NEGATIVE 12/04/2024 1747    Radiological Exams on Admission: DG Chest Port 1 View Result Date: 12/04/2024 CLINICAL DATA:  Cough.  Fell. EXAM: PORTABLE CHEST 1 VIEW COMPARISON:  09/17/2022.  Chest CT dated 01/12/2024. FINDINGS: Stable right upper lobe linear scarring. Otherwise, clear lungs with normal vascularity. Poor inspiration. Normal-sized heart. Tortuous and partially calcified thoracic aorta. Thoracic spine degenerative changes. IMPRESSION: No acute abnormality. Electronically Signed   By: Elspeth Bathe M.D.   On: 12/04/2024 13:45    EKG: Independently reviewed.  Chronic right bundle branch block.  Comparable to previous EKG.  Assessment/Plan Principal Problem:   Dehydration Active Problems:   Essential hypertension   Hyperlipidemia   Severe dementia (HCC)   CKD (chronic kidney disease), stage III (HCC)     1.  AKI on CKD stage IIIa, hyperchloremic hypernatremia due to free water deficit, moderate dehydration. Received 500 cc isotonic fluid.  Will keep on D5 half-normal saline overnight.  Encourage oral intake.  Encouraged family to feed him frequent water and food.  Recheck levels tomorrow morning.  Will likely need education and more supervision at home.  2.  Severe dementia with behavior disturbances: Anticipate more delirium and confusion while in the hospital.  Family to spend night in the hospital.  Patient is on trazodone  at night, Aricept  and BuSpar  that will be continued.  Will use Haldol  by mouth as  needed for severe agitation.  Delirium precautions.  Fall precautions.  PT OT.  Once we have therapies, will be able to do assessment for safe disposition.  Family still agrees that he is well taken care.  3.  Essential hypertension: Blood pressure is stable.  Will continue amlodipine  olmesartan .  4.  Hyperlipidemia: On a statin.  Tolerating.  Will continue.   DVT prophylaxis: Lovenox  subcu Code Status: Full code.  Daughter at the bedside he stated full code.  She knows that he has probably a DNR at care home but she does not agree with it Family Communication: Daughter at the bedside Disposition Plan: Likely care home Consults called: None Admission status: Observation   Renato Applebaum MD [facility]

## 2024-12-04 NOTE — Progress Notes (Signed)
 CSW spoke with both daughters Jerry [name] and Jerry [name] earlier. Jerry [name] lives in [place]  and is HCPOA and would like for [facility] services to come in. She did not feel the pt needed rehab. She reports they moved pt out of his home and into a care home with a lady who was recommended through a family friend. She states her sister Jerry [name] helps out a few days per week with bathing.   Per chart review, pt had agitation. CSW inquired about plan for TTS to evaluate for medication adjustments. Per EDP, plan is for possible admission. IP ICM team to follow.

## 2024-12-04 NOTE — ED Notes (Signed)
 Family updated as to patient's status.

## 2024-12-04 NOTE — ED Provider Notes (Signed)
 [place] EMERGENCY DEPARTMENT AT [facility] Provider Note   CSN: [id number] Arrival date & time: 12/04/24  9042     Patient presents with: Jerry [name] is a 88 y.o. male.   HPI Patient has history of dementia.  He is being brought in by EMS for a fall.  No injuries identified.  Patient's daughter reports that he has become more resistant to usual activities at home.  Patient lives in a type of private home setting not with his daughters.  He apparently has been having some weight loss and becoming more resistant to eating.  Today he slid to the floor but did not appear to have any injury.  But once on the floor it was difficult to get him back up and he became more agitated.  Thus EMS was called.  Patient is confused to place and time.    Prior to Admission medications  Medication Sig Start Date End Date Taking? Authorizing Provider  amLODipine -olmesartan  (AZOR ) 5-40 MG tablet Take 1 tablet by mouth daily. Patient taking differently: Take 1 tablet by mouth at bedtime. 08/30/24  Yes Katrinka Garnette KIDD, MD  busPIRone  (BUSPAR ) 5 MG tablet Take 1 tablet (5 mg total) by mouth 2 (two) times daily. Patient taking differently: Take 5 mg by mouth in the morning and at bedtime. 08/30/24  Yes Katrinka Garnette KIDD, MD  donepezil  (ARICEPT ) 10 MG tablet TAKE ONE TABLET BY MOUTH EVERY DAY Patient taking differently: Take 10 mg by mouth at bedtime. 10/28/24  Yes Katrinka Garnette KIDD, MD  rosuvastatin  (CRESTOR ) 20 MG tablet TAKE ONE TABLET BY MOUTH EVERY DAY Patient taking differently: Take 20 mg by mouth at bedtime. 10/28/24  Yes Katrinka Garnette KIDD, MD  traZODone  (DESYREL ) 100 MG tablet Take 1 tablet (100 mg total) by mouth at bedtime. Patient taking differently: Take 50 mg by mouth at bedtime. 08/30/24  Yes Katrinka Garnette KIDD, MD  benzonatate  (TESSALON  PERLES) 100 MG capsule Take 1 capsule (100 mg total) by mouth 2 (two) times daily as needed for cough. Patient not taking: Reported on  12/04/2024 08/22/24   Katrinka Garnette KIDD, MD  diclofenac  Sodium (VOLTAREN ) 1 % GEL Apply 2 g topically 4 (four) times daily. Patient not taking: Reported on 12/04/2024 10/12/20   Katrinka Garnette KIDD, MD  ferrous sulfate  325 (65 FE) MG tablet Take three times a week Patient not taking: Reported on 12/04/2024 08/30/24   Katrinka Garnette KIDD, MD    Allergies: Namenda  [memantine ]    Review of Systems  Updated Vital Signs BP 130/60 (BP Location: Left Arm)   Pulse 63   Temp 97.6 F (36.4 C)   Resp 18   Ht 5' 10 (1.778 m)   Wt 85.9 kg   SpO2 99%   BMI 27.17 kg/m   Physical Exam Constitutional:      Comments: Patient is alert.  He is not in acute distress but is doing some picking and is hostile towards his daughter trying to guide or instruct him at all.  No respiratory distress.  Well-nourished well-developed.  HENT:     Head: Normocephalic and atraumatic.     Mouth/Throat:     Pharynx: Oropharynx is clear.  Eyes:     Extraocular Movements: Extraocular movements intact.  Cardiovascular:     Rate and Rhythm: Normal rate and regular rhythm.  Pulmonary:     Effort: Pulmonary effort is normal.     Breath sounds: Normal breath sounds.  Abdominal:  General: There is no distension.     Palpations: Abdomen is soft.     Tenderness: There is no abdominal tenderness. There is no guarding.  Musculoskeletal:        General: No deformity or signs of injury. Normal range of motion.     Right lower leg: No edema.     Left lower leg: No edema.  Skin:    General: Skin is warm and dry.  Neurological:     Comments: Patient is very alert.  His speech is clear but content is consistent with dementia.  He is making nonsensical responses in conversation and very hostile towards his daughter trying to help rearrange his bedding and cords.  Making threatening comments.  No focal motor deficits.  He has good strength and range of motion.  Patient has been up and down and ambulatory according to gait.      (all labs ordered are listed, but only abnormal results are displayed) Labs Reviewed  COMPREHENSIVE METABOLIC PANEL WITH GFR - Abnormal; Notable for the following components:      Result Value   Sodium 150 (*)    Chloride 116 (*)    Creatinine, Ser 1.62 (*)    GFR, Estimated 41 (*)    All other components within normal limits  URINALYSIS, ROUTINE W REFLEX MICROSCOPIC - Abnormal; Notable for the following components:   Ketones, ur 5 (*)    Protein, ur 30 (*)    All other components within normal limits  BASIC METABOLIC PANEL WITH GFR - Abnormal; Notable for the following components:   Sodium 149 (*)    Chloride 118 (*)    Creatinine, Ser 1.50 (*)    Calcium  8.6 (*)    GFR, Estimated 45 (*)    All other components within normal limits  BASIC METABOLIC PANEL WITH GFR - Abnormal; Notable for the following components:   Sodium 149 (*)    Chloride 116 (*)    Glucose, Bld 104 (*)    Creatinine, Ser 1.36 (*)    Calcium  8.6 (*)    GFR, Estimated 51 (*)    All other components within normal limits  TROPONIN T, HIGH SENSITIVITY - Abnormal; Notable for the following components:   Troponin T High Sensitivity 22 (*)    All other components within normal limits  CBC WITH DIFFERENTIAL/PLATELET  I-STAT CG4 LACTIC ACID, ED  TROPONIN T, HIGH SENSITIVITY    EKG: EKG Interpretation Date/Time:  Wednesday December 04 2024 10:12:39 EST Ventricular Rate:  77 PR Interval:  183 QRS Duration:  144 QT Interval:  456 QTC Calculation: 517 R Axis:   268  Text Interpretation: Sinus rhythm Supraventricular bigeminy Right bundle branch block Inferior infarct, old Old RBBB no sig change from previous Confirmed by Armenta Canning [phone]) on 12/04/2024 10:25:15 AM  Radiology: [facility] Chest Port 1 View Result Date: 12/04/2024 CLINICAL DATA:  Cough.  Fell. EXAM: PORTABLE CHEST 1 VIEW COMPARISON:  09/17/2022.  Chest CT dated 01/12/2024. FINDINGS: Stable right upper lobe linear scarring. Otherwise, clear  lungs with normal vascularity. Poor inspiration. Normal-sized heart. Tortuous and partially calcified thoracic aorta. Thoracic spine degenerative changes. IMPRESSION: No acute abnormality. Electronically Signed   By: Elspeth Bathe M.D.   On: 12/04/2024 13:45     Procedures   Medications Ordered in the ED  busPIRone  (BUSPAR ) tablet 5 mg (5 mg Oral Given 12/05/24 1035)  donepezil  (ARICEPT ) tablet 10 mg (10 mg Oral Not Given 12/04/24 2240)  multivitamin with minerals tablet 1  tablet (1 tablet Oral Given 12/05/24 1035)  rosuvastatin  (CRESTOR ) tablet 20 mg (20 mg Oral Not Given 12/04/24 2215)  traZODone  (DESYREL ) tablet 50 mg (50 mg Oral Not Given 12/04/24 2250)  enoxaparin  (LOVENOX ) injection 40 mg (40 mg Subcutaneous Given 12/04/24 2215)  acetaminophen  (TYLENOL ) tablet 650 mg (has no administration in time range)    Or  acetaminophen  (TYLENOL ) suppository 650 mg (has no administration in time range)  haloperidol  (HALDOL ) tablet 2 mg (has no administration in time range)  amLODipine  (NORVASC ) tablet 5 mg (5 mg Oral Not Given 12/04/24 2215)    And  irbesartan  (AVAPRO ) tablet 300 mg (300 mg Oral Patient Refused/Not Given 12/04/24 2215)  dextrose 5 % solution ( Intravenous Rate/Dose Change 12/05/24 1533)  haloperidol  lactate (HALDOL ) injection 2 mg (2 mg Intravenous Given 12/05/24 1533)  haloperidol  lactate (HALDOL ) injection 2 mg (2 mg Intravenous Given 12/04/24 1611)  lactated ringers  bolus 500 mL (500 mLs Intravenous New Bag/Given 12/04/24 1808)                                    Medical Decision Making Amount and/or Complexity of Data Reviewed Labs: ordered. Radiology: ordered.  Risk Prescription drug management. Decision regarding hospitalization.  Presents as outlined.  Known dementia but worsening symptoms and now recent gentle fall to floor with increased weakness.  Will proceed with diagnostic evaluation.  Patient became more agitated and started trying to leave the room in  the bed.  A dose of Haldol  given.  Urinalysis negative.  Troponin less than 15.  Lactic 0.6.  Sodium 150.  GFR 41.  CBC normal with normal differential.  Labs suggestive of dehydration and hypernatremia.  Will plan for treatment and admission.  Consult [facility] for admission.      Final diagnoses:  Hypernatremia  Dehydration  Severe dementia with agitation, unspecified dementia type [facility])    ED Discharge Orders     None          Armenta Canning, MD 12/05/24 1605

## 2024-12-04 NOTE — Telephone Encounter (Signed)
 E2C2 Agent called stated Patient currently in ambulance wanting to know which Hospital to go to. Advised [facility].

## 2024-12-04 NOTE — ED Notes (Signed)
Pt assisted to bathroom in wheelchair.

## 2024-12-04 NOTE — Progress Notes (Signed)
°  PT Cancellation Note  Patient Details Name: Jerry [name] MRN: [id number] DOB: 14-Jan-1936   Cancelled Treatment:    Reason Eval/Treat Not Completed: Other (comment) Patient has been in Ed 5 hours, no MD admit note found. PT will proceed with PT Eval in AM.  Leigh Darice Norris 12/04/2024, 3:28 PM

## 2024-12-04 NOTE — ED Notes (Signed)
 Pt provided sandwich and juice.

## 2024-12-04 NOTE — Progress Notes (Signed)
 CSW attempted to outreach to pt's daughter, [name]) without success. Left HIPAA Compliant voicemail requesting call back.

## 2024-12-04 NOTE — ED Notes (Signed)
 Pt undirectable, verbally abusive, swinging at family and staff. Pt able to ambulate appropriately in the room. Pt daughter, visual merchandiser, and RN at bedside. Reached out to MD for medications due to increased agitation. MD ordered IV Haldol . MD states will come to bedside to evaluate.

## 2024-12-04 NOTE — ED Notes (Signed)
 Patient denies pain and is resting comfortably.

## 2024-12-04 NOTE — Telephone Encounter (Signed)
 Noted. Will follow up on hospitalization.

## 2024-12-04 NOTE — Telephone Encounter (Signed)
 Noted. Will review hospital notes.   Copied from CRM [id number]. Topic: Clinical - Medical Advice >> Dec 04, 2024  9:36 AM Rea ORN wrote: Reason for CRM: Pt daughter Meade called to advise that pt slid out of chair and the caregiver could not get the pt up. They called EMS to get him up and it was advised that he did not hit his head. Meade called clinic to be advised which hospital to send pt to. I called [facility] and spoke with front desk. I advised Meade that the clinic typically sends pt to [facility] but also advised that they can go to whichever hospital is the closet. Meade stated she will be looking to move her dad from the home since she is concerned about the care he is receiving and the inability to lift pt after a fall.   Please call back Meade [phone] or [phone] (Joni, Gloria's Daughter) if she does not answer. She will be with Joni all day today.

## 2024-12-04 NOTE — ED Triage Notes (Addendum)
 BIB EMS from home with C/O of a fall, no injuries noted from fall. Family reports increased aggression/agitation at home and decreased weight. Pt  has pmhx of dementia. Pt confused at this time. A/Ox2. Pt not on blood thinners, denies hitting his head

## 2024-12-04 NOTE — ED Notes (Signed)
 Pt at this time in bed, NAD, beverage given, family updated.

## 2024-12-05 DIAGNOSIS — Z888 Allergy status to other drugs, medicaments and biological substances status: Secondary | ICD-10-CM | POA: Diagnosis not present

## 2024-12-05 DIAGNOSIS — E878 Other disorders of electrolyte and fluid balance, not elsewhere classified: Secondary | ICD-10-CM | POA: Diagnosis present

## 2024-12-05 DIAGNOSIS — E785 Hyperlipidemia, unspecified: Secondary | ICD-10-CM | POA: Diagnosis not present

## 2024-12-05 DIAGNOSIS — E860 Dehydration: Secondary | ICD-10-CM | POA: Diagnosis present

## 2024-12-05 DIAGNOSIS — N1831 Chronic kidney disease, stage 3a: Secondary | ICD-10-CM | POA: Diagnosis present

## 2024-12-05 DIAGNOSIS — D631 Anemia in chronic kidney disease: Secondary | ICD-10-CM | POA: Diagnosis present

## 2024-12-05 DIAGNOSIS — F03C11 Unspecified dementia, severe, with agitation: Secondary | ICD-10-CM | POA: Diagnosis present

## 2024-12-05 DIAGNOSIS — E8809 Other disorders of plasma-protein metabolism, not elsewhere classified: Secondary | ICD-10-CM | POA: Diagnosis present

## 2024-12-05 DIAGNOSIS — N179 Acute kidney failure, unspecified: Secondary | ICD-10-CM | POA: Diagnosis present

## 2024-12-05 DIAGNOSIS — R7989 Other specified abnormal findings of blood chemistry: Secondary | ICD-10-CM | POA: Diagnosis not present

## 2024-12-05 DIAGNOSIS — F03C0 Unspecified dementia, severe, without behavioral disturbance, psychotic disturbance, mood disturbance, and anxiety: Secondary | ICD-10-CM | POA: Diagnosis not present

## 2024-12-05 DIAGNOSIS — Z6827 Body mass index (BMI) 27.0-27.9, adult: Secondary | ICD-10-CM | POA: Diagnosis not present

## 2024-12-05 DIAGNOSIS — I10 Essential (primary) hypertension: Secondary | ICD-10-CM | POA: Diagnosis not present

## 2024-12-05 DIAGNOSIS — Z79899 Other long term (current) drug therapy: Secondary | ICD-10-CM | POA: Diagnosis not present

## 2024-12-05 DIAGNOSIS — E871 Hypo-osmolality and hyponatremia: Secondary | ICD-10-CM | POA: Diagnosis not present

## 2024-12-05 DIAGNOSIS — Z66 Do not resuscitate: Secondary | ICD-10-CM | POA: Diagnosis present

## 2024-12-05 DIAGNOSIS — W19XXXA Unspecified fall, initial encounter: Secondary | ICD-10-CM | POA: Diagnosis present

## 2024-12-05 DIAGNOSIS — I129 Hypertensive chronic kidney disease with stage 1 through stage 4 chronic kidney disease, or unspecified chronic kidney disease: Secondary | ICD-10-CM | POA: Diagnosis present

## 2024-12-05 DIAGNOSIS — Z85118 Personal history of other malignant neoplasm of bronchus and lung: Secondary | ICD-10-CM | POA: Diagnosis not present

## 2024-12-05 DIAGNOSIS — Z6372 Alcoholism and drug addiction in family: Secondary | ICD-10-CM | POA: Diagnosis not present

## 2024-12-05 DIAGNOSIS — F03C18 Unspecified dementia, severe, with other behavioral disturbance: Secondary | ICD-10-CM | POA: Diagnosis present

## 2024-12-05 DIAGNOSIS — E7800 Pure hypercholesterolemia, unspecified: Secondary | ICD-10-CM | POA: Diagnosis present

## 2024-12-05 DIAGNOSIS — E663 Overweight: Secondary | ICD-10-CM | POA: Diagnosis present

## 2024-12-05 DIAGNOSIS — Z811 Family history of alcohol abuse and dependence: Secondary | ICD-10-CM | POA: Diagnosis not present

## 2024-12-05 DIAGNOSIS — Z87891 Personal history of nicotine dependence: Secondary | ICD-10-CM | POA: Diagnosis not present

## 2024-12-05 DIAGNOSIS — E876 Hypokalemia: Secondary | ICD-10-CM | POA: Diagnosis present

## 2024-12-05 DIAGNOSIS — Z8546 Personal history of malignant neoplasm of prostate: Secondary | ICD-10-CM | POA: Diagnosis not present

## 2024-12-05 DIAGNOSIS — Z923 Personal history of irradiation: Secondary | ICD-10-CM | POA: Diagnosis not present

## 2024-12-05 DIAGNOSIS — E870 Hyperosmolality and hypernatremia: Secondary | ICD-10-CM | POA: Diagnosis present

## 2024-12-05 LAB — BASIC METABOLIC PANEL WITH GFR
Anion gap: 9 (ref 5–15)
Anion gap: 8 (ref 5–15)
BUN: 12 mg/dL (ref 8–23)
BUN: 11 mg/dL (ref 8–23)
CO2: 23 mmol/L (ref 22–32)
CO2: 24 mmol/L (ref 22–32)
Calcium: 8.6 mg/dL — ABNORMAL LOW (ref 8.9–10.3)
Calcium: 8.6 mg/dL — ABNORMAL LOW (ref 8.9–10.3)
Chloride: 118 mmol/L — ABNORMAL HIGH (ref 98–111)
Chloride: 116 mmol/L — ABNORMAL HIGH (ref 98–111)
Creatinine, Ser: 1.5 mg/dL — ABNORMAL HIGH (ref 0.61–1.24)
Creatinine, Ser: 1.36 mg/dL — ABNORMAL HIGH (ref 0.61–1.24)
GFR, Estimated: 45 mL/min — ABNORMAL LOW (ref >60)
GFR, Estimated: 51 mL/min — ABNORMAL LOW (ref >60)
Glucose, Bld: 82 mg/dL (ref 70–99)
Glucose, Bld: 104 mg/dL — ABNORMAL HIGH (ref 70–99)
Potassium: 3.8 mmol/L (ref 3.5–5.1)
Potassium: 3.5 mmol/L (ref 3.5–5.1)
Sodium: 149 mmol/L — ABNORMAL HIGH (ref 135–145)
Sodium: 149 mmol/L — ABNORMAL HIGH (ref 135–145)

## 2024-12-05 MED ORDER — DEXTROSE 5 % IV SOLN: INTRAVENOUS | Status: AC

## 2024-12-05 MED ORDER — HALOPERIDOL LACTATE 5 MG/ML IJ SOLN
2.0000 mg | Freq: Four times a day (QID) | INTRAMUSCULAR | Status: DC | PRN
  Administered 2024-12-05 – 2024-12-06 (×2): 2 mg via INTRAVENOUS
  Filled 2024-12-05 (×2): qty 1

## 2024-12-05 NOTE — Evaluation (Signed)
 Occupational Therapy Evaluation Patient Details Name: Jerry [name] MRN: [id number] DOB: [birth year] Today's Date: 12/05/2024   History of Present Illness   Jerry [name] is a 88 y.o. male who lives with a caretaker (lives in a private care home) 24/7 brought to the emergency room to be checked as he slid off the chair onto the floor.  Obs status for dehydration PMH:  dementia, CKD stage IIIa, hyperlipidemia and hypertension     Clinical Impressions PTA, patient lives in private care home with assist for mobility and A/IADL's by caregiver- sponge bathes at baseline but has commode and hospital bed. Daughter present bedside for assist with history due to patient's cognition and carryover of safety recommendations with + understanding reported.  Currently, patient presents with deficits outlined below (see OT Problem List for details) most significantly  decreased cognition, balance and activity tolerance limiting BADL's (min A UB/mod A LB with cues) and functional mobility performance (HHA with cues for mobility) performance. Patient cooperative during session and OT recommending HHOT services upon discharge from acute setting with 24 hr S/A from caregiver. Patient requires continued Acute care hospital level OT services to progress safety and functional performance and allow for discharge.       If plan is discharge home, recommend the following:   A little help with walking and/or transfers;A lot of help with bathing/dressing/bathroom;Assistance with cooking/housework;Assistance with feeding;Direct supervision/assist for medications management;Direct supervision/assist for financial management;Assist for transportation;Help with stairs or ramp for entrance;Supervision due to cognitive status     Functional Status Assessment   Patient has had a recent decline in their functional status and demonstrates the ability to make significant improvements in function in a reasonable and  predictable amount of time.     Equipment Recommendations   Other (comment) (has 3 in 1 to place in walk in shower if needed)      Precautions/Restrictions   Precautions Precautions: Fall Recall of Precautions/Restrictions: Impaired Precaution/Restrictions Comments: daughter stated she's not aware of any falls in past 6 months other than one just prior to admission Restrictions Weight Bearing Restrictions Per Provider Order: No     Mobility Bed Mobility Overal bed mobility:  (was in recliner and remained post session)                  Transfers Overall transfer level: Needs assistance Equipment used: 2 person hand held assist Transfers: Sit to/from Stand, Bed to chair/wheelchair/BSC Sit to Stand: Min assist     Step pivot transfers: Min assist, +2 physical assistance     General transfer comment: cues for sequencing and safety, 2nd S for safety      Balance Overall balance assessment: Needs assistance Sitting-balance support: Feet supported, No upper extremity supported Sitting balance-Leahy Scale: Good     Standing balance support: Single extremity supported, During functional activity Standing balance-Leahy Scale: Fair                             ADL either performed or assessed with clinical judgement   ADL Overall ADL's : Needs assistance/impaired Eating/Feeding: Minimal assistance;Cueing for safety;Sitting;Cueing for sequencing;Cueing for compensatory techinques Eating/Feeding Details (indicate cue type and reason): min cues for rate and bite size, would benefit from OT in home to train caregiver in strategies Grooming: Wash/dry hands;Wash/dry face;Oral care;Minimal assistance;Cueing for safety;Cueing for sequencing   Upper Body Bathing: Moderate assistance;Sitting   Lower Body Bathing: Moderate assistance;Sit to/from stand;Cueing for safety;Cueing for sequencing  Upper Body Dressing : Moderate assistance;Sitting;Cueing for  sequencing;Cueing for safety   Lower Body Dressing: Moderate assistance;Sit to/from stand;Cueing for safety;Cueing for sequencing   Toilet Transfer: Minimal assistance;BSC/3in1;Cueing for safety;Cueing for sequencing   Toileting- Clothing Manipulation and Hygiene: Moderate assistance;Sitting/lateral lean     Tub/Shower Transfer Details (indicate cue type and reason): dtr reports patient sponge bathes at baseline but would like strategy training in care home if able by [facility] services Functional mobility during ADLs: Minimal assistance;Cueing for safety;Cueing for sequencing General ADL Comments: cues for all safety and sequencing, dtr present for gait belt use training for falls prevention     Vision Ability to See in Adequate Light: 0 Adequate Vision Assessment?: No apparent visual deficits            Pertinent Vitals/Pain Pain Assessment Pain Assessment: Faces Faces Pain Scale: No hurt     Extremity/Trunk Assessment Upper Extremity Assessment Upper Extremity Assessment: Overall WFL for tasks assessed;Right hand dominant   Lower Extremity Assessment Lower Extremity Assessment: Defer to PT evaluation   Cervical / Trunk Assessment Cervical / Trunk Assessment: Kyphotic   Communication Communication Communication: No apparent difficulties   Cognition Arousal: Alert Behavior During Therapy: WFL for tasks assessed/performed Cognition: History of cognitive impairments             OT - Cognition Comments: alert, Ox self only, follows simple commands for BADL's, needs redirection for safety, falls prevention, decreased STM and initiation, poor safety and judgement                 Following commands: Impaired Following commands impaired: Follows one step commands with increased time     Cueing  General Comments   Cueing Techniques: Verbal cues  no SOB, edema or skin issues noted           Home Living Family/patient expects to be discharged to:: Group  home Living Arrangements: Non-relatives/Friends Available Help at Discharge: Family;Available PRN/intermittently Type of Home: House Home Access: Stairs to enter [employer] of Steps: 3 Entrance Stairs-Rails: None Home Layout: One level     Bathroom Shower/Tub: Producer, Television/film/video: Standard Bathroom Accessibility: Yes   Home Equipment: BSC/3in1;Hospital bed   Additional Comments: lives in a care home per pt's daughter, older woman is 24/7 caregiver there; pt not able to provide info 2* dementia      Prior Functioning/Environment Prior Level of Function : Independent/Modified Independent             Mobility Comments: walks in the home without AD, no falls in past 6 months other than one just PTA ADLs Comments: pt resistant to bathing at care home per daughter    OT Problem List: Impaired balance (sitting and/or standing);Decreased cognition;Decreased safety awareness   OT Treatment/Interventions: Self-care/ADL training;Therapeutic exercise;Neuromuscular education;Energy conservation;DME and/or AE instruction;Therapeutic activities;Cognitive remediation/compensation;Patient/family education;Balance training      OT Goals(Current goals can be found in the care plan section)   Acute Rehab OT Goals Patient Stated Goal: dtr- to be safe OT Goal Formulation: With family Time For Goal Achievement: 12/19/24 Potential to Achieve Goals: Fair ADL Goals Pt Will Perform Eating: with set-up Pt Will Perform Grooming: with supervision Pt Will Perform Upper Body Bathing: with contact guard assist;sitting Pt Will Perform Upper Body Dressing: with min assist;sitting Pt Will Transfer to Toilet: with contact guard assist;bedside commode Pt Will Perform Toileting - Clothing Manipulation and hygiene: with min assist;sitting/lateral leans   OT Frequency:  Min 2X/week  AM-PAC OT 6 Clicks Daily Activity     Outcome Measure Help from another person  eating meals?: A Little Help from another person taking care of personal grooming?: A Little Help from another person toileting, which includes using toliet, bedpan, or urinal?: A Lot Help from another person bathing (including washing, rinsing, drying)?: A Lot Help from another person to put on and taking off regular upper body clothing?: A Lot Help from another person to put on and taking off regular lower body clothing?: A Lot 6 Click Score: 14   End of Session Equipment Utilized During Treatment: Gait belt Nurse Communication: Mobility status  Activity Tolerance: Patient tolerated treatment well Patient left: in chair;with call bell/phone within reach;with chair alarm set;with family/visitor present  OT Visit Diagnosis: Unsteadiness on feet (R26.81);Cognitive communication deficit (R41.841)                Time: 1235-1310 OT Time Calculation (min): 35 min Charges:  OT General Charges $OT Visit: 1 Visit OT Evaluation $OT Eval Low Complexity: 1 Low OT Treatments $Self Care/Home Management : 8-22 mins  Idil Maslanka OT/L Acute Rehabilitation Department  [phone]  12/05/2024, 1:40 PM

## 2024-12-05 NOTE — Evaluation (Signed)
 Physical Therapy Evaluation Patient Details Name: Jerry [name] MRN: [id number] DOB: 1936-04-09 Today's Date: 12/05/2024  History of Present Illness  88 y.o. male brought to the emergency room to be checked as he slid off the chair onto the floor.  Dx of AKI on CKD. Pt with medical history significant of dementia (lives with a caretaker 24/7), CKD stage IIIa, hyperlipidemia and hypertension.  Clinical Impression  Pt admitted with above diagnosis. Min hand held assist to ambulate 140', with trial of ambulating without hand held assist pt was mildly unsteady and reached for handrail. Hand held assist recommended at present for ambulation. Pt pleasant and cooperative. Family would like pt to return to his care home with [facility] services.  Pt currently with functional limitations due to the deficits listed below (see PT Problem List). Pt will benefit from acute skilled PT to increase their independence and safety with mobility to allow discharge.           If plan is discharge home, recommend the following: A little help with walking and/or transfers;A little help with bathing/dressing/bathroom;Assistance with cooking/housework;Assist for transportation;Help with stairs or ramp for entrance   Can travel by private vehicle        Equipment Recommendations None recommended by PT  Recommendations for Other Services       Functional Status Assessment Patient has had a recent decline in their functional status and demonstrates the ability to make significant improvements in function in a reasonable and predictable amount of time.     Precautions / Restrictions Precautions Precautions: Fall Recall of Precautions/Restrictions: Impaired Precaution/Restrictions Comments: daughter stated she's not aware of any falls in past 6 months other than one just prior to admission Restrictions Weight Bearing Restrictions Per Provider Order: No      Mobility  Bed Mobility Overal bed mobility: Needs  Assistance Bed Mobility: Supine to Sit     Supine to sit: Min assist     General bed mobility comments: assist to initiate movement and to raise trunk/pivot hips to EOB    Transfers Overall transfer level: Needs assistance Equipment used: 2 person hand held assist Transfers: Sit to/from Stand Sit to Stand: Min assist           General transfer comment: min A to power up and to steady    Ambulation/Gait Ambulation/Gait assistance: Min assist, Contact guard assist Gait Distance (Feet): 140 Feet Assistive device: 1 person hand held assist Gait Pattern/deviations: Step-through pattern, Decreased stride length Gait velocity: decr     General Gait Details: steady with single UE support, without UE support pt fatigued quickly was mildly unsteady and reached for handrail; recommend single UE support at present  Stairs            Wheelchair Mobility     Tilt Bed    Modified Rankin (Stroke Patients Only)       Balance Overall balance assessment: Needs assistance Sitting-balance support: Feet supported, No upper extremity supported Sitting balance-Leahy Scale: Good     Standing balance support: Single extremity supported, During functional activity Standing balance-Leahy Scale: Fair                               Pertinent Vitals/Pain Pain Assessment Breathing: normal Negative Vocalization: none Facial Expression: smiling or inexpressive Body Language: relaxed Consolability: no need to console PAINAD Score: 0    Home Living Family/patient expects to be discharged to:: Group home Living Arrangements: Non-relatives/Friends  Available Help at Discharge: Family;Available PRN/intermittently Type of Home: House Home Access: Stairs to enter   [employer] of Steps: 3   Home Layout: One level Home Equipment: None Additional Comments: lives in a care home per pt's daughter, older woman is 24/7 caregiver there; pt not able to provide  info 2* dementia    Prior Function Prior Level of Function : Independent/Modified Independent             Mobility Comments: walks in the home without AD, no falls in past 6 months other than one just PTA ADLs Comments: pt resistant to bathing at care home per daughter     Extremity/Trunk Assessment   Upper Extremity Assessment Upper Extremity Assessment: Defer to OT evaluation    Lower Extremity Assessment Lower Extremity Assessment: Overall WFL for tasks assessed    Cervical / Trunk Assessment Cervical / Trunk Assessment: Kyphotic (forward head, rounded shoulders)  Communication   Communication Communication: No apparent difficulties    Cognition Arousal: Alert Behavior During Therapy: WFL for tasks assessed/performed   PT - Cognitive impairments: History of cognitive impairments, Memory, Safety/Judgement, Problem solving, Orientation   Orientation impairments: Place, Time, Situation                     Following commands: Impaired Following commands impaired: Follows one step commands with increased time     Cueing Cueing Techniques: Verbal cues     General Comments      Exercises     Assessment/Plan    PT Assessment Patient needs continued PT services  PT Problem List Decreased activity tolerance;Decreased mobility;Decreased balance       PT Treatment Interventions Gait training;Therapeutic exercise;Therapeutic activities;Functional mobility training;Patient/family education    PT Goals (Current goals can be found in the Care Plan section)  Acute Rehab PT Goals Patient Stated Goal: return to care home PT Goal Formulation: With family Time For Goal Achievement: 12/19/24 Potential to Achieve Goals: Good    Frequency Min 2X/week     Co-evaluation               AM-PAC PT 6 Clicks Mobility  Outcome Measure Help needed turning from your back to your side while in a flat bed without using bedrails?: None Help needed moving from  lying on your back to sitting on the side of a flat bed without using bedrails?: A Little Help needed moving to and from a bed to a chair (including a wheelchair)?: A Little Help needed standing up from a chair using your arms (e.g., wheelchair or bedside chair)?: A Little Help needed to walk in hospital room?: A Little Help needed climbing 3-5 steps with a railing? : A Little 6 Click Score: 19    End of Session Equipment Utilized During Treatment: Gait belt Activity Tolerance: Patient tolerated treatment well Patient left: in chair;with family/visitor present;with call bell/phone within reach (no batteries in chair alarm, no batteries on unit, secretary aware; daughter in room and will notify nursing if she leaves) Nurse Communication: Mobility status PT Visit Diagnosis: Difficulty in walking, not elsewhere classified (R26.2)    Time: 1130-1150 PT Time Calculation (min) (ACUTE ONLY): 20 min   Charges:   PT Evaluation $PT Eval Moderate Complexity: 1 Mod   PT General Charges $$ ACUTE PT VISIT: 1 Visit        Sylvan Delon Copp PT 12/05/2024  Acute Rehabilitation Services  Office [phone]

## 2024-12-05 NOTE — Care Management Obs Status (Signed)
 MEDICARE OBSERVATION STATUS NOTIFICATION   Patient Details  Name: PAYAM GRIBBLE MRN: [id number] Date of Birth: 10-Mar-1936   Medicare Observation Status Notification Given:  Yes    Doneta Glenys DASEN, RN 12/05/2024, 11:06 AM

## 2024-12-05 NOTE — Hospital Course (Addendum)
 Jerry [name] is a 88 y.o. male with medical history significant of dementia, CKD stage IIIa, hyperlipidemia and hypertension, who lives with a caretaker 24/7 brought to the emergency room to be checked as he slid off the chair onto the floor. Found to have Hypernatremia and an AKI. Getting IVF hydration and changed to D5W. Anticipate D/C Home with Home Health in the next 24 hours if further improved.  Assessment and Plan:  HyperNa+: Na+ Trend:  Recent Labs  Lab 12/04/24 1310 12/05/24 0437  NA 150* 149*  -Change IVF from D5 1/2 NS to just D5W @ 75 mL/hr. CTM and Trend and repeat BMP this evening and CMP in the AM  Hyperchloremia: Mild. See above  Generalized Weakness and Physical Deconditioning: IVF as above. PT Recommending Home Health. Check Orthostatic VS. Per report slid off the chair.   AKI on CKD Stage 3a: Baseline Cr ~ 1.2-1.4. BUN/Cr went from 13/1.62 -> 12/1.50. Given a 500 mL bolus. IVF as above. Avoid Nephrotoxic Medications, Contrast Dyes, Hypotension and Dehydration to Ensure Adequate Renal Perfusion and will need to Renally Adjust Meds -Continue to Monitor and Trend Renal Function carefully and repeat CMP in the AM   Severe Dementia with Behavior Disturbances: Anticipate more delirium and confusion while in the hospital.  Family to spend night in the hospital.  Patient is on Trazodone  50 mg po qHS, Donepezil  10 mg po qHS and Buspirone  5 mg po BID. -Will use Haldol  2 mg po q6h prn as needed for severe agitation.   -Delirium precautions.  Fall precaution. PT Recommending Home Health. OT evaluation pending  -U/A Negative for Infection.    Essential HTN: C/w Amlodipine  5 mg po at bedtime and Irbesartan  300 mg po at bedtime combo. CTM BP per Protocol. Last BP reading was 130/60   Hyperlipidemia: C/w Rosuvastatin  20 mg po at bedtime  Overweight: Complicates overall prognosis and care. Estimated body mass index is 27.17 kg/m as calculated from the following:   Height as of  this encounter: 5' 10 (1.778 m).   Weight as of this encounter: 85.9 kg. Weight Loss and Dietary Counseling given

## 2024-12-05 NOTE — Evaluation (Signed)
 SLP Cancellation Note  Patient Details Name: Jerry [name] MRN: [id number] DOB: 1936-03-03   Cancelled treatment:       Reason Eval/Treat Not Completed: Other (comment) (cog eval received, pt has baseine dementia and has 24/7 caretgivers, if evaluation indicated may be completed in his home environment, please reorder if needed)    Nicolas Emmie Caldron 12/05/2024, 8:05 AM   Madelin POUR, MS [facility] SLP Acute Rehab Services Office [phone]

## 2024-12-05 NOTE — Progress Notes (Signed)
 PROGRESS NOTE    Jerry [name]  FMW:[id number] DOB: Oct 07, 1936 DOA: 12/04/2024 PCP: Katrinka Garnette KIDD, MD   Brief Narrative:  Jerry [name] is a 88 y.o. male with medical history significant of dementia, CKD stage IIIa, hyperlipidemia and hypertension, who lives with a caretaker 24/7 brought to the emergency room to be checked as he slid off the chair onto the floor. Found to have Hypernatremia and an AKI. Getting IVF hydration and changed to D5W. Anticipate D/C Home with Home Health in the next 24 hours if further improved.  Assessment and Plan:  HyperNa+: Na+ Trend:  Recent Labs  Lab 12/04/24 1310 12/05/24 0437  NA 150* 149*  -Change IVF from D5 1/2 NS to just D5W @ 75 mL/hr. CTM and Trend and repeat BMP this evening and CMP in the AM  Hyperchloremia: Mild. See above  Generalized Weakness and Physical Deconditioning: IVF as above. PT Recommending Home Health. Check Orthostatic VS. Per report slid off the chair.   AKI on CKD Stage 3a: Baseline Cr ~ 1.2-1.4. BUN/Cr went from 13/1.62 -> 12/1.50. Given a 500 mL bolus. IVF as above. Avoid Nephrotoxic Medications, Contrast Dyes, Hypotension and Dehydration to Ensure Adequate Renal Perfusion and will need to Renally Adjust Meds -Continue to Monitor and Trend Renal Function carefully and repeat CMP in the AM   Severe Dementia with Behavior Disturbances: Anticipate more delirium and confusion while in the hospital.  Family to spend night in the hospital.  Patient is on Trazodone  50 mg po qHS, Donepezil  10 mg po qHS and Buspirone  5 mg po BID. -Will use Haldol  2 mg po q6h prn as needed for severe agitation.   -Delirium precautions.  Fall precaution. PT Recommending Home Health. OT evaluation pending  -U/A Negative for Infection.    Essential HTN: C/w Amlodipine  5 mg po at bedtime and Irbesartan  300 mg po at bedtime combo. CTM BP per Protocol. Last BP reading was 130/60   Hyperlipidemia: C/w Rosuvastatin  20 mg po at  bedtime  Overweight: Complicates overall prognosis and care. Estimated body mass index is 27.17 kg/m as calculated from the following:   Height as of this encounter: 5' 10 (1.778 m).   Weight as of this encounter: 85.9 kg. Weight Loss and Dietary Counseling given   DVT prophylaxis: enoxaparin  (LOVENOX ) injection 40 mg Start: 12/04/24 2200    Code Status: Full Code Family Communication: D/w Daughter @ bedside   Disposition Plan:  Level of care: Med-Surg Status is: Inpatient Remains inpatient appropriate because: Is further clinical improvement in sodium but anticipating discharge in the next 24 hours   Consultants:  None  Procedures:  As delineated as above  Antimicrobials:  Anti-infectives (From admission, onward)    None       Subjective: Seen and examined at bedside and is resting and in no acute distress.  Denies any pain.  No nausea or vomiting.  No other concerns or complaints at this time.  Still remains somewhat confused.   Objective: Vitals:   12/04/24 2213 12/05/24 0246 12/05/24 0646 12/05/24 1124  BP: 134/63 (!) 137/53 (!) 160/70 130/60  Pulse: 71 (!) 56 70 63  Resp:  18 18   Temp: 97.9 F (36.6 C) (!) 97.5 F (36.4 C) (!) 97.5 F (36.4 C) 97.6 F (36.4 C)  TempSrc: Axillary     SpO2: 97% 98% 100% 99%  Weight:      Height:        Intake/Output Summary (Last 24 hours) at 12/05/2024 1249  Last data filed at 12/05/2024 0943 Gross per 24 hour  Intake 555.8 ml  Output --  Net 555.8 ml   Filed Weights   12/04/24 1824  Weight: 85.9 kg   Examination: Physical Exam:  Constitutional: WN/WD overweight pleasantly demented male in no acute distress Respiratory: Diminished to auscultation bilaterally, no wheezing, rales, rhonchi or crackles. Normal respiratory effort and patient is not tachypenic. No accessory muscle use.  Unlabored breathing Cardiovascular: RRR, no murmurs / rubs / gallops. S1 and S2 auscultated. No extremity edema.   Abdomen: Soft,  non-tender, distended secondary to body habitus.  Bowel sounds positive.  GU: Deferred. Musculoskeletal: No clubbing / cyanosis of digits/nails. No joint deformity upper and lower extremities. Skin: No rashes, lesions, ulcers on limited skin evaluation. No induration; Warm and dry.  Neurologic: CN 2-12 grossly intact with no focal deficits. Romberg sign and cerebellar reflexes not assessed.  Psychiatric: Normal judgment and insight. Alert and oriented x 3. Normal mood and appropriate affect.   Data Reviewed: I have personally reviewed following labs and imaging studies  CBC: Recent Labs  Lab 12/04/24 1310  WBC 10.0  NEUTROABS 7.1  HGB 13.9  HCT 43.6  MCV 95.4  PLT 186   Basic Metabolic Panel: Recent Labs  Lab 12/04/24 1310 12/05/24 0437  NA 150* 149*  K 3.8 3.8  CL 116* 118*  CO2 24 23  GLUCOSE 91 82  BUN 13 12  CREATININE 1.62* 1.50*  CALCIUM  9.1 8.6*   GFR: Estimated Creatinine Clearance: 36.5 mL/min (A) (by C-G formula based on SCr of 1.5 mg/dL (H)). Liver Function Tests: Recent Labs  Lab 12/04/24 1310  AST 40  ALT 29  ALKPHOS 79  BILITOT 0.4  PROT 7.1  ALBUMIN 3.9   No results for input(s): LIPASE, AMYLASE in the last 168 hours. No results for input(s): AMMONIA in the last 168 hours. Coagulation Profile: No results for input(s): INR, PROTIME in the last 168 hours. Cardiac Enzymes: No results for input(s): CKTOTAL, CKMB, CKMBINDEX, TROPONINI in the last 168 hours. BNP (last 3 results) No results for input(s): PROBNP in the last 8760 hours. HbA1C: No results for input(s): HGBA1C in the last 72 hours. CBG: No results for input(s): GLUCAP in the last 168 hours. Lipid Profile: No results for input(s): CHOL, HDL, LDLCALC, TRIG, CHOLHDL, LDLDIRECT in the last 72 hours. Thyroid  Function Tests: No results for input(s): TSH, T4TOTAL, FREET4, T3FREE, THYROIDAB in the last 72 hours. Anemia Panel: No results for  input(s): VITAMINB12, FOLATE, FERRITIN, TIBC, IRON, RETICCTPCT in the last 72 hours. Sepsis Labs: Recent Labs  Lab 12/04/24 1329  LATICACIDVEN 0.6   No results found for this or any previous visit (from the past 240 hours).   Radiology Studies: DG Chest Port 1 View Result Date: 12/04/2024 CLINICAL DATA:  Cough.  Fell. EXAM: PORTABLE CHEST 1 VIEW COMPARISON:  09/17/2022.  Chest CT dated 01/12/2024. FINDINGS: Stable right upper lobe linear scarring. Otherwise, clear lungs with normal vascularity. Poor inspiration. Normal-sized heart. Tortuous and partially calcified thoracic aorta. Thoracic spine degenerative changes. IMPRESSION: No acute abnormality. Electronically Signed   By: Elspeth Bathe M.D.   On: 12/04/2024 13:45   Scheduled Meds:  amLODipine   5 mg Oral QHS   And   irbesartan   300 mg Oral QHS   busPIRone   5 mg Oral BID   donepezil   10 mg Oral QHS   enoxaparin  (LOVENOX ) injection  40 mg Subcutaneous Q24H   multivitamin with minerals  1 tablet Oral Daily  rosuvastatin   20 mg Oral QHS   traZODone   50 mg Oral QHS   Continuous Infusions:  dextrose 75 mL/hr at 12/05/24 1031    LOS: 0 days   Alejandro Marker, DO [facility] Available via Epic secure chat 7am-7pm After these hours, please refer to coverage provider listed on [email or web address] 12/05/2024, 12:49 PM

## 2024-12-05 NOTE — Plan of Care (Signed)
  Problem: Clinical Measurements: Goal: Will remain free from infection Outcome: Progressing Goal: Respiratory complications will improve Outcome: Progressing Goal: Cardiovascular complication will be avoided Outcome: Progressing   Problem: Elimination: Goal: Will not experience complications related to urinary retention Outcome: Progressing

## 2024-12-05 NOTE — TOC Initial Note (Addendum)
 Transition of Care [facility]) - Initial/Assessment Note    Patient Details  Name: Jerry [name] MRN: [id number] Date of Birth: 23-Jun-1936  Transition of [facility]) CM/SW Contact:    Jerry Glenys DASEN, RN Phone Number: 12/05/2024, 10:57 AM  Clinical Narrative:                 MOON completed. Patients dlt Jerry [name] present in the room and called  Jerry [name](dlt), Emergency Contact [phone] on speaker. PTA lives in a home with a caregiver Jerry [name] [address] [phone]. Both sisters agreeable to [facility] service and no preference if that's a recommendation. IP CM will follow. 3:58 PM HH and DME (walker) recommended . Referrals sent via HUB  Expected Discharge Plan: Group Home [phone] Jerry [name] Dr) Barriers to Discharge: Continued Medical Work up   Patient Goals and CMS Choice Patient states their goals for this hospitalization and ongoing recovery are:: Return to group home. CMS Medicare.gov Compare Post Acute Care list provided to:: Patient Represenative (must comment) Jerry [name]) Choice offered to / list presented to : Adult Children [place] ownership interest in [facility].provided to:: Adult Children    Expected Discharge Plan and Services In-house Referral: NA Discharge Planning Services: CM Consult   Living arrangements for the past 2 months: Group Home                 DME Arranged: N/A DME Agency: NA       HH Arranged: NA HH Agency: NA        Prior Living Arrangements/Services Living arrangements for the past 2 months: Group Home Lives with:: Facility Resident Patient language and need for interpreter reviewed:: Yes Do you feel safe going back to the place where you live?: Yes      Need for Family Participation in Patient Care: Yes (Comment) Care giver support system in place?: Yes (comment) Current home services:  (NA) Criminal Activity/Legal Involvement Pertinent to Current Situation/Hospitalization: No - Comment as needed  Activities of  Daily Living   ADL Screening (condition at time of admission) Independently performs ADLs?: No Does the patient have a NEW difficulty with bathing/dressing/toileting/self-feeding that is expected to last >3 days?: Yes (Initiates electronic notice to provider for possible OT consult) Does the patient have a NEW difficulty with getting in/out of bed, walking, or climbing stairs that is expected to last >3 days?: Yes (Initiates electronic notice to provider for possible PT consult) Does the patient have a NEW difficulty with communication that is expected to last >3 days?: Yes (Initiates electronic notice to provider for possible SLP consult) Is the patient deaf or have difficulty hearing?: No Does the patient have difficulty seeing, even when wearing glasses/contacts?: No Does the patient have difficulty concentrating, remembering, or making decisions?: Yes  Permission Sought/Granted Permission sought to share information with : Case Manager Permission granted to share information with : Yes, Verbal Permission Granted  Share Information with NAME: Jerry [name] (dlt) [id number]           Emotional Assessment Appearance:: Well-Groomed, Appears stated age Attitude/Demeanor/Rapport: Engaged Affect (typically observed): Appropriate Orientation: : Oriented to Self, Oriented to Place, Oriented to  Time, Oriented to Situation Alcohol / Substance Use: Not Applicable Psych Involvement: No (comment)  Admission diagnosis:  Dehydration [E86.0] Patient Active Problem List   Diagnosis Date Noted   Dehydration 12/04/2024   Urinary incontinence 08/02/2022   Aortic atherosclerosis 01/06/2018   History of lung cancer 09/18/2017   Pulmonary emphysema (HCC)  Parotiditis 02/08/2017   CKD (chronic kidney disease), stage III (HCC) 02/08/2017   Severe dementia (HCC) 07/17/2015   Hyperlipidemia 10/15/2014   Allergic rhinitis 10/15/2014   Overactive bladder 10/15/2014   CARDIAC MURMUR 01/10/2011    ORGANIC IMPOTENCE 08/12/2010   Gout of big toe 08/04/2008   Essential hypertension 07/25/2007   PROSTATE CANCER, HX OF 07/25/2007   PCP:  Jerry Garnette KIDD, MD Pharmacy:   [facility] - [place], [place] - [address] [place] [address] Phone: [phone] Fax: [phone]     Social Drivers of Health (SDOH) Social History: SDOH Screenings   Food Insecurity: No Food Insecurity (12/04/2024)  Housing: Low Risk (12/04/2024)  Transportation Needs: No Transportation Needs (12/04/2024)  Utilities: Not At Risk (12/04/2024)  Depression (PHQ2-9): Low Risk (08/13/2024)  Social Connections: Moderately Isolated (12/04/2024)  Tobacco Use: Medium Risk (12/04/2024)   SDOH Interventions:     Readmission Risk Interventions     No data to display

## 2024-12-06 ENCOUNTER — Telehealth: Payer: Self-pay

## 2024-12-06 ENCOUNTER — Inpatient Hospital Stay (HOSPITAL_COMMUNITY)

## 2024-12-06 LAB — CBC WITH DIFFERENTIAL/PLATELET
Abs Immature Granulocytes: 0.02 K/uL (ref 0.00–0.07)
Basophils Absolute: 0.1 K/uL (ref 0.0–0.1)
Basophils Relative: 1 %
Eosinophils Absolute: 0.4 K/uL (ref 0.0–0.5)
Eosinophils Relative: 6 %
HCT: 40.5 % (ref 39.0–52.0)
Hemoglobin: 12.9 g/dL — ABNORMAL LOW (ref 13.0–17.0)
Immature Granulocytes: 0 %
Lymphocytes Relative: 31 %
Lymphs Abs: 2 K/uL (ref 0.7–4.0)
MCH: 30.2 pg (ref 26.0–34.0)
MCHC: 31.9 g/dL (ref 30.0–36.0)
MCV: 94.8 fL (ref 80.0–100.0)
Monocytes Absolute: 0.4 K/uL (ref 0.1–1.0)
Monocytes Relative: 7 %
Neutro Abs: 3.5 K/uL (ref 1.7–7.7)
Neutrophils Relative %: 55 %
Platelets: 169 K/uL (ref 150–400)
RBC: 4.27 MIL/uL (ref 4.22–5.81)
RDW: 13.6 % (ref 11.5–15.5)
WBC: 6.3 K/uL (ref 4.0–10.5)
nRBC: 0 % (ref 0.0–0.2)

## 2024-12-06 LAB — COMPREHENSIVE METABOLIC PANEL WITH GFR
ALT: 36 U/L (ref 0–44)
AST: 52 U/L — ABNORMAL HIGH (ref 15–41)
Albumin: 3.1 g/dL — ABNORMAL LOW (ref 3.5–5.0)
Alkaline Phosphatase: 70 U/L (ref 38–126)
Anion gap: 8 (ref 5–15)
BUN: 11 mg/dL (ref 8–23)
CO2: 24 mmol/L (ref 22–32)
Calcium: 8.3 mg/dL — ABNORMAL LOW (ref 8.9–10.3)
Chloride: 114 mmol/L — ABNORMAL HIGH (ref 98–111)
Creatinine, Ser: 1.28 mg/dL — ABNORMAL HIGH (ref 0.61–1.24)
GFR, Estimated: 55 mL/min — ABNORMAL LOW (ref >60)
Glucose, Bld: 88 mg/dL (ref 70–99)
Potassium: 3.4 mmol/L — ABNORMAL LOW (ref 3.5–5.1)
Sodium: 146 mmol/L — ABNORMAL HIGH (ref 135–145)
Total Bilirubin: 0.3 mg/dL (ref 0.0–1.2)
Total Protein: 5.7 g/dL — ABNORMAL LOW (ref 6.5–8.1)

## 2024-12-06 LAB — PHOSPHORUS: Phosphorus: 2.9 mg/dL (ref 2.5–4.6)

## 2024-12-06 LAB — MAGNESIUM: Magnesium: 2.4 mg/dL (ref 1.7–2.4)

## 2024-12-06 MED ORDER — ADULT MULTIVITAMIN LIQUID CH
15.0000 mL | Freq: Every day | ORAL | Status: DC
  Administered 2024-12-06 – 2024-12-07 (×2): 15 mL via ORAL
  Filled 2024-12-06 (×2): qty 15

## 2024-12-06 MED ORDER — DEXTROSE 5 % IV SOLN: INTRAVENOUS | Status: AC

## 2024-12-06 MED ORDER — POTASSIUM CHLORIDE 20 MEQ PO PACK
40.0000 meq | PACK | Freq: Two times a day (BID) | ORAL | Status: AC
  Administered 2024-12-06 (×2): 40 meq via ORAL
  Filled 2024-12-06 (×2): qty 2

## 2024-12-06 MED ORDER — POTASSIUM CHLORIDE CRYS ER 20 MEQ PO TBCR: 40.0000 meq | EXTENDED_RELEASE_TABLET | Freq: Two times a day (BID) | ORAL | Status: DC

## 2024-12-06 NOTE — Telephone Encounter (Signed)
 Patient Name First: Jerry Last: [name] Gender: Male DOB: 12-17-1936 Age: 88 Y 4 M 25 D Return Phone Number: [phone] (Primary) Address: City/ State/ Zip: [place]  [address] Client [facility] [employer] Provider Katrinka Senior- MD Contact Type Call Who Is Calling Patient / Member / Family / Caregiver Call Type Triage / Clinical Caller Name Dyer Klug Caller Phone Number [phone] Relationship To Patient Spouse Return Phone Number [phone] (Primary) Chief Complaint Unclassified Symptom Reason for Call Symptomatic / Request for Health Information Initial Comment Caller states she would like to discuss her husbands treatment by the RN's in the hospital. He is currently admitted Translation No Disp. Time Titus Time) Disposition Final User 12/06/2024 1:23:48 AM Attempt made - message left Wiser, RN, Heidi 12/06/2024 1:40:11 AM Attempt made - message left Wiser, RN, Heidi 12/06/2024 2:13:10 AM FINAL ATTEMPT MADE - message left Yes Wiser, RN, Heidi Final Disposition 12/06/2024 2:13:10 AM FINAL ATTEMPT MADE - message left Yes Wiser, RN, [employer]

## 2024-12-06 NOTE — Plan of Care (Signed)

## 2024-12-06 NOTE — Plan of Care (Signed)
  Problem: Clinical Measurements: Goal: Will remain free from infection Outcome: Progressing Goal: Diagnostic test results will improve Outcome: Progressing   Problem: Nutrition: Goal: Adequate nutrition will be maintained Outcome: Progressing   Problem: Safety: Goal: Ability to remain free from injury will improve Outcome: Progressing   
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
## 2024-12-06 NOTE — Telephone Encounter (Signed)
 Copied from CRM #[id number]. Topic: General - Other >> Dec 06, 2024  4:13 PM Dedra B wrote: Reason for CRM: Leighanne from [facility] called to report a delay in start of care. They will not be able to see the pt until Monday.

## 2024-12-06 NOTE — Progress Notes (Signed)
 Pt family member at bedside. Discussed reasoning for IV access. Family member refused stick for IV. Unit RN notified and at bedside to speak with family member. Family member continues to refuse. Advise to consult again if family member becomes agreeable to IV placement.

## 2024-12-06 NOTE — Progress Notes (Signed)
 Patient agitated, continues to bed exit, sitter having difficulty encouraging patient to return to bed, Haldol  given per emar.

## 2024-12-06 NOTE — Telephone Encounter (Signed)
 Please call and see what concerns they have and any ways we might help them address (hospital has feedback channels for concerns from patients and family)

## 2024-12-06 NOTE — Progress Notes (Signed)
 PROGRESS NOTE    Jerry [name]  FMW:[id number] DOB: 09-04-1936 DOA: 12/04/2024 PCP: Katrinka Garnette KIDD, MD   Brief Narrative:  Jerry [name] is a 88 y.o. male with medical history significant of dementia, CKD stage IIIa, hyperlipidemia and hypertension, who lives with a caretaker 24/7 brought to the emergency room to be checked as he slid off the chair onto the floor. Found to have Hypernatremia and an AKI. Getting IVF hydration and changed to D5W but unfortunately did not have an IV For almost 15 hours so will resume IVF today. Anticipate D/C Home with Home Health in the next 24 hours if further improved.  Assessment and Plan:  HyperNa+: Na+ Trend:  Recent Labs  Lab 12/04/24 1310 12/05/24 0437 12/05/24 1249 12/06/24 0418  NA 150* 149* 149* 146*  -Change IVF from D5 1/2 NS to just D5W @ 75 mL/hr. Unfortunately D5W did not run from 9 pm on 12/11 until noon 12/12 due to lack of IV Access; Continue IVF hydration.  CTM and Trend and repeat BMP this evening and CMP in the AM  Hyperchloremia: Mild. See above and is improving and went from 116 -> 118 -> 116 -> 114. CTM and Trend and repeat CMP in the AM  Hypokalemia: K+ was 3.4. Replete with po KCL 40 mEQ BID x2. CTM and replete as Necessary. Repeat CMP in the AM  Generalized Weakness and Physical Deconditioning: IVF as above. PT Recommending Home Health. Check Orthostatic VS. Per report slid off the chair PTA.   AKI on CKD Stage 3a: Baseline Cr ~ 1.2-1.4. BUN/Cr went from 13/1.62 -> 12/1.50 -> 11/1.28. Given a 500 mL bolus. IVF as above. Avoid Nephrotoxic Medications, Contrast Dyes, Hypotension and Dehydration to Ensure Adequate Renal Perfusion and will need to Renally Adjust Meds -CTM & Trend Renal Function carefully & repeat CMP in the AM   Severe Dementia with Behavior Disturbances: Anticipate more delirium and confusion while in the hospital.  Family to spend night in the hospital.  Patient is on Trazodone  50 mg po qHS, Donepezil  10  mg po qHS and Buspirone  5 mg po BID. -Will use Haldol  2 mg po q6h prn as needed for severe agitation but given lack of IV will also add IM  -SLP evaluated and recommending Regular and Thin Liquid -Delirium precautions.  Fall precaution. PT Recommending Home Health. OT evaluation pending  -U/A Negative for Infection.   Elevated AST: Mild. AST is now 52. CTM and Trend and repeat CMP in the AM and if not improving or worsening will obtain RUQ U/S and an Acute Hepatitis Panel   Normocytic Anemia: Hgb/Hct Level went from 13.9/43.6 -> 12.9/40.5. Check Anemia Panel in the AM. CTM for S/Sx of Bleeding; No overt bleeding noted. Repeat CBC in the AM  Essential HTN: C/w Amlodipine  5 mg po at bedtime and Irbesartan  300 mg po at bedtime combo. CTM BP per Protocol. Last BP reading was 131/65   Hyperlipidemia: C/w Rosuvastatin  20 mg po at bedtime  Hypoalbuminemia: Patient's Albumin Lvl went from 3.9 -> 3.1. CTM & Trend & repeat CMP in the AM  Overweight: Complicates overall prognosis and care. Estimated body mass index is 27.17 kg/m as calculated from the following:   Height as of this encounter: 5' 10 (1.778 m).   Weight as of this encounter: 85.9 kg. Weight Loss and Dietary Counseling given   DVT prophylaxis: enoxaparin  (LOVENOX ) injection 40 mg Start: 12/04/24 2200    Code Status: Full Code Family Communication: No family  present @ bedside   Disposition Plan:  Level of care: Med-Surg Status is: Inpatient Remains inpatient appropriate because: Needs further clinical improvement in his sodium and anticipating discharge in next 24 hours if normalized   Consultants:  None  Procedures:  As delineated as above  Antimicrobials:  Anti-infectives (From admission, onward)    None       Subjective: Seen and examined at bedside and was resting.  Daughter at bedside and states that his IV had issues last night and she did not want up for him given that she is anxious.  Now agreeable for.  No  lightheadedness or dizziness.  Patient denies any complaints and wanted to sleep  Objective: Vitals:   12/05/24 1124 12/05/24 1939 12/06/24 0515 12/06/24 1202  BP: 130/60 133/67 (!) 143/74 131/65  Pulse: 63 80 64 62  Resp:  18 18 18   Temp: 97.6 F (36.4 C) 98.6 F (37 C) 98.2 F (36.8 C) 97.8 F (36.6 C)  TempSrc:  Oral    SpO2: 99% 100% 98% 100%  Weight:      Height:        Intake/Output Summary (Last 24 hours) at 12/06/2024 1732 Last data filed at 12/06/2024 1617 Gross per 24 hour  Intake 957 ml  Output --  Net 957 ml   Filed Weights   12/04/24 1824  Weight: 85.9 kg   Examination: Physical Exam:  Constitutional: WN/WD overweight pleasantly demented male in no acute distress resting Respiratory: Diminished to auscultation bilaterally, no wheezing, rales, rhonchi or crackles. Normal respiratory effort and patient is not tachypenic. No accessory muscle use.  Unlabored breathing Cardiovascular: RRR, no murmurs / rubs / gallops. S1 and S2 auscultated. No extremity edema. Abdomen: Soft, non-tender, distended secondary body habitus. Bowel sounds positive.  GU: Deferred. Musculoskeletal: No clubbing / cyanosis of digits/nails. No joint deformity upper and lower extremities.  Skin: No rashes, lesions, ulcers on limited skin evaluation. No induration; Warm and dry.  Neurologic: CN 2-12 grossly intact with no focal deficits. Romberg sign and cerebellar reflexes not assessed.  Psychiatric: Normal judgment and insight. Alert and oriented x 3. Normal mood and appropriate affect.   Data Reviewed: I have personally reviewed following labs and imaging studies  CBC: Recent Labs  Lab 12/04/24 1310 12/06/24 0418  WBC 10.0 6.3  NEUTROABS 7.1 3.5  HGB 13.9 12.9*  HCT 43.6 40.5  MCV 95.4 94.8  PLT 186 169   Basic Metabolic Panel: Recent Labs  Lab 12/04/24 1310 12/05/24 0437 12/05/24 1249 12/06/24 0418  NA 150* 149* 149* 146*  K 3.8 3.8 3.5 3.4*  CL 116* 118* 116* 114*   CO2 24 23 24 24   GLUCOSE 91 82 104* 88  BUN 13 12 11 11   CREATININE 1.62* 1.50* 1.36* 1.28*  CALCIUM  9.1 8.6* 8.6* 8.3*  MG  --   --   --  2.4  PHOS  --   --   --  2.9   GFR: Estimated Creatinine Clearance: 42.8 mL/min (A) (by C-G formula based on SCr of 1.28 mg/dL (H)). Liver Function Tests: Recent Labs  Lab 12/04/24 1310 12/06/24 0418  AST 40 52*  ALT 29 36  ALKPHOS 79 70  BILITOT 0.4 0.3  PROT 7.1 5.7*  ALBUMIN 3.9 3.1*   No results for input(s): LIPASE, AMYLASE in the last 168 hours. No results for input(s): AMMONIA in the last 168 hours. Coagulation Profile: No results for input(s): INR, PROTIME in the last 168 hours. Cardiac Enzymes: No results  for input(s): CKTOTAL, CKMB, CKMBINDEX, TROPONINI in the last 168 hours. BNP (last 3 results) No results for input(s): PROBNP in the last 8760 hours. HbA1C: No results for input(s): HGBA1C in the last 72 hours. CBG: No results for input(s): GLUCAP in the last 168 hours. Lipid Profile: No results for input(s): CHOL, HDL, LDLCALC, TRIG, CHOLHDL, LDLDIRECT in the last 72 hours. Thyroid  Function Tests: No results for input(s): TSH, T4TOTAL, FREET4, T3FREE, THYROIDAB in the last 72 hours. Anemia Panel: No results for input(s): VITAMINB12, FOLATE, FERRITIN, TIBC, IRON, RETICCTPCT in the last 72 hours. Sepsis Labs: Recent Labs  Lab 12/04/24 1329  LATICACIDVEN 0.6   No results found for this or any previous visit (from the past 240 hours).   Radiology Studies: No results found.  Scheduled Meds:  amLODipine   5 mg Oral QHS   And   irbesartan   300 mg Oral QHS   busPIRone   5 mg Oral BID   donepezil   10 mg Oral QHS   enoxaparin  (LOVENOX ) injection  40 mg Subcutaneous Q24H   multivitamin  15 mL Oral Daily   potassium chloride  40 mEq Oral BID   rosuvastatin   20 mg Oral QHS   traZODone   50 mg Oral QHS   Continuous Infusions:  dextrose 100 mL/hr at 12/06/24 1518     LOS: 1 day   Alejandro Marker, DO [facility] Available via Epic secure chat 7am-7pm After these hours, please refer to coverage provider listed on [email or web address] 12/06/2024, 5:32 PM

## 2024-12-06 NOTE — Evaluation (Addendum)
 Clinical/Bedside Swallow Evaluation Patient Details  Name: DUB MACLELLAN MRN: [id number] Date of Birth: 28-Dec-1935  Today's Date: 12/06/2024 Time: SLP Start Time (ACUTE ONLY): 0805 SLP Stop Time (ACUTE ONLY): 0826 SLP Time Calculation (min) (ACUTE ONLY): 21 min  Past Medical History:  Past Medical History:  Diagnosis Date   Cancer of prostate (HCC) 2001   Chronic kidney disease (CKD), stage III (moderate) (HCC)    thelbert 02/08/2017   Dementia (HCC)    DIVERTICULOSIS, COLON 07/25/2007   Qualifier: Diagnosis of  By: Trudy, LPN, Kendell HERO    High cholesterol    History of radiation therapy 05/01/17-05/09/17   right lung 54 Gy in 3 fractions   History of radiation therapy    prostate radioactive seed placement 07/2000  Dr Jason   Hypertension    Lung cancer (HCC) 03/2017   Past Surgical History:  Past Surgical History:  Procedure Laterality Date   DIRECT LARYNGOSCOPY N/A 06/21/2017   Procedure: DIRECT LARYNGOSCOPY;  Surgeon: Roark Rush, MD;  Location: [facility] OR;  Service: ENT;  Laterality: N/A;   EYE SURGERY     INSERTION PROSTATE RADIATION SEED  07/2000   thelbert 05/09/2011   PENILE PROSTHESIS IMPLANT  02/2002   Insertion of Mentor three piece inflatable penile prosthesis./notes 05/09/2011   PENILE PROSTHESIS PLACEMENT  06/2003   Placement of Mentor rod prosthesis/notes 05/09/2011   REFRACTIVE SURGERY Bilateral    REMOVAL OF PENILE PROSTHESIS  06/2003   Removal of three-piece Mentor prosthesis/notes 05/09/2011   SCROTAL SURGERY  06/2003   Repair of scrotal defect/notes 05/09/2011   HPI:  Jerry [name] is a 88 y.o. male who lives with a caretaker (lives in a private care home) 24/7 brought to the emergency room to be checked as he slid off the chair onto the floor.  Pt is diagnosed with dehydration.  PMH:  dementia, CKD stage IIIa, hyperlipidemia and hypertension.        Swallow evaluation ordered due to concern for difficulty swallowing pills.  Family member report pt had been  given medications with applesauce - crushed- with improved tolerance.   Pt with left facial asymmetry - - which daughter states has been progressive.    Assessment / Plan / Recommendation  Clinical Impression  Pt demonstrates overall functinal orophayngeal swallow ability based on clinical swallow evaluation. He is able to feed self and swallow appeared timely with clear voice throughout po intake.  Minimal prolonged mastication with minimal oral retention noted.  No s/s of aspiration. Pt with occasional eructation post-swallow - most notably with liquids.  Recommend pt continue diet with general aspiration precautions. Educated daughter, Meade, to potential compensation strategies that may be helpful to clear mouth as she report occasional holding.  No SLP follow up indicated as all education completed re: dementia and dysphagia.  Daughter in interested in brief follow up at next level of care for caregiver education - she reports caregiver chops foods for the pt. SLP Visit Diagnosis: Dysphagia, unspecified (R13.10)    Aspiration Risk  Mild aspiration risk    Diet Recommendation Regular;Thin liquid (to allow family to chose foods pt can manage)    Liquid Administration via: Cup;Straw Medication Administration: Crushed with puree Supervision: Patient able to self feed Compensations: Slow rate;Small sips/bites Postural Changes: Seated upright at 90 degrees;Remain upright for at least 30 minutes after po intake    Other Recommendations Oral Care Recommendations: Oral care BID     Swallow Evaluation Recommendations  N/a   Assistance Recommended at  Discharge  HH session x1 only for caregiver education  Functional Status Assessment Patient has had a recent decline in their functional status and demonstrates the ability to make significant improvements in function in a reasonable and predictable amount of time.    Swallow Study   General Date of Onset: 12/06/24 HPI: Jerry [name] is a 88  y.o. male who lives with a caretaker (lives in a private care home) 24/7 brought to the emergency room to be checked as he slid off the chair onto the floor.  Pt is diagnosed with dehydration.  PMH:  dementia, CKD stage IIIa, hyperlipidemia and hypertension.        Swallow evaluation ordered due to concern for difficulty swallowing pills.  Family member report pt had been given medications with applesauce - crushed- with improved tolerance. Type of Study: Bedside Swallow Evaluation Diet Prior to this Study: Regular;Thin liquids (Level 0) Temperature Spikes Noted: No Respiratory Status: Room air History of Recent Intubation: No Behavior/Cognition: Alert;Cooperative;Pleasant mood Oral Cavity Assessment: Within Functional Limits Oral Care Completed by SLP: No Oral Cavity - Dentition: Adequate natural dentition Vision: Functional for self-feeding Self-Feeding Abilities: Able to feed self Patient Positioning: Upright in bed Baseline Vocal Quality: Normal Volitional Cough: Cognitively unable to elicit Volitional Swallow: Unable to elicit    Oral/Motor/Sensory Function Overall Oral Motor/Sensory Function: Within functional limits   Ice Chips Ice chips: Not tested   Thin Liquid Thin Liquid: Within functional limits Presentation: Self Fed;Straw;Cup    Nectar Thick Nectar Thick Liquid: Not tested   Honey Thick Honey Thick Liquid: Not tested   Puree Puree: Within functional limits Presentation: Self Fed;Spoon   Solid     Solid: Impaired Oral Phase Impairments: Other (comment) Oral Phase Functional Implications: Oral residue Other Comments: Minimal oral retention -      Nicolas Emmie Caldron 12/06/2024,9:26 AM  Madelin POUR, MS [facility] SLP Acute Rehab Services Office [phone]

## 2024-12-06 NOTE — Telephone Encounter (Signed)
 Patient admitted to [facility] 12/10.

## 2024-12-06 NOTE — Progress Notes (Signed)
 2140 iv site infiltrated. I tried to insert an iv 2 times but was not successful. I told the family member that I will have to get someone else to come and try for an iv. She is in agreement because she understands that he needs the hydration. I removed the iv that was in the [facility].  2340 placed an order for the iv team to come a place one. Brandye from the iv team came to put in an iv. The family member refused even after explaining several times the need for the fluids. She keeps bringing up the Na number. His Na is fine. The doctor said 144 was good. Yes it is but he still needs to get the fluids. She said he has got stuck too many times today. A person should not have to be stuck so many times. I said I know but being in the hospital requires for him to have one. She still sad no. If any thing changes I will put an order in for the iv team. Will cont to monitor this shift.

## 2024-12-06 NOTE — TOC Progression Note (Addendum)
 Transition of Care [facility]) - Progression Note    Patient Details  Name: Jerry [name] MRN: [id number] Date of Birth: Dec 05, 1936  Transition of Care [facility]) CM/SW Contact  Doneta Glenys DASEN, RN Phone Number: 12/06/2024, 12:14 PM  Clinical Narrative:    CM spoke with patients daughter in the room. Discussed HH-[facility] and [facility]-3in1 delivered to room.  Expected Discharge Plan: Group Home ([address]) Barriers to Discharge: Continued Medical Work up               Expected Discharge Plan and Services In-house Referral: NA Discharge Planning Services: CM Consult   Living arrangements for the past 2 months: Group Home                 DME Arranged: N/A DME Agency: NA       HH Arranged: NA HH Agency: NA         Social Drivers of Health (SDOH) Interventions SDOH Screenings   Food Insecurity: No Food Insecurity (12/04/2024)  Housing: Low Risk (12/04/2024)  Transportation Needs: No Transportation Needs (12/04/2024)  Utilities: Not At Risk (12/04/2024)  Depression (PHQ2-9): Low Risk (08/13/2024)  Social Connections: Moderately Isolated (12/04/2024)  Tobacco Use: Medium Risk (12/04/2024)    Readmission Risk Interventions     No data to display

## 2024-12-07 LAB — COMPREHENSIVE METABOLIC PANEL WITH GFR
ALT: 59 U/L — ABNORMAL HIGH (ref 0–44)
AST: 79 U/L — ABNORMAL HIGH (ref 15–41)
Albumin: 3.3 g/dL — ABNORMAL LOW (ref 3.5–5.0)
Alkaline Phosphatase: 71 U/L (ref 38–126)
Anion gap: 9 (ref 5–15)
BUN: 9 mg/dL (ref 8–23)
CO2: 24 mmol/L (ref 22–32)
Calcium: 8.6 mg/dL — ABNORMAL LOW (ref 8.9–10.3)
Chloride: 110 mmol/L (ref 98–111)
Creatinine, Ser: 1.21 mg/dL (ref 0.61–1.24)
GFR, Estimated: 58 mL/min — ABNORMAL LOW (ref >60)
Glucose, Bld: 111 mg/dL — ABNORMAL HIGH (ref 70–99)
Potassium: 3.9 mmol/L (ref 3.5–5.1)
Sodium: 142 mmol/L (ref 135–145)
Total Bilirubin: 0.4 mg/dL (ref 0.0–1.2)
Total Protein: 6.2 g/dL — ABNORMAL LOW (ref 6.5–8.1)

## 2024-12-07 LAB — CBC WITH DIFFERENTIAL/PLATELET
Abs Immature Granulocytes: 0.03 K/uL (ref 0.00–0.07)
Basophils Absolute: 0.1 K/uL (ref 0.0–0.1)
Basophils Relative: 1 %
Eosinophils Absolute: 0.2 K/uL (ref 0.0–0.5)
Eosinophils Relative: 3 %
HCT: 40.9 % (ref 39.0–52.0)
Hemoglobin: 13.1 g/dL (ref 13.0–17.0)
Immature Granulocytes: 1 %
Lymphocytes Relative: 22 %
Lymphs Abs: 1.4 K/uL (ref 0.7–4.0)
MCH: 30.3 pg (ref 26.0–34.0)
MCHC: 32 g/dL (ref 30.0–36.0)
MCV: 94.5 fL (ref 80.0–100.0)
Monocytes Absolute: 0.4 K/uL (ref 0.1–1.0)
Monocytes Relative: 5 %
Neutro Abs: 4.4 K/uL (ref 1.7–7.7)
Neutrophils Relative %: 68 %
Platelets: 174 K/uL (ref 150–400)
RBC: 4.33 MIL/uL (ref 4.22–5.81)
RDW: 13.3 % (ref 11.5–15.5)
WBC: 6.5 K/uL (ref 4.0–10.5)
nRBC: 0 % (ref 0.0–0.2)

## 2024-12-07 LAB — MAGNESIUM: Magnesium: 2.3 mg/dL (ref 1.7–2.4)

## 2024-12-07 LAB — PHOSPHORUS: Phosphorus: 2.2 mg/dL — ABNORMAL LOW (ref 2.5–4.6)

## 2024-12-07 MED ORDER — K PHOS MONO-SOD PHOS DI & MONO 155-852-130 MG PO TABS
500.0000 mg | ORAL_TABLET | Freq: Two times a day (BID) | ORAL | Status: DC
  Administered 2024-12-07: 500 mg via ORAL
  Filled 2024-12-07 (×2): qty 2

## 2024-12-07 MED ORDER — MULTI-VITAMIN/MINERALS PO TABS: 1.0000 | ORAL_TABLET | Freq: Every day | ORAL | 0 refills | Status: AC

## 2024-12-07 MED ORDER — POTASSIUM PHOSPHATES 15 MMOLE/5ML IV SOLN: 20.0000 mmol | Freq: Once | INTRAVENOUS | Status: DC

## 2024-12-07 NOTE — Plan of Care (Signed)

## 2024-12-07 NOTE — TOC Transition Note (Addendum)
 Transition of Care [facility]) - Discharge Note   Patient Details  Name: Jerry [name] MRN: [id number] Date of Birth: 14-Sep-1936  Transition of Care [facility]) CM/SW Contact:  Sonda Manuella Quill, RN Phone Number: 12/07/2024, 5:09 PM   Clinical Narrative:    D/C orders received; [facility] services arranged w/ [facility]; LVM for [place] at agency; no IP CM needs.  -[phone]- return call from [facility] confirming receipt of VM. Final next level of care: Home w Home Health Services Barriers to Discharge: No Barriers Identified   Patient Goals and CMS Choice Patient states their goals for this hospitalization and ongoing recovery are:: Return to group home. CMS Medicare.gov Compare Post Acute Care list provided to:: Patient Represenative (must comment) [name]) Choice offered to / list presented to : Adult Children [place] ownership interest in [facility].provided to:: Adult Children    Discharge Placement                       Discharge Plan and Services Additional resources added to the After Visit Summary for   In-house Referral: NA Discharge Planning Services: CM Consult            DME Arranged: 3-N-1 DME Agency: [employer] Date DME Agency Contacted: 12/06/24 Time DME Agency Contacted: 1100 Representative spoke with at DME Agency: [facility] HH Arranged: PT, OT, Speech Therapy, Nurse's Aide, Social Work [facility] Agency: [facility] Date [facility] Agency Contacted: 12/06/24 Time HH Agency Contacted: 1000 Representative spoke with at [facility] Agency: Brandie  Social Drivers of Health (SDOH) Interventions SDOH Screenings   Food Insecurity: No Food Insecurity (12/04/2024)  Housing: Low Risk (12/04/2024)  Transportation Needs: No Transportation Needs (12/04/2024)  Utilities: Not At Risk (12/04/2024)  Depression (PHQ2-9): Low Risk (08/13/2024)  Social Connections: Moderately Isolated (12/04/2024)  Tobacco Use: Medium Risk (12/04/2024)     Readmission Risk  Interventions     No data to display

## 2024-12-09 ENCOUNTER — Telehealth: Payer: Self-pay | Admitting: *Deleted

## 2024-12-09 NOTE — Discharge Summary (Signed)
 Physician Discharge Summary   Patient: Jerry [name] MRN: [id number] DOB: [birth year]  Admit date:     12/04/2024  Discharge date: 12/07/2024  Discharge Physician: Alejandro Marker, DO   PCP: Katrinka Garnette KIDD, MD   Recommendations at discharge:   Follow-up with PCP within 1 to 2 weeks for repeat CBC CMP, mag, Phos within 1 week; If LFTs still elevated will need further outpatient Evaluation Follow-up with Neurology in outpatient setting within 1 to 2 weeks  Discharge Diagnoses: Principal Problem:   Dehydration Active Problems:   Essential hypertension   Hyperlipidemia   Severe dementia (HCC)   CKD (chronic kidney disease), stage III (HCC)  Resolved Problems:   * No resolved hospital problems. *  Hospital Course: Jerry [name] is a 88 y.o. male with medical history significant of dementia, CKD stage IIIa, hyperlipidemia and hypertension, who lives with a caretaker 24/7 brought to the emergency room to be checked as he slid off the chair onto the floor. Found to have Hypernatremia and an AKI. Getting IVF hydration and changed to D5W but unfortunately did not have an IV For almost 15 hours so will resume IVF today. Anticipate D/C Home with Home Health in the next 24 hours if further improved.  Assessment and Plan:  HyperNa+: Na+ Trend:  Recent Labs  Lab 12/04/24 1310 12/05/24 0437 12/05/24 1249 12/06/24 0418 12/07/24 1245  NA 150* 149* 149* 146* 142  -Change IVF from D5 1/2 NS to just D5W @ 75 mL/hr. Unfortunately D5W did not run from 9 pm on 12/11 until noon 12/12 due to lack of IV Access; Continue IVF hydration.  CTM and Trend and repeat BMP this evening and CMP in the AM  Hyperchloremia: Mild. See above and is improving and went from 116 -> 118 -> 116 -> 114. CTM and Trend and repeat CMP in the AM  Hypokalemia: K+ was 3.4. Replete with po KCL 40 mEQ BID x2. CTM and replete as Necessary. Repeat CMP in the AM  Generalized Weakness and Physical Deconditioning: IVF as  above. PT Recommending Home Health. Check Orthostatic VS. Per report slid off the chair PTA.   AKI on CKD Stage 3a: Baseline Cr ~ 1.2-1.4. BUN/Cr went from 13/1.62 -> 12/1.50 -> 11/1.28. Given a 500 mL bolus. IVF as above. Avoid Nephrotoxic Medications, Contrast Dyes, Hypotension and Dehydration to Ensure Adequate Renal Perfusion and will need to Renally Adjust Meds -CTM & Trend Renal Function carefully & repeat CMP in the AM   Severe Dementia with Behavior Disturbances: Anticipate more delirium and confusion while in the hospital.  Family to spend night in the hospital.  Patient is on Trazodone  50 mg po qHS, Donepezil  10 mg po qHS and Buspirone  5 mg po BID. -Will use Haldol  2 mg po q6h prn as needed for severe agitation but given lack of IV will also add IM  -SLP evaluated and recommending Regular and Thin Liquid -Delirium precautions.  Fall precaution. PT Recommending Home Health. OT evaluation pending  -U/A Negative for Infection.   Elevated AST: Mild. AST is now 52. CTM and Trend and repeat CMP in the AM and if not improving or worsening will obtain RUQ U/S and an Acute Hepatitis Panel   Normocytic Anemia: Hgb/Hct Level went from 13.9/43.6 -> 12.9/40.5. Check Anemia Panel in the AM. CTM for S/Sx of Bleeding; No overt bleeding noted. Repeat CBC in the AM  Essential HTN: C/w Amlodipine  5 mg po at bedtime and Irbesartan  300 mg po at bedtime combo.  CTM BP per Protocol. Last BP reading was 131/65   Hyperlipidemia: C/w Rosuvastatin  20 mg po at bedtime  Hypoalbuminemia: Patient's Albumin Lvl went from 3.9 -> 3.1. CTM & Trend & repeat CMP in the AM  Overweight: Complicates overall prognosis and care. Estimated body mass index is 27.17 kg/m as calculated from the following:   Height as of this encounter: 5' 10 (1.778 m).   Weight as of this encounter: 85.9 kg. Weight Loss and Dietary Counseling given   Consultants: None Procedures performed: ***  Disposition: {Plan; Disposition:26390} Diet  recommendation:  Discharge Diet Orders (From admission, onward)     Start     Ordered   12/07/24 0000  Diet - low sodium heart healthy        12/07/24 1407           {Diet_Plan:26776} DISCHARGE MEDICATION: Allergies as of 12/07/2024       Reactions   Namenda  [memantine ] Other (See Comments)   Made the patient feel dizzy        Medication List     PAUSE taking these medications    rosuvastatin  20 MG tablet Wait to take this until your doctor or other care provider tells you to start again. Commonly known as: CRESTOR  TAKE ONE TABLET BY MOUTH EVERY DAY What changed: when to take this       TAKE these medications    amLODipine -olmesartan  5-40 MG tablet Commonly known as: AZOR  Take 1 tablet by mouth daily. What changed: when to take this   benzonatate  100 MG capsule Commonly known as: Tessalon  Perles Take 1 capsule (100 mg total) by mouth 2 (two) times daily as needed for cough.   busPIRone  5 MG tablet Commonly known as: BUSPAR  Take 1 tablet (5 mg total) by mouth 2 (two) times daily. What changed: when to take this   diclofenac  Sodium 1 % Gel Commonly known as: VOLTAREN  Apply 2 g topically 4 (four) times daily.   donepezil  10 MG tablet Commonly known as: ARICEPT  TAKE ONE TABLET BY MOUTH EVERY DAY What changed: when to take this   ferrous sulfate  325 (65 FE) MG tablet Take three times a week   multivitamin with minerals tablet Take 1 tablet by mouth daily.   traZODone  100 MG tablet Commonly known as: DESYREL  Take 1 tablet (100 mg total) by mouth at bedtime. What changed: how much to take        Contact information for after-discharge care     Durable Medical Equipment     [facility] (DME) Follow up.   Service: Durable Medical Equipment Why: Will deliver 3in1 Bedside Commode to the patients room prior to discharge. Contact information: [address] [place]  [address] [phone]              Home Medical Care     [facility]) .   Service: Home Health Services Contact information: [address] [place]  [address] [phone]                    Discharge Exam: Jerry [name]   12/04/24 1824  Weight: 85.9 kg   ***  Condition at discharge: {DC Condition:26389}  The results of significant diagnostics from this hospitalization (including imaging, microbiology, ancillary and laboratory) are listed below for reference.   Imaging Studies: DG CHEST PORT 1 VIEW Result Date: 12/06/2024 EXAM: 1 VIEW XRAY OF THE CHEST 12/06/2024 12:14:00 PM COMPARISON: 12/04/2024  CLINICAL HISTORY: SOB (shortness of breath); Cough FINDINGS: LUNGS AND PLEURA: Low lung volumes. Similar-appearing linear atelectasis versus scarring of right upper lobe. No pleural effusion. No pneumothorax. HEART AND MEDIASTINUM: Aortic arch calcifications. No acute abnormality of the cardiac silhouette. BONES AND SOFT TISSUES: No acute osseous abnormality. IMPRESSION: 1. Low lung volumes with linear atelectasis versus scarring of the right upper lobe, similar to prior. Electronically signed by: Franky Stanford MD 12/06/2024 08:57 PM EST RP Workstation: [id number]   DG Chest Port 1 View Result Date: 12/04/2024 CLINICAL DATA:  Cough.  Fell. EXAM: PORTABLE CHEST 1 VIEW COMPARISON:  09/17/2022.  Chest CT dated 01/12/2024. FINDINGS: Stable right upper lobe linear scarring. Otherwise, clear lungs with normal vascularity. Poor inspiration. Normal-sized heart. Tortuous and partially calcified thoracic aorta. Thoracic spine degenerative changes. IMPRESSION: No acute abnormality. Electronically Signed   By: Elspeth Bathe M.D.   On: 12/04/2024 13:45    Microbiology: Results for orders placed or performed in visit on 10/18/23  Urine Culture     Status: None   Collection Time: 10/18/23 11:25 AM   Specimen: Urine  Result Value Ref Range Status   MICRO NUMBER:  [id number]  Final   SPECIMEN QUALITY: Adequate  Final   Sample Source URINE  Final   STATUS: FINAL  Final   Result: No Growth  Final    Labs: CBC: Recent Labs  Lab 12/04/24 1310 12/06/24 0418 12/07/24 1245  WBC 10.0 6.3 6.5  NEUTROABS 7.1 3.5 4.4  HGB 13.9 12.9* 13.1  HCT 43.6 40.5 40.9  MCV 95.4 94.8 94.5  PLT 186 169 174   Basic Metabolic Panel: Recent Labs  Lab 12/04/24 1310 12/05/24 0437 12/05/24 1249 12/06/24 0418 12/07/24 1245  NA 150* 149* 149* 146* 142  K 3.8 3.8 3.5 3.4* 3.9  CL 116* 118* 116* 114* 110  CO2 24 23 24 24 24   GLUCOSE 91 82 104* 88 111*  BUN 13 12 11 11 9   CREATININE 1.62* 1.50* 1.36* 1.28* 1.21  CALCIUM  9.1 8.6* 8.6* 8.3* 8.6*  MG  --   --   --  2.4 2.3  PHOS  --   --   --  2.9 2.2*   Liver Function Tests: Recent Labs  Lab 12/04/24 1310 12/06/24 0418 12/07/24 1245  AST 40 52* 79*  ALT 29 36 59*  ALKPHOS 79 70 71  BILITOT 0.4 0.3 0.4  PROT 7.1 5.7* 6.2*  ALBUMIN 3.9 3.1* 3.3*   CBG: No results for input(s): GLUCAP in the last 168 hours.  Discharge time spent: {LESS THAN/GREATER UYJW:73611} 30 minutes.  Signed: Alejandro Lazarus Marker, DO [facility] 12/09/2024

## 2024-12-09 NOTE — Transitions of Care (Post Inpatient/ED Visit) (Signed)
° °  12/09/2024  Name: EZRAEL SAM MRN: [id number] DOB: 1936-10-21  Today's TOC FU Call Status: Today's TOC FU Call Status:: Unsuccessful Call (1st Attempt) Unsuccessful Call (1st Attempt) Date: 12/09/24  Attempted to reach the patient regarding the most recent Inpatient/ED visit.  Follow Up Plan: Additional outreach attempts will be made to reach the patient to complete the Transitions of Care (Post Inpatient/ED visit) call.   Mliss Creed [facility], BSN RN Care Manager/ Transition of Care [facility] [phone]

## 2024-12-09 NOTE — Telephone Encounter (Signed)
 Left vm.

## 2024-12-10 ENCOUNTER — Telehealth: Payer: Self-pay | Admitting: *Deleted

## 2024-12-10 NOTE — Transitions of Care (Post Inpatient/ED Visit) (Signed)
 12/10/2024  Name: Jerry [name] MRN: [id number] DOB: 01/18/1936  Today's TOC FU Call Status: Today's TOC FU Call Status:: Successful TOC FU Call Completed TOC FU Call Complete Date: 12/10/24  Patient's Name and Date of Birth confirmed. Name, DOB (HIPAA verified per DPR daughter Allena Rea)  Transition Care Management Follow-up Telephone Call Date of Discharge: 12/07/24 Discharge Facility: [facility]) Type of Discharge: Inpatient Admission Primary Inpatient Discharge Diagnosis:: AKI, hypernatremia How have you been since you were released from the hospital?:  (eating, drinking well, no issues with bowel/ bladder, ambulating without difficulty) Any questions or concerns?: No  Items Reviewed: Did you receive and understand the discharge instructions provided?: Yes Medications obtained,verified, and reconciled?: Yes (Medications Reviewed) Any new allergies since your discharge?: No Dietary orders reviewed?: Yes Type of Diet Ordered:: heart healthy Do you have support at home?: Yes People in Home [RPT]: facility resident Name of Support/Comfort Primary Source: lives at a home setting that has several patients  Medications Reviewed Today: Medications Reviewed Today     Reviewed by Aura Mliss LABOR, RN (Registered Nurse) on 12/10/24 at 1518  Med List Status: <None>   Medication Order Taking? Sig Documenting Provider Last Dose Status Informant  amLODipine -olmesartan  (AZOR ) 5-40 MG tablet [id number] Yes Take 1 tablet by mouth daily. Katrinka Garnette KIDD, MD  Active Family Member  benzonatate  (TESSALON  PERLES) 100 MG capsule [id number]  Take 1 capsule (100 mg total) by mouth 2 (two) times daily as needed for cough.  Patient not taking: Reported on 12/10/2024   Katrinka Garnette KIDD, MD  Active Family Member  busPIRone  (BUSPAR ) 5 MG tablet [id number] Yes Take 1 tablet (5 mg total) by mouth 2 (two) times daily. Katrinka Garnette KIDD, MD  Active Family Member  diclofenac  Sodium (VOLTAREN ) 1 %  GEL [id number]  Apply 2 g topically 4 (four) times daily.  Patient not taking: Reported on 12/10/2024   Katrinka Garnette KIDD, MD  Active Family Member  donepezil  (ARICEPT ) 10 MG tablet [id number] Yes TAKE ONE TABLET BY MOUTH EVERY DAY  Patient taking differently: Take 10 mg by mouth at bedtime. Taking at bedtime   Katrinka Garnette KIDD, MD  Active Family Member  ferrous sulfate  325 [phone] FE) MG tablet [id number]  Take three times a week  Patient not taking: Reported on 12/10/2024   Katrinka Garnette KIDD, MD  Active Family Member  Multiple Vitamins-Minerals (MULTIVITAMIN WITH MINERALS) tablet [id number] Yes Take 1 tablet by mouth daily. Sherrill Cable [place], [place]  Active   rosuvastatin  (CRESTOR ) 20 MG tablet [id number] Yes TAKE ONE TABLET BY MOUTH EVERY DAY Katrinka Garnette KIDD, MD  Active Family Member  traZODone  (DESYREL ) 100 MG tablet [id number] Yes Take 1 tablet (100 mg total) by mouth at bedtime.  Patient taking differently: Take 50 mg by mouth at bedtime. Taking at bedtime   Katrinka Garnette KIDD, MD  Active Family Member            Home Care and Equipment/Supplies: Were Home Health Services Ordered?: Yes Name of Home Health Agency:: [facility] Has Agency set up a time to come to your home?: Yes First Home Health Visit Date:  (daughter states home health has seen pt, unsure of date) Any new equipment or medical supplies ordered?: No  Functional Questionnaire: Do you need assistance with bathing/showering or dressing?: Yes (family assist) Do you need assistance with meal preparation?: Yes ([facility] provides all meals) Do you need assistance with eating?: No Do you have difficulty maintaining continence: No Do you need  assistance with getting out of bed/getting out of a chair/moving?: No Do you have difficulty managing or taking your medications?: Yes (family provides oversight)  Follow up appointments reviewed: PCP Follow-up appointment confirmed?: Yes Date of PCP follow-up appointment?: 01/03/25 Follow-up  Provider: Dr. Garnette Lukes Specialist [facility] Follow-up appointment confirmed?: NA Do you need transportation to your follow-up appointment?: No Do you understand care options if your condition(s) worsen?: Yes-patient verbalized understanding  SDOH Interventions Today    Flowsheet Row Most Recent Value  SDOH Interventions   Food Insecurity Interventions Intervention Not Indicated  Housing Interventions Intervention Not Indicated  Transportation Interventions Intervention Not Indicated  Utilities Interventions Intervention Not Indicated    Goals Addressed             This Visit's Progress    VBCI Transitions of Care (TOC) Care Plan       Problems:  Recent Hospitalization for treatment of Hypernatremia, AKI No Hospital Follow Up Provider appointment daughter wants to keep scheduled appointment for 01/03/25, does not want a sooner appointment Pt lives in a home that several other people stay in so he has 24/7 care and 3 meals provided, daughters go over and assist with bathing, medications, etc Home health- [facility] is working with pt Spoke with patient's daughter [name] [name] DPR, all calls need to go through Ms. [name]  Goal:  Over the next 30 days, the patient will not experience hospital readmission  Interventions:  Evaluation of current treatment plan related to AKI, hypernatremia, self-management and patient's adherence to plan as established by provider. Discussed plans with patient for ongoing care management follow up and provided patient with direct contact information for care management team Evaluation of current treatment plan related to AKI, hypernatremia and patient's adherence to plan as established by provider Reviewed medications with patient and discussed importance of taking as prescribed Discussed plans with patient for ongoing care management follow up and provided patient with direct contact information for care management team Assessed social determinant  of health barriers Reviewed safety precautions Reviewed importance of staying well hydrated Reviewed signs/ symptoms of infection  Patient Self Care Activities:  Attend all scheduled provider appointments Call pharmacy for medication refills 3-7 days in advance of running out of medications Notify RN Care Manager of TOC call rescheduling needs Participate in Transition of Care Program/Attend TOC scheduled calls Take medications as prescribed    Plan:  Telephone follow up appointment with care management team member scheduled for:  12/24/24 @ 245 pm, will not call the week of Christmas, per daughter she would like one follow up call , then will decide if she wants any further follow up, agreeable to be called week of 12/23/24. The patient has been provided with contact information for the care management team and has been advised to call with any health related questions or concerns.         Mliss Creed [facility], BSN RN Care Manager/ Transition of Care [place] [phone]

## 2024-12-11 NOTE — Discharge Summary (Incomplete)
 Physician Discharge Summary   Patient: Jerry [name] MRN: [id number] DOB: [birth year]  Admit date:     12/04/2024  Discharge date: 12/07/2024  Discharge Physician: Alejandro Marker, DO   PCP: Katrinka Garnette KIDD, MD   Recommendations at discharge:   Follow-up with PCP within 1 to 2 weeks for repeat CBC CMP, mag, Phos within 1 week; If LFTs still elevated will need further outpatient Evaluation Follow-up with Neurology in outpatient setting within 1 to 2 weeks  Discharge Diagnoses: Principal Problem:   Dehydration Active Problems:   Essential hypertension   Hyperlipidemia   Severe dementia (HCC)   CKD (chronic kidney disease), stage III (HCC)  Resolved Problems:   * No resolved hospital problems. *  Hospital Course: Jerry [name] is a 87 y.o. male with medical history significant of dementia, CKD stage IIIa, hyperlipidemia and hypertension, who lives with a caretaker 24/7 brought to the emergency room to be checked as he slid off the chair onto the floor. Found to have Hypernatremia and an AKI. Getting IVF hydration and changed to D5W but unfortunately did not have an IV For almost 15 hours so will resume IVF today. Anticipate D/C Home with Home Health in the next 24 hours if further improved.  Assessment and Plan:  HyperNa+: Na+ Trend:  Recent Labs  Lab 12/04/24 1310 12/05/24 0437 12/05/24 1249 12/06/24 0418 12/07/24 1245  NA 150* 149* 149* 146* 142  -Change IVF from D5 1/2 NS to just D5W @ 75 mL/hr. Unfortunately D5W did not run from 9 pm on 12/11 until noon 12/12 due to lack of IV Access; Continue IVF hydration.  CTM and Trend and repeat BMP this evening and CMP in the AM  Hyperchloremia: Mild. See above and is improving and went from 116 -> 118 -> 116 -> 114. CTM and Trend and repeat CMP in the AM  Hypokalemia: K+ was 3.4. Replete with po KCL 40 mEQ BID x2. CTM and replete as Necessary. Repeat CMP in the AM  Generalized Weakness and Physical Deconditioning: IVF as  above. PT Recommending Home Health. Check Orthostatic VS. Per report slid off the chair PTA.   AKI on CKD Stage 3a: Baseline Cr ~ 1.2-1.4. BUN/Cr went from 13/1.62 -> 12/1.50 -> 11/1.28. Given a 500 mL bolus. IVF as above. Avoid Nephrotoxic Medications, Contrast Dyes, Hypotension and Dehydration to Ensure Adequate Renal Perfusion and will need to Renally Adjust Meds -CTM & Trend Renal Function carefully & repeat CMP in the AM   Severe Dementia with Behavior Disturbances: Anticipate more delirium and confusion while in the hospital.  Family to spend night in the hospital.  Patient is on Trazodone  50 mg po qHS, Donepezil  10 mg po qHS and Buspirone  5 mg po BID. -Will use Haldol  2 mg po q6h prn as needed for severe agitation but given lack of IV will also add IM  -SLP evaluated and recommending Regular and Thin Liquid -Delirium precautions.  Fall precaution. PT Recommending Home Health. OT evaluation pending  -U/A Negative for Infection.   Elevated AST: Mild. AST is now 52. CTM and Trend and repeat CMP in the AM and if not improving or worsening will obtain RUQ U/S and an Acute Hepatitis Panel   Normocytic Anemia: Hgb/Hct Level went from 13.9/43.6 -> 12.9/40.5. Check Anemia Panel in the AM. CTM for S/Sx of Bleeding; No overt bleeding noted. Repeat CBC in the AM  Essential HTN: C/w Amlodipine  5 mg po at bedtime and Irbesartan  300 mg po at bedtime combo.  CTM BP per Protocol. Last BP reading was 131/65   Hyperlipidemia: C/w Rosuvastatin  20 mg po at bedtime  Hypoalbuminemia: Patient's Albumin Lvl went from 3.9 -> 3.1. CTM & Trend & repeat CMP in the AM  Overweight: Complicates overall prognosis and care. Estimated body mass index is 27.17 kg/m as calculated from the following:   Height as of this encounter: 5' 10 (1.778 m).   Weight as of this encounter: 85.9 kg. Weight Loss and Dietary Counseling given   Consultants: None Procedures performed: ***  Disposition: {Plan; Disposition:26390} Diet  recommendation:  Discharge Diet Orders (From admission, onward)     Start     Ordered   12/07/24 0000  Diet - low sodium heart healthy        12/07/24 1407           {Diet_Plan:26776} DISCHARGE MEDICATION: Allergies as of 12/07/2024       Reactions   Namenda  [memantine ] Other (See Comments)   Made the patient feel dizzy        Medication List     PAUSE taking these medications    rosuvastatin  20 MG tablet Wait to take this until your doctor or other care provider tells you to start again. Commonly known as: CRESTOR  TAKE ONE TABLET BY MOUTH EVERY DAY What changed: when to take this       TAKE these medications    amLODipine -olmesartan  5-40 MG tablet Commonly known as: AZOR  Take 1 tablet by mouth daily. What changed: when to take this   benzonatate  100 MG capsule Commonly known as: Tessalon  Perles Take 1 capsule (100 mg total) by mouth 2 (two) times daily as needed for cough.   busPIRone  5 MG tablet Commonly known as: BUSPAR  Take 1 tablet (5 mg total) by mouth 2 (two) times daily. What changed: when to take this   diclofenac  Sodium 1 % Gel Commonly known as: VOLTAREN  Apply 2 g topically 4 (four) times daily.   donepezil  10 MG tablet Commonly known as: ARICEPT  TAKE ONE TABLET BY MOUTH EVERY DAY What changed: when to take this   ferrous sulfate  325 (65 FE) MG tablet Take three times a week   multivitamin with minerals tablet Take 1 tablet by mouth daily.   traZODone  100 MG tablet Commonly known as: DESYREL  Take 1 tablet (100 mg total) by mouth at bedtime. What changed: how much to take        Contact information for after-discharge care     Durable Medical Equipment     [facility] (DME) Follow up.   Service: Durable Medical Equipment Why: Will deliver 3in1 Bedside Commode to the patients room prior to discharge. Contact information: [address] [employer] [place]  [address] [phone]              Home Medical Care     [facility] [place] [facility]) .   Service: Home Health Services Contact information: [address] [place]  [address] [phone]                    Discharge Exam: Fredricka Weights   12/04/24 1824  Weight: 85.9 kg   ***  Condition at discharge: {DC Condition:26389}  The results of significant diagnostics from this hospitalization (including imaging, microbiology, ancillary and laboratory) are listed below for reference.   Imaging Studies: DG CHEST PORT 1 VIEW Result Date: 12/06/2024 EXAM: 1 VIEW XRAY OF THE CHEST 12/06/2024 12:14:00 PM COMPARISON: 12/04/2024  CLINICAL HISTORY: SOB (shortness of breath); Cough FINDINGS: LUNGS AND PLEURA: Low lung volumes. Similar-appearing linear atelectasis versus scarring of right upper lobe. No pleural effusion. No pneumothorax. HEART AND MEDIASTINUM: Aortic arch calcifications. No acute abnormality of the cardiac silhouette. BONES AND SOFT TISSUES: No acute osseous abnormality. IMPRESSION: 1. Low lung volumes with linear atelectasis versus scarring of the right upper lobe, similar to prior. Electronically signed by: Franky Stanford MD 12/06/2024 08:57 PM EST RP Workstation: [id number]   DG Chest Port 1 View Result Date: 12/04/2024 CLINICAL DATA:  Cough.  Fell. EXAM: PORTABLE CHEST 1 VIEW COMPARISON:  09/17/2022.  Chest CT dated 01/12/2024. FINDINGS: Stable right upper lobe linear scarring. Otherwise, clear lungs with normal vascularity. Poor inspiration. Normal-sized heart. Tortuous and partially calcified thoracic aorta. Thoracic spine degenerative changes. IMPRESSION: No acute abnormality. Electronically Signed   By: Elspeth Bathe M.D.   On: 12/04/2024 13:45    Microbiology: Results for orders placed or performed in visit on 10/18/23  Urine Culture     Status: None   Collection Time: 10/18/23 11:25 AM   Specimen: Urine  Result Value Ref Range Status   MICRO NUMBER:  [id number]  Final   SPECIMEN QUALITY: Adequate  Final   Sample Source URINE  Final   STATUS: FINAL  Final   Result: No Growth  Final    Labs: CBC: Recent Labs  Lab 12/04/24 1310 12/06/24 0418 12/07/24 1245  WBC 10.0 6.3 6.5  NEUTROABS 7.1 3.5 4.4  HGB 13.9 12.9* 13.1  HCT 43.6 40.5 40.9  MCV 95.4 94.8 94.5  PLT 186 169 174   Basic Metabolic Panel: Recent Labs  Lab 12/04/24 1310 12/05/24 0437 12/05/24 1249 12/06/24 0418 12/07/24 1245  NA 150* 149* 149* 146* 142  K 3.8 3.8 3.5 3.4* 3.9  CL 116* 118* 116* 114* 110  CO2 24 23 24 24 24   GLUCOSE 91 82 104* 88 111*  BUN 13 12 11 11 9   CREATININE 1.62* 1.50* 1.36* 1.28* 1.21  CALCIUM  9.1 8.6* 8.6* 8.3* 8.6*  MG  --   --   --  2.4 2.3  PHOS  --   --   --  2.9 2.2*   Liver Function Tests: Recent Labs  Lab 12/04/24 1310 12/06/24 0418 12/07/24 1245  AST 40 52* 79*  ALT 29 36 59*  ALKPHOS 79 70 71  BILITOT 0.4 0.3 0.4  PROT 7.1 5.7* 6.2*  ALBUMIN 3.9 3.1* 3.3*   CBG: No results for input(s): GLUCAP in the last 168 hours.  Discharge time spent: {LESS THAN/GREATER UYJW:73611} 30 minutes.  Signed: Alejandro Lazarus Marker, DO [facility] 12/09/2024

## 2024-12-17 ENCOUNTER — Encounter: Payer: Self-pay | Admitting: Family Medicine

## 2024-12-17 ENCOUNTER — Ambulatory Visit (INDEPENDENT_AMBULATORY_CARE_PROVIDER_SITE_OTHER): Admitting: Family Medicine

## 2024-12-17 VITALS — BP 134/70 | HR 67 | Temp 98.2°F | Ht 70.0 in | Wt 191.4 lb

## 2024-12-17 DIAGNOSIS — I10 Essential (primary) hypertension: Secondary | ICD-10-CM

## 2024-12-17 DIAGNOSIS — N1831 Chronic kidney disease, stage 3a: Secondary | ICD-10-CM

## 2024-12-17 DIAGNOSIS — E785 Hyperlipidemia, unspecified: Secondary | ICD-10-CM

## 2024-12-17 DIAGNOSIS — E870 Hyperosmolality and hypernatremia: Secondary | ICD-10-CM

## 2024-12-17 DIAGNOSIS — R7401 Elevation of levels of liver transaminase levels: Secondary | ICD-10-CM

## 2024-12-17 DIAGNOSIS — F03C0 Unspecified dementia, severe, without behavioral disturbance, psychotic disturbance, mood disturbance, and anxiety: Secondary | ICD-10-CM | POA: Diagnosis not present

## 2024-12-17 DIAGNOSIS — N179 Acute kidney failure, unspecified: Secondary | ICD-10-CM

## 2024-12-17 DIAGNOSIS — Z8639 Personal history of other endocrine, nutritional and metabolic disease: Secondary | ICD-10-CM | POA: Diagnosis not present

## 2024-12-17 LAB — COMPREHENSIVE METABOLIC PANEL WITH GFR
ALT: 113 U/L — ABNORMAL HIGH (ref 3–53)
AST: 85 U/L — ABNORMAL HIGH (ref 5–37)
Albumin: 3.6 g/dL (ref 3.5–5.2)
Alkaline Phosphatase: 68 U/L (ref 39–117)
BUN: 16 mg/dL (ref 6–23)
CO2: 26 meq/L (ref 19–32)
Calcium: 8.7 mg/dL (ref 8.4–10.5)
Chloride: 112 meq/L (ref 96–112)
Creatinine, Ser: 1.58 mg/dL — ABNORMAL HIGH (ref 0.40–1.50)
GFR: 39.38 mL/min — ABNORMAL LOW
Glucose, Bld: 101 mg/dL — ABNORMAL HIGH (ref 70–99)
Potassium: 4.1 meq/L (ref 3.5–5.1)
Sodium: 145 meq/L (ref 135–145)
Total Bilirubin: 0.5 mg/dL (ref 0.2–1.2)
Total Protein: 6.1 g/dL (ref 6.0–8.3)

## 2024-12-17 LAB — CBC WITH DIFFERENTIAL/PLATELET
Basophils Absolute: 0.1 K/uL (ref 0.0–0.1)
Basophils Relative: 0.9 % (ref 0.0–3.0)
Eosinophils Absolute: 0.2 K/uL (ref 0.0–0.7)
Eosinophils Relative: 3.4 % (ref 0.0–5.0)
HCT: 40.6 % (ref 39.0–52.0)
Hemoglobin: 13.3 g/dL (ref 13.0–17.0)
Lymphocytes Relative: 28.3 % (ref 12.0–46.0)
Lymphs Abs: 1.8 K/uL (ref 0.7–4.0)
MCHC: 32.7 g/dL (ref 30.0–36.0)
MCV: 91.8 fl (ref 78.0–100.0)
Monocytes Absolute: 0.3 K/uL (ref 0.1–1.0)
Monocytes Relative: 4.5 % (ref 3.0–12.0)
Neutro Abs: 4 K/uL (ref 1.4–7.7)
Neutrophils Relative %: 62.9 % (ref 43.0–77.0)
Platelets: 189 K/uL (ref 150.0–400.0)
RBC: 4.42 Mil/uL (ref 4.22–5.81)
RDW: 14.7 % (ref 11.5–15.5)
WBC: 6.4 K/uL (ref 4.0–10.5)

## 2024-12-17 LAB — PHOSPHORUS: Phosphorus: 3.4 mg/dL (ref 2.3–4.6)

## 2024-12-17 MED ORDER — TRAZODONE HCL 50 MG PO TABS: 50.0000 mg | ORAL_TABLET | Freq: Every day | ORAL | 3 refills | Status: DC

## 2024-12-17 NOTE — Assessment & Plan Note (Signed)
 Reasonable control on amlodipine  and olmesartan  5-40 mg daily

## 2024-12-17 NOTE — Progress Notes (Signed)
 " Phone [phone]   Subjective:  Jerry [name] is a 88 y.o. year old very pleasant male patient who presents for transitional care management and hospital follow up for hyponatremia. Patient was hospitalized from 12/04/2024 to 12/07/2024. A TCM phone call was completed on 12/10/2024 but attempt was made on 12/10/2019. Medical complexity moderate  Patient was taken to the hospital after sliding off the chair onto the floor.  Ultimately found to have hypernatremia as high as 150 and acute kidney injury.  He received IV fluid hydration initially with D5 half-normal saline that was changed to D5W but unfortunate there was a period of 15 hours he was off IV fluids completely due to access issues (he pulled access daughter reports).  With hydration sodium improved and normalized.  Hyperchloremia improved.  Hypokalemia improved with supplementation.  Creatinine peaked at 1.62 before improving to 1.21 at time of discharge. -They also recommend outpatient phosphate follow-up  Very mild liver function test elevation-they recommended repeat outpatient labs.  Depending on trend they recommended considering right upper quadrant ultrasound or hepatitis panel. They thought this could be related to Haldol  usage but they also held his statin rosuvastatin  20 mg daily.  Albumin was also low but was improving  he was discharged with PT and occupational therapy.  Low albumin was noted but was improving.  He did have behavioral disturbance related to his dementia with more delirium and confusion in the hospital and family had to spend time in the hospital with him.  He was continued on donepezil , buspirone  5 mg twice daily and trazodone .  Haldol  was written and he thought could be related to liver function test elevation.  Urinalysis negative for infection  For his hypertension was controlled with amlodipine  5 mg and irbesartan  300 mg- amlodipine  olmesartan  5-40 mg daily   See problem oriented charting as  well  Past Medical History-  Patient Active Problem List   Diagnosis Date Noted   History of lung cancer 09/18/2017    Priority: High   Severe dementia (HCC) 07/17/2015    Priority: High   CKD (chronic kidney disease), stage III (HCC) 02/08/2017    Priority: Medium    Hyperlipidemia 10/15/2014    Priority: Medium    Gout of big toe 08/04/2008    Priority: Medium    Essential hypertension 07/25/2007    Priority: Medium    PROSTATE CANCER, HX OF 07/25/2007    Priority: Medium    Aortic atherosclerosis 01/06/2018    Priority: Low   Pulmonary emphysema (HCC)     Priority: Low   Parotiditis 02/08/2017    Priority: Low   Allergic rhinitis 10/15/2014    Priority: Low   Overactive bladder 10/15/2014    Priority: Low   CARDIAC MURMUR 01/10/2011    Priority: Low   ORGANIC IMPOTENCE 08/12/2010    Priority: Low    Medications- reviewed and updated  A medical reconciliation was performed comparing current medicines to hospital discharge medications. Current Outpatient Medications  Medication Sig Dispense Refill   amLODipine -olmesartan  (AZOR ) 5-40 MG tablet Take 1 tablet by mouth daily. 90 tablet 3   busPIRone  (BUSPAR ) 5 MG tablet Take 1 tablet (5 mg total) by mouth 2 (two) times daily. 60 tablet 5   donepezil  (ARICEPT ) 10 MG tablet TAKE ONE TABLET BY MOUTH EVERY DAY (Patient taking differently: Take 10 mg by mouth at bedtime. Taking at bedtime) 90 tablet PRN   Multiple Vitamins-Minerals (MULTIVITAMIN WITH MINERALS) tablet Take 1 tablet by mouth daily. 120 tablet  0   [Paused] rosuvastatin  (CRESTOR ) 20 MG tablet TAKE ONE TABLET BY MOUTH EVERY DAY 90 tablet PRN   ferrous sulfate  325 (65 FE) MG tablet Take three times a week (Patient not taking: Reported on 12/17/2024) 40 tablet 3   traZODone  (DESYREL ) 50 MG tablet Take 1 tablet (50 mg total) by mouth at bedtime. Taking at bedtime 90 tablet 3   No current facility-administered medications for this visit.   Objective  Objective:  BP  134/70 (BP Location: Left Arm, Patient Position: Sitting, Cuff Size: Normal)   Pulse 67   Temp 98.2 F (36.8 C) (Temporal)   Ht 5' 10 (1.778 m)   Wt 191 lb 6 oz (86.8 kg)   SpO2 93%   BMI 27.46 kg/m  Gen: NAD, resting comfortably CV: RRR no murmurs rubs or gallops Lungs: CTAB no crackles, wheeze, rhonchi Ext: no edema Skin: warm, dry Neuro: progressively withdrawn each visit- minimal interaction and doesn't participate with exam   Assessment and Plan:   #TCM/hospital follow up   Assessment & Plan Hypernatremia Due to dehydration and corrected in the hospital-noted to recheck this level today. Some weakness from hospitlizaiton- continue to work with physical therapy/occupational therapy- we will help coordinate orders working with [facility]- his behavioral disturbance may cause some challenges History of dehydration Encouraged regular hydration to help avoid this-may need supportive family on this Elevated AST (SGOT) Update levels today- holding statin for now though I do not think that's the primary issue Elevated ALT measurement Update levels today- holding statin for now though I do not think that's the primary issue AKI (acute kidney injury) Improved in hospital- update levels today Severe dementia without behavioral disturbance, psychotic disturbance, mood disturbance, or anxiety, unspecified dementia type (HCC) A large challenge for him- he can get agitated if encouraged to eat or drink which is a barrier- increases risk of recurrence -doing ok on trazodone  50 mg- falls asleep per caretaker. Theyd like to consider going down to 25 mg and I think maybe doing that outside of holidays/winter time- higher risk of sundowning this time of year likely. Will change prescription to reflect the 50 mg dose for now  Essential hypertension Reasonable control on amlodipine  and olmesartan  5-40 mg daily Hyperlipidemia, unspecified hyperlipidemia type Holding statin - reasonable control on  statin - but with dementia history I'm ok with being cautious especially without known CAD or cerebrovascular accident issues Stage 3a chronic kidney disease (HCC) Update levels- thankfully improved after AKI Low phosphate levels Check today- improved on last check  Recommended follow up: Return in about 3 months (around 03/17/2025) for followup or sooner if needed.Schedule b4 you leave. Future Appointments  Date Time Provider Department Center  12/24/2024  2:45 PM Aura Mliss LABOR, RN [facility] None  01/03/2025 11:00 AM Katrinka Garnette KIDD, MD [facility]  01/13/2025 11:30 AM Shannon Agent, MD [facility] None    Lab/Order associations:   ICD-10-CM   1. Hypernatremia  E87.0 Comprehensive metabolic panel with GFR    CBC with Differential/Platelet    2. History of dehydration  Z86.39     3. Elevated AST (SGOT)  R74.01     4. Elevated ALT measurement  R74.01     5. AKI (acute kidney injury)  N17.9     6. Severe dementia without behavioral disturbance, psychotic disturbance, mood disturbance, or anxiety, unspecified dementia type (HCC)  F03.C0     7. Essential hypertension  I10 Comprehensive metabolic panel with GFR    CBC with Differential/Platelet  8. Hyperlipidemia, unspecified hyperlipidemia type  E78.5     9. Stage 3a chronic kidney disease (HCC)  N18.31     10. Low phosphate levels  E83.39 Phosphorus      Meds ordered this encounter  Medications   traZODone  (DESYREL ) 50 MG tablet    Sig: Take 1 tablet (50 mg total) by mouth at bedtime. Taking at bedtime    Dispense:  90 tablet    Refill:  3   I personally spent a total of 41 minutes in the care of the patient today including preparing to see the patient, getting/reviewing separately obtained history, performing a medically appropriate exam/evaluation, counseling and educating on dehydration/ehalthy meals etc/ placing orders, and documenting clinical information in the EHR.  Return precautions advised.  Garnette Lukes, MD   "

## 2024-12-17 NOTE — Assessment & Plan Note (Signed)
 A large challenge for him- he can get agitated if encouraged to eat or drink which is a barrier- increases risk of recurrence -doing ok on trazodone  50 mg- falls asleep per caretaker. Theyd like to consider going down to 25 mg and I think maybe doing that outside of holidays/winter time- higher risk of sundowning this time of year likely. Will change prescription to reflect the 50 mg dose for now

## 2024-12-17 NOTE — Assessment & Plan Note (Signed)
 Holding statin - reasonable control on statin - but with dementia history I'm ok with being cautious especially without known CAD or cerebrovascular accident issues

## 2024-12-17 NOTE — Assessment & Plan Note (Signed)
 Update levels- thankfully improved after AKI

## 2024-12-17 NOTE — Patient Instructions (Addendum)
 Dehydration led to high sodium- we need to continue to work to make him well hydrated.   Lets make sure to get 3 meals a day -See if we can get more fluids in general -perhaps with ensure in between meals to increase as some of nutrition markers have been lower - a healthy diet rich in protein such as baked or broiled chicken and fish, fiber including fruits and vegetables. In general mediterranean diet is considered heart healthy and rich in healthy foods -we understand with his dementia how challenging this can be so all we can do is our best  Please stop by lab before you go If you have mychart- we will send your results within 3 business days of us  receiving them.  If you do not have mychart- we will call you about results within 5 business days of us  receiving them.  *please also note that you will see labs on mychart as soon as they post. I will later go in and write notes on them- will say notes from Dr. Katrinka   Recommended follow up: Return in about 3 months (around 03/17/2025) for followup or sooner if needed.Schedule b4 you leave. As long as labs look good today can move January visit out to march

## 2024-12-18 ENCOUNTER — Ambulatory Visit: Payer: Self-pay | Admitting: Family Medicine

## 2024-12-18 DIAGNOSIS — E870 Hyperosmolality and hypernatremia: Secondary | ICD-10-CM

## 2024-12-18 DIAGNOSIS — R748 Abnormal levels of other serum enzymes: Secondary | ICD-10-CM

## 2024-12-18 DIAGNOSIS — R7401 Elevation of levels of liver transaminase levels: Secondary | ICD-10-CM

## 2024-12-23 ENCOUNTER — Telehealth: Payer: Self-pay | Admitting: Family Medicine

## 2024-12-23 NOTE — Telephone Encounter (Signed)
 Left vm for pts daughter regarding scheduling labs that were ordered today and that once US  is approved they will call him to get scheduled.    Copied from CRM #[id number]. Topic: Clinical - Lab/Test Results >> Dec 23, 2024 11:38 AM Alfonso ORN wrote: Reason for CRM: pt daughter was told that he would need f/u labs done and also never received a call from place for pt to get ultrasound for his liver functioning. Advised OV 1/9 .  Please call daughter with information regarding ultrasound as unclear on referral and clarify if labs are needed prior to 1/9 OV  Callback [phone]

## 2024-12-24 ENCOUNTER — Telehealth: Payer: Self-pay

## 2024-12-24 ENCOUNTER — Other Ambulatory Visit: Payer: Self-pay | Admitting: *Deleted

## 2024-12-24 NOTE — Telephone Encounter (Signed)
 Copied from CRM [id number]. Topic: Clinical - Medical Advice >> Dec 24, 2024  2:28 PM Thersia C wrote: Reason for CRM: busPIRone  (BUSPAR ) 5 MG tablet , Want to know if the dosage can be increased. States patient balls his fist up . Also wants to revisit about in home care regarding this , feels like if increased he can be calm down some because he is in fight mode all the time   [phone]

## 2024-12-25 ENCOUNTER — Encounter: Payer: Self-pay | Admitting: *Deleted

## 2024-12-25 ENCOUNTER — Telehealth: Payer: Self-pay | Admitting: Family Medicine

## 2024-12-25 NOTE — Telephone Encounter (Signed)
 Meade called on behalf of her dad she had some information towards ways she would like to test/try the Buspar  medication for her dad since it seems to work better they're requesting either a med increase or no pm Buspar  and only taking them up to midday. Any additional information reach out to the patient regarding this matter to know what is best. Thank you!  Take 1 tablet (5 mg total) by mouth 2 (two) times daily., Starting Fri 08/30/2024, Normal  Dispense: 60 tablet  Refills: 5 ordered  Pharmacy: [facility] - [place], [place] - [address] (Ph: [phone])

## 2024-12-27 ENCOUNTER — Telehealth: Payer: Self-pay | Admitting: Family Medicine

## 2024-12-27 NOTE — Telephone Encounter (Signed)
 I am not 100% sure what is being asked here.  They are requesting a dose increase but also not to take afternoon?  Would like to try perhaps 7.5 mg once daily in the morning?  Is there a particular reason they do not want the afternoon dose?  From other note Meade called on behalf of her dad she had some information towards ways she would like to test/try the Buspar  medication for her dad since it seems to work better they're requesting either a med increase or no pm Buspar  and only taking them up to midday. Any additional information reach out to the patient regarding this matter to know what is best. Thank you!      Take 1 tablet (5 mg total) by mouth 2 (two) times daily., Starting Fri 08/30/2024, Normal  Dispense: 60 tablet  Refills: 5 ordered  Pharmacy: [facility] - [place], [place] - [address] (Ph: [phone])

## 2024-12-27 NOTE — Telephone Encounter (Signed)
 Meade had a conversation with her sister regarding Raja and they would like to increase his medication. He is becoming more combative and not letting her bathe him anymore and becoming more aggressive, they possibly would like something added in the morning and at night to help keep him calmer and able to take care of him easier.

## 2024-12-27 NOTE — Telephone Encounter (Signed)
 See other phone note from today.

## 2024-12-27 NOTE — Telephone Encounter (Signed)
 Is this okay?    Copied from CRM [id number]. Topic: Clinical - Medication Question >> Dec 27, 2024 11:38 AM Roselie BROCKS wrote: Reason for CRM: Patients daughter , Meade calling for update on request that she asked about changing patients busPIRone  (BUSPAR ) 5 MG tablet to AM instead of Pm, and requests a return call

## 2024-12-27 NOTE — Telephone Encounter (Signed)
 I would like to increase this but I want to make sure her liver function tests are at least stable on upcoming labs and the ultrasound looks okay as well-very small chance this could be related to liver function changes but I just want to be cautious.  If they would like me to move forward with a low dose adjustment could try buspirone  7.5 mg twice daily #60 with 5 refills-you can sinus and if they would like while waiting on the testing

## 2024-12-30 ENCOUNTER — Other Ambulatory Visit: Payer: Self-pay | Admitting: Family Medicine

## 2024-12-30 ENCOUNTER — Telehealth: Payer: Self-pay | Admitting: *Deleted

## 2024-12-30 MED ORDER — BUSPIRONE HCL 7.5 MG PO TABS: 7.5000 mg | ORAL_TABLET | Freq: Two times a day (BID) | ORAL | 5 refills | Status: DC

## 2024-12-30 NOTE — Telephone Encounter (Signed)
 CALLED PATIENT'S DAUGHTER- GLORIA Poole TO INFORM OF CT FOR 01-07-25- ARRIVAL TIME- 2:45 PM @ [facility], NO RESTRICTIONS TO SCAN, PATIENT TO RECEIVE RESULTS FROM DR. KINARD ON 01-13-25 @ 11:30 AM, SPOKE WITH PATIENT'S DAUGHTER- GLORIA Whedbee AND SHE IS AWARE OF THESE APPTS. AND THE INSTRUCTIONS

## 2024-12-30 NOTE — Telephone Encounter (Signed)
 Buspirone  sent for bid per daughter request has been helping the patient more when taken twice daily

## 2025-01-02 ENCOUNTER — Telehealth: Payer: Self-pay | Admitting: Family Medicine

## 2025-01-02 NOTE — Telephone Encounter (Signed)
 Copied from CRM [id number]. Topic: General - Transportation >> Jan 02, 2025 12:03 PM Burnard DEL wrote: Reason for CRM: Patients daughter Meade would like an update on transportation for her dad for his appointment tomorrow.

## 2025-01-02 NOTE — Telephone Encounter (Signed)
 LVM to clarify address. Unable to set up transportation until then. Please transfer call to [employer].

## 2025-01-03 ENCOUNTER — Ambulatory Visit: Payer: Medicare Other | Admitting: Family Medicine

## 2025-01-03 ENCOUNTER — Encounter: Payer: Self-pay | Admitting: Family Medicine

## 2025-01-03 VITALS — BP 112/70 | HR 76 | Temp 98.1°F | Ht 70.0 in | Wt 187.4 lb

## 2025-01-03 DIAGNOSIS — R748 Abnormal levels of other serum enzymes: Secondary | ICD-10-CM

## 2025-01-03 DIAGNOSIS — Z0000 Encounter for general adult medical examination without abnormal findings: Secondary | ICD-10-CM

## 2025-01-03 DIAGNOSIS — N1831 Chronic kidney disease, stage 3a: Secondary | ICD-10-CM

## 2025-01-03 DIAGNOSIS — J439 Emphysema, unspecified: Secondary | ICD-10-CM

## 2025-01-03 DIAGNOSIS — E870 Hyperosmolality and hypernatremia: Secondary | ICD-10-CM

## 2025-01-03 DIAGNOSIS — F03C11 Unspecified dementia, severe, with agitation: Secondary | ICD-10-CM | POA: Diagnosis not present

## 2025-01-03 DIAGNOSIS — R7401 Elevation of levels of liver transaminase levels: Secondary | ICD-10-CM

## 2025-01-03 DIAGNOSIS — Z8546 Personal history of malignant neoplasm of prostate: Secondary | ICD-10-CM | POA: Diagnosis not present

## 2025-01-03 MED ORDER — BUSPIRONE HCL 5 MG PO TABS: 5.0000 mg | ORAL_TABLET | Freq: Two times a day (BID) | ORAL | 5 refills | Status: DC

## 2025-01-03 MED ORDER — TRAZODONE HCL 50 MG PO TABS: 25.0000 mg | ORAL_TABLET | Freq: Every evening | ORAL | 3 refills | Status: AC | PRN

## 2025-01-03 NOTE — Patient Instructions (Addendum)
 Please stop by lab before you go- PSA from today but also all labs from 12/23/24 If you have mychart- we will send your results within 3 business days of us  receiving them.  If you do not have mychart- we will call you about results within 5 business days of us  receiving them.  *please also note that you will see labs on mychart as soon as they post. I will later go in and write notes on them- will say notes from Dr. Katrinka   Trial buspirone  just 5 mg twice daily, trial trazodone  25 mg dose at night- hoping he does ok with this and I know you want to trial off trazodone - ok to do that in about a month if does well with half dose   Recommended follow up: Return in about 6 weeks (around 02/14/2025) for followup or sooner if needed.Schedule b4 you leave.

## 2025-01-03 NOTE — Progress Notes (Signed)
 " Phone: [phone]   Subjective:  Patient presents today for their annual physical. Chief complaint-noted.   See problem oriented charting- ROS- full  review of systems was completed and negative  except for topics noted under acute/chronic concerns  The following were reviewed and entered/updated in epic: Past Medical History:  Diagnosis Date   Cancer of prostate (HCC) 2001   Chronic kidney disease (CKD), stage III (moderate) (HCC)    thelbert 02/08/2017   Dementia (HCC)    DIVERTICULOSIS, COLON 07/25/2007   Qualifier: Diagnosis of  By: Trudy, LPN, Kendell HERO    High cholesterol    History of radiation therapy 05/01/17-05/09/17   right lung 54 Gy in 3 fractions   History of radiation therapy    prostate radioactive seed placement 07/2000  Dr Jason   Hypertension    Lung cancer (HCC) 03/2017   Patient Active Problem List   Diagnosis Date Noted   History of lung cancer 09/18/2017    Priority: High   Severe dementia (HCC) 07/17/2015    Priority: High   CKD (chronic kidney disease), stage III (HCC) 02/08/2017    Priority: Medium    Hyperlipidemia 10/15/2014    Priority: Medium    Gout of big toe 08/04/2008    Priority: Medium    Essential hypertension 07/25/2007    Priority: Medium    PROSTATE CANCER, HX OF 07/25/2007    Priority: Medium    Aortic atherosclerosis 01/06/2018    Priority: Low   Pulmonary emphysema (HCC)     Priority: Low   Parotiditis 02/08/2017    Priority: Low   Allergic rhinitis 10/15/2014    Priority: Low   Overactive bladder 10/15/2014    Priority: Low   CARDIAC MURMUR 01/10/2011    Priority: Low   ORGANIC IMPOTENCE 08/12/2010    Priority: Low   Past Surgical History:  Procedure Laterality Date   DIRECT LARYNGOSCOPY N/A 06/21/2017   Procedure: DIRECT LARYNGOSCOPY;  Surgeon: Roark Rush, MD;  Location: [facility] OR;  Service: ENT;  Laterality: N/A;   EYE SURGERY     INSERTION PROSTATE RADIATION SEED  07/2000   thelbert 05/09/2011   PENILE PROSTHESIS  IMPLANT  02/2002   Insertion of Mentor three piece inflatable penile prosthesis./notes 05/09/2011   PENILE PROSTHESIS PLACEMENT  06/2003   Placement of Mentor rod prosthesis/notes 05/09/2011   REFRACTIVE SURGERY Bilateral    REMOVAL OF PENILE PROSTHESIS  06/2003   Removal of three-piece Mentor prosthesis/notes 05/09/2011   SCROTAL SURGERY  06/2003   Repair of scrotal defect/notes 05/09/2011    Family History  Problem Relation Age of Onset   Early death Father        cirrhosis   Alcoholism Father    Cirrhosis Other    Breast cancer Sister     Medications- reviewed and updated Current Outpatient Medications  Medication Sig Dispense Refill   amLODipine -olmesartan  (AZOR ) 5-40 MG tablet Take 1 tablet by mouth daily. 90 tablet 3   busPIRone  (BUSPAR ) 5 MG tablet Take 1 tablet (5 mg total) by mouth 2 (two) times daily. 60 tablet 5   donepezil  (ARICEPT ) 10 MG tablet TAKE ONE TABLET BY MOUTH EVERY DAY (Patient taking differently: Take 10 mg by mouth at bedtime. Taking at bedtime) 90 tablet PRN   Multiple Vitamins-Minerals (MULTIVITAMIN WITH MINERALS) tablet Take 1 tablet by mouth daily. 120 tablet 0   traZODone  (DESYREL ) 50 MG tablet Take 0.5 tablets (25 mg total) by mouth at bedtime as needed for sleep. 45 tablet 3  ferrous sulfate  325 (65 FE) MG tablet Take three times a week (Patient not taking: Reported on 01/03/2025) 40 tablet 3   [Paused] rosuvastatin  (CRESTOR ) 20 MG tablet TAKE ONE TABLET BY MOUTH EVERY DAY (Patient not taking: Reported on 01/03/2025) 90 tablet PRN   No current facility-administered medications for this visit.    Allergies-reviewed and updated Allergies[1]  Social History   Social History Narrative   ** Merged History Encounter **       Separated since 2010. 4 kids. 4 grandkids. 2 greatgrandkids.   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 [employer]. Dementia unfortunately stopped these travels.    Objective  Objective:  BP  112/70 (BP Location: Left Arm, Patient Position: Sitting, Cuff Size: Normal)   Pulse 76   Temp 98.1 F (36.7 C) (Temporal)   Ht 5' 10 (1.778 m)   Wt 187 lb 6.4 oz (85 kg)   SpO2 96%   BMI 26.89 kg/m  Gen: NAD, resting comfortably HEENT: Mucous membranes are moist. Oropharynx normal Neck: no thyromegaly CV: RRR no murmurs rubs or gallops Lungs: CTAB no crackles, wheeze, rhonchi Abdomen: soft/nontender/nondistended/normal bowel sounds. No rebound or guarding.  Ext: no edema Skin: warm, dry Neuro: moves all extremities, PERRLA, falls asleep during visit    Assessment and Plan  89 y.o. male presenting for annual physical.  Health Maintenance counseling: 1. Anticipatory guidance: Patient counseled regarding regular dental exams - q6 months in general, eye exams - in general yearly- plans on new one,  avoiding smoking and second hand smoke , limiting alcohol to 2 beverages per day - none, no illicit drugs .   2. Risk factor reduction:  Advised patient of need for regular exercise and diet rich and fruits and vegetables to reduce risk of heart attack and stroke.  Exercise- not exercising- would encourage as able.  Diet/weight management-pretty stable- down some since being home- trying to adjust nutrition with low albumin but could be other issues.  Wt Readings from Last 3 Encounters:  01/03/25 187 lb 6.4 oz (85 kg)  12/17/24 191 lb 6 oz (86.8 kg)  12/04/24 189 lb 6 oz (85.9 kg)  3. Immunizations/screenings/ancillary studies- bad reaction to flu shot last year and wants to hold off. Also wants to hold off on shingrix.  Immunization History  Administered Date(s) Administered   Fluad Quad(high Dose 65+) 10/12/2020, 09/09/2021, 09/20/2022   INFLUENZA, HIGH DOSE SEASONAL PF 08/22/2016, 11/07/2018   Influenza Whole 11/06/2007, 10/07/2008, 12/09/2010   Influenza,inj,Quad PF,6+ Mos 10/15/2014   Influenza-Unspecified 10/03/2023   PFIZER(Purple Top)SARS-COV-2 Vaccination 05/07/2020,  05/27/2020   Pneumococcal Conjugate-13 05/08/2015   Pneumococcal Polysaccharide-23 11/06/2007   Td 12/26/2006, 05/03/2018  4. Prostate cancer screening- see below  Lab Results  Component Value Date   PSA 0.35 08/13/2024   PSA 0.20 03/15/2022   PSA 0.20 09/09/2021   5. Colon cancer screening - past age based screening recommendations  6. Skin cancer screening- lower risk due to melanin content. advised regular sunscreen use. Denies worrisome, changing, or new skin lesions.  7. Smoking associated screening (lung cancer screening, AAA screen 65-75, UA)- former smoker- follows up with DrRONITA Shannon and has appointment January 13th 8. STD screening - not dating  Status of chronic or acute concerns   #social update- care home with a private care provider- she has cared for multiple with dementia in past.  -daughter is coming to help him bathe him but he won't allow him below the waist. He's  very resistant  #Dementia- saw neurology years ago # Agitation/insomnia S: Medication: Donepezil  10 mg, buspirone  5 mg twice daily for agitation as well as trazodone  100 mg for sleep-tends to wander without this when at [place] but has been ok at home on just 50 mg  -Belsomra  and doxepin  too costly as well as ramelteon  .  -Did not tolerate Namenda  due to dizziness A/P: dementia with behavioral disturbance . Doing ok with rest on trazodone  50 mg and family wants to try 25 mg- daughter glecia- I'm ok trying this to reduc emedicine burden. She would like to not increase buspirone  to 7.5 mg twice daily as recently sent in so will represcribe 5 mg twice daily.  -she's also hoping with less trazodone  less risk to liver (limited but possible risk) - discussed rexulti option but shed prefer to reduce medicines (with his agitation he sometimes points at things and wonders if he's having hallucinations as well)   #elevated LFT- see lab discussion/ordered 12/23/24 - these will be released today  #History of lung  cancer-annual imaging with Dr. Loreatha recently 01/12/24  And was stable with upcoming visit  #History of prostate cancer-follows with [facility] urology yearly in the past- now we check PSA   #hypertension S: medication: Amlodipine -olmesartan  5-40 mg BP Readings from Last 3 Encounters:  01/03/25 112/70  12/17/24 134/70  12/07/24 127/67  A/P: well controlled continue current medications   #hyperlipidemia #Aortic atherosclerosis S: Medication: Rosuvastatin  20 mg Lab Results  Component Value Date   CHOL 130 08/13/2024   HDL 37.80 (L) 08/13/2024   LDLCALC 66 08/13/2024   LDLDIRECT 58.0 09/09/2021   TRIG 132.0 08/13/2024   CHOLHDL 3 08/13/2024   A/P: well controlled too soon for repeat- controlled on statin but we are holding that for now with LFT elevation. With dementia even with aortic atherosclerosis may remain off rosuvastaitn  #Emphysema-incidental finding on imaging S: Medication: None A/P: asymptomatic continue to monitor    #CKD III- GFR of 39 on most recent check- hoepfully stable- update today  Recommended follow up: Return in about 6 weeks (around 02/14/2025) for followup or sooner if needed.Schedule b4 you leave.  Future Appointments  Date Time Provider Department Center  01/07/2025  3:00 PM [facility] 1 [facility] [place]  01/13/2025 11:30 AM Shannon Agent, MD [facility] None   Lab/Order associations:not fasting that I'm aware of   ICD-10-CM   1. Preventative health care  Z00.00     2. PROSTATE CANCER, HX OF  Z85.46 PSA    3. Pulmonary emphysema (HCC)  J43.9     4. Stage 3a chronic kidney disease (HCC)  N18.31     5. Severe dementia with agitation, unspecified dementia type (HCC)  F03.C11       Meds ordered this encounter  Medications   busPIRone  (BUSPAR ) 5 MG tablet    Sig: Take 1 tablet (5 mg total) by mouth 2 (two) times daily.    Dispense:  60 tablet    Refill:  5   traZODone  (DESYREL ) 50 MG tablet    Sig: Take 0.5 tablets (25 mg total) by mouth at  bedtime as needed for sleep.    Dispense:  45 tablet    Refill:  3    Return precautions advised.  Garnette Lukes, MD      [1]  Allergies Allergen Reactions   Namenda  [Memantine ] Other (See Comments)    Made the patient feel dizzy   "

## 2025-01-03 NOTE — Addendum Note (Signed)
 Addended by: Bena Kobel on: 01/03/2025 04:28 PM   Modules accepted: Orders

## 2025-01-06 ENCOUNTER — Telehealth: Payer: Self-pay | Admitting: Radiation Oncology

## 2025-01-06 NOTE — Telephone Encounter (Signed)
 1/12 Received call from patient's daughter to cancel CT scans and FU appt, no r/s at this time due to transportation issues.  Email sent to [facility] H/Dr. Shannon and nursing.

## 2025-01-07 ENCOUNTER — Ambulatory Visit (HOSPITAL_COMMUNITY)

## 2025-01-09 ENCOUNTER — Telehealth: Payer: Self-pay | Admitting: Family Medicine

## 2025-01-09 NOTE — Telephone Encounter (Signed)
 [facility] faxed Home Health Certificate (Order [place] [id number]), to be filled out by provider. [facility] requested to send it back via Fax within ASAP. Document is located in providers tray at front office.Please advise at [phone].

## 2025-01-10 ENCOUNTER — Other Ambulatory Visit: Payer: Self-pay

## 2025-01-10 ENCOUNTER — Telehealth: Payer: Self-pay | Admitting: Family Medicine

## 2025-01-10 ENCOUNTER — Telehealth: Payer: Self-pay

## 2025-01-10 ENCOUNTER — Other Ambulatory Visit

## 2025-01-10 DIAGNOSIS — R945 Abnormal results of liver function studies: Secondary | ICD-10-CM

## 2025-01-10 DIAGNOSIS — R7989 Other specified abnormal findings of blood chemistry: Secondary | ICD-10-CM

## 2025-01-10 DIAGNOSIS — E7800 Pure hypercholesterolemia, unspecified: Secondary | ICD-10-CM | POA: Diagnosis not present

## 2025-01-10 DIAGNOSIS — E870 Hyperosmolality and hypernatremia: Secondary | ICD-10-CM

## 2025-01-10 DIAGNOSIS — N1831 Chronic kidney disease, stage 3a: Secondary | ICD-10-CM | POA: Diagnosis not present

## 2025-01-10 DIAGNOSIS — Z8546 Personal history of malignant neoplasm of prostate: Secondary | ICD-10-CM

## 2025-01-10 DIAGNOSIS — R748 Abnormal levels of other serum enzymes: Secondary | ICD-10-CM

## 2025-01-10 DIAGNOSIS — N179 Acute kidney failure, unspecified: Secondary | ICD-10-CM | POA: Diagnosis not present

## 2025-01-10 DIAGNOSIS — R32 Unspecified urinary incontinence: Secondary | ICD-10-CM | POA: Diagnosis not present

## 2025-01-10 DIAGNOSIS — D649 Anemia, unspecified: Secondary | ICD-10-CM | POA: Diagnosis not present

## 2025-01-10 DIAGNOSIS — E785 Hyperlipidemia, unspecified: Secondary | ICD-10-CM

## 2025-01-10 DIAGNOSIS — Z0000 Encounter for general adult medical examination without abnormal findings: Secondary | ICD-10-CM

## 2025-01-10 DIAGNOSIS — E878 Other disorders of electrolyte and fluid balance, not elsewhere classified: Secondary | ICD-10-CM | POA: Diagnosis not present

## 2025-01-10 DIAGNOSIS — F03C18 Unspecified dementia, severe, with other behavioral disturbance: Secondary | ICD-10-CM | POA: Diagnosis not present

## 2025-01-10 DIAGNOSIS — I129 Hypertensive chronic kidney disease with stage 1 through stage 4 chronic kidney disease, or unspecified chronic kidney disease: Secondary | ICD-10-CM | POA: Diagnosis not present

## 2025-01-10 DIAGNOSIS — E876 Hypokalemia: Secondary | ICD-10-CM | POA: Diagnosis not present

## 2025-01-10 DIAGNOSIS — R7401 Elevation of levels of liver transaminase levels: Secondary | ICD-10-CM

## 2025-01-10 DIAGNOSIS — E860 Dehydration: Secondary | ICD-10-CM | POA: Diagnosis not present

## 2025-01-10 DIAGNOSIS — E8809 Other disorders of plasma-protein metabolism, not elsewhere classified: Secondary | ICD-10-CM | POA: Diagnosis not present

## 2025-01-10 NOTE — Telephone Encounter (Signed)
Placed in provider box

## 2025-01-10 NOTE — Telephone Encounter (Signed)
 Copied from CRM [id number]. Topic: General - Transportation >> Jan 09, 2025  4:54 PM Wess S wrote: Reason for CRM: Patient's daughter, Meade, would like to drop off paperwork for transportation so that she will be able to ride with her dad to appointments. She stated they will be coming tomorrow for labs.  Callback #: [phone]  Spoke to patient's daughter. She will be dropping off the paperwork and providing a fax number for us  to send back our part.

## 2025-01-10 NOTE — Telephone Encounter (Signed)
Back on your desk.

## 2025-01-10 NOTE — Telephone Encounter (Signed)
 Type of form received: [employer] [place] (transportation paperwork)  Additional comments: Patient's emergency contact will be calling in on Monday (01/13/25) to provide a fax#  Received by: Karleen Margarita  Form should be Faxed to:  Form should be mailed to: N/A    Is patient requesting call for pickup: Yes   Form placed:  Provider box  Attach charge sheet. Yes  Individual made aware of 3-5 business day turn around (Y/N)? Yes

## 2025-01-11 ENCOUNTER — Emergency Department (HOSPITAL_COMMUNITY)
Admission: EM | Admit: 2025-01-11 | Discharge: 2025-01-11 | Disposition: A | Discharge status: Home / Self Care | Attending: Emergency Medicine | Admitting: Emergency Medicine

## 2025-01-11 DIAGNOSIS — R7989 Other specified abnormal findings of blood chemistry: Secondary | ICD-10-CM | POA: Insufficient documentation

## 2025-01-11 DIAGNOSIS — Z0189 Encounter for other specified special examinations: Secondary | ICD-10-CM | POA: Insufficient documentation

## 2025-01-11 DIAGNOSIS — F0390 Unspecified dementia without behavioral disturbance: Secondary | ICD-10-CM | POA: Diagnosis not present

## 2025-01-11 LAB — CBC WITH DIFFERENTIAL/PLATELET
Abs Immature Granulocytes: 0.08 K/uL — ABNORMAL HIGH (ref 0.00–0.07)
Basophils Absolute: 0.1 K/uL (ref 0.0–0.1)
Basophils Relative: 1 %
Eosinophils Absolute: 0.3 K/uL (ref 0.0–0.5)
Eosinophils Relative: 4 %
HCT: 43.7 % (ref 39.0–52.0)
Hemoglobin: 13.9 g/dL (ref 13.0–17.0)
Immature Granulocytes: 1 %
Lymphocytes Relative: 37 %
Lymphs Abs: 2.4 K/uL (ref 0.7–4.0)
MCH: 30.2 pg (ref 26.0–34.0)
MCHC: 31.8 g/dL (ref 30.0–36.0)
MCV: 94.8 fL (ref 80.0–100.0)
Monocytes Absolute: 0.3 K/uL (ref 0.1–1.0)
Monocytes Relative: 5 %
Neutro Abs: 3.4 K/uL (ref 1.7–7.7)
Neutrophils Relative %: 52 %
Platelets: 180 K/uL (ref 150–400)
RBC: 4.61 MIL/uL (ref 4.22–5.81)
RDW: 13.8 % (ref 11.5–15.5)
WBC: 6.5 K/uL (ref 4.0–10.5)
nRBC: 0 % (ref 0.0–0.2)

## 2025-01-11 LAB — HEPATITIS B SURFACE ANTIBODY,QUALITATIVE: Hep B S Ab: NONREACTIVE

## 2025-01-11 LAB — COMPREHENSIVE METABOLIC PANEL WITH GFR
ALT: 30 U/L (ref 0–44)
AST: 34 U/L (ref 15–41)
Albumin: 3.5 g/dL (ref 3.5–5.0)
Alkaline Phosphatase: 78 U/L (ref 38–126)
Anion gap: 8 (ref 5–15)
BUN: 10 mg/dL (ref 8–23)
CO2: 23 mmol/L (ref 22–32)
Calcium: 8.9 mg/dL (ref 8.9–10.3)
Chloride: 114 mmol/L — ABNORMAL HIGH (ref 98–111)
Creatinine, Ser: 1.42 mg/dL — ABNORMAL HIGH (ref 0.61–1.24)
GFR, Estimated: 48 mL/min — ABNORMAL LOW
Glucose, Bld: 91 mg/dL (ref 70–99)
Potassium: 3.7 mmol/L (ref 3.5–5.1)
Sodium: 145 mmol/L (ref 135–145)
Total Bilirubin: 0.4 mg/dL (ref 0.0–1.2)
Total Protein: 6.1 g/dL — ABNORMAL LOW (ref 6.5–8.1)

## 2025-01-11 LAB — URINALYSIS, ROUTINE W REFLEX MICROSCOPIC
Bilirubin Urine: NEGATIVE
Glucose, UA: NEGATIVE mg/dL
Hgb urine dipstick: NEGATIVE
Ketones, ur: NEGATIVE mg/dL
Leukocytes,Ua: NEGATIVE
Nitrite: NEGATIVE
Protein, ur: NEGATIVE mg/dL
Specific Gravity, Urine: 1.02 (ref 1.005–1.030)
pH: 5 (ref 5.0–8.0)

## 2025-01-11 LAB — IRON AND TIBC
Iron: 77 ug/dL (ref 45–182)
Saturation Ratios: 28 % (ref 17.9–39.5)
TIBC: 277 ug/dL (ref 250–450)
UIBC: 200 ug/dL

## 2025-01-11 LAB — HEPATITIS A ANTIBODY, TOTAL: hep A Total Ab: NONREACTIVE

## 2025-01-11 LAB — PSA: Prostatic Specific Antigen: 0.48 ng/mL (ref 0.00–4.00)

## 2025-01-11 LAB — FERRITIN: Ferritin: 42 ng/mL (ref 24–336)

## 2025-01-11 LAB — HEPATITIS A ANTIBODY, IGM: Hep A IgM: NONREACTIVE

## 2025-01-11 LAB — HEPATITIS C ANTIBODY: HCV Ab: NONREACTIVE

## 2025-01-11 LAB — HEPATITIS B SURFACE ANTIGEN: Hepatitis B Surface Ag: NONREACTIVE

## 2025-01-11 MED ORDER — LACTATED RINGERS IV BOLUS
1000.0000 mL | Freq: Once | INTRAVENOUS | Status: AC
  Administered 2025-01-11: 1000 mL via INTRAVENOUS

## 2025-01-11 MED ORDER — LORAZEPAM 2 MG/ML IJ SOLN
0.5000 mg | Freq: Once | INTRAMUSCULAR | Status: DC
  Filled 2025-01-11: qty 1

## 2025-01-11 NOTE — ED Triage Notes (Addendum)
 Pt referred via PCP for lab work, pt has been without lab work x 2 weeks and has  spikes on his liver that they want to check. Pt demented, and jumped during lab work and next attempt for blood was unsuccessful , daughter was told he was dehydrated.

## 2025-01-11 NOTE — Discharge Instructions (Signed)
 Recommend follow-up with primary care for discussion of labs obtained today.  Encourage increased liquid intake to prevent dehydration.

## 2025-01-11 NOTE — ED Notes (Signed)
 Pt agitated at this time, making physical and verbal threats. Provider aware. Pt in bed at this time with family at bedside, bed is in lowest position with side rails up. Call light is within reach

## 2025-01-11 NOTE — ED Provider Notes (Signed)
 " [place] EMERGENCY DEPARTMENT AT [facility] Provider Note   CSN: [id number] Arrival date & time: 01/11/25  1301     Patient presents with: Abnormal Lab   Jerry [name] is a 89 y.o. male presenting to the ED today with primary concern of need for labs to be drawn.  Has been to PCP twice, they are concerned that he is dehydrated and needs IV fluids before lab draws.  Mental status is baseline decreased secondary to history of dementia however caregiver that is present with him states that he is at his baseline presently and does not have any complaints at present.  Per the patient's caregiver, they were sent by PCP for 2 bags of IV fluid and lab draw.    Abnormal Lab      Prior to Admission medications  Medication Sig Start Date End Date Taking? Authorizing Provider  amLODipine -olmesartan  (AZOR ) 5-40 MG tablet Take 1 tablet by mouth daily. 08/30/24   Katrinka Garnette KIDD, MD  busPIRone  (BUSPAR ) 5 MG tablet Take 1 tablet (5 mg total) by mouth 2 (two) times daily. 01/03/25   Katrinka Garnette KIDD, MD  donepezil  (ARICEPT ) 10 MG tablet TAKE ONE TABLET BY MOUTH EVERY DAY Patient taking differently: Take 10 mg by mouth at bedtime. Taking at bedtime 10/28/24   Katrinka Garnette KIDD, MD  ferrous sulfate  325 (65 FE) MG tablet Take three times a week Patient not taking: Reported on 01/03/2025 08/30/24   Katrinka Garnette KIDD, MD  Multiple Vitamins-Minerals (MULTIVITAMIN WITH MINERALS) tablet Take 1 tablet by mouth daily. 12/07/24 12/07/25  Sheikh, Omair Latif, DO  [Paused] rosuvastatin  (CRESTOR ) 20 MG tablet TAKE ONE TABLET BY MOUTH EVERY DAY Patient not taking: Reported on 01/03/2025 Wait to take this until your doctor or other care provider tells you to start again. 10/28/24   Katrinka Garnette KIDD, MD  traZODone  (DESYREL ) 50 MG tablet Take 0.5 tablets (25 mg total) by mouth at bedtime as needed for sleep. 01/03/25   Katrinka Garnette KIDD, MD    Allergies: Jerry [name] ]    Review of Systems   Constitutional:  Negative for chills and fever.  HENT:  Negative for sore throat.   Eyes:  Negative for visual disturbance.  Respiratory:  Negative for cough and shortness of breath.   Cardiovascular:  Negative for chest pain.  Gastrointestinal:  Negative for abdominal pain, diarrhea, nausea and vomiting.  Genitourinary:  Negative for dysuria and frequency.  Musculoskeletal:  Negative for back pain and neck pain.  Skin:  Negative for rash.  Neurological:  Negative for weakness, numbness and headaches.  Hematological:  Negative for adenopathy.  Psychiatric/Behavioral:  Negative for behavioral problems.   All other systems reviewed and are negative.   Updated Vital Signs BP 128/74 (BP Location: Right Arm)   Pulse 62   Temp 98 F (36.7 C) (Oral)   Resp 18   SpO2 100%   Physical Exam Vitals and nursing note reviewed.  Constitutional:      General: He is not in acute distress.    Appearance: Normal appearance. He is normal weight.  HENT:     Head: Normocephalic and atraumatic.     Mouth/Throat:     Mouth: Mucous membranes are moist.     Pharynx: Oropharynx is clear.     Comments: Oral mucosa are moist and normal-appearing. Eyes:     Extraocular Movements: Extraocular movements intact.     Conjunctiva/sclera: Conjunctivae normal.     Pupils: Pupils are equal, round, and  reactive to light.  Cardiovascular:     Rate and Rhythm: Normal rate and regular rhythm.     Pulses: Normal pulses.     Heart sounds: Normal heart sounds. No murmur heard.    No friction rub. No gallop.  Pulmonary:     Effort: Pulmonary effort is normal.     Breath sounds: Normal breath sounds.  Abdominal:     General: Abdomen is flat. Bowel sounds are normal.     Palpations: Abdomen is soft.  Musculoskeletal:        General: Normal range of motion.     Cervical back: Normal range of motion and neck supple.     Right lower leg: No edema.     Left lower leg: No edema.  Skin:    General: Skin is warm  and dry.     Capillary Refill: Capillary refill takes less than 2 seconds.     Comments: Skin turgor delayed however appears normal for age.  Neurological:     General: No focal deficit present.     Mental Status: He is alert. Mental status is at baseline.     GCS: GCS eye subscore is 4. GCS verbal subscore is 5. GCS motor subscore is 6.  Psychiatric:        Mood and Affect: Mood normal.     (all labs ordered are listed, but only abnormal results are displayed) Labs Reviewed  COMPREHENSIVE METABOLIC PANEL WITH GFR - Abnormal; Notable for the following components:      Result Value   Chloride 114 (*)    Creatinine, Ser 1.42 (*)    Total Protein 6.1 (*)    GFR, Estimated 48 (*)    All other components within normal limits  CBC WITH DIFFERENTIAL/PLATELET - Abnormal; Notable for the following components:   Abs Immature Granulocytes 0.08 (*)    All other components within normal limits  URINALYSIS, ROUTINE W REFLEX MICROSCOPIC  HEPATITIS B SURFACE ANTIBODY,QUALITATIVE  HEPATITIS B CORE ANTIBODY, TOTAL  HEPATITIS B SURFACE ANTIGEN  HEPATITIS A ANTIBODY, IGM  HEPATITIS A ANTIBODY, TOTAL  IRON AND TIBC  FERRITIN  HEPATITIS C ANTIBODY  PSA    EKG: None  Radiology: No results found.   Procedures   Medications Ordered in the ED  LORazepam  (ATIVAN ) injection 0.5 mg (has no administration in time range)  lactated ringers  bolus 1,000 mL (1,000 mLs Intravenous New Bag/Given 01/11/25 1515)    Clinical Course as of 01/11/25 1628  Sat Jan 11, 2025  1400 Personally evaluated patient, patient is wife reports that he was hospitalized in early December, and upon discharge at that time they were told he needed to follow-up with PCP for blood work in 1 week because his statin reportedly affected his liver function.  Reports that they were able to get blood work done initially, however he went back for repeat blood work 2 weeks ago as well as yesterday, and they were unable to obtain blood,  therefore she brought him to the emergency department due to concerns that the phlebotomist was unable to obtain blood which was also concerning to her for dehydration.  Patient denies complaint. [LS]    Clinical Course User Index [LS] Rogelia Jerilynn RAMAN, MD                                 Medical Decision Making Amount and/or Complexity of Data Reviewed Labs: ordered.  Risk Prescription  drug management.   Based on lack of current complaint, and lack of physical exam findings supportive of dehydration, will obtain IV access and draw labs, also obtain UA.  At time of discharge the CBC was obtained and shows normal findings, CMP does show elevated creatinine 1.42 with GFR decreased to 48, patient does have decreased renal function at baseline however this is decreased in comparison to baseline, therefore patient provided with IV fluid bolus.  No change in patient's status at time of discharge, careful return precautions discussed with patient as well as follow-up instructions with primary care for continued evaluation of his labs that were obtained today that have yet to be resulted.  Labs ordered were extrapolated from previous orders from PCP, including CMP, CBC, iron profile, UA, hep B surface antibody, hep B surface antigen, hep B core antibody, hep C antibody, hep a IgM, PSA.  Discussed that these will not be resulted until follow-up appointment with PCP within the next several days.     Final diagnoses:  Routine lab draw    ED Discharge Orders     None          Myriam Dorn BROCKS, [place] 01/11/25 1628  "

## 2025-01-12 LAB — HEPATITIS B CORE ANTIBODY, TOTAL: HEP B CORE AB: NEGATIVE

## 2025-01-13 ENCOUNTER — Ambulatory Visit: Payer: Self-pay | Admitting: Radiation Oncology

## 2025-01-13 NOTE — Telephone Encounter (Signed)
 Given to dr. Katrinka for review.

## 2025-01-15 DIAGNOSIS — Z0279 Encounter for issue of other medical certificate: Secondary | ICD-10-CM

## 2025-01-16 ENCOUNTER — Telehealth: Payer: Self-pay

## 2025-01-16 NOTE — Telephone Encounter (Signed)
 Patient scheduled for hospital follow up on 02/01/2024. ED will not result labs until follow up with PCP is done.

## 2025-01-16 NOTE — Telephone Encounter (Signed)
 Transition Care Management Follow-up Telephone Call Date of discharge and from where: 01/11/25 [facility] How have you been since you were released from the hospital? Better Any questions or concerns? No  Items Reviewed: Did the pt receive and understand the discharge instructions provided? Yes  Medications obtained and verified? Yes  Other? No  Any new allergies since your discharge? No  Dietary orders reviewed? No Do you have support at home? Yes   Home Care and Equipment/Supplies: Were home health services ordered? not applicable If so, what is the name of the agency?   Has the agency set up a time to come to the patient's home? not applicable Were any new equipment or medical supplies ordered?  No What is the name of the medical supply agency?  Were you able to get the supplies/equipment? not applicable Do you have any questions related to the use of the equipment or supplies? No  Functional Questionnaire: (I = Independent and D = Dependent) ADLs: I  Bathing/Dressing- I  Meal Prep- I  Eating- I  Maintaining continence- I  Transferring/Ambulation- I  Managing Meds- I  Follow up appointments reviewed:  PCP Hospital f/u appt confirmed? Yes  Scheduled to see Katrinka Senior on 02/20/25. Specialist Hospital f/u appt confirmed? No   Are transportation arrangements needed? No  If their condition worsens, is the pt aware to call PCP or go to the Emergency Dept.? Yes Was the patient provided with contact information for the PCP's office or ED? Yes Was to pt encouraged to call back with questions or concerns? Yes    Pt daughter advised per discharge summary from ED, labs will not be resulted until follow up with PCP; daughter advised PCP out of the office through 01/24/25. Hunter's CMA Basilio) scheduling pt with PCP 01/29/25 for follow up and lab review with patient and daughter.

## 2025-01-22 ENCOUNTER — Ambulatory Visit: Payer: Self-pay

## 2025-01-22 ENCOUNTER — Telehealth: Payer: Self-pay | Admitting: Family Medicine

## 2025-01-22 NOTE — Telephone Encounter (Signed)
 Spoke with meade and let her know it is safe for him to take the mucinex DM per Dr. Wendolyn instruction.

## 2025-01-22 NOTE — Telephone Encounter (Signed)
 Left vm for Jerry [name] stating again that we will go over these labs at the appt scheduled for 01/31/2025 at 1120am. Dr. Katrinka is out of the office and has not yet been able to review labs done in the ER, this will need to be done at an in person appt that was scheduled last week with Jerry [name].   Copied from CRM [id number]. Topic: Clinical - Lab/Test Results >> Jan 21, 2025  5:02 PM Alfonso ORN wrote: Reason for CRM: pt daughter called for lab results. Advised her that results were not yet reviewed by provider. Please call daughter with update. [phone]

## 2025-01-22 NOTE — Telephone Encounter (Signed)
 Attempted to contact patient's daughter x 2 to discuss symptoms; LVM to return call, Will attempt to contact patient at a later time to further discuss concerns.           Copied from CRM #[id number]. Topic: Clinical - Medical Advice >> Jan 22, 2025  9:40 AM Avram MATSU wrote: Reason for CRM: daughter would like know what medication father he can take. Patient has chest congestion with a slight/mild cough. Please advise [phone]

## 2025-01-22 NOTE — Telephone Encounter (Signed)
 Okay for patient to take otc mucinex? Patient scheduled for hospital follow up next Friday.

## 2025-01-22 NOTE — Telephone Encounter (Signed)
 FYI Only or Action Required?: Action required by provider: clinical question for provider.  Patient was last seen in primary care on 01/03/2025 by Katrinka Garnette KIDD, MD.  Called Nurse Triage reporting Cough.  Symptoms began several days ago.  Interventions attempted: Nothing.  Symptoms are: stable.  Triage Disposition: Home Care  Patient/caregiver understands and will follow disposition?: Yes  Reason for Disposition  Cough  Answer Assessment - Initial Assessment Questions Pt's daughter Meade called in today. Advised home care for symptoms, daughter is wondering what OTC medications the provider recommends for the patient and if he can take Mucinex DM. Please advise.   1. ONSET: When did the cough begin?      Pt's daughter unsure, he transitioned from a care home to home with his daughter.   2. SEVERITY: How bad is the cough today?      Light cough, can hear chest congestion  3. SPUTUM: Describe the color of your sputum (e.g., none, dry cough; clear, white, yellow, green)     Light yellow  4. HEMOPTYSIS: Are you coughing up any blood? If Yes, ask: How much? (e.g., flecks, streaks, tablespoons, etc.)     Denies  5. DIFFICULTY BREATHING: Are you having difficulty breathing? If Yes, ask: How bad is it? (e.g., mild, moderate, severe)      Denies  6. FEVER: Do you have a fever? If Yes, ask: What is your temperature, how was it measured, and when did it start?     Denies  7. CARDIAC HISTORY: Do you have any history of heart disease? (e.g., heart attack, congestive heart failure)      Hypertension  8. LUNG HISTORY: Do you have any history of lung disease?  (e.g., pulmonary embolus, asthma, emphysema)     Emphysema   9. OTHER SYMPTOMS: Do you have any other symptoms? (e.g., runny nose, wheezing, chest pain)       Denies  Protocols used: Cough - Acute Productive-A-AH

## 2025-01-22 NOTE — Telephone Encounter (Addendum)
 Attempted to contact pt's daughter but NA. LM for her to return call to the clinic to speak to staff. Will place in call backs at this time.     Copied from CRM #[id number]. Topic: Clinical - Medical Advice >> Jan 22, 2025  9:40 AM Avram MATSU wrote: Reason for CRM: daughter would like know what medication father he can take. Patient has chest congestion with a slight/mild cough. Please advise [phone]

## 2025-01-23 NOTE — Telephone Encounter (Signed)
 I appreciate hospital follow-up being scheduled

## 2025-01-31 ENCOUNTER — Ambulatory Visit: Payer: Self-pay

## 2025-01-31 ENCOUNTER — Other Ambulatory Visit: Payer: Self-pay

## 2025-01-31 ENCOUNTER — Inpatient Hospital Stay: Admitting: Family Medicine

## 2025-01-31 DIAGNOSIS — R413 Other amnesia: Secondary | ICD-10-CM

## 2025-01-31 MED ORDER — BUSPIRONE HCL 5 MG PO TABS: 5.0000 mg | ORAL_TABLET | Freq: Two times a day (BID) | ORAL | 5 refills | Status: AC

## 2025-01-31 MED ORDER — AMLODIPINE-OLMESARTAN 5-40 MG PO TABS: 1.0000 | ORAL_TABLET | Freq: Every day | ORAL | 3 refills | Status: AC

## 2025-01-31 MED ORDER — DONEPEZIL HCL 10 MG PO TABS: 10.0000 mg | ORAL_TABLET | Freq: Every day | ORAL | 99 refills | Status: AC

## 2025-01-31 NOTE — Telephone Encounter (Signed)
 Copied from CRM [id number]. Topic: Clinical - Prescription Issue >> Jan 31, 2025  5:33 PM Alfonso ORN wrote: Reason for CRM:  pt daughter insists on speaking to after hours nurse regarding rx request . it was sent today but to the incorrect pharmacy it is showing on the [facility]ill [facility]. i have advised that i could connect her if pt is having symptoms but she would like to speak to someone still. Was advised to contact NT.  Pt disconnected while on hold

## 2025-01-31 NOTE — Telephone Encounter (Signed)
 Medication sent to Pharmacy .     Copied from CRM #[id number]. Topic: Clinical - Medication Question >> Jan 31, 2025  3:14 PM Jerry [name] wrote: Reason for CRM: Patients daughter called in requesting refills that were sent today is needed urgently. Patient is out.

## 2025-01-31 NOTE — Telephone Encounter (Signed)
 FYI Only or Action Required?: FYI only for provider: advised urgent care or ED if becomes symptomatic so does not run out of medication this weekend.  Patient was last seen in primary care on 01/03/2025 by Katrinka Garnette KIDD, MD.  Called Nurse Triage reporting Medication Problem.  Triage Disposition: Call PCP Now-- Go to Urgent Care or ED if symptomatic due to after hours status  Patient/caregiver understands and will follow disposition?: Yes     Copied from CRM #[id number]. Topic: Clinical - Medication Question >> Jan 31, 2025  5:43 PM Charolett L wrote: Reason for CRM: Patient daughter is calling in and stated that the medication listed below is needed for a 3 day supply because the pharmacy it was sent to is closed until Monday. Patient requesting to speak with a nurse about the side effects if medication isn't taken amLODipine -olmesartan  (AZOR ) 5-40 MG tablet busPIRone  (BUSPAR ) 5 MG tablet donepezil  (ARICEPT ) 10 MG tablet Transferred to CAL   Reason for Disposition  [1] Prescription not at pharmacy AND [2] was prescribed by doctor (or NP/PA) recently  (Exception: [facility] has access to EMR and prescription is recorded there. Go to Home Care and confirm prescription for pharmacy.)  Answer Assessment - Initial Assessment Questions Refill request was sent to wrong pharmacy per daughter, medications needed to go to [facility] [address] but were sent to other location that has different hours now and is closed.   Daughter concerned for withdrawal symptoms over weekend and not having medications on board to manage conditions.  Protocols used: Medication Refill and Renewal Call-A-AH

## 2025-01-31 NOTE — Telephone Encounter (Signed)
 Source  Jerry [name] (Patient)   Subject  Jerry [name] (Patient)   Topic  Clinical - Medication Refill    Communication  Medication: busPIRone  (BUSPAR ) 5 MG tablet-    amLODipine -olmesartan  (AZOR ) 5-40 MG tablet    donepezil  (ARICEPT ) 10 MG tablet        Has the patient contacted their pharmacy? No    (Agent: If no, request that the patient contact the pharmacy for the refill. If patient does not wish to contact the pharmacy document the reason why and proceed with request.)    (Agent: If yes, when and what did the pharmacy advise?)        This is the [facility] [address]        Is this the correct pharmacy for this prescription? Yes    If no, delete pharmacy and type the correct one.        Has the prescription been filled recently? No        Is the patient out of the medication? Yes        Has the patient been seen for an appointment in the last year OR does the patient have an upcoming appointment? Yes        Can we respond through MyChart? Yes      Agent: Please be advised that Rx refills may take up to 3 business days. We ask that you follow-up with your pharmacy.

## 2025-02-06 ENCOUNTER — Inpatient Hospital Stay: Admitting: Family Medicine

## 2025-02-20 ENCOUNTER — Ambulatory Visit: Admitting: Family Medicine
